# Patient Record
Sex: Female | Born: 1945 | Race: White | Hispanic: No | Marital: Single | Smoking: Never smoker
Health system: Southern US, Community
[De-identification: ages and names within clinical notes are randomized; demographics above are authoritative.]

## PROBLEM LIST (undated history)

## (undated) DIAGNOSIS — N189 Chronic kidney disease, unspecified: Secondary | ICD-10-CM

## (undated) DIAGNOSIS — G709 Myoneural disorder, unspecified: Secondary | ICD-10-CM

## (undated) DIAGNOSIS — I10 Essential (primary) hypertension: Secondary | ICD-10-CM

## (undated) DIAGNOSIS — K219 Gastro-esophageal reflux disease without esophagitis: Secondary | ICD-10-CM

## (undated) DIAGNOSIS — E119 Type 2 diabetes mellitus without complications: Secondary | ICD-10-CM

## (undated) DIAGNOSIS — F32A Depression, unspecified: Secondary | ICD-10-CM

## (undated) HISTORY — PX: CHOLECYSTECTOMY: SHX55

## (undated) HISTORY — PX: TONSILLECTOMY: SUR1361

## (undated) HISTORY — PX: ABDOMINAL HYSTERECTOMY: SHX81

## (undated) HISTORY — PX: DILATION AND CURETTAGE OF UTERUS: SHX78

---

## 1988-10-12 HISTORY — PX: ABDOMINAL HYSTERECTOMY: SHX81

## 2012-02-26 DIAGNOSIS — R072 Precordial pain: Secondary | ICD-10-CM

## 2015-10-22 DIAGNOSIS — K210 Gastro-esophageal reflux disease with esophagitis: Secondary | ICD-10-CM | POA: Diagnosis not present

## 2015-10-22 DIAGNOSIS — N183 Chronic kidney disease, stage 3 (moderate): Secondary | ICD-10-CM | POA: Diagnosis not present

## 2015-10-22 DIAGNOSIS — E1122 Type 2 diabetes mellitus with diabetic chronic kidney disease: Secondary | ICD-10-CM | POA: Diagnosis not present

## 2015-10-22 DIAGNOSIS — E782 Mixed hyperlipidemia: Secondary | ICD-10-CM | POA: Diagnosis not present

## 2015-10-22 DIAGNOSIS — F900 Attention-deficit hyperactivity disorder, predominantly inattentive type: Secondary | ICD-10-CM | POA: Diagnosis not present

## 2015-10-28 DIAGNOSIS — F331 Major depressive disorder, recurrent, moderate: Secondary | ICD-10-CM | POA: Diagnosis not present

## 2015-10-28 DIAGNOSIS — F900 Attention-deficit hyperactivity disorder, predominantly inattentive type: Secondary | ICD-10-CM | POA: Diagnosis not present

## 2015-10-28 DIAGNOSIS — Z9189 Other specified personal risk factors, not elsewhere classified: Secondary | ICD-10-CM | POA: Diagnosis not present

## 2015-10-28 DIAGNOSIS — M797 Fibromyalgia: Secondary | ICD-10-CM | POA: Diagnosis not present

## 2015-10-28 DIAGNOSIS — Z1389 Encounter for screening for other disorder: Secondary | ICD-10-CM | POA: Diagnosis not present

## 2015-10-28 DIAGNOSIS — E6609 Other obesity due to excess calories: Secondary | ICD-10-CM | POA: Diagnosis not present

## 2015-10-28 DIAGNOSIS — N183 Chronic kidney disease, stage 3 (moderate): Secondary | ICD-10-CM | POA: Diagnosis not present

## 2015-10-28 DIAGNOSIS — K219 Gastro-esophageal reflux disease without esophagitis: Secondary | ICD-10-CM | POA: Diagnosis not present

## 2015-10-28 DIAGNOSIS — E1122 Type 2 diabetes mellitus with diabetic chronic kidney disease: Secondary | ICD-10-CM | POA: Diagnosis not present

## 2016-01-22 DIAGNOSIS — K219 Gastro-esophageal reflux disease without esophagitis: Secondary | ICD-10-CM | POA: Diagnosis not present

## 2016-01-22 DIAGNOSIS — F331 Major depressive disorder, recurrent, moderate: Secondary | ICD-10-CM | POA: Diagnosis not present

## 2016-01-22 DIAGNOSIS — R0781 Pleurodynia: Secondary | ICD-10-CM | POA: Diagnosis not present

## 2016-01-22 DIAGNOSIS — M797 Fibromyalgia: Secondary | ICD-10-CM | POA: Diagnosis not present

## 2016-01-29 DIAGNOSIS — L03311 Cellulitis of abdominal wall: Secondary | ICD-10-CM | POA: Diagnosis not present

## 2016-01-29 DIAGNOSIS — F331 Major depressive disorder, recurrent, moderate: Secondary | ICD-10-CM | POA: Diagnosis not present

## 2016-01-29 DIAGNOSIS — S76011A Strain of muscle, fascia and tendon of right hip, initial encounter: Secondary | ICD-10-CM | POA: Diagnosis not present

## 2016-03-02 DIAGNOSIS — E1122 Type 2 diabetes mellitus with diabetic chronic kidney disease: Secondary | ICD-10-CM | POA: Diagnosis not present

## 2016-03-02 DIAGNOSIS — F900 Attention-deficit hyperactivity disorder, predominantly inattentive type: Secondary | ICD-10-CM | POA: Diagnosis not present

## 2016-03-02 DIAGNOSIS — E782 Mixed hyperlipidemia: Secondary | ICD-10-CM | POA: Diagnosis not present

## 2016-03-02 DIAGNOSIS — N183 Chronic kidney disease, stage 3 (moderate): Secondary | ICD-10-CM | POA: Diagnosis not present

## 2016-03-02 DIAGNOSIS — K210 Gastro-esophageal reflux disease with esophagitis: Secondary | ICD-10-CM | POA: Diagnosis not present

## 2016-03-05 DIAGNOSIS — E6609 Other obesity due to excess calories: Secondary | ICD-10-CM | POA: Diagnosis not present

## 2016-03-05 DIAGNOSIS — E1122 Type 2 diabetes mellitus with diabetic chronic kidney disease: Secondary | ICD-10-CM | POA: Diagnosis not present

## 2016-03-05 DIAGNOSIS — Z9189 Other specified personal risk factors, not elsewhere classified: Secondary | ICD-10-CM | POA: Diagnosis not present

## 2016-03-05 DIAGNOSIS — K219 Gastro-esophageal reflux disease without esophagitis: Secondary | ICD-10-CM | POA: Diagnosis not present

## 2016-03-05 DIAGNOSIS — F331 Major depressive disorder, recurrent, moderate: Secondary | ICD-10-CM | POA: Diagnosis not present

## 2016-03-05 DIAGNOSIS — M797 Fibromyalgia: Secondary | ICD-10-CM | POA: Diagnosis not present

## 2016-03-05 DIAGNOSIS — N183 Chronic kidney disease, stage 3 (moderate): Secondary | ICD-10-CM | POA: Diagnosis not present

## 2016-03-05 DIAGNOSIS — F900 Attention-deficit hyperactivity disorder, predominantly inattentive type: Secondary | ICD-10-CM | POA: Diagnosis not present

## 2016-04-23 DIAGNOSIS — M25522 Pain in left elbow: Secondary | ICD-10-CM | POA: Diagnosis not present

## 2016-04-23 DIAGNOSIS — M4692 Unspecified inflammatory spondylopathy, cervical region: Secondary | ICD-10-CM | POA: Diagnosis not present

## 2016-04-23 DIAGNOSIS — M4722 Other spondylosis with radiculopathy, cervical region: Secondary | ICD-10-CM | POA: Diagnosis not present

## 2016-04-23 DIAGNOSIS — M7061 Trochanteric bursitis, right hip: Secondary | ICD-10-CM | POA: Diagnosis not present

## 2016-04-23 DIAGNOSIS — M542 Cervicalgia: Secondary | ICD-10-CM | POA: Diagnosis not present

## 2016-06-11 DIAGNOSIS — Z961 Presence of intraocular lens: Secondary | ICD-10-CM | POA: Diagnosis not present

## 2016-06-11 DIAGNOSIS — E119 Type 2 diabetes mellitus without complications: Secondary | ICD-10-CM | POA: Diagnosis not present

## 2016-06-11 DIAGNOSIS — H538 Other visual disturbances: Secondary | ICD-10-CM | POA: Diagnosis not present

## 2016-07-06 DIAGNOSIS — M797 Fibromyalgia: Secondary | ICD-10-CM | POA: Diagnosis not present

## 2016-07-06 DIAGNOSIS — K210 Gastro-esophageal reflux disease with esophagitis: Secondary | ICD-10-CM | POA: Diagnosis not present

## 2016-07-06 DIAGNOSIS — E782 Mixed hyperlipidemia: Secondary | ICD-10-CM | POA: Diagnosis not present

## 2016-07-06 DIAGNOSIS — E1122 Type 2 diabetes mellitus with diabetic chronic kidney disease: Secondary | ICD-10-CM | POA: Diagnosis not present

## 2016-07-06 DIAGNOSIS — F900 Attention-deficit hyperactivity disorder, predominantly inattentive type: Secondary | ICD-10-CM | POA: Diagnosis not present

## 2016-07-08 DIAGNOSIS — Z6829 Body mass index (BMI) 29.0-29.9, adult: Secondary | ICD-10-CM | POA: Diagnosis not present

## 2016-07-08 DIAGNOSIS — Z1212 Encounter for screening for malignant neoplasm of rectum: Secondary | ICD-10-CM | POA: Diagnosis not present

## 2016-07-08 DIAGNOSIS — E782 Mixed hyperlipidemia: Secondary | ICD-10-CM | POA: Diagnosis not present

## 2016-07-08 DIAGNOSIS — E6609 Other obesity due to excess calories: Secondary | ICD-10-CM | POA: Diagnosis not present

## 2016-07-08 DIAGNOSIS — Z9189 Other specified personal risk factors, not elsewhere classified: Secondary | ICD-10-CM | POA: Diagnosis not present

## 2016-07-08 DIAGNOSIS — F331 Major depressive disorder, recurrent, moderate: Secondary | ICD-10-CM | POA: Diagnosis not present

## 2016-07-08 DIAGNOSIS — I10 Essential (primary) hypertension: Secondary | ICD-10-CM | POA: Diagnosis not present

## 2016-07-08 DIAGNOSIS — Z0001 Encounter for general adult medical examination with abnormal findings: Secondary | ICD-10-CM | POA: Diagnosis not present

## 2016-07-08 DIAGNOSIS — M797 Fibromyalgia: Secondary | ICD-10-CM | POA: Diagnosis not present

## 2016-07-08 DIAGNOSIS — F900 Attention-deficit hyperactivity disorder, predominantly inattentive type: Secondary | ICD-10-CM | POA: Diagnosis not present

## 2016-07-08 DIAGNOSIS — N183 Chronic kidney disease, stage 3 (moderate): Secondary | ICD-10-CM | POA: Diagnosis not present

## 2016-07-08 DIAGNOSIS — Z23 Encounter for immunization: Secondary | ICD-10-CM | POA: Diagnosis not present

## 2016-07-08 DIAGNOSIS — E1122 Type 2 diabetes mellitus with diabetic chronic kidney disease: Secondary | ICD-10-CM | POA: Diagnosis not present

## 2016-07-08 DIAGNOSIS — K219 Gastro-esophageal reflux disease without esophagitis: Secondary | ICD-10-CM | POA: Diagnosis not present

## 2016-07-16 DIAGNOSIS — Z1231 Encounter for screening mammogram for malignant neoplasm of breast: Secondary | ICD-10-CM | POA: Diagnosis not present

## 2016-11-09 DIAGNOSIS — N183 Chronic kidney disease, stage 3 (moderate): Secondary | ICD-10-CM | POA: Diagnosis not present

## 2016-11-09 DIAGNOSIS — E782 Mixed hyperlipidemia: Secondary | ICD-10-CM | POA: Diagnosis not present

## 2016-11-09 DIAGNOSIS — K210 Gastro-esophageal reflux disease with esophagitis: Secondary | ICD-10-CM | POA: Diagnosis not present

## 2016-11-09 DIAGNOSIS — E1165 Type 2 diabetes mellitus with hyperglycemia: Secondary | ICD-10-CM | POA: Diagnosis not present

## 2016-11-09 DIAGNOSIS — I10 Essential (primary) hypertension: Secondary | ICD-10-CM | POA: Diagnosis not present

## 2016-11-11 DIAGNOSIS — E1122 Type 2 diabetes mellitus with diabetic chronic kidney disease: Secondary | ICD-10-CM | POA: Diagnosis not present

## 2016-11-11 DIAGNOSIS — M797 Fibromyalgia: Secondary | ICD-10-CM | POA: Diagnosis not present

## 2016-11-11 DIAGNOSIS — E6609 Other obesity due to excess calories: Secondary | ICD-10-CM | POA: Diagnosis not present

## 2016-11-11 DIAGNOSIS — F331 Major depressive disorder, recurrent, moderate: Secondary | ICD-10-CM | POA: Diagnosis not present

## 2016-11-11 DIAGNOSIS — Z6829 Body mass index (BMI) 29.0-29.9, adult: Secondary | ICD-10-CM | POA: Diagnosis not present

## 2016-11-11 DIAGNOSIS — K219 Gastro-esophageal reflux disease without esophagitis: Secondary | ICD-10-CM | POA: Diagnosis not present

## 2016-11-11 DIAGNOSIS — N183 Chronic kidney disease, stage 3 (moderate): Secondary | ICD-10-CM | POA: Diagnosis not present

## 2016-11-11 DIAGNOSIS — F900 Attention-deficit hyperactivity disorder, predominantly inattentive type: Secondary | ICD-10-CM | POA: Diagnosis not present

## 2017-03-03 DIAGNOSIS — E1165 Type 2 diabetes mellitus with hyperglycemia: Secondary | ICD-10-CM | POA: Diagnosis not present

## 2017-03-03 DIAGNOSIS — F900 Attention-deficit hyperactivity disorder, predominantly inattentive type: Secondary | ICD-10-CM | POA: Diagnosis not present

## 2017-03-03 DIAGNOSIS — E782 Mixed hyperlipidemia: Secondary | ICD-10-CM | POA: Diagnosis not present

## 2017-03-03 DIAGNOSIS — F331 Major depressive disorder, recurrent, moderate: Secondary | ICD-10-CM | POA: Diagnosis not present

## 2017-03-03 DIAGNOSIS — Z9189 Other specified personal risk factors, not elsewhere classified: Secondary | ICD-10-CM | POA: Diagnosis not present

## 2017-03-03 DIAGNOSIS — K210 Gastro-esophageal reflux disease with esophagitis: Secondary | ICD-10-CM | POA: Diagnosis not present

## 2017-03-03 DIAGNOSIS — E1122 Type 2 diabetes mellitus with diabetic chronic kidney disease: Secondary | ICD-10-CM | POA: Diagnosis not present

## 2017-03-03 DIAGNOSIS — I10 Essential (primary) hypertension: Secondary | ICD-10-CM | POA: Diagnosis not present

## 2017-03-09 DIAGNOSIS — N183 Chronic kidney disease, stage 3 (moderate): Secondary | ICD-10-CM | POA: Diagnosis not present

## 2017-03-09 DIAGNOSIS — F331 Major depressive disorder, recurrent, moderate: Secondary | ICD-10-CM | POA: Diagnosis not present

## 2017-03-09 DIAGNOSIS — Z6830 Body mass index (BMI) 30.0-30.9, adult: Secondary | ICD-10-CM | POA: Diagnosis not present

## 2017-03-09 DIAGNOSIS — I10 Essential (primary) hypertension: Secondary | ICD-10-CM | POA: Diagnosis not present

## 2017-03-09 DIAGNOSIS — E782 Mixed hyperlipidemia: Secondary | ICD-10-CM | POA: Diagnosis not present

## 2017-03-09 DIAGNOSIS — M797 Fibromyalgia: Secondary | ICD-10-CM | POA: Diagnosis not present

## 2017-03-09 DIAGNOSIS — E1122 Type 2 diabetes mellitus with diabetic chronic kidney disease: Secondary | ICD-10-CM | POA: Diagnosis not present

## 2017-03-09 DIAGNOSIS — F900 Attention-deficit hyperactivity disorder, predominantly inattentive type: Secondary | ICD-10-CM | POA: Diagnosis not present

## 2017-03-25 DIAGNOSIS — Z6828 Body mass index (BMI) 28.0-28.9, adult: Secondary | ICD-10-CM | POA: Diagnosis not present

## 2017-03-25 DIAGNOSIS — L03311 Cellulitis of abdominal wall: Secondary | ICD-10-CM | POA: Diagnosis not present

## 2017-03-25 DIAGNOSIS — I10 Essential (primary) hypertension: Secondary | ICD-10-CM | POA: Diagnosis not present

## 2017-04-08 DIAGNOSIS — E1122 Type 2 diabetes mellitus with diabetic chronic kidney disease: Secondary | ICD-10-CM | POA: Diagnosis not present

## 2017-04-08 DIAGNOSIS — F331 Major depressive disorder, recurrent, moderate: Secondary | ICD-10-CM | POA: Diagnosis not present

## 2017-04-08 DIAGNOSIS — F900 Attention-deficit hyperactivity disorder, predominantly inattentive type: Secondary | ICD-10-CM | POA: Diagnosis not present

## 2017-04-08 DIAGNOSIS — E782 Mixed hyperlipidemia: Secondary | ICD-10-CM | POA: Diagnosis not present

## 2017-04-08 DIAGNOSIS — N183 Chronic kidney disease, stage 3 (moderate): Secondary | ICD-10-CM | POA: Diagnosis not present

## 2017-04-08 DIAGNOSIS — E1165 Type 2 diabetes mellitus with hyperglycemia: Secondary | ICD-10-CM | POA: Diagnosis not present

## 2017-04-08 DIAGNOSIS — K210 Gastro-esophageal reflux disease with esophagitis: Secondary | ICD-10-CM | POA: Diagnosis not present

## 2017-04-08 DIAGNOSIS — I10 Essential (primary) hypertension: Secondary | ICD-10-CM | POA: Diagnosis not present

## 2017-04-08 DIAGNOSIS — Z9189 Other specified personal risk factors, not elsewhere classified: Secondary | ICD-10-CM | POA: Diagnosis not present

## 2017-04-12 DIAGNOSIS — I10 Essential (primary) hypertension: Secondary | ICD-10-CM | POA: Diagnosis not present

## 2017-04-12 DIAGNOSIS — Z6829 Body mass index (BMI) 29.0-29.9, adult: Secondary | ICD-10-CM | POA: Diagnosis not present

## 2017-04-12 DIAGNOSIS — N183 Chronic kidney disease, stage 3 (moderate): Secondary | ICD-10-CM | POA: Diagnosis not present

## 2017-04-12 DIAGNOSIS — E1122 Type 2 diabetes mellitus with diabetic chronic kidney disease: Secondary | ICD-10-CM | POA: Diagnosis not present

## 2017-04-12 DIAGNOSIS — Z1212 Encounter for screening for malignant neoplasm of rectum: Secondary | ICD-10-CM | POA: Diagnosis not present

## 2017-04-12 DIAGNOSIS — M797 Fibromyalgia: Secondary | ICD-10-CM | POA: Diagnosis not present

## 2017-04-12 DIAGNOSIS — E782 Mixed hyperlipidemia: Secondary | ICD-10-CM | POA: Diagnosis not present

## 2017-04-12 DIAGNOSIS — R41840 Attention and concentration deficit: Secondary | ICD-10-CM | POA: Diagnosis not present

## 2017-07-16 DIAGNOSIS — M25551 Pain in right hip: Secondary | ICD-10-CM | POA: Diagnosis not present

## 2017-07-16 DIAGNOSIS — M545 Low back pain: Secondary | ICD-10-CM | POA: Diagnosis not present

## 2017-08-12 DIAGNOSIS — H40053 Ocular hypertension, bilateral: Secondary | ICD-10-CM | POA: Diagnosis not present

## 2017-08-13 DIAGNOSIS — E782 Mixed hyperlipidemia: Secondary | ICD-10-CM | POA: Diagnosis not present

## 2017-08-13 DIAGNOSIS — K210 Gastro-esophageal reflux disease with esophagitis: Secondary | ICD-10-CM | POA: Diagnosis not present

## 2017-08-13 DIAGNOSIS — E1165 Type 2 diabetes mellitus with hyperglycemia: Secondary | ICD-10-CM | POA: Diagnosis not present

## 2017-08-13 DIAGNOSIS — E1122 Type 2 diabetes mellitus with diabetic chronic kidney disease: Secondary | ICD-10-CM | POA: Diagnosis not present

## 2017-08-13 DIAGNOSIS — Z9189 Other specified personal risk factors, not elsewhere classified: Secondary | ICD-10-CM | POA: Diagnosis not present

## 2017-08-13 DIAGNOSIS — F900 Attention-deficit hyperactivity disorder, predominantly inattentive type: Secondary | ICD-10-CM | POA: Diagnosis not present

## 2017-08-13 DIAGNOSIS — N183 Chronic kidney disease, stage 3 (moderate): Secondary | ICD-10-CM | POA: Diagnosis not present

## 2017-08-13 DIAGNOSIS — I10 Essential (primary) hypertension: Secondary | ICD-10-CM | POA: Diagnosis not present

## 2017-08-18 DIAGNOSIS — Z0001 Encounter for general adult medical examination with abnormal findings: Secondary | ICD-10-CM | POA: Diagnosis not present

## 2017-08-18 DIAGNOSIS — N183 Chronic kidney disease, stage 3 (moderate): Secondary | ICD-10-CM | POA: Diagnosis not present

## 2017-08-18 DIAGNOSIS — E1122 Type 2 diabetes mellitus with diabetic chronic kidney disease: Secondary | ICD-10-CM | POA: Diagnosis not present

## 2017-08-18 DIAGNOSIS — Z1212 Encounter for screening for malignant neoplasm of rectum: Secondary | ICD-10-CM | POA: Diagnosis not present

## 2017-08-18 DIAGNOSIS — Z23 Encounter for immunization: Secondary | ICD-10-CM | POA: Diagnosis not present

## 2017-08-18 DIAGNOSIS — Z6830 Body mass index (BMI) 30.0-30.9, adult: Secondary | ICD-10-CM | POA: Diagnosis not present

## 2017-08-18 DIAGNOSIS — I10 Essential (primary) hypertension: Secondary | ICD-10-CM | POA: Diagnosis not present

## 2017-08-18 DIAGNOSIS — Z1389 Encounter for screening for other disorder: Secondary | ICD-10-CM | POA: Diagnosis not present

## 2017-10-27 ENCOUNTER — Other Ambulatory Visit: Payer: Self-pay

## 2017-10-27 ENCOUNTER — Emergency Department (HOSPITAL_COMMUNITY)
Admission: EM | Admit: 2017-10-27 | Discharge: 2017-10-27 | Disposition: A | Discharge status: Home / Self Care | Payer: PPO | Attending: Emergency Medicine | Admitting: Emergency Medicine

## 2017-10-27 ENCOUNTER — Emergency Department (HOSPITAL_COMMUNITY): Payer: PPO

## 2017-10-27 ENCOUNTER — Encounter (HOSPITAL_COMMUNITY): Payer: Self-pay | Admitting: Emergency Medicine

## 2017-10-27 DIAGNOSIS — E119 Type 2 diabetes mellitus without complications: Secondary | ICD-10-CM | POA: Diagnosis not present

## 2017-10-27 DIAGNOSIS — M545 Low back pain: Secondary | ICD-10-CM | POA: Diagnosis not present

## 2017-10-27 DIAGNOSIS — G8929 Other chronic pain: Secondary | ICD-10-CM | POA: Insufficient documentation

## 2017-10-27 DIAGNOSIS — M549 Dorsalgia, unspecified: Secondary | ICD-10-CM | POA: Diagnosis not present

## 2017-10-27 DIAGNOSIS — Z79899 Other long term (current) drug therapy: Secondary | ICD-10-CM | POA: Diagnosis not present

## 2017-10-27 DIAGNOSIS — Z7984 Long term (current) use of oral hypoglycemic drugs: Secondary | ICD-10-CM | POA: Diagnosis not present

## 2017-10-27 DIAGNOSIS — M5442 Lumbago with sciatica, left side: Secondary | ICD-10-CM | POA: Insufficient documentation

## 2017-10-27 DIAGNOSIS — I10 Essential (primary) hypertension: Secondary | ICD-10-CM | POA: Insufficient documentation

## 2017-10-27 DIAGNOSIS — M25552 Pain in left hip: Secondary | ICD-10-CM | POA: Diagnosis not present

## 2017-10-27 HISTORY — DX: Essential (primary) hypertension: I10

## 2017-10-27 HISTORY — DX: Type 2 diabetes mellitus without complications: E11.9

## 2017-10-27 LAB — CBG MONITORING, ED: Glucose-Capillary: 87 mg/dL (ref 65–99)

## 2017-10-27 MED ORDER — HYDROCODONE-ACETAMINOPHEN 5-325 MG PO TABS
1.0000 | ORAL_TABLET | Freq: Once | ORAL | Status: AC
  Administered 2017-10-27: 1 via ORAL
  Filled 2017-10-27: qty 1

## 2017-10-27 MED ORDER — ONDANSETRON 4 MG PO TBDP
4.0000 mg | ORAL_TABLET | Freq: Once | ORAL | Status: AC
  Administered 2017-10-27: 4 mg via ORAL
  Filled 2017-10-27: qty 1

## 2017-10-27 MED ORDER — CYCLOBENZAPRINE HCL 10 MG PO TABS: 10.0000 mg | ORAL_TABLET | Freq: Two times a day (BID) | ORAL | 0 refills | Status: DC | PRN

## 2017-10-27 MED ORDER — HYDROMORPHONE HCL 1 MG/ML IJ SOLN
0.5000 mg | Freq: Once | INTRAMUSCULAR | Status: AC
  Administered 2017-10-27: 0.5 mg via INTRAMUSCULAR
  Filled 2017-10-27: qty 1

## 2017-10-27 MED ORDER — OXYCODONE-ACETAMINOPHEN 5-325 MG PO TABS: 1.0000 | ORAL_TABLET | Freq: Four times a day (QID) | ORAL | 0 refills | Status: DC | PRN

## 2017-10-27 MED ORDER — HYDROMORPHONE HCL 1 MG/ML IJ SOLN: 1.0000 mg | Freq: Once | INTRAMUSCULAR | Status: DC

## 2017-10-27 NOTE — ED Triage Notes (Signed)
Patient reports L hip, back and leg pain that started 2 days ago. Patient has a history of back and hip pain. No known injury.

## 2017-10-27 NOTE — ED Provider Notes (Signed)
[facility] EMERGENCY DEPARTMENT Provider Note   CSN: [id number] Arrival date & time: 10/27/17  1259     History   Chief Complaint Chief Complaint  Patient presents with  . Back Pain    HPI Erica Cervantes is a 72 y.o. female with DM, htn and occasional low back pain with diagnosis of scoliosis presenting with severe pain in her left lower back with radiation of tingling sensation to her left toes.  She describes simply standing at the kitchen sink washing dishes 2 days ago when she had severe sharp stabbing pain which has not improved. She has been attempting to ambulate around her home bending forward resting on an old walker as she  is unable to stand straight without severe pain and sitting also is very painful, her only comfortable position currently is lying down.  She denies weakness in her legs and has no urinary or fecal incontinence or retention.  She reports seeing Dr. French Ana in [place] several months ago with less intense pain than here and had a steroid injection which relieved her pain until now.   The history is provided by the patient and a relative.    Past Medical History:  Diagnosis Date  . Diabetes mellitus without complication ([place])   . Hypertension     There are no active problems to display for this patient.   Past Surgical History:  Procedure Laterality Date  . ABDOMINAL HYSTERECTOMY    . CHOLECYSTECTOMY    . TONSILLECTOMY      OB History    Gravida Para Term Preterm AB Living   2 2 2          SAB TAB Ectopic Multiple Live Births                   Home Medications    Prior to Admission medications   Medication Sig Start Date End Date Taking? Authorizing Provider  acetaminophen (TYLENOL) 500 MG tablet Take 500 mg by mouth once as needed for mild pain or moderate pain.   Yes [provider]  amphetamine-dextroamphetamine (ADDERALL) 20 MG tablet Take 20 mg by mouth daily. 10/26/17  Yes [provider]  busPIRone (BUSPAR) 30 MG  tablet Take 30 mg by mouth 3 (three) times daily. 08/23/17  Yes [provider]  DULoxetine (CYMBALTA) 60 MG capsule Take 60 mg by mouth daily. 09/25/17  Yes [provider]  folic acid (FOLVITE) 967 MCG tablet Take 400 mcg by mouth daily.   Yes [provider]  glucosamine-chondroitin 500-400 MG tablet Take 1 tablet by mouth 3 (three) times daily.   Yes [provider]  lisinopril-hydrochlorothiazide (PRINZIDE,ZESTORETIC) 20-25 MG tablet Take 0.5 tablets by mouth daily.  09/11/17  Yes [provider]  meloxicam (MOBIC) 15 MG tablet Take 15 mg by mouth daily. 08/10/17  Yes [provider]  metFORMIN (GLUCOPHAGE-XR) 500 MG 24 hr tablet Take 1,000 mg by mouth 2 (two) times daily. 09/11/17  Yes [provider]  Multiple Vitamin (MULTIVITAMIN WITH MINERALS) TABS tablet Take 1 tablet by mouth daily.   Yes [provider]  Multiple Vitamins-Minerals (HAIR/SKIN/NAILS/BIOTIN PO) Take 1 tablet by mouth daily.   Yes [provider]  ranitidine (ZANTAC) 150 MG tablet Take 150 mg by mouth 2 (two) times daily. 09/13/17  Yes [provider]  vitamin C (ASCORBIC ACID) 500 MG tablet Take 500 mg by mouth daily.   Yes [provider]    Family History Family History  Problem  Relation Age of Onset  . Cancer Mother   . Cancer Father   . Heart attack Brother   . Polycystic kidney disease Brother   . Cancer Other     Social History Social History   Tobacco Use  . Smoking status: Never Smoker  . Smokeless tobacco: Never Used  Substance Use Topics  . Alcohol use: No    Frequency: Never  . Drug use: No     Allergies   Sulfa antibiotics; Codeine; Penicillins; Statins; and Neosporin [neomycin-bacitracin zn-polymyx]   Review of Systems Review of Systems  Constitutional: Negative for fever.  Respiratory: Negative for shortness of breath.   Cardiovascular: Negative for chest pain and leg swelling.    Gastrointestinal: Negative for abdominal distention, abdominal pain and constipation.  Genitourinary: Negative for difficulty urinating, dysuria, flank pain, frequency and urgency.  Musculoskeletal: Positive for back pain. Negative for gait problem and joint swelling.  Skin: Negative for rash.  Neurological: Negative for weakness and numbness.       Tingling per Hpi in the left leg.     Physical Exam Updated Vital Signs BP 139/76 (BP Location: Right Arm)   Pulse 73   Temp 97.8 F (36.6 C) (Oral)   Resp 18   Ht 5\' 3"  (1.6 m)   Wt 77.1 kg (170 lb)   SpO2 98%   BMI 30.11 kg/m   Physical Exam  Constitutional: She appears well-developed and well-nourished.  HENT:  Head: Normocephalic.  Eyes: Conjunctivae are normal.  Neck: Normal range of motion. Neck supple.  Cardiovascular: Normal rate and intact distal pulses.  Pedal pulses normal.  Pulmonary/Chest: Effort normal.  Abdominal: Soft. Bowel sounds are normal. She exhibits no distension and no mass.  Musculoskeletal: Normal range of motion. She exhibits no edema.       Lumbar back: She exhibits tenderness. She exhibits no swelling, no edema and no spasm.  Neurological: She is alert. She has normal strength. She displays no atrophy and no tremor. No sensory deficit. Gait normal.  No strength deficit noted in hip and knee flexor and extensor muscle groups.  Ankle flexion and extension intact. Pt able to SLR bilaterally with moderate left posterior back pain.  Sensation to sharp touch in ankles and feet normal, light touch reduced left dorsal foot. Unable to access dtr's , pt unable to sit secondary to pain.  Skin: Skin is warm and dry.  Psychiatric: She has a normal mood and affect.  Nursing note and vitals reviewed.    ED Treatments / Results  Labs (all labs ordered are listed, but only abnormal results are displayed) Labs Reviewed  CBG MONITORING, ED    EKG  EKG Interpretation None       Radiology Dg Lumbar Spine  Complete  Result Date: 10/27/2017 CLINICAL DATA:  Low back pain EXAM: LUMBAR SPINE - COMPLETE 4+ VIEW COMPARISON:  None. FINDINGS: Surgical clips in the right upper quadrant. Five non rib-bearing lumbar type vertebra. Trace anterolisthesis of L5 on S1. Moderate degenerative changes at L3-L4, L4-L5 and L5-S1. Vertebral body heights are within normal limits. IMPRESSION: Moderate degenerative changes.  No acute osseous abnormality. Electronically Signed   By: Donavan Foil M.D.   On: 10/27/2017 17:00   Dg Hip Unilat W Or Wo Pelvis 2-3 Views Left  Result Date: 10/27/2017 CLINICAL DATA:  Left hip pain EXAM: DG HIP (WITH OR WITHOUT PELVIS) 2-3V LEFT COMPARISON:  None. FINDINGS: No fracture or malalignment. Pubic symphysis and rami are intact. Mild degenerative changes. Calcified  pelvic phleboliths IMPRESSION: No acute osseous abnormality.  Mild degenerative changes. Electronically Signed   By: Donavan Foil M.D.   On: 10/27/2017 17:01    Procedures Procedures (including critical care time)  Medications Ordered in ED Medications  HYDROcodone-acetaminophen (NORCO/VICODIN) 5-325 MG per tablet 1 tablet (1 tablet Oral Given 10/27/17 1629)  HYDROmorphone (DILAUDID) injection 0.5 mg (0.5 mg Intramuscular Given 10/27/17 1757)  ondansetron (ZOFRAN-ODT) disintegrating tablet 4 mg (4 mg Oral Given 10/27/17 1756)     Initial Impression / Assessment and Plan / ED Course  I have reviewed the triage vital signs and the nursing notes.  Pertinent labs & imaging results that were available during my care of the patient were reviewed by me and considered in my medical decision making (see chart for details).     Pt was given hydrocodone prior to plain film imaging. No improvement in pain and still unable to sit or stand.  She was given dilaudid 0.5 mg IM.  Will plan MRI to further eval for possible lumbar herniation.    Discussed with PA Janetta Hora who will follow and dispo pt.   Final Clinical Impressions(s) /  ED Diagnoses   Final diagnoses:  Chronic left-sided low back pain with left-sided sciatica    ED Discharge Orders    None       Landis Martins 10/27/17 1818    Milton Ferguson, MD 10/27/17 [phone]

## 2017-10-27 NOTE — Discharge Instructions (Signed)
Take pain medicine as needed.  Take muscle relaxer at bedtime to help you sleep. This medicine makes you drowsy so do not take before driving or work. Do not take with pain medicine Use a heating pad for sore muscles - use for 20 minutes several times a day A referral to neurosurgery has been provided Return for worsening symptoms

## 2017-11-01 DIAGNOSIS — M545 Low back pain: Secondary | ICD-10-CM | POA: Diagnosis not present

## 2017-11-09 DIAGNOSIS — M48062 Spinal stenosis, lumbar region with neurogenic claudication: Secondary | ICD-10-CM | POA: Diagnosis not present

## 2017-11-09 DIAGNOSIS — M4317 Spondylolisthesis, lumbosacral region: Secondary | ICD-10-CM | POA: Diagnosis not present

## 2017-11-09 DIAGNOSIS — Z6829 Body mass index (BMI) 29.0-29.9, adult: Secondary | ICD-10-CM | POA: Diagnosis not present

## 2017-11-09 DIAGNOSIS — M4150 Other secondary scoliosis, site unspecified: Secondary | ICD-10-CM | POA: Diagnosis not present

## 2017-11-23 DIAGNOSIS — M48062 Spinal stenosis, lumbar region with neurogenic claudication: Secondary | ICD-10-CM | POA: Diagnosis not present

## 2017-11-23 DIAGNOSIS — I10 Essential (primary) hypertension: Secondary | ICD-10-CM | POA: Diagnosis not present

## 2017-11-23 DIAGNOSIS — Z6830 Body mass index (BMI) 30.0-30.9, adult: Secondary | ICD-10-CM | POA: Diagnosis not present

## 2017-12-01 DIAGNOSIS — M48062 Spinal stenosis, lumbar region with neurogenic claudication: Secondary | ICD-10-CM | POA: Diagnosis not present

## 2017-12-28 DIAGNOSIS — M48062 Spinal stenosis, lumbar region with neurogenic claudication: Secondary | ICD-10-CM | POA: Diagnosis not present

## 2018-01-06 DIAGNOSIS — M48062 Spinal stenosis, lumbar region with neurogenic claudication: Secondary | ICD-10-CM | POA: Diagnosis not present

## 2018-01-19 DIAGNOSIS — I10 Essential (primary) hypertension: Secondary | ICD-10-CM | POA: Diagnosis not present

## 2018-01-19 DIAGNOSIS — M47812 Spondylosis without myelopathy or radiculopathy, cervical region: Secondary | ICD-10-CM | POA: Diagnosis not present

## 2018-01-19 DIAGNOSIS — Z6829 Body mass index (BMI) 29.0-29.9, adult: Secondary | ICD-10-CM | POA: Diagnosis not present

## 2018-04-11 DIAGNOSIS — Z0001 Encounter for general adult medical examination with abnormal findings: Secondary | ICD-10-CM | POA: Diagnosis not present

## 2018-04-11 DIAGNOSIS — I10 Essential (primary) hypertension: Secondary | ICD-10-CM | POA: Diagnosis not present

## 2018-04-11 DIAGNOSIS — N183 Chronic kidney disease, stage 3 (moderate): Secondary | ICD-10-CM | POA: Diagnosis not present

## 2018-04-11 DIAGNOSIS — E1122 Type 2 diabetes mellitus with diabetic chronic kidney disease: Secondary | ICD-10-CM | POA: Diagnosis not present

## 2018-04-11 DIAGNOSIS — E782 Mixed hyperlipidemia: Secondary | ICD-10-CM | POA: Diagnosis not present

## 2018-04-11 DIAGNOSIS — M797 Fibromyalgia: Secondary | ICD-10-CM | POA: Diagnosis not present

## 2018-04-11 DIAGNOSIS — F900 Attention-deficit hyperactivity disorder, predominantly inattentive type: Secondary | ICD-10-CM | POA: Diagnosis not present

## 2018-04-11 DIAGNOSIS — Z6829 Body mass index (BMI) 29.0-29.9, adult: Secondary | ICD-10-CM | POA: Diagnosis not present

## 2018-04-11 DIAGNOSIS — K219 Gastro-esophageal reflux disease without esophagitis: Secondary | ICD-10-CM | POA: Diagnosis not present

## 2018-05-04 DIAGNOSIS — M48062 Spinal stenosis, lumbar region with neurogenic claudication: Secondary | ICD-10-CM | POA: Diagnosis not present

## 2018-05-04 DIAGNOSIS — Z6829 Body mass index (BMI) 29.0-29.9, adult: Secondary | ICD-10-CM | POA: Diagnosis not present

## 2018-05-04 DIAGNOSIS — I10 Essential (primary) hypertension: Secondary | ICD-10-CM | POA: Diagnosis not present

## 2018-06-16 DIAGNOSIS — Z6828 Body mass index (BMI) 28.0-28.9, adult: Secondary | ICD-10-CM | POA: Diagnosis not present

## 2018-06-16 DIAGNOSIS — M48062 Spinal stenosis, lumbar region with neurogenic claudication: Secondary | ICD-10-CM | POA: Diagnosis not present

## 2018-06-16 DIAGNOSIS — I10 Essential (primary) hypertension: Secondary | ICD-10-CM | POA: Diagnosis not present

## 2018-07-18 DIAGNOSIS — E1122 Type 2 diabetes mellitus with diabetic chronic kidney disease: Secondary | ICD-10-CM | POA: Diagnosis not present

## 2018-07-18 DIAGNOSIS — E782 Mixed hyperlipidemia: Secondary | ICD-10-CM | POA: Diagnosis not present

## 2018-07-18 DIAGNOSIS — I10 Essential (primary) hypertension: Secondary | ICD-10-CM | POA: Diagnosis not present

## 2018-07-18 DIAGNOSIS — K210 Gastro-esophageal reflux disease with esophagitis: Secondary | ICD-10-CM | POA: Diagnosis not present

## 2018-07-18 DIAGNOSIS — N183 Chronic kidney disease, stage 3 (moderate): Secondary | ICD-10-CM | POA: Diagnosis not present

## 2018-07-18 DIAGNOSIS — M797 Fibromyalgia: Secondary | ICD-10-CM | POA: Diagnosis not present

## 2018-07-21 DIAGNOSIS — Z23 Encounter for immunization: Secondary | ICD-10-CM | POA: Diagnosis not present

## 2018-07-21 DIAGNOSIS — N183 Chronic kidney disease, stage 3 (moderate): Secondary | ICD-10-CM | POA: Diagnosis not present

## 2018-07-21 DIAGNOSIS — F900 Attention-deficit hyperactivity disorder, predominantly inattentive type: Secondary | ICD-10-CM | POA: Diagnosis not present

## 2018-07-21 DIAGNOSIS — E782 Mixed hyperlipidemia: Secondary | ICD-10-CM | POA: Diagnosis not present

## 2018-07-21 DIAGNOSIS — Z6829 Body mass index (BMI) 29.0-29.9, adult: Secondary | ICD-10-CM | POA: Diagnosis not present

## 2018-07-21 DIAGNOSIS — E1122 Type 2 diabetes mellitus with diabetic chronic kidney disease: Secondary | ICD-10-CM | POA: Diagnosis not present

## 2018-07-21 DIAGNOSIS — I10 Essential (primary) hypertension: Secondary | ICD-10-CM | POA: Diagnosis not present

## 2018-07-21 DIAGNOSIS — M797 Fibromyalgia: Secondary | ICD-10-CM | POA: Diagnosis not present

## 2018-08-10 DIAGNOSIS — M48062 Spinal stenosis, lumbar region with neurogenic claudication: Secondary | ICD-10-CM | POA: Diagnosis not present

## 2018-08-30 DIAGNOSIS — M25511 Pain in right shoulder: Secondary | ICD-10-CM | POA: Diagnosis not present

## 2018-08-30 DIAGNOSIS — M545 Low back pain: Secondary | ICD-10-CM | POA: Diagnosis not present

## 2018-08-30 DIAGNOSIS — Z6828 Body mass index (BMI) 28.0-28.9, adult: Secondary | ICD-10-CM | POA: Diagnosis not present

## 2018-10-10 DIAGNOSIS — I10 Essential (primary) hypertension: Secondary | ICD-10-CM | POA: Diagnosis not present

## 2018-10-10 DIAGNOSIS — E782 Mixed hyperlipidemia: Secondary | ICD-10-CM | POA: Diagnosis not present

## 2018-10-10 DIAGNOSIS — K219 Gastro-esophageal reflux disease without esophagitis: Secondary | ICD-10-CM | POA: Diagnosis not present

## 2018-10-26 DIAGNOSIS — K210 Gastro-esophageal reflux disease with esophagitis: Secondary | ICD-10-CM | POA: Diagnosis not present

## 2018-10-26 DIAGNOSIS — N183 Chronic kidney disease, stage 3 (moderate): Secondary | ICD-10-CM | POA: Diagnosis not present

## 2018-10-26 DIAGNOSIS — E1165 Type 2 diabetes mellitus with hyperglycemia: Secondary | ICD-10-CM | POA: Diagnosis not present

## 2018-10-26 DIAGNOSIS — E1122 Type 2 diabetes mellitus with diabetic chronic kidney disease: Secondary | ICD-10-CM | POA: Diagnosis not present

## 2018-10-26 DIAGNOSIS — E782 Mixed hyperlipidemia: Secondary | ICD-10-CM | POA: Diagnosis not present

## 2018-10-26 DIAGNOSIS — I10 Essential (primary) hypertension: Secondary | ICD-10-CM | POA: Diagnosis not present

## 2018-11-01 DIAGNOSIS — R413 Other amnesia: Secondary | ICD-10-CM | POA: Diagnosis not present

## 2018-11-01 DIAGNOSIS — Z6829 Body mass index (BMI) 29.0-29.9, adult: Secondary | ICD-10-CM | POA: Diagnosis not present

## 2018-11-01 DIAGNOSIS — E1122 Type 2 diabetes mellitus with diabetic chronic kidney disease: Secondary | ICD-10-CM | POA: Diagnosis not present

## 2018-11-01 DIAGNOSIS — M545 Low back pain: Secondary | ICD-10-CM | POA: Diagnosis not present

## 2018-11-01 DIAGNOSIS — E782 Mixed hyperlipidemia: Secondary | ICD-10-CM | POA: Diagnosis not present

## 2018-11-01 DIAGNOSIS — I10 Essential (primary) hypertension: Secondary | ICD-10-CM | POA: Diagnosis not present

## 2018-11-01 DIAGNOSIS — F900 Attention-deficit hyperactivity disorder, predominantly inattentive type: Secondary | ICD-10-CM | POA: Diagnosis not present

## 2018-11-01 DIAGNOSIS — K219 Gastro-esophageal reflux disease without esophagitis: Secondary | ICD-10-CM | POA: Diagnosis not present

## 2018-11-10 DIAGNOSIS — E1122 Type 2 diabetes mellitus with diabetic chronic kidney disease: Secondary | ICD-10-CM | POA: Diagnosis not present

## 2018-11-10 DIAGNOSIS — I10 Essential (primary) hypertension: Secondary | ICD-10-CM | POA: Diagnosis not present

## 2018-11-10 DIAGNOSIS — F331 Major depressive disorder, recurrent, moderate: Secondary | ICD-10-CM | POA: Diagnosis not present

## 2018-12-09 DIAGNOSIS — I10 Essential (primary) hypertension: Secondary | ICD-10-CM | POA: Diagnosis not present

## 2018-12-09 DIAGNOSIS — E1122 Type 2 diabetes mellitus with diabetic chronic kidney disease: Secondary | ICD-10-CM | POA: Diagnosis not present

## 2019-01-09 DIAGNOSIS — E1122 Type 2 diabetes mellitus with diabetic chronic kidney disease: Secondary | ICD-10-CM | POA: Diagnosis not present

## 2019-01-09 DIAGNOSIS — F331 Major depressive disorder, recurrent, moderate: Secondary | ICD-10-CM | POA: Diagnosis not present

## 2019-01-26 DIAGNOSIS — K210 Gastro-esophageal reflux disease with esophagitis: Secondary | ICD-10-CM | POA: Diagnosis not present

## 2019-01-26 DIAGNOSIS — E782 Mixed hyperlipidemia: Secondary | ICD-10-CM | POA: Diagnosis not present

## 2019-01-26 DIAGNOSIS — M797 Fibromyalgia: Secondary | ICD-10-CM | POA: Diagnosis not present

## 2019-01-26 DIAGNOSIS — I10 Essential (primary) hypertension: Secondary | ICD-10-CM | POA: Diagnosis not present

## 2019-01-26 DIAGNOSIS — N183 Chronic kidney disease, stage 3 (moderate): Secondary | ICD-10-CM | POA: Diagnosis not present

## 2019-01-26 DIAGNOSIS — E1165 Type 2 diabetes mellitus with hyperglycemia: Secondary | ICD-10-CM | POA: Diagnosis not present

## 2019-02-01 DIAGNOSIS — F331 Major depressive disorder, recurrent, moderate: Secondary | ICD-10-CM | POA: Diagnosis not present

## 2019-02-01 DIAGNOSIS — G252 Other specified forms of tremor: Secondary | ICD-10-CM | POA: Diagnosis not present

## 2019-02-01 DIAGNOSIS — I10 Essential (primary) hypertension: Secondary | ICD-10-CM | POA: Diagnosis not present

## 2019-02-01 DIAGNOSIS — E782 Mixed hyperlipidemia: Secondary | ICD-10-CM | POA: Diagnosis not present

## 2019-02-01 DIAGNOSIS — E1122 Type 2 diabetes mellitus with diabetic chronic kidney disease: Secondary | ICD-10-CM | POA: Diagnosis not present

## 2019-02-01 DIAGNOSIS — F900 Attention-deficit hyperactivity disorder, predominantly inattentive type: Secondary | ICD-10-CM | POA: Diagnosis not present

## 2019-02-01 DIAGNOSIS — L280 Lichen simplex chronicus: Secondary | ICD-10-CM | POA: Diagnosis not present

## 2019-02-01 DIAGNOSIS — R413 Other amnesia: Secondary | ICD-10-CM | POA: Diagnosis not present

## 2019-02-09 DIAGNOSIS — E782 Mixed hyperlipidemia: Secondary | ICD-10-CM | POA: Diagnosis not present

## 2019-02-09 DIAGNOSIS — F331 Major depressive disorder, recurrent, moderate: Secondary | ICD-10-CM | POA: Diagnosis not present

## 2019-02-09 DIAGNOSIS — I10 Essential (primary) hypertension: Secondary | ICD-10-CM | POA: Diagnosis not present

## 2019-03-11 DIAGNOSIS — E1122 Type 2 diabetes mellitus with diabetic chronic kidney disease: Secondary | ICD-10-CM | POA: Diagnosis not present

## 2019-03-11 DIAGNOSIS — F331 Major depressive disorder, recurrent, moderate: Secondary | ICD-10-CM | POA: Diagnosis not present

## 2019-04-11 DIAGNOSIS — I10 Essential (primary) hypertension: Secondary | ICD-10-CM | POA: Diagnosis not present

## 2019-04-11 DIAGNOSIS — E785 Hyperlipidemia, unspecified: Secondary | ICD-10-CM | POA: Diagnosis not present

## 2019-04-25 DIAGNOSIS — L280 Lichen simplex chronicus: Secondary | ICD-10-CM | POA: Diagnosis not present

## 2019-04-25 DIAGNOSIS — Z6827 Body mass index (BMI) 27.0-27.9, adult: Secondary | ICD-10-CM | POA: Diagnosis not present

## 2019-04-25 DIAGNOSIS — L03313 Cellulitis of chest wall: Secondary | ICD-10-CM | POA: Diagnosis not present

## 2019-05-12 DIAGNOSIS — I10 Essential (primary) hypertension: Secondary | ICD-10-CM | POA: Diagnosis not present

## 2019-05-12 DIAGNOSIS — E785 Hyperlipidemia, unspecified: Secondary | ICD-10-CM | POA: Diagnosis not present

## 2019-06-12 DIAGNOSIS — E1165 Type 2 diabetes mellitus with hyperglycemia: Secondary | ICD-10-CM | POA: Diagnosis not present

## 2019-06-12 DIAGNOSIS — I10 Essential (primary) hypertension: Secondary | ICD-10-CM | POA: Diagnosis not present

## 2019-06-12 DIAGNOSIS — E782 Mixed hyperlipidemia: Secondary | ICD-10-CM | POA: Diagnosis not present

## 2019-06-30 DIAGNOSIS — M797 Fibromyalgia: Secondary | ICD-10-CM | POA: Diagnosis not present

## 2019-06-30 DIAGNOSIS — K210 Gastro-esophageal reflux disease with esophagitis: Secondary | ICD-10-CM | POA: Diagnosis not present

## 2019-06-30 DIAGNOSIS — E782 Mixed hyperlipidemia: Secondary | ICD-10-CM | POA: Diagnosis not present

## 2019-06-30 DIAGNOSIS — N183 Chronic kidney disease, stage 3 (moderate): Secondary | ICD-10-CM | POA: Diagnosis not present

## 2019-06-30 DIAGNOSIS — I10 Essential (primary) hypertension: Secondary | ICD-10-CM | POA: Diagnosis not present

## 2019-06-30 DIAGNOSIS — E1122 Type 2 diabetes mellitus with diabetic chronic kidney disease: Secondary | ICD-10-CM | POA: Diagnosis not present

## 2019-07-05 DIAGNOSIS — Z1212 Encounter for screening for malignant neoplasm of rectum: Secondary | ICD-10-CM | POA: Diagnosis not present

## 2019-07-05 DIAGNOSIS — L280 Lichen simplex chronicus: Secondary | ICD-10-CM | POA: Diagnosis not present

## 2019-07-05 DIAGNOSIS — F900 Attention-deficit hyperactivity disorder, predominantly inattentive type: Secondary | ICD-10-CM | POA: Diagnosis not present

## 2019-07-05 DIAGNOSIS — E1122 Type 2 diabetes mellitus with diabetic chronic kidney disease: Secondary | ICD-10-CM | POA: Diagnosis not present

## 2019-07-05 DIAGNOSIS — Z6828 Body mass index (BMI) 28.0-28.9, adult: Secondary | ICD-10-CM | POA: Diagnosis not present

## 2019-07-05 DIAGNOSIS — F331 Major depressive disorder, recurrent, moderate: Secondary | ICD-10-CM | POA: Diagnosis not present

## 2019-07-05 DIAGNOSIS — I10 Essential (primary) hypertension: Secondary | ICD-10-CM | POA: Diagnosis not present

## 2019-07-05 DIAGNOSIS — Z0001 Encounter for general adult medical examination with abnormal findings: Secondary | ICD-10-CM | POA: Diagnosis not present

## 2019-07-12 DIAGNOSIS — F331 Major depressive disorder, recurrent, moderate: Secondary | ICD-10-CM | POA: Diagnosis not present

## 2019-07-12 DIAGNOSIS — I10 Essential (primary) hypertension: Secondary | ICD-10-CM | POA: Diagnosis not present

## 2019-07-12 DIAGNOSIS — E1165 Type 2 diabetes mellitus with hyperglycemia: Secondary | ICD-10-CM | POA: Diagnosis not present

## 2019-08-08 DIAGNOSIS — Z7984 Long term (current) use of oral hypoglycemic drugs: Secondary | ICD-10-CM | POA: Diagnosis not present

## 2019-08-08 DIAGNOSIS — H40013 Open angle with borderline findings, low risk, bilateral: Secondary | ICD-10-CM | POA: Diagnosis not present

## 2019-08-08 DIAGNOSIS — H43393 Other vitreous opacities, bilateral: Secondary | ICD-10-CM | POA: Diagnosis not present

## 2019-08-08 DIAGNOSIS — H35373 Puckering of macula, bilateral: Secondary | ICD-10-CM | POA: Diagnosis not present

## 2019-08-08 DIAGNOSIS — H35033 Hypertensive retinopathy, bilateral: Secondary | ICD-10-CM | POA: Diagnosis not present

## 2019-08-08 DIAGNOSIS — H524 Presbyopia: Secondary | ICD-10-CM | POA: Diagnosis not present

## 2019-08-08 DIAGNOSIS — H40053 Ocular hypertension, bilateral: Secondary | ICD-10-CM | POA: Diagnosis not present

## 2019-08-08 DIAGNOSIS — H35371 Puckering of macula, right eye: Secondary | ICD-10-CM | POA: Diagnosis not present

## 2019-08-08 DIAGNOSIS — I10 Essential (primary) hypertension: Secondary | ICD-10-CM | POA: Diagnosis not present

## 2019-08-08 DIAGNOSIS — H40001 Preglaucoma, unspecified, right eye: Secondary | ICD-10-CM | POA: Diagnosis not present

## 2019-08-08 DIAGNOSIS — E119 Type 2 diabetes mellitus without complications: Secondary | ICD-10-CM | POA: Diagnosis not present

## 2019-08-08 DIAGNOSIS — H35031 Hypertensive retinopathy, right eye: Secondary | ICD-10-CM | POA: Diagnosis not present

## 2019-08-11 DIAGNOSIS — E1122 Type 2 diabetes mellitus with diabetic chronic kidney disease: Secondary | ICD-10-CM | POA: Diagnosis not present

## 2019-08-11 DIAGNOSIS — I10 Essential (primary) hypertension: Secondary | ICD-10-CM | POA: Diagnosis not present

## 2019-08-21 DIAGNOSIS — Z1231 Encounter for screening mammogram for malignant neoplasm of breast: Secondary | ICD-10-CM | POA: Diagnosis not present

## 2019-09-01 DIAGNOSIS — Z1159 Encounter for screening for other viral diseases: Secondary | ICD-10-CM | POA: Diagnosis not present

## 2019-09-01 DIAGNOSIS — Z20828 Contact with and (suspected) exposure to other viral communicable diseases: Secondary | ICD-10-CM | POA: Diagnosis not present

## 2019-09-11 DIAGNOSIS — I10 Essential (primary) hypertension: Secondary | ICD-10-CM | POA: Diagnosis not present

## 2019-09-11 DIAGNOSIS — E782 Mixed hyperlipidemia: Secondary | ICD-10-CM | POA: Diagnosis not present

## 2019-10-12 DIAGNOSIS — I10 Essential (primary) hypertension: Secondary | ICD-10-CM | POA: Diagnosis not present

## 2019-10-12 DIAGNOSIS — E782 Mixed hyperlipidemia: Secondary | ICD-10-CM | POA: Diagnosis not present

## 2019-10-12 DIAGNOSIS — F331 Major depressive disorder, recurrent, moderate: Secondary | ICD-10-CM | POA: Diagnosis not present

## 2019-10-26 DIAGNOSIS — I10 Essential (primary) hypertension: Secondary | ICD-10-CM | POA: Diagnosis not present

## 2019-10-26 DIAGNOSIS — N183 Chronic kidney disease, stage 3 unspecified: Secondary | ICD-10-CM | POA: Diagnosis not present

## 2019-10-26 DIAGNOSIS — K2100 Gastro-esophageal reflux disease with esophagitis, without bleeding: Secondary | ICD-10-CM | POA: Diagnosis not present

## 2019-10-26 DIAGNOSIS — E1165 Type 2 diabetes mellitus with hyperglycemia: Secondary | ICD-10-CM | POA: Diagnosis not present

## 2019-10-26 DIAGNOSIS — E782 Mixed hyperlipidemia: Secondary | ICD-10-CM | POA: Diagnosis not present

## 2019-10-30 DIAGNOSIS — Z9189 Other specified personal risk factors, not elsewhere classified: Secondary | ICD-10-CM | POA: Diagnosis not present

## 2019-10-30 DIAGNOSIS — M797 Fibromyalgia: Secondary | ICD-10-CM | POA: Diagnosis not present

## 2019-10-30 DIAGNOSIS — N183 Chronic kidney disease, stage 3 unspecified: Secondary | ICD-10-CM | POA: Diagnosis not present

## 2019-10-30 DIAGNOSIS — Z6828 Body mass index (BMI) 28.0-28.9, adult: Secondary | ICD-10-CM | POA: Diagnosis not present

## 2019-10-30 DIAGNOSIS — Z1212 Encounter for screening for malignant neoplasm of rectum: Secondary | ICD-10-CM | POA: Diagnosis not present

## 2019-10-30 DIAGNOSIS — E6609 Other obesity due to excess calories: Secondary | ICD-10-CM | POA: Diagnosis not present

## 2019-10-30 DIAGNOSIS — F331 Major depressive disorder, recurrent, moderate: Secondary | ICD-10-CM | POA: Diagnosis not present

## 2019-10-30 DIAGNOSIS — R4582 Worries: Secondary | ICD-10-CM | POA: Diagnosis not present

## 2019-10-30 DIAGNOSIS — Z23 Encounter for immunization: Secondary | ICD-10-CM | POA: Diagnosis not present

## 2019-10-30 DIAGNOSIS — K219 Gastro-esophageal reflux disease without esophagitis: Secondary | ICD-10-CM | POA: Diagnosis not present

## 2019-10-30 DIAGNOSIS — E782 Mixed hyperlipidemia: Secondary | ICD-10-CM | POA: Diagnosis not present

## 2019-10-30 DIAGNOSIS — R413 Other amnesia: Secondary | ICD-10-CM | POA: Diagnosis not present

## 2019-10-30 DIAGNOSIS — I10 Essential (primary) hypertension: Secondary | ICD-10-CM | POA: Diagnosis not present

## 2019-10-30 DIAGNOSIS — L280 Lichen simplex chronicus: Secondary | ICD-10-CM | POA: Diagnosis not present

## 2019-10-30 DIAGNOSIS — E1122 Type 2 diabetes mellitus with diabetic chronic kidney disease: Secondary | ICD-10-CM | POA: Diagnosis not present

## 2019-10-30 DIAGNOSIS — F900 Attention-deficit hyperactivity disorder, predominantly inattentive type: Secondary | ICD-10-CM | POA: Diagnosis not present

## 2019-11-10 DIAGNOSIS — I10 Essential (primary) hypertension: Secondary | ICD-10-CM | POA: Diagnosis not present

## 2019-11-10 DIAGNOSIS — N183 Chronic kidney disease, stage 3 unspecified: Secondary | ICD-10-CM | POA: Diagnosis not present

## 2019-12-08 DIAGNOSIS — E7849 Other hyperlipidemia: Secondary | ICD-10-CM | POA: Diagnosis not present

## 2019-12-08 DIAGNOSIS — I10 Essential (primary) hypertension: Secondary | ICD-10-CM | POA: Diagnosis not present

## 2019-12-22 ENCOUNTER — Other Ambulatory Visit: Payer: Self-pay

## 2019-12-22 ENCOUNTER — Emergency Department (HOSPITAL_COMMUNITY)
Admission: EM | Admit: 2019-12-22 | Discharge: 2019-12-22 | Disposition: A | Discharge status: Home / Self Care | Payer: PPO | Attending: Emergency Medicine | Admitting: Emergency Medicine

## 2019-12-22 ENCOUNTER — Encounter (HOSPITAL_COMMUNITY): Payer: Self-pay

## 2019-12-22 DIAGNOSIS — Y93H9 Activity, other involving exterior property and land maintenance, building and construction: Secondary | ICD-10-CM | POA: Insufficient documentation

## 2019-12-22 DIAGNOSIS — Y999 Unspecified external cause status: Secondary | ICD-10-CM | POA: Diagnosis not present

## 2019-12-22 DIAGNOSIS — S46812A Strain of other muscles, fascia and tendons at shoulder and upper arm level, left arm, initial encounter: Secondary | ICD-10-CM | POA: Insufficient documentation

## 2019-12-22 DIAGNOSIS — Z7984 Long term (current) use of oral hypoglycemic drugs: Secondary | ICD-10-CM | POA: Diagnosis not present

## 2019-12-22 DIAGNOSIS — I10 Essential (primary) hypertension: Secondary | ICD-10-CM | POA: Diagnosis not present

## 2019-12-22 DIAGNOSIS — S4990XA Unspecified injury of shoulder and upper arm, unspecified arm, initial encounter: Secondary | ICD-10-CM | POA: Diagnosis present

## 2019-12-22 DIAGNOSIS — S46811A Strain of other muscles, fascia and tendons at shoulder and upper arm level, right arm, initial encounter: Secondary | ICD-10-CM | POA: Insufficient documentation

## 2019-12-22 DIAGNOSIS — T148XXA Other injury of unspecified body region, initial encounter: Secondary | ICD-10-CM

## 2019-12-22 DIAGNOSIS — Z79899 Other long term (current) drug therapy: Secondary | ICD-10-CM | POA: Diagnosis not present

## 2019-12-22 DIAGNOSIS — Y92009 Unspecified place in unspecified non-institutional (private) residence as the place of occurrence of the external cause: Secondary | ICD-10-CM | POA: Insufficient documentation

## 2019-12-22 DIAGNOSIS — E119 Type 2 diabetes mellitus without complications: Secondary | ICD-10-CM | POA: Diagnosis not present

## 2019-12-22 DIAGNOSIS — S46912A Strain of unspecified muscle, fascia and tendon at shoulder and upper arm level, left arm, initial encounter: Secondary | ICD-10-CM | POA: Diagnosis not present

## 2019-12-22 DIAGNOSIS — S46911A Strain of unspecified muscle, fascia and tendon at shoulder and upper arm level, right arm, initial encounter: Secondary | ICD-10-CM | POA: Diagnosis not present

## 2019-12-22 DIAGNOSIS — X58XXXA Exposure to other specified factors, initial encounter: Secondary | ICD-10-CM | POA: Insufficient documentation

## 2019-12-22 NOTE — ED Notes (Signed)
Pt reports she took a meloxicam prior to arrival and her pain has diminished to a 5/10

## 2019-12-22 NOTE — ED Triage Notes (Signed)
Pt has hx of fibromyalgia. Endorses severe joint pain in joints since receiving COVID vaccines. States left shoulder is most painful. Pt states she is not able to tolerate exertion due to pain in joints.

## 2019-12-22 NOTE — Discharge Instructions (Addendum)
Apply ice packs on and off to your shoulders and upper arm.  You may take Tylenol every 4 hours if needed for pain.  Follow-up with your primary doctor for recheck if needed.

## 2019-12-22 NOTE — ED Provider Notes (Signed)
[facility] EMERGENCY DEPARTMENT Provider Note   CSN: [id number] Arrival date & time: 12/22/19  1613     History Chief Complaint  Patient presents with  . Generalized Body Aches    Erica Cervantes is a 74 y.o. female.  HPI      Erica Cervantes is a 74 y.o. female with past medical history of diabetes, fibromyalgia and hypertension, who presents to the Emergency Department complaining of bilateral upper arm pain and bilateral lower extremity weakness.  She states that she received her second Covid vaccine approximately 1 month ago and states she has been experiencing generalized weakness since that time.  Today, she states that she was washing off her patio deck and sweeping it with a broom.  Shortly after that she noticed weakness in her arms and legs.  She states the left arm was worse than right.  She states that she was concerned she may be having a heart attack due to the pain in her left arm.  Left arm pain is reproduced with palpation and with movement.  No pain at rest.  She denies any associated factors, including chest pain, shortness of breath, numbness or weakness of the extremities or edema. No hx of trauma.      Past Medical History:  Diagnosis Date  . Diabetes mellitus without complication ([place])   . Hypertension     There are no problems to display for this patient.   Past Surgical History:  Procedure Laterality Date  . ABDOMINAL HYSTERECTOMY    . CHOLECYSTECTOMY    . TONSILLECTOMY       OB History    Gravida  2   Para  2   Term  2   Preterm      AB      Living        SAB      TAB      Ectopic      Multiple      Live Births              Family History  Problem Relation Age of Onset  . Cancer Mother   . Cancer Father   . Heart attack Brother   . Polycystic kidney disease Brother   . Cancer Other     Social History   Tobacco Use  . Smoking status: Never Smoker  . Smokeless tobacco: Never Used  Substance Use Topics  . Alcohol  use: No  . Drug use: No    Home Medications Prior to Admission medications   Medication Sig Start Date End Date Taking? Authorizing Provider  acetaminophen (TYLENOL) 500 MG tablet Take 500 mg by mouth once as needed for mild pain or moderate pain.    [provider]  amphetamine-dextroamphetamine (ADDERALL) 20 MG tablet Take 20 mg by mouth daily. 10/26/17   [provider]  busPIRone (BUSPAR) 30 MG tablet Take 30 mg by mouth 3 (three) times daily. 08/23/17   [provider]  cyclobenzaprine (FLEXERIL) 10 MG tablet Take 1 tablet (10 mg total) by mouth 2 (two) times daily as needed for muscle spasms. 10/27/17   Recardo Evangelist, PA-C  DULoxetine (CYMBALTA) 60 MG capsule Take 60 mg by mouth daily. 09/25/17   [provider]  folic acid (FOLVITE) 379 MCG tablet Take 400 mcg by mouth daily.    [provider]  glucosamine-chondroitin 500-400 MG tablet Take 1 tablet by mouth 3 (three) times daily.    [provider]  lisinopril-hydrochlorothiazide (PRINZIDE,ZESTORETIC) 20-25 MG tablet Take 0.5 tablets by mouth daily.  09/11/17   [provider]  meloxicam (MOBIC) 15 MG tablet Take 15 mg by mouth daily. 08/10/17   [provider]  metFORMIN (GLUCOPHAGE-XR) 500 MG 24 hr tablet Take 1,000 mg by mouth 2 (two) times daily. 09/11/17   [provider]  Multiple Vitamin (MULTIVITAMIN WITH MINERALS) TABS tablet Take 1 tablet by mouth daily.    [provider]  Multiple Vitamins-Minerals (HAIR/SKIN/NAILS/BIOTIN PO) Take 1 tablet by mouth daily.    [provider]  oxyCODONE-acetaminophen (PERCOCET/ROXICET) 5-325 MG tablet Take 1 tablet by mouth every 6 (six) hours as needed for severe pain. 10/27/17   Recardo Evangelist, PA-C  ranitidine (ZANTAC) 150 MG tablet Take 150 mg by mouth 2 (two) times daily. 09/13/17   [provider]  vitamin C (ASCORBIC ACID) 500 MG tablet Take 500 mg by mouth daily.    [provider]    Allergies    Sulfa antibiotics, Codeine, Penicillins, Statins, and Neosporin [neomycin-bacitracin zn-polymyx]  Review of Systems   Review of Systems  Constitutional: Negative for chills, diaphoresis and fever.  Respiratory: Negative for shortness of breath.   Cardiovascular: Negative for chest pain and leg swelling.  Gastrointestinal: Negative for nausea and vomiting.  Musculoskeletal: Positive for arthralgias and joint swelling. Negative for back pain and neck pain.  Skin: Negative for color change and wound.  Neurological: Positive for weakness. Negative for dizziness, seizures, syncope, numbness and headaches.  Psychiatric/Behavioral: Negative for confusion.    Physical Exam Updated Vital Signs BP 137/81 (BP Location: Right Arm)   Pulse 90   Temp (!) 97.5 F (36.4 C) (Temporal)   Resp 16   Ht 5\' 3"  (1.6 m)   Wt 72.6 kg   SpO2 100%   BMI 28.34 kg/m   Physical Exam Vitals and nursing note reviewed.  Constitutional:      General: She is not in acute distress.    Appearance: Normal appearance. She is not ill-appearing.  HENT:     Head: Atraumatic.  Eyes:     Extraocular Movements: Extraocular movements intact.     Pupils: Pupils are equal, round, and reactive to light.  Cardiovascular:     Rate and Rhythm: Normal rate and regular rhythm.     Pulses: Normal pulses.  Pulmonary:     Effort: Pulmonary effort is normal.     Breath sounds: Normal breath sounds.  Chest:     Chest wall: No tenderness.  Musculoskeletal:        General: Tenderness present. Normal range of motion.     Cervical back: Normal range of motion.     Right lower leg: No edema.     Left lower leg: No edema.     Comments: ttp of the bilateral deltoid and trapezius muscles, left > right.  No rash or edema.    Skin:    General: Skin is warm.     Capillary Refill: Capillary refill takes less than 2 seconds.     Findings: No erythema or rash.  Neurological:     General: No focal  deficit present.     Mental Status: She is alert.     ED Results / Procedures / Treatments   Labs (all labs ordered are listed, but only abnormal results are displayed) Labs Reviewed - No data to display  EKG EKG Interpretation  Date/Time:  Friday December 22 2019 17:37:49 EST Ventricular Rate:  84 PR Interval:  114 QRS Duration: 88 QT Interval:  372 QTC Calculation: 439 R Axis:   56 Text Interpretation: Normal sinus rhythm Normal ECG Confirmed by Madalyn Rob [id number] on 12/22/2019 6:02:06 PM   Radiology No results found.  Procedures Procedures (including critical care time)  Medications Ordered in ED Medications - No data to display  ED Course  I have reviewed the triage vital signs and the nursing notes.  Pertinent labs & imaging results that were available during my care of the patient were reviewed by me and considered in my medical decision making (see chart for details).    MDM Rules/Calculators/A&P                      Pt with upper extremity pain after washing off her patio earlier today.  Sx's are reproducible to movement and palpation and resolve at rest.  Likely musculoskeletal.  NV intact.  No associated sx's to suggest ACS, doubtful that current sx's are related to her recent covid vaccine.   Pt reassured.     Pt also seen by Dr. Roslynn Amble and care plan discussed.     Final Clinical Impression(s) / ED Diagnoses Final diagnoses:  Muscle strain    Rx / DC Orders ED Discharge Orders    None       Kem Parkinson, PA-C 12/22/19 1811    Lucrezia Starch, MD 12/26/19 2357

## 2019-12-30 DIAGNOSIS — I10 Essential (primary) hypertension: Secondary | ICD-10-CM | POA: Diagnosis not present

## 2019-12-30 DIAGNOSIS — M797 Fibromyalgia: Secondary | ICD-10-CM | POA: Diagnosis not present

## 2019-12-30 DIAGNOSIS — Z7984 Long term (current) use of oral hypoglycemic drugs: Secondary | ICD-10-CM | POA: Diagnosis not present

## 2019-12-30 DIAGNOSIS — E782 Mixed hyperlipidemia: Secondary | ICD-10-CM | POA: Diagnosis not present

## 2019-12-30 DIAGNOSIS — K219 Gastro-esophageal reflux disease without esophagitis: Secondary | ICD-10-CM | POA: Diagnosis not present

## 2019-12-30 DIAGNOSIS — M6281 Muscle weakness (generalized): Secondary | ICD-10-CM | POA: Diagnosis not present

## 2019-12-30 DIAGNOSIS — Z9181 History of falling: Secondary | ICD-10-CM | POA: Diagnosis not present

## 2019-12-30 DIAGNOSIS — F329 Major depressive disorder, single episode, unspecified: Secondary | ICD-10-CM | POA: Diagnosis not present

## 2019-12-30 DIAGNOSIS — M1990 Unspecified osteoarthritis, unspecified site: Secondary | ICD-10-CM | POA: Diagnosis not present

## 2019-12-30 DIAGNOSIS — F909 Attention-deficit hyperactivity disorder, unspecified type: Secondary | ICD-10-CM | POA: Diagnosis not present

## 2019-12-30 DIAGNOSIS — E1165 Type 2 diabetes mellitus with hyperglycemia: Secondary | ICD-10-CM | POA: Diagnosis not present

## 2020-01-03 DIAGNOSIS — E1122 Type 2 diabetes mellitus with diabetic chronic kidney disease: Secondary | ICD-10-CM | POA: Diagnosis not present

## 2020-01-03 DIAGNOSIS — F331 Major depressive disorder, recurrent, moderate: Secondary | ICD-10-CM | POA: Diagnosis not present

## 2020-01-03 DIAGNOSIS — M25512 Pain in left shoulder: Secondary | ICD-10-CM | POA: Diagnosis not present

## 2020-01-03 DIAGNOSIS — I10 Essential (primary) hypertension: Secondary | ICD-10-CM | POA: Diagnosis not present

## 2020-01-03 DIAGNOSIS — M25552 Pain in left hip: Secondary | ICD-10-CM | POA: Diagnosis not present

## 2020-01-03 DIAGNOSIS — F900 Attention-deficit hyperactivity disorder, predominantly inattentive type: Secondary | ICD-10-CM | POA: Diagnosis not present

## 2020-01-03 DIAGNOSIS — L280 Lichen simplex chronicus: Secondary | ICD-10-CM | POA: Diagnosis not present

## 2020-01-03 DIAGNOSIS — R413 Other amnesia: Secondary | ICD-10-CM | POA: Diagnosis not present

## 2020-01-10 DIAGNOSIS — E7849 Other hyperlipidemia: Secondary | ICD-10-CM | POA: Diagnosis not present

## 2020-01-10 DIAGNOSIS — I10 Essential (primary) hypertension: Secondary | ICD-10-CM | POA: Diagnosis not present

## 2020-01-10 DIAGNOSIS — E1165 Type 2 diabetes mellitus with hyperglycemia: Secondary | ICD-10-CM | POA: Diagnosis not present

## 2020-01-11 DIAGNOSIS — M1990 Unspecified osteoarthritis, unspecified site: Secondary | ICD-10-CM | POA: Diagnosis not present

## 2020-01-11 DIAGNOSIS — I10 Essential (primary) hypertension: Secondary | ICD-10-CM | POA: Diagnosis not present

## 2020-01-11 DIAGNOSIS — K219 Gastro-esophageal reflux disease without esophagitis: Secondary | ICD-10-CM | POA: Diagnosis not present

## 2020-01-11 DIAGNOSIS — E1165 Type 2 diabetes mellitus with hyperglycemia: Secondary | ICD-10-CM | POA: Diagnosis not present

## 2020-01-11 DIAGNOSIS — M6281 Muscle weakness (generalized): Secondary | ICD-10-CM | POA: Diagnosis not present

## 2020-01-11 DIAGNOSIS — Z9181 History of falling: Secondary | ICD-10-CM | POA: Diagnosis not present

## 2020-01-11 DIAGNOSIS — M797 Fibromyalgia: Secondary | ICD-10-CM | POA: Diagnosis not present

## 2020-01-11 DIAGNOSIS — E782 Mixed hyperlipidemia: Secondary | ICD-10-CM | POA: Diagnosis not present

## 2020-01-11 DIAGNOSIS — F329 Major depressive disorder, single episode, unspecified: Secondary | ICD-10-CM | POA: Diagnosis not present

## 2020-01-11 DIAGNOSIS — Z7984 Long term (current) use of oral hypoglycemic drugs: Secondary | ICD-10-CM | POA: Diagnosis not present

## 2020-01-11 DIAGNOSIS — F909 Attention-deficit hyperactivity disorder, unspecified type: Secondary | ICD-10-CM | POA: Diagnosis not present

## 2020-01-22 DIAGNOSIS — F329 Major depressive disorder, single episode, unspecified: Secondary | ICD-10-CM | POA: Diagnosis not present

## 2020-01-22 DIAGNOSIS — E1165 Type 2 diabetes mellitus with hyperglycemia: Secondary | ICD-10-CM | POA: Diagnosis not present

## 2020-01-22 DIAGNOSIS — M6281 Muscle weakness (generalized): Secondary | ICD-10-CM | POA: Diagnosis not present

## 2020-01-22 DIAGNOSIS — F909 Attention-deficit hyperactivity disorder, unspecified type: Secondary | ICD-10-CM | POA: Diagnosis not present

## 2020-01-22 DIAGNOSIS — M797 Fibromyalgia: Secondary | ICD-10-CM | POA: Diagnosis not present

## 2020-01-22 DIAGNOSIS — K219 Gastro-esophageal reflux disease without esophagitis: Secondary | ICD-10-CM | POA: Diagnosis not present

## 2020-01-22 DIAGNOSIS — I10 Essential (primary) hypertension: Secondary | ICD-10-CM | POA: Diagnosis not present

## 2020-01-22 DIAGNOSIS — E782 Mixed hyperlipidemia: Secondary | ICD-10-CM | POA: Diagnosis not present

## 2020-01-23 DIAGNOSIS — K2100 Gastro-esophageal reflux disease with esophagitis, without bleeding: Secondary | ICD-10-CM | POA: Diagnosis not present

## 2020-01-23 DIAGNOSIS — E782 Mixed hyperlipidemia: Secondary | ICD-10-CM | POA: Diagnosis not present

## 2020-01-23 DIAGNOSIS — E1165 Type 2 diabetes mellitus with hyperglycemia: Secondary | ICD-10-CM | POA: Diagnosis not present

## 2020-01-23 DIAGNOSIS — I10 Essential (primary) hypertension: Secondary | ICD-10-CM | POA: Diagnosis not present

## 2020-01-23 DIAGNOSIS — M797 Fibromyalgia: Secondary | ICD-10-CM | POA: Diagnosis not present

## 2020-01-23 DIAGNOSIS — N183 Chronic kidney disease, stage 3 unspecified: Secondary | ICD-10-CM | POA: Diagnosis not present

## 2020-01-23 DIAGNOSIS — E1122 Type 2 diabetes mellitus with diabetic chronic kidney disease: Secondary | ICD-10-CM | POA: Diagnosis not present

## 2020-01-30 DIAGNOSIS — F900 Attention-deficit hyperactivity disorder, predominantly inattentive type: Secondary | ICD-10-CM | POA: Diagnosis not present

## 2020-01-30 DIAGNOSIS — R4582 Worries: Secondary | ICD-10-CM | POA: Diagnosis not present

## 2020-01-30 DIAGNOSIS — R413 Other amnesia: Secondary | ICD-10-CM | POA: Diagnosis not present

## 2020-01-30 DIAGNOSIS — N183 Chronic kidney disease, stage 3 unspecified: Secondary | ICD-10-CM | POA: Diagnosis not present

## 2020-01-30 DIAGNOSIS — L280 Lichen simplex chronicus: Secondary | ICD-10-CM | POA: Diagnosis not present

## 2020-01-30 DIAGNOSIS — K219 Gastro-esophageal reflux disease without esophagitis: Secondary | ICD-10-CM | POA: Diagnosis not present

## 2020-01-30 DIAGNOSIS — E782 Mixed hyperlipidemia: Secondary | ICD-10-CM | POA: Diagnosis not present

## 2020-01-30 DIAGNOSIS — E6609 Other obesity due to excess calories: Secondary | ICD-10-CM | POA: Diagnosis not present

## 2020-01-30 DIAGNOSIS — M797 Fibromyalgia: Secondary | ICD-10-CM | POA: Diagnosis not present

## 2020-01-30 DIAGNOSIS — M545 Low back pain: Secondary | ICD-10-CM | POA: Diagnosis not present

## 2020-01-30 DIAGNOSIS — F331 Major depressive disorder, recurrent, moderate: Secondary | ICD-10-CM | POA: Diagnosis not present

## 2020-01-30 DIAGNOSIS — Z23 Encounter for immunization: Secondary | ICD-10-CM | POA: Diagnosis not present

## 2020-01-30 DIAGNOSIS — I10 Essential (primary) hypertension: Secondary | ICD-10-CM | POA: Diagnosis not present

## 2020-01-30 DIAGNOSIS — E1122 Type 2 diabetes mellitus with diabetic chronic kidney disease: Secondary | ICD-10-CM | POA: Diagnosis not present

## 2020-01-30 DIAGNOSIS — Z9189 Other specified personal risk factors, not elsewhere classified: Secondary | ICD-10-CM | POA: Diagnosis not present

## 2020-02-02 DIAGNOSIS — L03211 Cellulitis of face: Secondary | ICD-10-CM | POA: Diagnosis not present

## 2020-02-02 DIAGNOSIS — Z6829 Body mass index (BMI) 29.0-29.9, adult: Secondary | ICD-10-CM | POA: Diagnosis not present

## 2020-02-02 DIAGNOSIS — L0201 Cutaneous abscess of face: Secondary | ICD-10-CM | POA: Diagnosis not present

## 2020-02-03 DIAGNOSIS — K219 Gastro-esophageal reflux disease without esophagitis: Secondary | ICD-10-CM | POA: Diagnosis not present

## 2020-02-03 DIAGNOSIS — L0201 Cutaneous abscess of face: Secondary | ICD-10-CM | POA: Diagnosis not present

## 2020-02-03 DIAGNOSIS — Z6828 Body mass index (BMI) 28.0-28.9, adult: Secondary | ICD-10-CM | POA: Diagnosis not present

## 2020-02-03 DIAGNOSIS — L03211 Cellulitis of face: Secondary | ICD-10-CM | POA: Diagnosis not present

## 2020-02-03 DIAGNOSIS — I10 Essential (primary) hypertension: Secondary | ICD-10-CM | POA: Diagnosis not present

## 2020-02-03 DIAGNOSIS — Z79899 Other long term (current) drug therapy: Secondary | ICD-10-CM | POA: Diagnosis not present

## 2020-02-03 DIAGNOSIS — M797 Fibromyalgia: Secondary | ICD-10-CM | POA: Diagnosis not present

## 2020-02-03 DIAGNOSIS — Z7984 Long term (current) use of oral hypoglycemic drugs: Secondary | ICD-10-CM | POA: Diagnosis not present

## 2020-02-03 DIAGNOSIS — R220 Localized swelling, mass and lump, head: Secondary | ICD-10-CM | POA: Diagnosis not present

## 2020-02-03 DIAGNOSIS — E785 Hyperlipidemia, unspecified: Secondary | ICD-10-CM | POA: Diagnosis not present

## 2020-02-03 DIAGNOSIS — R7303 Prediabetes: Secondary | ICD-10-CM | POA: Diagnosis not present

## 2020-02-09 DIAGNOSIS — E1165 Type 2 diabetes mellitus with hyperglycemia: Secondary | ICD-10-CM | POA: Diagnosis not present

## 2020-02-09 DIAGNOSIS — I10 Essential (primary) hypertension: Secondary | ICD-10-CM | POA: Diagnosis not present

## 2020-02-10 DIAGNOSIS — E1165 Type 2 diabetes mellitus with hyperglycemia: Secondary | ICD-10-CM | POA: Diagnosis not present

## 2020-02-10 DIAGNOSIS — Z7984 Long term (current) use of oral hypoglycemic drugs: Secondary | ICD-10-CM | POA: Diagnosis not present

## 2020-02-10 DIAGNOSIS — F909 Attention-deficit hyperactivity disorder, unspecified type: Secondary | ICD-10-CM | POA: Diagnosis not present

## 2020-02-10 DIAGNOSIS — Z9181 History of falling: Secondary | ICD-10-CM | POA: Diagnosis not present

## 2020-02-10 DIAGNOSIS — M6281 Muscle weakness (generalized): Secondary | ICD-10-CM | POA: Diagnosis not present

## 2020-02-10 DIAGNOSIS — I10 Essential (primary) hypertension: Secondary | ICD-10-CM | POA: Diagnosis not present

## 2020-02-10 DIAGNOSIS — M1990 Unspecified osteoarthritis, unspecified site: Secondary | ICD-10-CM | POA: Diagnosis not present

## 2020-02-10 DIAGNOSIS — M797 Fibromyalgia: Secondary | ICD-10-CM | POA: Diagnosis not present

## 2020-02-10 DIAGNOSIS — E782 Mixed hyperlipidemia: Secondary | ICD-10-CM | POA: Diagnosis not present

## 2020-02-10 DIAGNOSIS — F329 Major depressive disorder, single episode, unspecified: Secondary | ICD-10-CM | POA: Diagnosis not present

## 2020-02-10 DIAGNOSIS — K219 Gastro-esophageal reflux disease without esophagitis: Secondary | ICD-10-CM | POA: Diagnosis not present

## 2020-02-28 DIAGNOSIS — M48062 Spinal stenosis, lumbar region with neurogenic claudication: Secondary | ICD-10-CM | POA: Diagnosis not present

## 2020-03-11 DIAGNOSIS — E1122 Type 2 diabetes mellitus with diabetic chronic kidney disease: Secondary | ICD-10-CM | POA: Diagnosis not present

## 2020-03-11 DIAGNOSIS — E7849 Other hyperlipidemia: Secondary | ICD-10-CM | POA: Diagnosis not present

## 2020-03-11 DIAGNOSIS — N183 Chronic kidney disease, stage 3 unspecified: Secondary | ICD-10-CM | POA: Diagnosis not present

## 2020-03-11 DIAGNOSIS — I129 Hypertensive chronic kidney disease with stage 1 through stage 4 chronic kidney disease, or unspecified chronic kidney disease: Secondary | ICD-10-CM | POA: Diagnosis not present

## 2020-04-05 DIAGNOSIS — F900 Attention-deficit hyperactivity disorder, predominantly inattentive type: Secondary | ICD-10-CM | POA: Diagnosis not present

## 2020-04-05 DIAGNOSIS — F331 Major depressive disorder, recurrent, moderate: Secondary | ICD-10-CM | POA: Diagnosis not present

## 2020-04-05 DIAGNOSIS — Z1331 Encounter for screening for depression: Secondary | ICD-10-CM | POA: Diagnosis not present

## 2020-04-05 DIAGNOSIS — Z1389 Encounter for screening for other disorder: Secondary | ICD-10-CM | POA: Diagnosis not present

## 2020-04-05 DIAGNOSIS — R4582 Worries: Secondary | ICD-10-CM | POA: Diagnosis not present

## 2020-04-05 DIAGNOSIS — E782 Mixed hyperlipidemia: Secondary | ICD-10-CM | POA: Diagnosis not present

## 2020-04-05 DIAGNOSIS — E1122 Type 2 diabetes mellitus with diabetic chronic kidney disease: Secondary | ICD-10-CM | POA: Diagnosis not present

## 2020-04-05 DIAGNOSIS — I10 Essential (primary) hypertension: Secondary | ICD-10-CM | POA: Diagnosis not present

## 2020-04-10 DIAGNOSIS — E7849 Other hyperlipidemia: Secondary | ICD-10-CM | POA: Diagnosis not present

## 2020-04-10 DIAGNOSIS — E1122 Type 2 diabetes mellitus with diabetic chronic kidney disease: Secondary | ICD-10-CM | POA: Diagnosis not present

## 2020-04-10 DIAGNOSIS — I129 Hypertensive chronic kidney disease with stage 1 through stage 4 chronic kidney disease, or unspecified chronic kidney disease: Secondary | ICD-10-CM | POA: Diagnosis not present

## 2020-04-10 DIAGNOSIS — N183 Chronic kidney disease, stage 3 unspecified: Secondary | ICD-10-CM | POA: Diagnosis not present

## 2020-04-25 DIAGNOSIS — L03211 Cellulitis of face: Secondary | ICD-10-CM | POA: Diagnosis not present

## 2020-04-25 DIAGNOSIS — F331 Major depressive disorder, recurrent, moderate: Secondary | ICD-10-CM | POA: Diagnosis not present

## 2020-05-08 DIAGNOSIS — E782 Mixed hyperlipidemia: Secondary | ICD-10-CM | POA: Diagnosis not present

## 2020-05-08 DIAGNOSIS — L280 Lichen simplex chronicus: Secondary | ICD-10-CM | POA: Diagnosis not present

## 2020-05-08 DIAGNOSIS — F331 Major depressive disorder, recurrent, moderate: Secondary | ICD-10-CM | POA: Diagnosis not present

## 2020-05-08 DIAGNOSIS — K219 Gastro-esophageal reflux disease without esophagitis: Secondary | ICD-10-CM | POA: Diagnosis not present

## 2020-05-08 DIAGNOSIS — E1122 Type 2 diabetes mellitus with diabetic chronic kidney disease: Secondary | ICD-10-CM | POA: Diagnosis not present

## 2020-05-08 DIAGNOSIS — F900 Attention-deficit hyperactivity disorder, predominantly inattentive type: Secondary | ICD-10-CM | POA: Diagnosis not present

## 2020-05-08 DIAGNOSIS — I10 Essential (primary) hypertension: Secondary | ICD-10-CM | POA: Diagnosis not present

## 2020-05-08 DIAGNOSIS — Z6827 Body mass index (BMI) 27.0-27.9, adult: Secondary | ICD-10-CM | POA: Diagnosis not present

## 2020-05-10 DIAGNOSIS — I129 Hypertensive chronic kidney disease with stage 1 through stage 4 chronic kidney disease, or unspecified chronic kidney disease: Secondary | ICD-10-CM | POA: Diagnosis not present

## 2020-05-10 DIAGNOSIS — N183 Chronic kidney disease, stage 3 unspecified: Secondary | ICD-10-CM | POA: Diagnosis not present

## 2020-05-10 DIAGNOSIS — E7849 Other hyperlipidemia: Secondary | ICD-10-CM | POA: Diagnosis not present

## 2020-05-10 DIAGNOSIS — E1122 Type 2 diabetes mellitus with diabetic chronic kidney disease: Secondary | ICD-10-CM | POA: Diagnosis not present

## 2020-06-11 DIAGNOSIS — E7849 Other hyperlipidemia: Secondary | ICD-10-CM | POA: Diagnosis not present

## 2020-06-11 DIAGNOSIS — I129 Hypertensive chronic kidney disease with stage 1 through stage 4 chronic kidney disease, or unspecified chronic kidney disease: Secondary | ICD-10-CM | POA: Diagnosis not present

## 2020-06-11 DIAGNOSIS — E1122 Type 2 diabetes mellitus with diabetic chronic kidney disease: Secondary | ICD-10-CM | POA: Diagnosis not present

## 2020-06-11 DIAGNOSIS — N183 Chronic kidney disease, stage 3 unspecified: Secondary | ICD-10-CM | POA: Diagnosis not present

## 2020-06-19 DIAGNOSIS — Z0001 Encounter for general adult medical examination with abnormal findings: Secondary | ICD-10-CM | POA: Diagnosis not present

## 2020-06-19 DIAGNOSIS — E559 Vitamin D deficiency, unspecified: Secondary | ICD-10-CM | POA: Diagnosis not present

## 2020-06-19 DIAGNOSIS — F900 Attention-deficit hyperactivity disorder, predominantly inattentive type: Secondary | ICD-10-CM | POA: Diagnosis not present

## 2020-06-19 DIAGNOSIS — K2100 Gastro-esophageal reflux disease with esophagitis, without bleeding: Secondary | ICD-10-CM | POA: Diagnosis not present

## 2020-06-19 DIAGNOSIS — E1122 Type 2 diabetes mellitus with diabetic chronic kidney disease: Secondary | ICD-10-CM | POA: Diagnosis not present

## 2020-06-19 DIAGNOSIS — Z20828 Contact with and (suspected) exposure to other viral communicable diseases: Secondary | ICD-10-CM | POA: Diagnosis not present

## 2020-06-19 DIAGNOSIS — E782 Mixed hyperlipidemia: Secondary | ICD-10-CM | POA: Diagnosis not present

## 2020-06-19 DIAGNOSIS — N183 Chronic kidney disease, stage 3 unspecified: Secondary | ICD-10-CM | POA: Diagnosis not present

## 2020-06-19 DIAGNOSIS — E1165 Type 2 diabetes mellitus with hyperglycemia: Secondary | ICD-10-CM | POA: Diagnosis not present

## 2020-06-19 DIAGNOSIS — R5381 Other malaise: Secondary | ICD-10-CM | POA: Diagnosis not present

## 2020-06-19 DIAGNOSIS — I10 Essential (primary) hypertension: Secondary | ICD-10-CM | POA: Diagnosis not present

## 2020-06-19 DIAGNOSIS — M797 Fibromyalgia: Secondary | ICD-10-CM | POA: Diagnosis not present

## 2020-06-24 DIAGNOSIS — Z0001 Encounter for general adult medical examination with abnormal findings: Secondary | ICD-10-CM | POA: Diagnosis not present

## 2020-06-24 DIAGNOSIS — E782 Mixed hyperlipidemia: Secondary | ICD-10-CM | POA: Diagnosis not present

## 2020-06-24 DIAGNOSIS — N183 Chronic kidney disease, stage 3 unspecified: Secondary | ICD-10-CM | POA: Diagnosis not present

## 2020-06-24 DIAGNOSIS — Z6827 Body mass index (BMI) 27.0-27.9, adult: Secondary | ICD-10-CM | POA: Diagnosis not present

## 2020-06-24 DIAGNOSIS — M797 Fibromyalgia: Secondary | ICD-10-CM | POA: Diagnosis not present

## 2020-06-24 DIAGNOSIS — I10 Essential (primary) hypertension: Secondary | ICD-10-CM | POA: Diagnosis not present

## 2020-06-24 DIAGNOSIS — E1165 Type 2 diabetes mellitus with hyperglycemia: Secondary | ICD-10-CM | POA: Diagnosis not present

## 2020-06-26 DIAGNOSIS — I10 Essential (primary) hypertension: Secondary | ICD-10-CM | POA: Diagnosis not present

## 2020-06-26 DIAGNOSIS — R4582 Worries: Secondary | ICD-10-CM | POA: Diagnosis not present

## 2020-06-26 DIAGNOSIS — F900 Attention-deficit hyperactivity disorder, predominantly inattentive type: Secondary | ICD-10-CM | POA: Diagnosis not present

## 2020-06-26 DIAGNOSIS — Z0001 Encounter for general adult medical examination with abnormal findings: Secondary | ICD-10-CM | POA: Diagnosis not present

## 2020-06-26 DIAGNOSIS — L280 Lichen simplex chronicus: Secondary | ICD-10-CM | POA: Diagnosis not present

## 2020-06-26 DIAGNOSIS — E782 Mixed hyperlipidemia: Secondary | ICD-10-CM | POA: Diagnosis not present

## 2020-06-26 DIAGNOSIS — E1122 Type 2 diabetes mellitus with diabetic chronic kidney disease: Secondary | ICD-10-CM | POA: Diagnosis not present

## 2020-06-26 DIAGNOSIS — Z1212 Encounter for screening for malignant neoplasm of rectum: Secondary | ICD-10-CM | POA: Diagnosis not present

## 2020-07-11 DIAGNOSIS — I129 Hypertensive chronic kidney disease with stage 1 through stage 4 chronic kidney disease, or unspecified chronic kidney disease: Secondary | ICD-10-CM | POA: Diagnosis not present

## 2020-07-11 DIAGNOSIS — E7849 Other hyperlipidemia: Secondary | ICD-10-CM | POA: Diagnosis not present

## 2020-07-11 DIAGNOSIS — N183 Chronic kidney disease, stage 3 unspecified: Secondary | ICD-10-CM | POA: Diagnosis not present

## 2020-07-11 DIAGNOSIS — E1122 Type 2 diabetes mellitus with diabetic chronic kidney disease: Secondary | ICD-10-CM | POA: Diagnosis not present

## 2020-07-30 DIAGNOSIS — I10 Essential (primary) hypertension: Secondary | ICD-10-CM | POA: Diagnosis not present

## 2020-07-30 DIAGNOSIS — M48062 Spinal stenosis, lumbar region with neurogenic claudication: Secondary | ICD-10-CM | POA: Diagnosis not present

## 2020-07-30 DIAGNOSIS — Z6826 Body mass index (BMI) 26.0-26.9, adult: Secondary | ICD-10-CM | POA: Diagnosis not present

## 2020-08-10 DIAGNOSIS — N183 Chronic kidney disease, stage 3 unspecified: Secondary | ICD-10-CM | POA: Diagnosis not present

## 2020-08-10 DIAGNOSIS — E1122 Type 2 diabetes mellitus with diabetic chronic kidney disease: Secondary | ICD-10-CM | POA: Diagnosis not present

## 2020-08-10 DIAGNOSIS — E7849 Other hyperlipidemia: Secondary | ICD-10-CM | POA: Diagnosis not present

## 2020-08-10 DIAGNOSIS — I129 Hypertensive chronic kidney disease with stage 1 through stage 4 chronic kidney disease, or unspecified chronic kidney disease: Secondary | ICD-10-CM | POA: Diagnosis not present

## 2020-08-22 DIAGNOSIS — Z6828 Body mass index (BMI) 28.0-28.9, adult: Secondary | ICD-10-CM | POA: Diagnosis not present

## 2020-08-22 DIAGNOSIS — R109 Unspecified abdominal pain: Secondary | ICD-10-CM | POA: Diagnosis not present

## 2020-08-22 DIAGNOSIS — M1612 Unilateral primary osteoarthritis, left hip: Secondary | ICD-10-CM | POA: Diagnosis not present

## 2020-08-22 DIAGNOSIS — M1611 Unilateral primary osteoarthritis, right hip: Secondary | ICD-10-CM | POA: Diagnosis not present

## 2020-08-22 DIAGNOSIS — M25569 Pain in unspecified knee: Secondary | ICD-10-CM | POA: Diagnosis not present

## 2020-08-22 DIAGNOSIS — R519 Headache, unspecified: Secondary | ICD-10-CM | POA: Diagnosis not present

## 2020-09-10 DIAGNOSIS — E1122 Type 2 diabetes mellitus with diabetic chronic kidney disease: Secondary | ICD-10-CM | POA: Diagnosis not present

## 2020-09-10 DIAGNOSIS — I129 Hypertensive chronic kidney disease with stage 1 through stage 4 chronic kidney disease, or unspecified chronic kidney disease: Secondary | ICD-10-CM | POA: Diagnosis not present

## 2020-09-10 DIAGNOSIS — N183 Chronic kidney disease, stage 3 unspecified: Secondary | ICD-10-CM | POA: Diagnosis not present

## 2020-09-20 DIAGNOSIS — K219 Gastro-esophageal reflux disease without esophagitis: Secondary | ICD-10-CM | POA: Diagnosis not present

## 2020-09-20 DIAGNOSIS — Z1159 Encounter for screening for other viral diseases: Secondary | ICD-10-CM | POA: Diagnosis not present

## 2020-09-20 DIAGNOSIS — E1165 Type 2 diabetes mellitus with hyperglycemia: Secondary | ICD-10-CM | POA: Diagnosis not present

## 2020-09-20 DIAGNOSIS — I10 Essential (primary) hypertension: Secondary | ICD-10-CM | POA: Diagnosis not present

## 2020-09-20 DIAGNOSIS — E1122 Type 2 diabetes mellitus with diabetic chronic kidney disease: Secondary | ICD-10-CM | POA: Diagnosis not present

## 2020-09-20 DIAGNOSIS — N183 Chronic kidney disease, stage 3 unspecified: Secondary | ICD-10-CM | POA: Diagnosis not present

## 2020-09-20 DIAGNOSIS — E782 Mixed hyperlipidemia: Secondary | ICD-10-CM | POA: Diagnosis not present

## 2020-09-20 DIAGNOSIS — F900 Attention-deficit hyperactivity disorder, predominantly inattentive type: Secondary | ICD-10-CM | POA: Diagnosis not present

## 2020-09-24 DIAGNOSIS — F900 Attention-deficit hyperactivity disorder, predominantly inattentive type: Secondary | ICD-10-CM | POA: Diagnosis not present

## 2020-09-24 DIAGNOSIS — Z7189 Other specified counseling: Secondary | ICD-10-CM | POA: Diagnosis not present

## 2020-09-24 DIAGNOSIS — F331 Major depressive disorder, recurrent, moderate: Secondary | ICD-10-CM | POA: Diagnosis not present

## 2020-09-24 DIAGNOSIS — E1122 Type 2 diabetes mellitus with diabetic chronic kidney disease: Secondary | ICD-10-CM | POA: Diagnosis not present

## 2020-09-24 DIAGNOSIS — I10 Essential (primary) hypertension: Secondary | ICD-10-CM | POA: Diagnosis not present

## 2020-09-24 DIAGNOSIS — E782 Mixed hyperlipidemia: Secondary | ICD-10-CM | POA: Diagnosis not present

## 2020-09-24 DIAGNOSIS — N1831 Chronic kidney disease, stage 3a: Secondary | ICD-10-CM | POA: Diagnosis not present

## 2020-09-24 DIAGNOSIS — L280 Lichen simplex chronicus: Secondary | ICD-10-CM | POA: Diagnosis not present

## 2020-09-26 DIAGNOSIS — M25552 Pain in left hip: Secondary | ICD-10-CM | POA: Diagnosis not present

## 2020-09-26 DIAGNOSIS — M542 Cervicalgia: Secondary | ICD-10-CM | POA: Diagnosis not present

## 2020-09-26 DIAGNOSIS — M48062 Spinal stenosis, lumbar region with neurogenic claudication: Secondary | ICD-10-CM | POA: Diagnosis not present

## 2020-09-26 DIAGNOSIS — M47812 Spondylosis without myelopathy or radiculopathy, cervical region: Secondary | ICD-10-CM | POA: Diagnosis not present

## 2020-09-30 DIAGNOSIS — M810 Age-related osteoporosis without current pathological fracture: Secondary | ICD-10-CM | POA: Diagnosis not present

## 2020-09-30 DIAGNOSIS — Z1231 Encounter for screening mammogram for malignant neoplasm of breast: Secondary | ICD-10-CM | POA: Diagnosis not present

## 2020-10-09 DIAGNOSIS — M47816 Spondylosis without myelopathy or radiculopathy, lumbar region: Secondary | ICD-10-CM | POA: Diagnosis not present

## 2020-10-09 DIAGNOSIS — Z6826 Body mass index (BMI) 26.0-26.9, adult: Secondary | ICD-10-CM | POA: Diagnosis not present

## 2020-10-09 DIAGNOSIS — I10 Essential (primary) hypertension: Secondary | ICD-10-CM | POA: Diagnosis not present

## 2020-10-11 DIAGNOSIS — N183 Chronic kidney disease, stage 3 unspecified: Secondary | ICD-10-CM | POA: Diagnosis not present

## 2020-10-11 DIAGNOSIS — E1122 Type 2 diabetes mellitus with diabetic chronic kidney disease: Secondary | ICD-10-CM | POA: Diagnosis not present

## 2020-10-11 DIAGNOSIS — I129 Hypertensive chronic kidney disease with stage 1 through stage 4 chronic kidney disease, or unspecified chronic kidney disease: Secondary | ICD-10-CM | POA: Diagnosis not present

## 2020-11-09 DIAGNOSIS — E1122 Type 2 diabetes mellitus with diabetic chronic kidney disease: Secondary | ICD-10-CM | POA: Diagnosis not present

## 2020-11-09 DIAGNOSIS — E7849 Other hyperlipidemia: Secondary | ICD-10-CM | POA: Diagnosis not present

## 2020-11-09 DIAGNOSIS — N183 Chronic kidney disease, stage 3 unspecified: Secondary | ICD-10-CM | POA: Diagnosis not present

## 2020-11-09 DIAGNOSIS — I129 Hypertensive chronic kidney disease with stage 1 through stage 4 chronic kidney disease, or unspecified chronic kidney disease: Secondary | ICD-10-CM | POA: Diagnosis not present

## 2020-11-26 DIAGNOSIS — L609 Nail disorder, unspecified: Secondary | ICD-10-CM | POA: Diagnosis not present

## 2020-11-26 DIAGNOSIS — Z6826 Body mass index (BMI) 26.0-26.9, adult: Secondary | ICD-10-CM | POA: Diagnosis not present

## 2020-12-09 DIAGNOSIS — E7849 Other hyperlipidemia: Secondary | ICD-10-CM | POA: Diagnosis not present

## 2020-12-09 DIAGNOSIS — I129 Hypertensive chronic kidney disease with stage 1 through stage 4 chronic kidney disease, or unspecified chronic kidney disease: Secondary | ICD-10-CM | POA: Diagnosis not present

## 2020-12-09 DIAGNOSIS — N183 Chronic kidney disease, stage 3 unspecified: Secondary | ICD-10-CM | POA: Diagnosis not present

## 2020-12-09 DIAGNOSIS — E1122 Type 2 diabetes mellitus with diabetic chronic kidney disease: Secondary | ICD-10-CM | POA: Diagnosis not present

## 2020-12-11 DIAGNOSIS — L03211 Cellulitis of face: Secondary | ICD-10-CM | POA: Diagnosis not present

## 2020-12-11 DIAGNOSIS — M25512 Pain in left shoulder: Secondary | ICD-10-CM | POA: Diagnosis not present

## 2020-12-11 DIAGNOSIS — L280 Lichen simplex chronicus: Secondary | ICD-10-CM | POA: Diagnosis not present

## 2020-12-11 DIAGNOSIS — E1122 Type 2 diabetes mellitus with diabetic chronic kidney disease: Secondary | ICD-10-CM | POA: Diagnosis not present

## 2020-12-11 DIAGNOSIS — M545 Low back pain, unspecified: Secondary | ICD-10-CM | POA: Diagnosis not present

## 2020-12-23 DIAGNOSIS — K759 Inflammatory liver disease, unspecified: Secondary | ICD-10-CM | POA: Diagnosis not present

## 2020-12-23 DIAGNOSIS — I10 Essential (primary) hypertension: Secondary | ICD-10-CM | POA: Diagnosis not present

## 2020-12-23 DIAGNOSIS — E1122 Type 2 diabetes mellitus with diabetic chronic kidney disease: Secondary | ICD-10-CM | POA: Diagnosis not present

## 2020-12-23 DIAGNOSIS — K2100 Gastro-esophageal reflux disease with esophagitis, without bleeding: Secondary | ICD-10-CM | POA: Diagnosis not present

## 2020-12-23 DIAGNOSIS — Z1329 Encounter for screening for other suspected endocrine disorder: Secondary | ICD-10-CM | POA: Diagnosis not present

## 2020-12-23 DIAGNOSIS — E782 Mixed hyperlipidemia: Secondary | ICD-10-CM | POA: Diagnosis not present

## 2020-12-23 DIAGNOSIS — N183 Chronic kidney disease, stage 3 unspecified: Secondary | ICD-10-CM | POA: Diagnosis not present

## 2020-12-23 DIAGNOSIS — E7849 Other hyperlipidemia: Secondary | ICD-10-CM | POA: Diagnosis not present

## 2020-12-23 DIAGNOSIS — E1165 Type 2 diabetes mellitus with hyperglycemia: Secondary | ICD-10-CM | POA: Diagnosis not present

## 2020-12-26 DIAGNOSIS — E7849 Other hyperlipidemia: Secondary | ICD-10-CM | POA: Diagnosis not present

## 2020-12-26 DIAGNOSIS — R4582 Worries: Secondary | ICD-10-CM | POA: Diagnosis not present

## 2020-12-26 DIAGNOSIS — L280 Lichen simplex chronicus: Secondary | ICD-10-CM | POA: Diagnosis not present

## 2020-12-26 DIAGNOSIS — R413 Other amnesia: Secondary | ICD-10-CM | POA: Diagnosis not present

## 2020-12-26 DIAGNOSIS — I10 Essential (primary) hypertension: Secondary | ICD-10-CM | POA: Diagnosis not present

## 2020-12-26 DIAGNOSIS — E1122 Type 2 diabetes mellitus with diabetic chronic kidney disease: Secondary | ICD-10-CM | POA: Diagnosis not present

## 2020-12-26 DIAGNOSIS — F900 Attention-deficit hyperactivity disorder, predominantly inattentive type: Secondary | ICD-10-CM | POA: Diagnosis not present

## 2020-12-26 DIAGNOSIS — K2100 Gastro-esophageal reflux disease with esophagitis, without bleeding: Secondary | ICD-10-CM | POA: Diagnosis not present

## 2020-12-30 DIAGNOSIS — Z9181 History of falling: Secondary | ICD-10-CM | POA: Diagnosis not present

## 2020-12-30 DIAGNOSIS — M545 Low back pain, unspecified: Secondary | ICD-10-CM | POA: Diagnosis not present

## 2020-12-30 DIAGNOSIS — M810 Age-related osteoporosis without current pathological fracture: Secondary | ICD-10-CM | POA: Diagnosis not present

## 2020-12-30 DIAGNOSIS — R2681 Unsteadiness on feet: Secondary | ICD-10-CM | POA: Diagnosis not present

## 2020-12-30 DIAGNOSIS — M85851 Other specified disorders of bone density and structure, right thigh: Secondary | ICD-10-CM | POA: Diagnosis not present

## 2020-12-30 DIAGNOSIS — Z780 Asymptomatic menopausal state: Secondary | ICD-10-CM | POA: Diagnosis not present

## 2021-01-01 DIAGNOSIS — M545 Low back pain, unspecified: Secondary | ICD-10-CM | POA: Diagnosis not present

## 2021-01-01 DIAGNOSIS — Z9181 History of falling: Secondary | ICD-10-CM | POA: Diagnosis not present

## 2021-01-01 DIAGNOSIS — R2681 Unsteadiness on feet: Secondary | ICD-10-CM | POA: Diagnosis not present

## 2021-01-06 DIAGNOSIS — M545 Low back pain, unspecified: Secondary | ICD-10-CM | POA: Diagnosis not present

## 2021-01-06 DIAGNOSIS — Z9181 History of falling: Secondary | ICD-10-CM | POA: Diagnosis not present

## 2021-01-06 DIAGNOSIS — R2681 Unsteadiness on feet: Secondary | ICD-10-CM | POA: Diagnosis not present

## 2021-01-08 DIAGNOSIS — Z9181 History of falling: Secondary | ICD-10-CM | POA: Diagnosis not present

## 2021-01-08 DIAGNOSIS — R2681 Unsteadiness on feet: Secondary | ICD-10-CM | POA: Diagnosis not present

## 2021-01-08 DIAGNOSIS — E1122 Type 2 diabetes mellitus with diabetic chronic kidney disease: Secondary | ICD-10-CM | POA: Diagnosis not present

## 2021-01-08 DIAGNOSIS — I129 Hypertensive chronic kidney disease with stage 1 through stage 4 chronic kidney disease, or unspecified chronic kidney disease: Secondary | ICD-10-CM | POA: Diagnosis not present

## 2021-01-08 DIAGNOSIS — M25519 Pain in unspecified shoulder: Secondary | ICD-10-CM | POA: Diagnosis not present

## 2021-01-08 DIAGNOSIS — M545 Low back pain, unspecified: Secondary | ICD-10-CM | POA: Diagnosis not present

## 2021-01-08 DIAGNOSIS — N183 Chronic kidney disease, stage 3 unspecified: Secondary | ICD-10-CM | POA: Diagnosis not present

## 2021-01-08 DIAGNOSIS — E7849 Other hyperlipidemia: Secondary | ICD-10-CM | POA: Diagnosis not present

## 2021-01-13 DIAGNOSIS — R2681 Unsteadiness on feet: Secondary | ICD-10-CM | POA: Diagnosis not present

## 2021-01-13 DIAGNOSIS — M545 Low back pain, unspecified: Secondary | ICD-10-CM | POA: Diagnosis not present

## 2021-01-13 DIAGNOSIS — Z9181 History of falling: Secondary | ICD-10-CM | POA: Diagnosis not present

## 2021-01-15 DIAGNOSIS — R2681 Unsteadiness on feet: Secondary | ICD-10-CM | POA: Diagnosis not present

## 2021-01-15 DIAGNOSIS — Z9181 History of falling: Secondary | ICD-10-CM | POA: Diagnosis not present

## 2021-01-15 DIAGNOSIS — M545 Low back pain, unspecified: Secondary | ICD-10-CM | POA: Diagnosis not present

## 2021-01-20 ENCOUNTER — Other Ambulatory Visit (HOSPITAL_COMMUNITY): Payer: Self-pay | Admitting: Neurosurgery

## 2021-01-20 DIAGNOSIS — M48062 Spinal stenosis, lumbar region with neurogenic claudication: Secondary | ICD-10-CM

## 2021-01-20 DIAGNOSIS — M47812 Spondylosis without myelopathy or radiculopathy, cervical region: Secondary | ICD-10-CM | POA: Diagnosis not present

## 2021-01-29 DIAGNOSIS — R2681 Unsteadiness on feet: Secondary | ICD-10-CM | POA: Diagnosis not present

## 2021-01-29 DIAGNOSIS — Z9181 History of falling: Secondary | ICD-10-CM | POA: Diagnosis not present

## 2021-01-29 DIAGNOSIS — M545 Low back pain, unspecified: Secondary | ICD-10-CM | POA: Diagnosis not present

## 2021-02-03 DIAGNOSIS — M25519 Pain in unspecified shoulder: Secondary | ICD-10-CM | POA: Diagnosis not present

## 2021-02-03 DIAGNOSIS — R2681 Unsteadiness on feet: Secondary | ICD-10-CM | POA: Diagnosis not present

## 2021-02-03 DIAGNOSIS — M545 Low back pain, unspecified: Secondary | ICD-10-CM | POA: Diagnosis not present

## 2021-02-03 DIAGNOSIS — Z9181 History of falling: Secondary | ICD-10-CM | POA: Diagnosis not present

## 2021-02-06 ENCOUNTER — Ambulatory Visit (HOSPITAL_COMMUNITY)
Admission: RE | Admit: 2021-02-06 | Discharge: 2021-02-06 | Disposition: A | Discharge status: Home / Self Care | Payer: HMO | Source: Ambulatory Visit | Attending: Neurosurgery | Admitting: Neurosurgery

## 2021-02-06 DIAGNOSIS — M48062 Spinal stenosis, lumbar region with neurogenic claudication: Secondary | ICD-10-CM | POA: Insufficient documentation

## 2021-02-06 DIAGNOSIS — M5116 Intervertebral disc disorders with radiculopathy, lumbar region: Secondary | ICD-10-CM | POA: Diagnosis not present

## 2021-02-06 DIAGNOSIS — M5117 Intervertebral disc disorders with radiculopathy, lumbosacral region: Secondary | ICD-10-CM | POA: Diagnosis not present

## 2021-02-06 DIAGNOSIS — M48061 Spinal stenosis, lumbar region without neurogenic claudication: Secondary | ICD-10-CM | POA: Diagnosis not present

## 2021-02-06 DIAGNOSIS — M4319 Spondylolisthesis, multiple sites in spine: Secondary | ICD-10-CM | POA: Diagnosis not present

## 2021-02-07 DIAGNOSIS — M545 Low back pain, unspecified: Secondary | ICD-10-CM | POA: Diagnosis not present

## 2021-02-07 DIAGNOSIS — M25519 Pain in unspecified shoulder: Secondary | ICD-10-CM | POA: Diagnosis not present

## 2021-02-07 DIAGNOSIS — R2681 Unsteadiness on feet: Secondary | ICD-10-CM | POA: Diagnosis not present

## 2021-02-07 DIAGNOSIS — Z9181 History of falling: Secondary | ICD-10-CM | POA: Diagnosis not present

## 2021-02-08 DIAGNOSIS — N183 Chronic kidney disease, stage 3 unspecified: Secondary | ICD-10-CM | POA: Diagnosis not present

## 2021-02-08 DIAGNOSIS — E1122 Type 2 diabetes mellitus with diabetic chronic kidney disease: Secondary | ICD-10-CM | POA: Diagnosis not present

## 2021-02-08 DIAGNOSIS — I129 Hypertensive chronic kidney disease with stage 1 through stage 4 chronic kidney disease, or unspecified chronic kidney disease: Secondary | ICD-10-CM | POA: Diagnosis not present

## 2021-02-08 DIAGNOSIS — E7849 Other hyperlipidemia: Secondary | ICD-10-CM | POA: Diagnosis not present

## 2021-02-10 DIAGNOSIS — M48062 Spinal stenosis, lumbar region with neurogenic claudication: Secondary | ICD-10-CM | POA: Diagnosis not present

## 2021-02-10 DIAGNOSIS — M4316 Spondylolisthesis, lumbar region: Secondary | ICD-10-CM | POA: Diagnosis not present

## 2021-02-10 DIAGNOSIS — M47812 Spondylosis without myelopathy or radiculopathy, cervical region: Secondary | ICD-10-CM | POA: Diagnosis not present

## 2021-02-10 DIAGNOSIS — M4312 Spondylolisthesis, cervical region: Secondary | ICD-10-CM | POA: Diagnosis not present

## 2021-02-10 DIAGNOSIS — M25512 Pain in left shoulder: Secondary | ICD-10-CM | POA: Diagnosis not present

## 2021-02-12 DIAGNOSIS — R2681 Unsteadiness on feet: Secondary | ICD-10-CM | POA: Diagnosis not present

## 2021-02-12 DIAGNOSIS — Z9181 History of falling: Secondary | ICD-10-CM | POA: Diagnosis not present

## 2021-02-12 DIAGNOSIS — M545 Low back pain, unspecified: Secondary | ICD-10-CM | POA: Diagnosis not present

## 2021-02-12 DIAGNOSIS — M25519 Pain in unspecified shoulder: Secondary | ICD-10-CM | POA: Diagnosis not present

## 2021-02-17 DIAGNOSIS — L0100 Impetigo, unspecified: Secondary | ICD-10-CM | POA: Diagnosis not present

## 2021-02-17 DIAGNOSIS — L280 Lichen simplex chronicus: Secondary | ICD-10-CM | POA: Diagnosis not present

## 2021-02-17 DIAGNOSIS — D485 Neoplasm of uncertain behavior of skin: Secondary | ICD-10-CM | POA: Diagnosis not present

## 2021-02-17 DIAGNOSIS — L281 Prurigo nodularis: Secondary | ICD-10-CM | POA: Diagnosis not present

## 2021-02-17 DIAGNOSIS — L859 Epidermal thickening, unspecified: Secondary | ICD-10-CM | POA: Diagnosis not present

## 2021-02-18 DIAGNOSIS — M25519 Pain in unspecified shoulder: Secondary | ICD-10-CM | POA: Diagnosis not present

## 2021-02-18 DIAGNOSIS — R2681 Unsteadiness on feet: Secondary | ICD-10-CM | POA: Diagnosis not present

## 2021-02-18 DIAGNOSIS — M545 Low back pain, unspecified: Secondary | ICD-10-CM | POA: Diagnosis not present

## 2021-02-18 DIAGNOSIS — Z9181 History of falling: Secondary | ICD-10-CM | POA: Diagnosis not present

## 2021-02-20 DIAGNOSIS — M50222 Other cervical disc displacement at C5-C6 level: Secondary | ICD-10-CM | POA: Diagnosis not present

## 2021-02-20 DIAGNOSIS — M4802 Spinal stenosis, cervical region: Secondary | ICD-10-CM | POA: Diagnosis not present

## 2021-02-20 DIAGNOSIS — M5021 Other cervical disc displacement,  high cervical region: Secondary | ICD-10-CM | POA: Diagnosis not present

## 2021-02-20 DIAGNOSIS — M9971 Connective tissue and disc stenosis of intervertebral foramina of cervical region: Secondary | ICD-10-CM | POA: Diagnosis not present

## 2021-02-20 DIAGNOSIS — M47812 Spondylosis without myelopathy or radiculopathy, cervical region: Secondary | ICD-10-CM | POA: Diagnosis not present

## 2021-02-21 DIAGNOSIS — Z9181 History of falling: Secondary | ICD-10-CM | POA: Diagnosis not present

## 2021-02-21 DIAGNOSIS — R2681 Unsteadiness on feet: Secondary | ICD-10-CM | POA: Diagnosis not present

## 2021-02-21 DIAGNOSIS — M545 Low back pain, unspecified: Secondary | ICD-10-CM | POA: Diagnosis not present

## 2021-02-21 DIAGNOSIS — M25519 Pain in unspecified shoulder: Secondary | ICD-10-CM | POA: Diagnosis not present

## 2021-02-24 DIAGNOSIS — L280 Lichen simplex chronicus: Secondary | ICD-10-CM | POA: Diagnosis not present

## 2021-02-24 DIAGNOSIS — L0100 Impetigo, unspecified: Secondary | ICD-10-CM | POA: Diagnosis not present

## 2021-03-06 DIAGNOSIS — I10 Essential (primary) hypertension: Secondary | ICD-10-CM | POA: Diagnosis not present

## 2021-03-06 DIAGNOSIS — Z6829 Body mass index (BMI) 29.0-29.9, adult: Secondary | ICD-10-CM | POA: Diagnosis not present

## 2021-03-06 DIAGNOSIS — M25512 Pain in left shoulder: Secondary | ICD-10-CM | POA: Diagnosis not present

## 2021-03-10 DIAGNOSIS — N183 Chronic kidney disease, stage 3 unspecified: Secondary | ICD-10-CM | POA: Diagnosis not present

## 2021-03-10 DIAGNOSIS — E1122 Type 2 diabetes mellitus with diabetic chronic kidney disease: Secondary | ICD-10-CM | POA: Diagnosis not present

## 2021-03-10 DIAGNOSIS — I129 Hypertensive chronic kidney disease with stage 1 through stage 4 chronic kidney disease, or unspecified chronic kidney disease: Secondary | ICD-10-CM | POA: Diagnosis not present

## 2021-03-10 DIAGNOSIS — E7849 Other hyperlipidemia: Secondary | ICD-10-CM | POA: Diagnosis not present

## 2021-03-24 DIAGNOSIS — L280 Lichen simplex chronicus: Secondary | ICD-10-CM | POA: Diagnosis not present

## 2021-03-24 DIAGNOSIS — L0100 Impetigo, unspecified: Secondary | ICD-10-CM | POA: Diagnosis not present

## 2021-03-24 DIAGNOSIS — L570 Actinic keratosis: Secondary | ICD-10-CM | POA: Diagnosis not present

## 2021-03-26 DIAGNOSIS — M75122 Complete rotator cuff tear or rupture of left shoulder, not specified as traumatic: Secondary | ICD-10-CM | POA: Diagnosis not present

## 2021-03-31 DIAGNOSIS — E1122 Type 2 diabetes mellitus with diabetic chronic kidney disease: Secondary | ICD-10-CM | POA: Diagnosis not present

## 2021-03-31 DIAGNOSIS — E7849 Other hyperlipidemia: Secondary | ICD-10-CM | POA: Diagnosis not present

## 2021-03-31 DIAGNOSIS — K2100 Gastro-esophageal reflux disease with esophagitis, without bleeding: Secondary | ICD-10-CM | POA: Diagnosis not present

## 2021-03-31 DIAGNOSIS — Z1329 Encounter for screening for other suspected endocrine disorder: Secondary | ICD-10-CM | POA: Diagnosis not present

## 2021-03-31 DIAGNOSIS — E782 Mixed hyperlipidemia: Secondary | ICD-10-CM | POA: Diagnosis not present

## 2021-03-31 DIAGNOSIS — N1831 Chronic kidney disease, stage 3a: Secondary | ICD-10-CM | POA: Diagnosis not present

## 2021-04-03 DIAGNOSIS — R413 Other amnesia: Secondary | ICD-10-CM | POA: Diagnosis not present

## 2021-04-03 DIAGNOSIS — L280 Lichen simplex chronicus: Secondary | ICD-10-CM | POA: Diagnosis not present

## 2021-04-03 DIAGNOSIS — I10 Essential (primary) hypertension: Secondary | ICD-10-CM | POA: Diagnosis not present

## 2021-04-03 DIAGNOSIS — E1122 Type 2 diabetes mellitus with diabetic chronic kidney disease: Secondary | ICD-10-CM | POA: Diagnosis not present

## 2021-04-03 DIAGNOSIS — E7849 Other hyperlipidemia: Secondary | ICD-10-CM | POA: Diagnosis not present

## 2021-04-03 DIAGNOSIS — R4582 Worries: Secondary | ICD-10-CM | POA: Diagnosis not present

## 2021-04-03 DIAGNOSIS — F331 Major depressive disorder, recurrent, moderate: Secondary | ICD-10-CM | POA: Diagnosis not present

## 2021-04-03 DIAGNOSIS — F900 Attention-deficit hyperactivity disorder, predominantly inattentive type: Secondary | ICD-10-CM | POA: Diagnosis not present

## 2021-04-04 DIAGNOSIS — M25519 Pain in unspecified shoulder: Secondary | ICD-10-CM | POA: Diagnosis not present

## 2021-04-04 DIAGNOSIS — M6281 Muscle weakness (generalized): Secondary | ICD-10-CM | POA: Diagnosis not present

## 2021-04-08 DIAGNOSIS — M25519 Pain in unspecified shoulder: Secondary | ICD-10-CM | POA: Diagnosis not present

## 2021-04-08 DIAGNOSIS — M6281 Muscle weakness (generalized): Secondary | ICD-10-CM | POA: Diagnosis not present

## 2021-04-10 DIAGNOSIS — N183 Chronic kidney disease, stage 3 unspecified: Secondary | ICD-10-CM | POA: Diagnosis not present

## 2021-04-10 DIAGNOSIS — I129 Hypertensive chronic kidney disease with stage 1 through stage 4 chronic kidney disease, or unspecified chronic kidney disease: Secondary | ICD-10-CM | POA: Diagnosis not present

## 2021-04-10 DIAGNOSIS — E1122 Type 2 diabetes mellitus with diabetic chronic kidney disease: Secondary | ICD-10-CM | POA: Diagnosis not present

## 2021-04-10 DIAGNOSIS — E7849 Other hyperlipidemia: Secondary | ICD-10-CM | POA: Diagnosis not present

## 2021-04-11 DIAGNOSIS — M25519 Pain in unspecified shoulder: Secondary | ICD-10-CM | POA: Diagnosis not present

## 2021-04-11 DIAGNOSIS — M6281 Muscle weakness (generalized): Secondary | ICD-10-CM | POA: Diagnosis not present

## 2021-04-15 DIAGNOSIS — M6281 Muscle weakness (generalized): Secondary | ICD-10-CM | POA: Diagnosis not present

## 2021-04-15 DIAGNOSIS — M25519 Pain in unspecified shoulder: Secondary | ICD-10-CM | POA: Diagnosis not present

## 2021-04-17 DIAGNOSIS — L570 Actinic keratosis: Secondary | ICD-10-CM | POA: Diagnosis not present

## 2021-04-17 DIAGNOSIS — M25519 Pain in unspecified shoulder: Secondary | ICD-10-CM | POA: Diagnosis not present

## 2021-04-17 DIAGNOSIS — M6281 Muscle weakness (generalized): Secondary | ICD-10-CM | POA: Diagnosis not present

## 2021-04-21 DIAGNOSIS — M6281 Muscle weakness (generalized): Secondary | ICD-10-CM | POA: Diagnosis not present

## 2021-04-21 DIAGNOSIS — M25519 Pain in unspecified shoulder: Secondary | ICD-10-CM | POA: Diagnosis not present

## 2021-04-22 DIAGNOSIS — Z6827 Body mass index (BMI) 27.0-27.9, adult: Secondary | ICD-10-CM | POA: Diagnosis not present

## 2021-04-22 DIAGNOSIS — L03311 Cellulitis of abdominal wall: Secondary | ICD-10-CM | POA: Diagnosis not present

## 2021-04-23 DIAGNOSIS — M6281 Muscle weakness (generalized): Secondary | ICD-10-CM | POA: Diagnosis not present

## 2021-04-23 DIAGNOSIS — M25519 Pain in unspecified shoulder: Secondary | ICD-10-CM | POA: Diagnosis not present

## 2021-04-28 DIAGNOSIS — Z6827 Body mass index (BMI) 27.0-27.9, adult: Secondary | ICD-10-CM | POA: Diagnosis not present

## 2021-04-28 DIAGNOSIS — L03311 Cellulitis of abdominal wall: Secondary | ICD-10-CM | POA: Diagnosis not present

## 2021-04-28 DIAGNOSIS — M6281 Muscle weakness (generalized): Secondary | ICD-10-CM | POA: Diagnosis not present

## 2021-04-28 DIAGNOSIS — M25519 Pain in unspecified shoulder: Secondary | ICD-10-CM | POA: Diagnosis not present

## 2021-04-30 DIAGNOSIS — M25519 Pain in unspecified shoulder: Secondary | ICD-10-CM | POA: Diagnosis not present

## 2021-04-30 DIAGNOSIS — M6281 Muscle weakness (generalized): Secondary | ICD-10-CM | POA: Diagnosis not present

## 2021-05-11 DIAGNOSIS — E1122 Type 2 diabetes mellitus with diabetic chronic kidney disease: Secondary | ICD-10-CM | POA: Diagnosis not present

## 2021-05-11 DIAGNOSIS — N183 Chronic kidney disease, stage 3 unspecified: Secondary | ICD-10-CM | POA: Diagnosis not present

## 2021-05-11 DIAGNOSIS — I129 Hypertensive chronic kidney disease with stage 1 through stage 4 chronic kidney disease, or unspecified chronic kidney disease: Secondary | ICD-10-CM | POA: Diagnosis not present

## 2021-05-11 DIAGNOSIS — E7849 Other hyperlipidemia: Secondary | ICD-10-CM | POA: Diagnosis not present

## 2021-05-16 DIAGNOSIS — Z6827 Body mass index (BMI) 27.0-27.9, adult: Secondary | ICD-10-CM | POA: Diagnosis not present

## 2021-05-16 DIAGNOSIS — L03311 Cellulitis of abdominal wall: Secondary | ICD-10-CM | POA: Diagnosis not present

## 2021-05-21 DIAGNOSIS — L570 Actinic keratosis: Secondary | ICD-10-CM | POA: Diagnosis not present

## 2021-05-21 DIAGNOSIS — L280 Lichen simplex chronicus: Secondary | ICD-10-CM | POA: Diagnosis not present

## 2021-05-21 DIAGNOSIS — L281 Prurigo nodularis: Secondary | ICD-10-CM | POA: Diagnosis not present

## 2021-06-11 DIAGNOSIS — N183 Chronic kidney disease, stage 3 unspecified: Secondary | ICD-10-CM | POA: Diagnosis not present

## 2021-06-11 DIAGNOSIS — I129 Hypertensive chronic kidney disease with stage 1 through stage 4 chronic kidney disease, or unspecified chronic kidney disease: Secondary | ICD-10-CM | POA: Diagnosis not present

## 2021-06-11 DIAGNOSIS — E7849 Other hyperlipidemia: Secondary | ICD-10-CM | POA: Diagnosis not present

## 2021-06-11 DIAGNOSIS — E1122 Type 2 diabetes mellitus with diabetic chronic kidney disease: Secondary | ICD-10-CM | POA: Diagnosis not present

## 2021-07-07 DIAGNOSIS — Z6825 Body mass index (BMI) 25.0-25.9, adult: Secondary | ICD-10-CM | POA: Diagnosis not present

## 2021-07-07 DIAGNOSIS — E1122 Type 2 diabetes mellitus with diabetic chronic kidney disease: Secondary | ICD-10-CM | POA: Diagnosis not present

## 2021-07-07 DIAGNOSIS — E7849 Other hyperlipidemia: Secondary | ICD-10-CM | POA: Diagnosis not present

## 2021-07-07 DIAGNOSIS — F331 Major depressive disorder, recurrent, moderate: Secondary | ICD-10-CM | POA: Diagnosis not present

## 2021-07-07 DIAGNOSIS — Z0001 Encounter for general adult medical examination with abnormal findings: Secondary | ICD-10-CM | POA: Diagnosis not present

## 2021-07-07 DIAGNOSIS — E782 Mixed hyperlipidemia: Secondary | ICD-10-CM | POA: Diagnosis not present

## 2021-07-07 DIAGNOSIS — N1831 Chronic kidney disease, stage 3a: Secondary | ICD-10-CM | POA: Diagnosis not present

## 2021-07-07 DIAGNOSIS — M545 Low back pain, unspecified: Secondary | ICD-10-CM | POA: Diagnosis not present

## 2021-07-07 DIAGNOSIS — Z1329 Encounter for screening for other suspected endocrine disorder: Secondary | ICD-10-CM | POA: Diagnosis not present

## 2021-07-07 DIAGNOSIS — K2100 Gastro-esophageal reflux disease with esophagitis, without bleeding: Secondary | ICD-10-CM | POA: Diagnosis not present

## 2021-07-07 DIAGNOSIS — I10 Essential (primary) hypertension: Secondary | ICD-10-CM | POA: Diagnosis not present

## 2021-07-07 DIAGNOSIS — M797 Fibromyalgia: Secondary | ICD-10-CM | POA: Diagnosis not present

## 2021-07-11 DIAGNOSIS — L280 Lichen simplex chronicus: Secondary | ICD-10-CM | POA: Diagnosis not present

## 2021-07-11 DIAGNOSIS — Z0001 Encounter for general adult medical examination with abnormal findings: Secondary | ICD-10-CM | POA: Diagnosis not present

## 2021-07-11 DIAGNOSIS — E1122 Type 2 diabetes mellitus with diabetic chronic kidney disease: Secondary | ICD-10-CM | POA: Diagnosis not present

## 2021-07-11 DIAGNOSIS — Z23 Encounter for immunization: Secondary | ICD-10-CM | POA: Diagnosis not present

## 2021-07-11 DIAGNOSIS — F900 Attention-deficit hyperactivity disorder, predominantly inattentive type: Secondary | ICD-10-CM | POA: Diagnosis not present

## 2021-07-11 DIAGNOSIS — R4582 Worries: Secondary | ICD-10-CM | POA: Diagnosis not present

## 2021-07-11 DIAGNOSIS — F331 Major depressive disorder, recurrent, moderate: Secondary | ICD-10-CM | POA: Diagnosis not present

## 2021-07-11 DIAGNOSIS — I10 Essential (primary) hypertension: Secondary | ICD-10-CM | POA: Diagnosis not present

## 2021-07-28 DIAGNOSIS — L570 Actinic keratosis: Secondary | ICD-10-CM | POA: Diagnosis not present

## 2021-08-11 DIAGNOSIS — E1122 Type 2 diabetes mellitus with diabetic chronic kidney disease: Secondary | ICD-10-CM | POA: Diagnosis not present

## 2021-08-11 DIAGNOSIS — N183 Chronic kidney disease, stage 3 unspecified: Secondary | ICD-10-CM | POA: Diagnosis not present

## 2021-08-11 DIAGNOSIS — E7849 Other hyperlipidemia: Secondary | ICD-10-CM | POA: Diagnosis not present

## 2021-08-11 DIAGNOSIS — I129 Hypertensive chronic kidney disease with stage 1 through stage 4 chronic kidney disease, or unspecified chronic kidney disease: Secondary | ICD-10-CM | POA: Diagnosis not present

## 2021-09-10 DIAGNOSIS — N183 Chronic kidney disease, stage 3 unspecified: Secondary | ICD-10-CM | POA: Diagnosis not present

## 2021-09-10 DIAGNOSIS — I129 Hypertensive chronic kidney disease with stage 1 through stage 4 chronic kidney disease, or unspecified chronic kidney disease: Secondary | ICD-10-CM | POA: Diagnosis not present

## 2021-09-10 DIAGNOSIS — E1122 Type 2 diabetes mellitus with diabetic chronic kidney disease: Secondary | ICD-10-CM | POA: Diagnosis not present

## 2021-09-10 DIAGNOSIS — E7849 Other hyperlipidemia: Secondary | ICD-10-CM | POA: Diagnosis not present

## 2021-10-23 DIAGNOSIS — L280 Lichen simplex chronicus: Secondary | ICD-10-CM | POA: Diagnosis not present

## 2021-10-23 DIAGNOSIS — I10 Essential (primary) hypertension: Secondary | ICD-10-CM | POA: Diagnosis not present

## 2021-10-23 DIAGNOSIS — E7849 Other hyperlipidemia: Secondary | ICD-10-CM | POA: Diagnosis not present

## 2021-10-23 DIAGNOSIS — E782 Mixed hyperlipidemia: Secondary | ICD-10-CM | POA: Diagnosis not present

## 2021-10-23 DIAGNOSIS — F900 Attention-deficit hyperactivity disorder, predominantly inattentive type: Secondary | ICD-10-CM | POA: Diagnosis not present

## 2021-10-23 DIAGNOSIS — M545 Low back pain, unspecified: Secondary | ICD-10-CM | POA: Diagnosis not present

## 2021-10-23 DIAGNOSIS — Z9189 Other specified personal risk factors, not elsewhere classified: Secondary | ICD-10-CM | POA: Diagnosis not present

## 2021-10-23 DIAGNOSIS — Z23 Encounter for immunization: Secondary | ICD-10-CM | POA: Diagnosis not present

## 2021-10-23 DIAGNOSIS — R413 Other amnesia: Secondary | ICD-10-CM | POA: Diagnosis not present

## 2021-10-23 DIAGNOSIS — R4582 Worries: Secondary | ICD-10-CM | POA: Diagnosis not present

## 2021-10-23 DIAGNOSIS — E1122 Type 2 diabetes mellitus with diabetic chronic kidney disease: Secondary | ICD-10-CM | POA: Diagnosis not present

## 2021-10-23 DIAGNOSIS — E6609 Other obesity due to excess calories: Secondary | ICD-10-CM | POA: Diagnosis not present

## 2021-10-23 DIAGNOSIS — F331 Major depressive disorder, recurrent, moderate: Secondary | ICD-10-CM | POA: Diagnosis not present

## 2021-10-23 DIAGNOSIS — M797 Fibromyalgia: Secondary | ICD-10-CM | POA: Diagnosis not present

## 2021-11-09 DIAGNOSIS — E1122 Type 2 diabetes mellitus with diabetic chronic kidney disease: Secondary | ICD-10-CM | POA: Diagnosis not present

## 2021-11-09 DIAGNOSIS — I129 Hypertensive chronic kidney disease with stage 1 through stage 4 chronic kidney disease, or unspecified chronic kidney disease: Secondary | ICD-10-CM | POA: Diagnosis not present

## 2021-11-18 DIAGNOSIS — H40012 Open angle with borderline findings, low risk, left eye: Secondary | ICD-10-CM | POA: Diagnosis not present

## 2021-11-18 DIAGNOSIS — H524 Presbyopia: Secondary | ICD-10-CM | POA: Diagnosis not present

## 2021-11-25 DIAGNOSIS — L570 Actinic keratosis: Secondary | ICD-10-CM | POA: Diagnosis not present

## 2021-12-24 DIAGNOSIS — H40013 Open angle with borderline findings, low risk, bilateral: Secondary | ICD-10-CM | POA: Diagnosis not present

## 2022-01-02 DIAGNOSIS — I10 Essential (primary) hypertension: Secondary | ICD-10-CM | POA: Diagnosis not present

## 2022-01-02 DIAGNOSIS — M79673 Pain in unspecified foot: Secondary | ICD-10-CM | POA: Diagnosis not present

## 2022-01-02 DIAGNOSIS — R0989 Other specified symptoms and signs involving the circulatory and respiratory systems: Secondary | ICD-10-CM | POA: Diagnosis not present

## 2022-01-02 DIAGNOSIS — M1611 Unilateral primary osteoarthritis, right hip: Secondary | ICD-10-CM | POA: Diagnosis not present

## 2022-01-06 DIAGNOSIS — M1612 Unilateral primary osteoarthritis, left hip: Secondary | ICD-10-CM | POA: Diagnosis not present

## 2022-01-06 DIAGNOSIS — M25552 Pain in left hip: Secondary | ICD-10-CM | POA: Diagnosis not present

## 2022-01-08 DIAGNOSIS — M79673 Pain in unspecified foot: Secondary | ICD-10-CM | POA: Diagnosis not present

## 2022-01-08 DIAGNOSIS — R0989 Other specified symptoms and signs involving the circulatory and respiratory systems: Secondary | ICD-10-CM | POA: Diagnosis not present

## 2022-01-14 DIAGNOSIS — E1165 Type 2 diabetes mellitus with hyperglycemia: Secondary | ICD-10-CM | POA: Diagnosis not present

## 2022-01-14 DIAGNOSIS — E559 Vitamin D deficiency, unspecified: Secondary | ICD-10-CM | POA: Diagnosis not present

## 2022-01-14 DIAGNOSIS — I10 Essential (primary) hypertension: Secondary | ICD-10-CM | POA: Diagnosis not present

## 2022-01-14 DIAGNOSIS — E7849 Other hyperlipidemia: Secondary | ICD-10-CM | POA: Diagnosis not present

## 2022-01-14 DIAGNOSIS — R5381 Other malaise: Secondary | ICD-10-CM | POA: Diagnosis not present

## 2022-01-14 DIAGNOSIS — N1831 Chronic kidney disease, stage 3a: Secondary | ICD-10-CM | POA: Diagnosis not present

## 2022-01-14 DIAGNOSIS — K219 Gastro-esophageal reflux disease without esophagitis: Secondary | ICD-10-CM | POA: Diagnosis not present

## 2022-01-14 DIAGNOSIS — E782 Mixed hyperlipidemia: Secondary | ICD-10-CM | POA: Diagnosis not present

## 2022-01-14 DIAGNOSIS — N183 Chronic kidney disease, stage 3 unspecified: Secondary | ICD-10-CM | POA: Diagnosis not present

## 2022-01-19 DIAGNOSIS — F900 Attention-deficit hyperactivity disorder, predominantly inattentive type: Secondary | ICD-10-CM | POA: Diagnosis not present

## 2022-01-19 DIAGNOSIS — E1122 Type 2 diabetes mellitus with diabetic chronic kidney disease: Secondary | ICD-10-CM | POA: Diagnosis not present

## 2022-01-19 DIAGNOSIS — I10 Essential (primary) hypertension: Secondary | ICD-10-CM | POA: Diagnosis not present

## 2022-01-19 DIAGNOSIS — R413 Other amnesia: Secondary | ICD-10-CM | POA: Diagnosis not present

## 2022-01-19 DIAGNOSIS — E7849 Other hyperlipidemia: Secondary | ICD-10-CM | POA: Diagnosis not present

## 2022-01-19 DIAGNOSIS — L280 Lichen simplex chronicus: Secondary | ICD-10-CM | POA: Diagnosis not present

## 2022-01-19 DIAGNOSIS — R4582 Worries: Secondary | ICD-10-CM | POA: Diagnosis not present

## 2022-01-19 DIAGNOSIS — N1831 Chronic kidney disease, stage 3a: Secondary | ICD-10-CM | POA: Diagnosis not present

## 2022-01-23 ENCOUNTER — Other Ambulatory Visit: Payer: Self-pay | Admitting: Orthopedic Surgery

## 2022-01-27 NOTE — Care Plan (Signed)
Ortho Bundle Case Management Note ? ?Patient Details  ?Name: Erica Cervantes ?MRN: [id number] ?Date of Birth: 1946-05-03 ? ?Spoke with patient prior to surgery. She will discharge to home with family to assist. Rolling walker ordered. OPPT set up with [facility]. Patient and MD in agreement with plan. Choice offered                 ? ? ? ?DME Arranged:  Walker rolling ?DME Agency:  [employer] ? ?HH Arranged:    ?[place] Agency:    ? ?Additional Comments: ?Please contact me with any questions of if this plan should need to change. ? ?Mardelle Matte  [facility] Orthopaedic Specialist  [phone] ?01/27/2022, 12:15 PM ?  ?

## 2022-01-30 NOTE — Patient Instructions (Signed)
2 VISITORS ARE ALLOWED TO COME WITH YOU AND STAY IN THE WAITING ROOM ONLY DURING PRE OP AND PROCEDURE.   ? ?**NO VISITORS ARE ALLOWED IN THE SHORT STAY AREA OR RECOVERY ROOM!!** ? ?IF YOU WILL BE ADMITTED INTO THE HOSPITAL YOU ARE ALLOWED 4 SUPPORT PEOPLE DURING VISITATION HOURS ONLY (7 AM -8PM)   ?The support person(s) must pass our screening, gel in and out, and wear a mask at all times, including in the patient?s room. ?Patients must also wear a mask when staff or their support person are in the room. ?Visitors GUEST BADGE MUST BE WORN VISIBLY  ?One adult visitor may remain with you overnight and MUST be in the room by 8 P.M. ?  ? ? Your procedure is scheduled on: 02/06/22 ? ? Report to [facility] Main Entrance ? ?  Report to admitting at  7:30 AM ? ? Call this number if you have problems the morning of surgery [phone] ? ? Do not eat food :After Midnight. ? ? After Midnight you may have the following liquids until __7:00____ AM/  DAY OF SURGERY ? ?Water ?Black Coffee (sugar ok, NO MILK/CREAM OR CREAMERS)  ?Tea (sugar ok, NO MILK/CREAM OR CREAMERS) regular and decaf                             ?Plain Jell-O (NO RED)                                           ?Fruit ices (not with fruit pulp, NO RED)                                     ?Popsicles (NO RED)                                                                  ?Juice: apple, WHITE grape, WHITE cranberry ?Sports drinks like Gatorade (NO RED) ?Clear broth(vegetable,chicken,beef) ? ?             ?  ?  ?The day of surgery:  ?Drink ONE (1)  G2 at 6:45 AM the morning of surgery. Drink in one sitting. Do not sip.  ?This drink was given to you during your hospital  ?pre-op appointment visit. ?Nothing else to drink after completing the  ?G2. At 7:00 AM ?  ?       If you have questions, please contact your surgeon?s office. ? ?  ?  ?Oral Hygiene is also important to reduce your risk of infection.                                    ?Remember - BRUSH YOUR  TEETH THE MORNING OF SURGERY WITH YOUR REGULAR TOOTHPASTE ? ? Do NOT smoke after Midnight ? ? Take these medicines the morning of surgery with A SIP OF WATER: Buspirone, Cymbalta ? ?DO NOT TAKE ANY ORAL DIABETIC MEDICATIONS DAY OF YOUR SURGERY(Metformin) ?. ?                  ?  You may not have any metal on your body including hair pins, jewelry, and body piercing ? ?           Do not wear make-up, lotions, powders, perfumes/cologne, or deodorant ? ?Do not wear nail polish including gel and S&S, artificial/acrylic nails, or any other type of covering on natural nails including finger and toenails. If you have artificial nails, gel coating, etc. that needs to be removed by a nail salon please have this removed prior to surgery or surgery may need to be canceled/ delayed if the surgeon/ anesthesia feels like they are unable to be safely monitored.  ? ?Do not shave  48 hours prior to surgery.  ? ?           ? ? Do not bring valuables to the hospital. [facility] NOT ?            RESPONSIBLE   FOR VALUABLES. ? ? Contacts, dentures or bridgework may not be worn into surgery. ? ?  ? Patients discharged on the day of surgery will not be allowed to drive home.  Someone NEEDS to stay with you for the first 24 hours after anesthesia. ? ? Special Instructions: Bring a copy of your healthcare power of attorney and living will documents  the day of surgery if you haven't scanned them before. ? ?            Please read over the following fact sheets you were given: IF [place] [phone] ? ?   [place] - Preparing for Surgery ?Before surgery, you can play an important role.  Because skin is not sterile, your skin needs to be as free of germs as possible.  You can reduce the number of germs on your skin by washing with CHG (chlorahexidine gluconate) soap before surgery.  CHG is an antiseptic cleaner which kills germs and bonds with the skin to continue killing  germs even after washing. ?Please DO NOT use if you have an allergy to CHG or antibacterial soaps.  If your skin becomes reddened/irritated stop using the CHG and inform your nurse when you arrive at Short Stay. ?Do not shave (including legs and underarms) for at least 48 hours prior to the first CHG shower.   ?Please follow these instructions carefully: ? 1.  Shower with CHG Soap the night before surgery and the  morning of Surgery. ? 2.  If you choose to wash your hair, wash your hair first as usual with your  normal  shampoo. ? 3.  After you shampoo, rinse your hair and body thoroughly to remove the  shampoo.                 ?           4.  Use CHG as you would any other liquid soap.  You can apply chg directly  to the skin and wash  ?                     Gently with a scrungie or clean washcloth. ? 5.  Apply the CHG Soap to your body ONLY FROM THE NECK DOWN.   Do not use on face/ open      ?                     Wound or open sores. Avoid contact with eyes, ears mouth and genitals (private  parts).  ?                     Production manager,  Genitals (private parts) with your normal soap. ?            6.  Wash thoroughly, paying special attention to the area where your surgery  will be performed. ? 7.  Thoroughly rinse your body with warm water from the neck down. ? 8.  DO NOT shower/wash with your normal soap after using and rinsing off  the CHG Soap. ?               9.  Pat yourself dry with a clean towel. ?           10.  Wear clean pajamas. ?           11.  Place clean sheets on your bed the night of your first shower and do not  sleep with pets. ?Day of Surgery : ?Do not apply any lotions/deodorants the morning of surgery.  Please wear clean clothes to the hospital/surgery center. ? ?FAILURE TO FOLLOW THESE INSTRUCTIONS MAY RESULT IN THE CANCELLATION OF YOUR SURGERY ? ? ?________________________________________________________________________  ? ?Incentive Spirometer ? ?An incentive spirometer is a tool that can help  keep your lungs clear and active. This tool measures how well you are filling your lungs with each breath. Taking long deep breaths may help reverse or decrease the chance of developing breathing (pulmonary) problems (especially infection) following: ?A long period of time when you are unable to move or be active. ?BEFORE THE PROCEDURE  ?If the spirometer includes an indicator to show your best effort, your nurse or respiratory therapist will set it to a desired goal. ?If possible, sit up straight or lean slightly forward. Try not to slouch. ?Hold the incentive spirometer in an upright position. ?INSTRUCTIONS FOR USE  ?Sit on the edge of your bed if possible, or sit up as far as you can in bed or on a chair. ?Hold the incentive spirometer in an upright position. ?Breathe out normally. ?Place the mouthpiece in your mouth and seal your lips tightly around it. ?Breathe in slowly and as deeply as possible, raising the piston or the ball toward the top of the column. ?Hold your breath for 3-5 seconds or for as long as possible. Allow the piston or ball to fall to the bottom of the column. ?Remove the mouthpiece from your mouth and breathe out normally. ?Rest for a few seconds and repeat Steps 1 through 7 at least 10 times every 1-2 hours when you are awake. Take your time and take a few normal breaths between deep breaths. ?The spirometer may include an indicator to show your best effort. Use the indicator as a goal to work toward during each repetition. ?After each set of 10 deep breaths, practice coughing to be sure your lungs are clear. If you have an incision (the cut made at the time of surgery), support your incision when coughing by placing a pillow or rolled up towels firmly against it. ?Once you are able to get out of bed, walk around indoors and cough well. You may stop using the incentive spirometer when instructed by your caregiver.  ?RISKS AND COMPLICATIONS ?Take your time so you do not get dizzy or  light-headed. ?If you are in pain, you may need to take or ask for pain medication before doing incentive spirometry. It is harder to take a deep breath if  you are having pain. ?AFTER USE ?Rest and breathe s

## 2022-02-03 ENCOUNTER — Other Ambulatory Visit: Payer: Self-pay

## 2022-02-03 ENCOUNTER — Ambulatory Visit (HOSPITAL_COMMUNITY)
Admission: RE | Admit: 2022-02-03 | Discharge: 2022-02-03 | Disposition: A | Discharge status: Home / Self Care | Payer: HMO | Source: Ambulatory Visit | Attending: Orthopedic Surgery | Admitting: Orthopedic Surgery

## 2022-02-03 ENCOUNTER — Encounter (HOSPITAL_COMMUNITY)
Admission: RE | Admit: 2022-02-03 | Discharge: 2022-02-03 | Disposition: A | Discharge status: Home / Self Care | Payer: HMO | Source: Ambulatory Visit | Attending: Orthopedic Surgery | Admitting: Orthopedic Surgery

## 2022-02-03 ENCOUNTER — Encounter (HOSPITAL_COMMUNITY): Payer: Self-pay

## 2022-02-03 VITALS — BP 137/81 | HR 79 | Temp 97.7°F | Resp 18 | Ht 63.0 in | Wt 150.0 lb

## 2022-02-03 DIAGNOSIS — E119 Type 2 diabetes mellitus without complications: Secondary | ICD-10-CM

## 2022-02-03 DIAGNOSIS — Z01818 Encounter for other preprocedural examination: Secondary | ICD-10-CM | POA: Diagnosis not present

## 2022-02-03 HISTORY — DX: Gastro-esophageal reflux disease without esophagitis: K21.9

## 2022-02-03 HISTORY — DX: Myoneural disorder, unspecified: G70.9

## 2022-02-03 HISTORY — DX: Depression, unspecified: F32.A

## 2022-02-03 HISTORY — DX: Chronic kidney disease, unspecified: N18.9

## 2022-02-03 LAB — CBC
HCT: 39.9 % (ref 36.0–46.0)
Hemoglobin: 12.4 g/dL (ref 12.0–15.0)
MCH: 27.4 pg (ref 26.0–34.0)
MCHC: 31.1 g/dL (ref 30.0–36.0)
MCV: 88.1 fL (ref 80.0–100.0)
Platelets: 363 10*3/uL (ref 150–400)
RBC: 4.53 MIL/uL (ref 3.87–5.11)
RDW: 14.4 % (ref 11.5–15.5)
WBC: 8.8 10*3/uL (ref 4.0–10.5)
nRBC: 0 % (ref 0.0–0.2)

## 2022-02-03 LAB — HEMOGLOBIN A1C
Hgb A1c MFr Bld: 8 % — ABNORMAL HIGH (ref 4.8–5.6)
Mean Plasma Glucose: 182.9 mg/dL

## 2022-02-03 LAB — BASIC METABOLIC PANEL
Anion gap: 9 (ref 5–15)
BUN: 33 mg/dL — ABNORMAL HIGH (ref 8–23)
CO2: 28 mmol/L (ref 22–32)
Calcium: 9.9 mg/dL (ref 8.9–10.3)
Chloride: 103 mmol/L (ref 98–111)
Creatinine, Ser: 1.05 mg/dL — ABNORMAL HIGH (ref 0.44–1.00)
GFR, Estimated: 55 mL/min — ABNORMAL LOW (ref >60)
Glucose, Bld: 174 mg/dL — ABNORMAL HIGH (ref 70–99)
Potassium: 4.6 mmol/L (ref 3.5–5.1)
Sodium: 140 mmol/L (ref 135–145)

## 2022-02-03 LAB — GLUCOSE, CAPILLARY: Glucose-Capillary: 153 mg/dL — ABNORMAL HIGH (ref 70–99)

## 2022-02-03 LAB — SURGICAL PCR SCREEN
MRSA, PCR: POSITIVE — AB
Staphylococcus aureus: POSITIVE — AB

## 2022-02-03 NOTE — Progress Notes (Incomplete)
Anesthesia note: ? ?Bowel prep reminder:NA ? ?PCP - Dr. Dub Amis ?Cardiologist -none ?Other-  ? ?Chest x-ray - no ?EKG - 01/25/22-epic ?Stress Test - no ?ECHO - no ?Cardiac Cath - no ? ?Pacemaker/ICD device last checked:NA ? ?Sleep Study - no ?CPAP -  ?Pt has a free style Libre on the Lt arm for DOS ?Fasting Blood Sugar - 125-159 ?Checks Blood Sugar _____ ? ?Blood Thinner:NA ?Blood Thinner Instructions: ?Aspirin Instructions: ?Last Dose: ? ?Anesthesia review: no ? ?Patient denies shortness of breath, fever, cough and chest pain at PAT appointment ?Pt has no SOB just pain climbing stairs, doing housework or with ADLs. She has some memory loss and has someone to help her  at home. ? ?Patient verbalized understanding of instructions that were given to them at the PAT appointment. Patient was also instructed that they will need to review over the PAT instructions again at home before surgery. Yes. Her friend was with her. ?

## 2022-02-05 ENCOUNTER — Encounter (HOSPITAL_COMMUNITY): Payer: Self-pay | Admitting: Orthopedic Surgery

## 2022-02-06 ENCOUNTER — Ambulatory Visit (HOSPITAL_COMMUNITY): Payer: HMO

## 2022-02-06 ENCOUNTER — Ambulatory Visit (HOSPITAL_COMMUNITY)
Admission: RE | Admit: 2022-02-06 | Discharge: 2022-02-06 | Disposition: A | Discharge status: Home / Self Care | Payer: HMO | Attending: Orthopedic Surgery | Admitting: Orthopedic Surgery

## 2022-02-06 ENCOUNTER — Ambulatory Visit (HOSPITAL_COMMUNITY): Payer: HMO | Admitting: Anesthesiology

## 2022-02-06 ENCOUNTER — Encounter (HOSPITAL_COMMUNITY)
Admission: RE | Disposition: A | Discharge status: Home / Self Care | Payer: Self-pay | Source: Home / Self Care | Attending: Orthopedic Surgery

## 2022-02-06 ENCOUNTER — Ambulatory Visit (HOSPITAL_BASED_OUTPATIENT_CLINIC_OR_DEPARTMENT_OTHER): Payer: HMO | Admitting: Anesthesiology

## 2022-02-06 ENCOUNTER — Encounter (HOSPITAL_COMMUNITY): Payer: Self-pay | Admitting: Orthopedic Surgery

## 2022-02-06 ENCOUNTER — Other Ambulatory Visit: Payer: Self-pay

## 2022-02-06 DIAGNOSIS — R2689 Other abnormalities of gait and mobility: Secondary | ICD-10-CM | POA: Diagnosis not present

## 2022-02-06 DIAGNOSIS — M1611 Unilateral primary osteoarthritis, right hip: Secondary | ICD-10-CM

## 2022-02-06 DIAGNOSIS — I129 Hypertensive chronic kidney disease with stage 1 through stage 4 chronic kidney disease, or unspecified chronic kidney disease: Secondary | ICD-10-CM | POA: Diagnosis not present

## 2022-02-06 DIAGNOSIS — N289 Disorder of kidney and ureter, unspecified: Secondary | ICD-10-CM | POA: Diagnosis not present

## 2022-02-06 DIAGNOSIS — Z96641 Presence of right artificial hip joint: Secondary | ICD-10-CM | POA: Diagnosis not present

## 2022-02-06 DIAGNOSIS — Z471 Aftercare following joint replacement surgery: Secondary | ICD-10-CM | POA: Diagnosis not present

## 2022-02-06 DIAGNOSIS — N184 Chronic kidney disease, stage 4 (severe): Secondary | ICD-10-CM | POA: Diagnosis not present

## 2022-02-06 DIAGNOSIS — E119 Type 2 diabetes mellitus without complications: Secondary | ICD-10-CM

## 2022-02-06 DIAGNOSIS — Z7984 Long term (current) use of oral hypoglycemic drugs: Secondary | ICD-10-CM | POA: Diagnosis not present

## 2022-02-06 DIAGNOSIS — E1122 Type 2 diabetes mellitus with diabetic chronic kidney disease: Secondary | ICD-10-CM | POA: Diagnosis not present

## 2022-02-06 DIAGNOSIS — I10 Essential (primary) hypertension: Secondary | ICD-10-CM

## 2022-02-06 HISTORY — PX: TOTAL HIP ARTHROPLASTY: SHX124

## 2022-02-06 LAB — ABO/RH: ABO/RH(D): O POS

## 2022-02-06 LAB — GLUCOSE, CAPILLARY
Glucose-Capillary: 164 mg/dL — ABNORMAL HIGH (ref 70–99)
Glucose-Capillary: 179 mg/dL — ABNORMAL HIGH (ref 70–99)

## 2022-02-06 SURGERY — ARTHROPLASTY, HIP, TOTAL, ANTERIOR APPROACH
Anesthesia: Spinal | Site: Hip | Laterality: Right

## 2022-02-06 MED ORDER — PROPOFOL 1000 MG/100ML IV EMUL
INTRAVENOUS | Status: AC
  Filled 2022-02-06: qty 100

## 2022-02-06 MED ORDER — LIDOCAINE HCL (PF) 2 % IJ SOLN
INTRAMUSCULAR | Status: AC
  Filled 2022-02-06: qty 5

## 2022-02-06 MED ORDER — BUPIVACAINE-EPINEPHRINE 0.25% -1:200000 IJ SOLN
INTRAMUSCULAR | Status: DC | PRN
  Administered 2022-02-06: 30 mL

## 2022-02-06 MED ORDER — PHENYLEPHRINE HCL-NACL 20-0.9 MG/250ML-% IV SOLN
INTRAVENOUS | Status: DC | PRN
Start: 2022-02-06 — End: 2022-02-06
  Administered 2022-02-06: 40 ug/min via INTRAVENOUS

## 2022-02-06 MED ORDER — ONDANSETRON HCL 4 MG/2ML IJ SOLN
INTRAMUSCULAR | Status: AC
  Filled 2022-02-06: qty 2

## 2022-02-06 MED ORDER — TRANEXAMIC ACID-NACL 1000-0.7 MG/100ML-% IV SOLN
1000.0000 mg | INTRAVENOUS | Status: AC
  Administered 2022-02-06: 1000 mg via INTRAVENOUS
  Filled 2022-02-06: qty 100

## 2022-02-06 MED ORDER — DEXMEDETOMIDINE (PRECEDEX) IN NS 20 MCG/5ML (4 MCG/ML) IV SYRINGE
PREFILLED_SYRINGE | INTRAVENOUS | Status: AC
  Filled 2022-02-06: qty 5

## 2022-02-06 MED ORDER — TRANEXAMIC ACID-NACL 1000-0.7 MG/100ML-% IV SOLN
1000.0000 mg | Freq: Once | INTRAVENOUS | Status: AC
  Administered 2022-02-06: 1000 mg via INTRAVENOUS

## 2022-02-06 MED ORDER — BUPIVACAINE LIPOSOME 1.3 % IJ SUSP
INTRAMUSCULAR | Status: AC
  Filled 2022-02-06: qty 20

## 2022-02-06 MED ORDER — BUPIVACAINE LIPOSOME 1.3 % IJ SUSP
10.0000 mL | Freq: Once | INTRAMUSCULAR | Status: DC
Start: 2022-02-06 — End: 2022-02-06

## 2022-02-06 MED ORDER — METHOCARBAMOL 500 MG PO TABS: 500.0000 mg | ORAL_TABLET | Freq: Four times a day (QID) | ORAL | Status: DC | PRN

## 2022-02-06 MED ORDER — ONDANSETRON HCL 4 MG/2ML IJ SOLN
INTRAMUSCULAR | Status: DC | PRN
  Administered 2022-02-06: 4 mg via INTRAVENOUS

## 2022-02-06 MED ORDER — TRANEXAMIC ACID-NACL 1000-0.7 MG/100ML-% IV SOLN
INTRAVENOUS | Status: AC
  Filled 2022-02-06: qty 100

## 2022-02-06 MED ORDER — BUPIVACAINE LIPOSOME 1.3 % IJ SUSP
INTRAMUSCULAR | Status: DC | PRN
  Administered 2022-02-06: 20 mL

## 2022-02-06 MED ORDER — SODIUM CHLORIDE (PF) 0.9 % IJ SOLN
INTRAMUSCULAR | Status: AC
  Filled 2022-02-06: qty 50

## 2022-02-06 MED ORDER — DOCUSATE SODIUM 100 MG PO CAPS: 100.0000 mg | ORAL_CAPSULE | Freq: Two times a day (BID) | ORAL | 0 refills | Status: DC

## 2022-02-06 MED ORDER — PHENYLEPHRINE HCL (PRESSORS) 10 MG/ML IV SOLN
INTRAVENOUS | Status: AC
  Filled 2022-02-06: qty 1

## 2022-02-06 MED ORDER — LACTATED RINGERS IV BOLUS
250.0000 mL | Freq: Once | INTRAVENOUS | Status: AC
  Administered 2022-02-06: 250 mL via INTRAVENOUS

## 2022-02-06 MED ORDER — EPHEDRINE SULFATE-NACL 50-0.9 MG/10ML-% IV SOSY
PREFILLED_SYRINGE | INTRAVENOUS | Status: DC | PRN
  Administered 2022-02-06: 5 mg via INTRAVENOUS
  Administered 2022-02-06: 10 mg via INTRAVENOUS
  Administered 2022-02-06: 5 mg via INTRAVENOUS

## 2022-02-06 MED ORDER — PROPOFOL 500 MG/50ML IV EMUL
INTRAVENOUS | Status: DC | PRN
  Administered 2022-02-06: 75 ug/kg/min via INTRAVENOUS

## 2022-02-06 MED ORDER — PROPOFOL 10 MG/ML IV BOLUS
INTRAVENOUS | Status: AC
Start: 2022-02-06 — End: ?
  Filled 2022-02-06: qty 20

## 2022-02-06 MED ORDER — BUPIVACAINE-EPINEPHRINE (PF) 0.5% -1:200000 IJ SOLN
INTRAMUSCULAR | Status: AC
  Filled 2022-02-06: qty 30

## 2022-02-06 MED ORDER — DEXAMETHASONE SODIUM PHOSPHATE 10 MG/ML IJ SOLN
INTRAMUSCULAR | Status: DC | PRN
  Administered 2022-02-06: 4 mg via INTRAVENOUS

## 2022-02-06 MED ORDER — DEXAMETHASONE SODIUM PHOSPHATE 10 MG/ML IJ SOLN
INTRAMUSCULAR | Status: AC
  Filled 2022-02-06: qty 1

## 2022-02-06 MED ORDER — LIDOCAINE 2% (20 MG/ML) 5 ML SYRINGE
INTRAMUSCULAR | Status: DC | PRN
Start: 2022-02-06 — End: 2022-02-06
  Administered 2022-02-06: 50 mg via INTRAVENOUS

## 2022-02-06 MED ORDER — LACTATED RINGERS IV BOLUS
500.0000 mL | Freq: Once | INTRAVENOUS | Status: AC
  Administered 2022-02-06: 500 mL via INTRAVENOUS

## 2022-02-06 MED ORDER — VANCOMYCIN HCL IN DEXTROSE 1-5 GM/200ML-% IV SOLN
INTRAVENOUS | Status: AC
  Administered 2022-02-06: 1000 mg via INTRAVENOUS
  Filled 2022-02-06: qty 200

## 2022-02-06 MED ORDER — PROPOFOL 10 MG/ML IV BOLUS
INTRAVENOUS | Status: DC | PRN
  Administered 2022-02-06 (×2): 20 mg via INTRAVENOUS

## 2022-02-06 MED ORDER — VANCOMYCIN HCL IN DEXTROSE 1-5 GM/200ML-% IV SOLN: 1000.0000 mg | Freq: Once | INTRAVENOUS | Status: AC

## 2022-02-06 MED ORDER — POVIDONE-IODINE 10 % EX SWAB
2.0000 "application " | Freq: Once | CUTANEOUS | Status: AC
  Administered 2022-02-06: 2 via TOPICAL

## 2022-02-06 MED ORDER — ORAL CARE MOUTH RINSE: 15.0000 mL | Freq: Once | OROMUCOSAL | Status: AC

## 2022-02-06 MED ORDER — DROPERIDOL 2.5 MG/ML IJ SOLN: 0.6250 mg | Freq: Once | INTRAMUSCULAR | Status: DC | PRN

## 2022-02-06 MED ORDER — HYDROMORPHONE HCL 1 MG/ML IJ SOLN: 0.2500 mg | INTRAMUSCULAR | Status: DC | PRN

## 2022-02-06 MED ORDER — TIZANIDINE HCL 2 MG PO TABS: 2.0000 mg | ORAL_TABLET | Freq: Three times a day (TID) | ORAL | 0 refills | Status: DC | PRN

## 2022-02-06 MED ORDER — VANCOMYCIN HCL IN DEXTROSE 1-5 GM/200ML-% IV SOLN
1000.0000 mg | INTRAVENOUS | Status: AC
  Administered 2022-02-06: 1000 mg via INTRAVENOUS
  Filled 2022-02-06: qty 200

## 2022-02-06 MED ORDER — EPHEDRINE 5 MG/ML INJ
INTRAVENOUS | Status: AC
  Filled 2022-02-06: qty 5

## 2022-02-06 MED ORDER — BUPIVACAINE IN DEXTROSE 0.75-8.25 % IT SOLN
INTRATHECAL | Status: DC | PRN
  Administered 2022-02-06: 1.6 mL via INTRATHECAL

## 2022-02-06 MED ORDER — LACTATED RINGERS IV SOLN: INTRAVENOUS | Status: DC

## 2022-02-06 MED ORDER — SODIUM CHLORIDE 0.9 % IR SOLN
Status: DC | PRN
  Administered 2022-02-06: 1000 mL

## 2022-02-06 MED ORDER — WATER FOR IRRIGATION, STERILE IR SOLN
Status: DC | PRN
  Administered 2022-02-06: 1000 mL

## 2022-02-06 MED ORDER — ACETAMINOPHEN 500 MG PO TABS
1000.0000 mg | ORAL_TABLET | Freq: Once | ORAL | Status: AC
  Administered 2022-02-06: 1000 mg via ORAL
  Filled 2022-02-06: qty 2

## 2022-02-06 MED ORDER — METHOCARBAMOL 500 MG IVPB - SIMPLE MED
500.0000 mg | Freq: Four times a day (QID) | INTRAVENOUS | Status: DC | PRN
  Filled 2022-02-06: qty 50

## 2022-02-06 MED ORDER — ASPIRIN EC 325 MG PO TBEC: 325.0000 mg | DELAYED_RELEASE_TABLET | Freq: Two times a day (BID) | ORAL | 0 refills | Status: DC

## 2022-02-06 MED ORDER — CHLORHEXIDINE GLUCONATE 0.12 % MT SOLN
15.0000 mL | Freq: Once | OROMUCOSAL | Status: AC
  Administered 2022-02-06: 15 mL via OROMUCOSAL

## 2022-02-06 MED ORDER — CEFAZOLIN SODIUM-DEXTROSE 2-4 GM/100ML-% IV SOLN: 2.0000 g | Freq: Once | INTRAVENOUS | Status: DC

## 2022-02-06 MED ORDER — OXYCODONE-ACETAMINOPHEN 5-325 MG PO TABS
1.0000 | ORAL_TABLET | Freq: Four times a day (QID) | ORAL | 0 refills | Status: DC | PRN
Start: 2022-02-06 — End: 2022-10-29

## 2022-02-06 SURGICAL SUPPLY — 45 items
BAG COUNTER SPONGE SURGICOUNT (BAG) IMPLANT
BAG ZIPLOCK 12X15 (MISCELLANEOUS) IMPLANT
BENZOIN TINCTURE PRP APPL 2/3 (GAUZE/BANDAGES/DRESSINGS) ×1 IMPLANT
BLADE SAW SGTL 18X1.27X75 (BLADE) ×2 IMPLANT
BLADE SURG SZ10 CARB STEEL (BLADE) ×4 IMPLANT
CLSR STERI-STRIP ANTIMIC 1/2X4 (GAUZE/BANDAGES/DRESSINGS) ×1 IMPLANT
COVER PERINEAL POST (MISCELLANEOUS) ×2 IMPLANT
COVER SURGICAL LIGHT HANDLE (MISCELLANEOUS) ×2 IMPLANT
CUP GRIPTON 48MM 100 HIP (Hips) ×1 IMPLANT
DRAPE FOOT SWITCH (DRAPES) ×2 IMPLANT
DRAPE STERI IOBAN 125X83 (DRAPES) ×2 IMPLANT
DRAPE U-SHAPE 47X51 STRL (DRAPES) ×4 IMPLANT
DRESSING AQUACEL AG SP 3.5X6 (GAUZE/BANDAGES/DRESSINGS) IMPLANT
DRSG AQUACEL AG ADV 3.5X 6 (GAUZE/BANDAGES/DRESSINGS) ×2 IMPLANT
DRSG AQUACEL AG SP 3.5X6 (GAUZE/BANDAGES/DRESSINGS) ×2
DURAPREP 26ML APPLICATOR (WOUND CARE) ×2 IMPLANT
ELECT BLADE TIP CTD 4 INCH (ELECTRODE) ×2 IMPLANT
ELECT REM PT RETURN 15FT ADLT (MISCELLANEOUS) ×2 IMPLANT
ELIMINATOR HOLE APEX DEPUY (Hips) ×1 IMPLANT
GAUZE XEROFORM 1X8 LF (GAUZE/BANDAGES/DRESSINGS) IMPLANT
GLOVE BIOGEL PI IND STRL 8 (GLOVE) ×2 IMPLANT
GLOVE BIOGEL PI INDICATOR 8 (GLOVE) ×2
GLOVE ECLIPSE 7.5 STRL STRAW (GLOVE) ×4 IMPLANT
GOWN STRL REUS W/ TWL XL LVL3 (GOWN DISPOSABLE) ×2 IMPLANT
GOWN STRL REUS W/TWL XL LVL3 (GOWN DISPOSABLE) ×2
HEAD FEM STD 32X+1 STRL (Hips) ×1 IMPLANT
HOLDER FOLEY CATH W/STRAP (MISCELLANEOUS) ×2 IMPLANT
HOOD PEEL AWAY FLYTE STAYCOOL (MISCELLANEOUS) ×4 IMPLANT
KIT TURNOVER KIT A (KITS) IMPLANT
LINER ACET 32X48 (Liner) ×1 IMPLANT
NEEDLE HYPO 22GX1.5 SAFETY (NEEDLE) ×2 IMPLANT
PACK ANTERIOR HIP CUSTOM (KITS) ×2 IMPLANT
PENCIL SMOKE EVACUATOR (MISCELLANEOUS) IMPLANT
SPIKE FLUID TRANSFER (MISCELLANEOUS) ×2 IMPLANT
SPONGE T-LAP 18X18 ~~LOC~~+RFID (SPONGE) ×6 IMPLANT
STAPLER VISISTAT 35W (STAPLE) IMPLANT
STEM FEMORAL SZ 6MM STD ACTIS (Stem) ×1 IMPLANT
STRIP CLOSURE SKIN 1/2X4 (GAUZE/BANDAGES/DRESSINGS) IMPLANT
SUT ETHIBOND NAB CT1 #1 30IN (SUTURE) ×4 IMPLANT
SUT MNCRL AB 3-0 PS2 18 (SUTURE) IMPLANT
SUT VIC AB 0 CT1 36 (SUTURE) ×2 IMPLANT
SUT VIC AB 1 CT1 36 (SUTURE) ×2 IMPLANT
SUT VIC AB 2-0 CT1 27 (SUTURE) ×1
SUT VIC AB 2-0 CT1 TAPERPNT 27 (SUTURE) ×1 IMPLANT
TRAY FOLEY MTR SLVR 16FR STAT (SET/KITS/TRAYS/PACK) ×2 IMPLANT

## 2022-02-06 NOTE — Discharge Instructions (Signed)

## 2022-02-06 NOTE — Evaluation (Signed)
Physical Therapy Evaluation ?Patient Details ?Name: Erica Cervantes ?MRN: [id number] ?DOB: 04/21/1946 ?Today's Date: 02/06/2022 ? ?History of Present Illness ? Pt is 76 yo female s/p R anterior THA on 02/06/22.  Pt with hx of kidney disease, depression, DM, GERD, HTN, peripheral neuropathy.  Pt reports needs L shoulder surgery for rotator cuff injury and back surgery.  ?Clinical Impression ? Pt is s/p THA resulting in the deficits listed below (see PT Problem List). Pt independent at baseline with RW.  She has her sister staying with her for the next few days and has DME.  PT asked to see pt in PACU for possible same day d/c.  Pt had good pain control, ambulated 100' with RW , and performed stairs similar to home set-up.  Pt demonstrates safe gait & transfers in order to return home from PT perspective once discharged by MD.  While in hospital, will continue to benefit from PT for skilled therapy to advance mobility and exercises.   ?  ?   ?   ? ?Recommendations for follow up therapy are one component of a multi-disciplinary discharge planning process, led by the attending physician.  Recommendations may be updated based on patient status, additional functional criteria and insurance authorization. ? ?Follow Up Recommendations Follow physician's recommendations for discharge plan and follow up therapies ? ?  ?Assistance Recommended at Discharge Intermittent Supervision/Assistance  ?Patient can return home with the following ? A little help with walking and/or transfers;A little help with bathing/dressing/bathroom;Assistance with cooking/housework;Help with stairs or ramp for entrance ? ?  ?Equipment Recommendations None recommended by PT  ?Recommendations for Other Services ?    ?  ?Functional Status Assessment Patient has had a recent decline in their functional status and demonstrates the ability to make significant improvements in function in a reasonable and predictable amount of time.  ? ?  ?Precautions / Restrictions  Precautions ?Precautions: Fall ?Restrictions ?Weight Bearing Restrictions: Yes ?RLE Weight Bearing: Weight bearing as tolerated  ? ?  ? ?Mobility ? Bed Mobility ?Overal bed mobility: Needs Assistance ?Bed Mobility: Supine to Sit ?  ?  ?Supine to sit: Supervision ?  ?  ?  ?  ? ?Transfers ?Overall transfer level: Needs assistance ?Equipment used: Rolling walker (2 wheels) ?Transfers: Sit to/from Stand ?Sit to Stand: Supervision ?  ?  ?  ?  ?  ?General transfer comment: performed without difficulty ?  ? ?Ambulation/Gait ?Ambulation/Gait assistance: Min guard, Supervision ?Gait Distance (Feet): 100 Feet ?Assistive device: Rolling walker (2 wheels) ?Gait Pattern/deviations: Step-through pattern, Decreased stride length ?Gait velocity: decreased ?  ?  ?General Gait Details: min cues for RW initially; min guard progressing to supervision ? ?Stairs ?Stairs: Yes ?Stairs assistance: Min guard ?Stair Management: Two rails, Alternating pattern, Forwards ?Number of Stairs: 5 ?General stair comments: educated on step to sequencing but pt able to do alternating ? ?Wheelchair Mobility ?  ? ?Modified Rankin (Stroke Patients Only) ?  ? ?  ? ?Balance Overall balance assessment: Needs assistance ?Sitting-balance support: No upper extremity supported ?Sitting balance-Leahy Scale: Good ?  ?  ?Standing balance support: Bilateral upper extremity supported, No upper extremity supported ?Standing balance-Leahy Scale: Fair ?Standing balance comment: RW to ambulate but could static stand without AD ?  ?  ?  ?  ?  ?  ?  ?  ?  ?  ?  ?   ? ? ? ?Pertinent Vitals/Pain Pain Assessment ?Pain Assessment: 0-10 ?Pain Score: 6  ?Pain Location: R hip ?Pain Descriptors /  Indicators: Discomfort, Sore, Aching ?Pain Intervention(s): Limited activity within patient's tolerance, Monitored during session  ? ? ?Home Living Family/patient expects to be discharged to:: Private residence ?Living Arrangements: Other (Comment) (acquitance) ?Available Help at  Discharge: Family;Available 24 hours/day (sister staying with her till Sunday) ?Type of Home: House ?Home Access: Stairs to enter ?Entrance Stairs-Rails: Right;Left ?Entrance Stairs-Number of Steps: 1 ?  ?Home Layout: One level ?Home Equipment: Advice worker (2 wheels);Rollator (4 wheels);Cane - single point ?   ?  ?Prior Function Prior Level of Function : Independent/Modified Independent;Driving ?  ?  ?  ?  ?  ?  ?Mobility Comments: Using a RW ; ambulated short community distances (limited by hip and back) ?ADLs Comments: independent with ADLs and IADLs ?  ? ? ?Hand Dominance  ?   ? ?  ?Extremity/Trunk Assessment  ? Upper Extremity Assessment ?Upper Extremity Assessment: LUE deficits/detail;RUE deficits/detail ?RUE Deficits / Details: ROM WFL; MMT 5/5 ?LUE Deficits / Details: ROM: shoulder elevation to 90 only otherwise WFL; MMT 4/5 ?  ? ?Lower Extremity Assessment ?Lower Extremity Assessment: LLE deficits/detail;RLE deficits/detail ?RLE Deficits / Details: ROM: WFL; MMT: ankle 5/5, knee 3/5, hip 3/5 ?LLE Deficits / Details: ROM WFL; MMT 5/5 ?  ? ?Cervical / Trunk Assessment ?Cervical / Trunk Assessment: Normal  ?Communication  ? Communication: No difficulties  ?Cognition Arousal/Alertness: Awake/alert ?Behavior During Therapy: [facility] for tasks assessed/performed ?Overall Cognitive Status: Within Functional Limits for tasks assessed ?  ?  ?  ?  ?  ?  ?  ?  ?  ?  ?  ?  ?  ?  ?  ?  ?  ?  ?  ? ?  ?General Comments   ?Educated on safe ice use, no pivots, car transfers, , and TED hose during day. Also, encouraged walking every 1-2 hours during day. Educated on HEP with focus on mobility the first weeks. Discussed doing exercises within pain control and if pain increasing could decreased ROM, reps, and stop exercises as needed. Encouraged to perform ankle pumps frequently for blood flow  ?  ?Exercises Total Joint Exercises ?Ankle Circles/Pumps: AROM, Both, 10 reps, Supine ?Heel Slides: AROM, Right, 5 reps,  Supine ?Hip ABduction/ADduction: AROM, Right, 5 reps, Supine ?Long Arc Quad: AROM, Right, 5 reps, Seated  ? ?Assessment/Plan  ?  ?PT Assessment Patient needs continued PT services  ?PT Problem List Decreased strength;Decreased mobility;Decreased range of motion;Decreased activity tolerance;Decreased balance;Decreased knowledge of use of DME;Pain ? ?   ?  ?PT Treatment Interventions DME instruction;Therapeutic activities;Modalities;Gait training;Patient/family education;Therapeutic exercise;Stair training;Balance training;Functional mobility training   ? ?PT Goals (Current goals can be found in the Care Plan section)  ?Acute Rehab PT Goals ?Patient Stated Goal: return home ?PT Goal Formulation: With patient ?Time For Goal Achievement: 02/20/22 ?Potential to Achieve Goals: Good ? ?  ?Frequency 7X/week ?  ? ? ?Co-evaluation   ?  ?  ?  ?  ? ? ?  ?AM-PAC PT "6 Clicks" Mobility  ?Outcome Measure Help needed turning from your back to your side while in a flat bed without using bedrails?: None ?Help needed moving from lying on your back to sitting on the side of a flat bed without using bedrails?: A Little ?Help needed moving to and from a bed to a chair (including a wheelchair)?: A Little ?Help needed standing up from a chair using your arms (e.g., wheelchair or bedside chair)?: A Little ?Help needed to walk in hospital room?: A Little ?Help needed climbing 3-5  steps with a railing? : A Little ?6 Click Score: 19 ? ?  ?End of Session Equipment Utilized During Treatment: Gait belt ?Activity Tolerance: Patient tolerated treatment well ?Patient left: in chair;with call bell/phone within reach;with nursing/sitter in room (in PACU) ?Nurse Communication: Mobility status ?PT Visit Diagnosis: Other abnormalities of gait and mobility (R26.89) ?  ? ?Time: 1062-6948 ?PT Time Calculation (min) (ACUTE ONLY): 28 min ? ? ?Charges:   PT Evaluation ?$PT Eval Low Complexity: 1 Low ?PT Treatments ?$Gait Training: 8-22 mins ?  ?   ? ? ?Abran Richard,  PT ?Acute Rehab Services ?Pager [phone] ?[facility] [phone] ? ? ?Erica Cervantes ?02/06/2022, 4:42 PM ?

## 2022-02-06 NOTE — H&P (Signed)
TOTAL HIP ADMISSION H&P ? ?Patient is admitted for right total hip arthroplasty. ? ?Subjective: ? ?Chief Complaint: right hip pain ? ?HPI: Erica Cervantes, 76 y.o. female, has a history of pain and functional disability in the right hip(s) due to arthritis and patient has failed non-surgical conservative treatments for greater than 12 weeks to include NSAID's and/or analgesics, flexibility and strengthening excercises, use of assistive devices, and weight reduction as appropriate.  Onset of symptoms was gradual starting 5 years ago with rapidlly worsening course since that time.The patient noted no past surgery on the right hip(s).  Patient currently rates pain in the right hip at 9 out of 10 with activity. Patient has night pain, worsening of pain with activity and weight bearing, trendelenberg gait, pain with passive range of motion, and joint swelling. Patient has evidence of subchondral sclerosis, periarticular osteophytes, and joint space narrowing by imaging studies. This condition presents safety issues increasing the risk of falls. This patient has had  failure of all reasonable conservative care .  There is no current active infection. ? ?There are no problems to display for this patient. ? ?Past Medical History:  ?Diagnosis Date  ? 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 Depression   ? Diabetes mellitus without complication ([place])   ? GERD (gastroesophageal reflux disease)   ? Hypertension   ? Neuromuscular disorder ([place])   ? peripheral neuropathy  ?  ?Past Surgical History:  ?Procedure Laterality Date  ? ABDOMINAL HYSTERECTOMY  1990  ? CHOLECYSTECTOMY    ? age 22  ? DILATION AND CURETTAGE OF UTERUS    ? x 4  1980  ? TONSILLECTOMY    ? age 77  ?  ?No current facility-administered medications for this encounter.  ? ?Current Outpatient Medications  ?Medication Sig Dispense Refill Last Dose  ? acetaminophen (TYLENOL) 500 MG tablet Take 500 mg by mouth every 6 (six) hours as needed for mild pain or moderate pain.      ? amphetamine-dextroamphetamine (ADDERALL) 20 MG tablet Take 20-40 mg by mouth See admin instructions. Take 40 mg in the morning and 20 mg at 2 pm     ? atorvastatin (LIPITOR) 10 MG tablet Take 10 mg by mouth every Wednesday.     ? busPIRone (BUSPAR) 30 MG tablet Take 30 mg by mouth 3 (three) times daily.     ? cyclobenzaprine (FLEXERIL) 10 MG tablet Take 1 tablet (10 mg total) by mouth 2 (two) times daily as needed for muscle spasms. 20 tablet 0   ? cycloSPORINE (RESTASIS) 0.05 % ophthalmic emulsion Place 2 drops into both eyes 2 (two) times daily.     ? donepezil (ARICEPT) 5 MG tablet Take 5 mg by mouth at bedtime.     ? doxycycline (VIBRAMYCIN) 100 MG capsule Take 100 mg by mouth 2 (two) times daily.     ? DULoxetine (CYMBALTA) 60 MG capsule Take 120 mg by mouth daily at 12 noon.     ? folic acid (FOLVITE) 938 MCG tablet Take 800-2,400 mcg by mouth daily.     ? Guaifenesin (MUCINEX MAXIMUM STRENGTH) 1200 MG TB12 Take 1,200 mg by mouth 2 (two) times daily as needed (severe congestion).     ? lisinopril-hydrochlorothiazide (ZESTORETIC) 10-12.5 MG tablet Take 1 tablet by mouth daily.     ? meloxicam (MOBIC) 15 MG tablet Take 15 mg by mouth daily as needed for pain.     ? metFORMIN (GLUCOPHAGE-XR) 500 MG 24 hr tablet Take  1,000 mg by mouth 2 (two) times daily.     ? omeprazole (PRILOSEC) 20 MG capsule Take 20 mg by mouth daily.     ? triamcinolone cream (KENALOG) 0.1 % Apply 1 application. topically 3 (three) times daily as needed for itching or rash.     ? Calcium Carb-Cholecalciferol (CALCIUM 500+D PO) Take 1 tablet by mouth daily.     ? cholecalciferol (VITAMIN D3) 25 MCG (1000 UNIT) tablet Take 2,000 Units by mouth daily.     ? CHROMIUM-CINNAMON PO Take 1 tablet by mouth daily.     ? Cranberry 200 MG CAPS Take 200 mg by mouth daily.     ? GLUCOSAMINE-CHONDROITIN PO Take 1 tablet by mouth daily.     ? HYDROcodone-acetaminophen (NORCO) 10-325 MG tablet Take 1 tablet by mouth every 12 (twelve) hours as needed for  moderate pain.     ? Multiple Minerals-Vitamins (CAL MAG ZINC +D3) TABS Take 1 tablet by mouth daily.     ? Multiple Vitamin (MULTIVITAMIN WITH MINERALS) TABS tablet Take 1 tablet by mouth daily.     ? Multiple Vitamins-Minerals (HAIR/SKIN/NAILS/BIOTIN PO) Take 1 tablet by mouth daily.     ? Multiple Vitamins-Minerals (OCUVITE EYE HEALTH FORMULA) CAPS Take 1 tablet by mouth daily.     ? Probiotic Product (PROBIOTIC PO) Take 1 capsule by mouth daily.     ? ?Allergies  ?Allergen Reactions  ? Sulfa Antibiotics Shortness Of Breath  ? Ace Inhibitors Cough  ?  Pt tolerates lisinopril   ? Codeine Itching and Swelling  ? Penicillins   ?  Unknown reaction  ?  ? Statins   ?  Body aches   ? Tramadol   ?  Numbness   ? Zetia [Ezetimibe]   ?  Myalgia   ? Neosporin [Neomycin-Bacitracin Zn-Polymyx] Rash  ?  ?Social History  ? ?Tobacco Use  ? Smoking status: Never  ? Smokeless tobacco: Never  ?Substance Use Topics  ? Alcohol use: No  ?  ?Family History  ?Problem Relation Age of Onset  ? Cancer Mother   ? Cancer Father   ? Heart attack Brother   ? Polycystic kidney disease Brother   ? Cancer Other   ?  ? ?Review of Systems ?ROS: I have reviewed the patient's review of systems thoroughly and there are no positive responses as relates to the HPI.  ?Objective: ? ?Physical Exam ? ?Vital signs in last 24 hours: ?  ?Well-developed well-nourished patient in no acute distress. ?Alert and oriented x3 ?HEENT:within normal limits ?Cardiac: Regular rate and rhythm ?Pulmonary: Lungs clear to auscultation ?Abdomen: Soft and nontender.  Normal active bowel sounds ? ?Musculoskeletal: (Right hip: Painful range of motion.  Limited range of motion.  Limited internal rotation.  Neurovascular intact distally.  No erythema or warmth on the skin. ?Labs: ?Recent Results (from the past 2160 hour(s))  ?Glucose, capillary     Status: Abnormal  ? Collection Time: 02/03/22 11:23 AM  ?Result Value Ref Range  ? Glucose-Capillary 153 (H) 70 - 99 mg/dL  ?  Comment:  Glucose reference range applies only to samples taken after fasting for at least 8 hours.  ?Basic metabolic panel per protocol     Status: Abnormal  ? Collection Time: 02/03/22 11:47 AM  ?Result Value Ref Range  ? Sodium 140 135 - 145 mmol/L  ? Potassium 4.6 3.5 - 5.1 mmol/L  ? Chloride 103 98 - 111 mmol/L  ? CO2 28 22 - 32 mmol/L  ?  Glucose, Bld 174 (H) 70 - 99 mg/dL  ?  Comment: Glucose reference range applies only to samples taken after fasting for at least 8 hours.  ? BUN 33 (H) 8 - 23 mg/dL  ? Creatinine, Ser 1.05 (H) 0.44 - 1.00 mg/dL  ? Calcium 9.9 8.9 - 10.3 mg/dL  ? GFR, Estimated 55 (L) >60 mL/min  ?  Comment: (NOTE) ?Calculated using the CKD-EPI Creatinine Equation (2021) ?  ? Anion gap 9 5 - 15  ?  Comment: Performed at [facility], [place] [address] [place], [place] [address]  ?Hemoglobin A1c per protocol     Status: Abnormal  ? Collection Time: 02/03/22 11:47 AM  ?Result Value Ref Range  ? Hgb A1c MFr Bld 8.0 (H) 4.8 - 5.6 %  ?  Comment: (NOTE) ?Pre diabetes:          5.7%-6.4% ? ?Diabetes:              >6.4% ? ?Glycemic control for   <7.0% ?adults with diabetes ?  ? Mean Plasma Glucose 182.9 mg/dL  ?  Comment: Performed at [facility], [place] [address], [place], [place] [address]  ?CBC per protocol     Status: None  ? Collection Time: 02/03/22 11:47 AM  ?Result Value Ref Range  ? WBC 8.8 4.0 - 10.5 K/uL  ? RBC 4.53 3.87 - 5.11 MIL/uL  ? Hemoglobin 12.4 12.0 - 15.0 g/dL  ? HCT 39.9 36.0 - 46.0 %  ? MCV 88.1 80.0 - 100.0 fL  ? MCH 27.4 26.0 - 34.0 pg  ? MCHC 31.1 30.0 - 36.0 g/dL  ? RDW 14.4 11.5 - 15.5 %  ? Platelets 363 150 - 400 K/uL  ? nRBC 0.0 0.0 - 0.2 %  ?  Comment: Performed at [facility], [place] [address], [place], [place] [address]  ?Type and screen Order type and screen if day of surgery is less than 15 days from draw of preadmission visit or order morning of surgery if day of surgery is greater than 6 days from preadmission visit.     Status: None  ?  Collection Time: 02/03/22 11:47 AM  ?Result Value Ref Range  ? ABO/RH(D) O POS   ? Antibody Screen NEG   ? Sample Expiration 02/17/2022,2359   ? Extend sample reason    ?  NO TRANSFUSIONS OR PREGNANCY

## 2022-02-06 NOTE — Op Note (Signed)
PATIENT ID:      Erica Cervantes  ?MRN:     [id number] ?DOB/AGE:    [birth year] / 78 y.o. ? ? ?    OPERATIVE REPORT  ? ? DATE OF PROCEDURE:  02/06/2022   ?   ? PREOPERATIVE DIAGNOSIS:  RIGHT HIP DEGENERATIVE JOINT DISEASE  ?                                                     Estimated body mass index is 26.57 kg/m? as calculated from the following: ?  Height as of this encounter: '5\' 3"'$  (1.6 m). ?  Weight as of this encounter: 68 kg. ?   ? POSTOPERATIVE DIAGNOSIS:  RIGHT HIP DEGENERATIVE JOINT DISEASE  ?                                                        ? PROCEDURE:  1. Actis Right  total hip arthroplasty using a 48 mm DePuy Pinnacle gription ?Cup, [employer],  neutral liner, a +1.5 36 mm metal head,  and a #6 Actis stem, ?2.interpretation of multiple intraoperative fluoroscopic images ? ? SURGEON: Alta Corning  ? ? ASSISTANT:   Gaspar Skeeters PA-C  (present throughout entire procedure and necessary for timely completion of the procedure)  ?ANESTHESIA: spinal  ?BLOOD LOSS: 350cc ?Tranexamic Acid: 1 gram IV ?DRAINS: None ?COMPLICATIONS: None  ?  ?[place] with end-stage arthritis of the right hip.  ?X-rays show bone-on-bone arthritic changes. Despite conservative measures with observation, anti-inflammatory medicine, narcotics, use of a cane, has severe unremitting pain and can ambulate only less than 1 block before resting.  ?Patient desires elective right total hip arthroplasty to decrease pain and increase function. The risks, benefits, and alternatives were discussed at length including but not limited to the risks of infection, bleeding, nerve injury, stiffness, blood clots, the need for revision surgery, cardiopulmonary complications, among others, and they were willing to proceed.Benefits have been discussed. Questions answered.  ?   ?PROCEDURE IN DETAIL: The patient was identified by armband,  ?received preoperative IV antibiotics in the holding area at [facility]  [facility],  taken to the operating room , appropriate anesthetic monitors  ?were attached and spinal anesthesia was induced.  The patient was placed onto the hot bed and all bony prominences were well-padded.The right hip was prepped and draped for an anterior approach to the hip.  An incision was made and the subcutaneous dissection was down to the level of the tensor fascia.  The fascia was opened and finger dissected.  The bleeders coming across the anterior portion of the hip were identified and cauterized. Retractors were put in place above and below the femoral neck.  The capsule was opened and tagged and a provisional neck cut was made.  The head was removed and sized on the back table.  The acetabulum was sequentially reamed to a level of 27m and a 48 mm porous-coated pinnacle gription cup was hammered into place with 45? of lateral opening and 30? of anteversion.fluoroscopy was used to ensure this position of the cup. ? ?Attention was turned towards the femur where the leg  was externally rotated, extended, and adducted.  The femur was sequentially broached until a size of 6 Acvtis broach gave a perfect fit and fill.at this point a  1.5 mm metal hip ball was placed and the hip reduced.  Fluoroscopic images were taken to assess the leg length, fit and fill of the stem, and cup position.  We were happy with the construct at this point.  The 6 broach was removed and a final Actis stem with standard offset  and a 1.5 mm metal hip ball was placed and reduced.  Final images were taken to make certain there were happy with the position at this point. ? ? ?The capsule was closed with #1 Vicryl suture.  The tensor fascia was closed with 0 Vicryl suture.  The skin was then closed with combination of 0 and 2-0 Vicryl suture.  The top layer was with 3-0 Monocryl suture.  Benzoin and Steri-Strips were applied  and a sterile compressive dressing was applied and the patient taken to recovery room she noted be in satisfactory  condition.  Past medical Motion for the procedure was approximately 300 cc.  Of note Gaspar Skeeters was with the entire case and assisted by retraction of tissues, manipulation of the leg, and closing the minimize or time. ? ? ? ?Alta Corning ?04/06/2021, 6:15 PM ? ?Alta Corning ?02/06/2022, 11:38 AM ? ? ? ?  ?

## 2022-02-06 NOTE — Anesthesia Preprocedure Evaluation (Addendum)
Anesthesia Evaluation  ?Patient identified by MRN, date of birth, ID band ?Patient awake ? ? ? ?Reviewed: ?Allergy & Precautions, NPO status , Patient's Chart, lab work & pertinent test results ? ?Airway ?Mallampati: II ? ?TM Distance: >3 FB ?Neck ROM: Full ? ? ? Dental ? ?(+) Dental Advisory Given, Caps,  ?  ?Pulmonary ?neg pulmonary ROS,  ?  ?Pulmonary exam normal ?breath sounds clear to auscultation ? ? ? ? ? ? Cardiovascular ?hypertension, Pt. on medications ?Normal cardiovascular exam ?Rhythm:Regular Rate:Normal ? ? ?  ?Neuro/Psych ?PSYCHIATRIC DISORDERS Depression  Neuromuscular disease   ? GI/Hepatic ?Neg liver ROS, GERD  ,  ?Endo/Other  ?diabetes ? Renal/GU ?Renal disease  ? ?  ?Musculoskeletal ?negative musculoskeletal ROS ?(+)  ? Abdominal ?  ?Peds ? Hematology ?negative hematology ROS ?(+)   ?Anesthesia Other Findings ? ? Reproductive/Obstetrics ? ?  ? ? ? ? ? ? ? ? ? ? ? ? ? ?  ?  ? ? ? ? ? ? ? ?Anesthesia Physical ?Anesthesia Plan ? ?ASA: 3 ? ?Anesthesia Plan: Spinal  ? ?Post-op Pain Management: Tylenol PO (pre-op)*  ? ?Induction:  ? ?PONV Risk Score and Plan: 3 and Ondansetron, Dexamethasone, Propofol infusion and Treatment may vary due to age or medical condition ? ?Airway Management Planned: Simple Face Mask ? ?Additional Equipment: None ? ?Intra-op Plan:  ? ?Post-operative Plan:  ? ?Informed Consent: I have reviewed the patients History and Physical, chart, labs and discussed the procedure including the risks, benefits and alternatives for the proposed anesthesia with the patient or authorized representative who has indicated his/her understanding and acceptance.  ? ? ? ?Dental advisory given ? ?Plan Discussed with: CRNA ? ?Anesthesia Plan Comments:   ? ? ? ? ? ?Anesthesia Quick Evaluation ? ?

## 2022-02-06 NOTE — Transfer of Care (Signed)
Immediate Anesthesia Transfer of Care Note ? ?Patient: Erica Cervantes ? ?Procedure(s) Performed: Procedure(s): ?TOTAL HIP ARTHROPLASTY ANTERIOR APPROACH (Right) ? ?Patient Location: PACU ? ?Anesthesia Type:Spinal ? ?Level of Consciousness:  sedated, patient cooperative and responds to stimulation ? ?Airway & Oxygen Therapy:Patient Spontanous Breathing and Patient connected to face mask oxgen ? ?Post-op Assessment:  Report given to PACU RN and Post -op Vital signs reviewed and stable ? ?Post vital signs:  Reviewed and stable ? ?Last Vitals:  ?Vitals:  ? 02/06/22 0830  ?BP: (!) 145/77  ?Pulse: 78  ?Resp: 18  ?Temp: 37.2 ?C  ?SpO2: 100%  ? ? ?Complications: No apparent anesthesia complications ? ?

## 2022-02-06 NOTE — Anesthesia Postprocedure Evaluation (Signed)
Anesthesia Post Note ? ?Patient: Erica Cervantes ? ?Procedure(s) Performed: TOTAL HIP ARTHROPLASTY ANTERIOR APPROACH (Right: Hip) ? ?  ? ?Patient location during evaluation: PACU ?Anesthesia Type: Spinal ?Level of consciousness: awake and alert ?Pain management: pain level controlled ?Vital Signs Assessment: post-procedure vital signs reviewed and stable ?Respiratory status: spontaneous breathing ?Cardiovascular status: stable ?Anesthetic complications: no ? ? ?No notable events documented. ? ?Last Vitals:  ?Vitals:  ? 02/06/22 1245 02/06/22 1300  ?BP: (!) 97/58 110/66  ?Pulse: 75 66  ?Resp: 15 17  ?Temp:    ?SpO2: 98% 97%  ?  ?Last Pain:  ?Vitals:  ? 02/06/22 1300  ?TempSrc:   ?PainSc: 0-No pain  ? ? ?  ?  ?  ?  ?  ?  ? ?Nolon Nations ? ? ? ? ?

## 2022-02-07 LAB — TYPE AND SCREEN
ABO/RH(D): O POS
Antibody Screen: NEGATIVE

## 2022-02-08 DIAGNOSIS — I129 Hypertensive chronic kidney disease with stage 1 through stage 4 chronic kidney disease, or unspecified chronic kidney disease: Secondary | ICD-10-CM | POA: Diagnosis not present

## 2022-02-08 DIAGNOSIS — E7849 Other hyperlipidemia: Secondary | ICD-10-CM | POA: Diagnosis not present

## 2022-02-08 DIAGNOSIS — N183 Chronic kidney disease, stage 3 unspecified: Secondary | ICD-10-CM | POA: Diagnosis not present

## 2022-02-08 DIAGNOSIS — E1122 Type 2 diabetes mellitus with diabetic chronic kidney disease: Secondary | ICD-10-CM | POA: Diagnosis not present

## 2022-02-09 ENCOUNTER — Encounter (HOSPITAL_COMMUNITY): Payer: Self-pay | Admitting: Orthopedic Surgery

## 2022-02-18 DIAGNOSIS — I129 Hypertensive chronic kidney disease with stage 1 through stage 4 chronic kidney disease, or unspecified chronic kidney disease: Secondary | ICD-10-CM | POA: Diagnosis not present

## 2022-02-18 DIAGNOSIS — Z7984 Long term (current) use of oral hypoglycemic drugs: Secondary | ICD-10-CM | POA: Diagnosis not present

## 2022-02-18 DIAGNOSIS — N184 Chronic kidney disease, stage 4 (severe): Secondary | ICD-10-CM | POA: Diagnosis not present

## 2022-02-18 DIAGNOSIS — Z96641 Presence of right artificial hip joint: Secondary | ICD-10-CM | POA: Diagnosis not present

## 2022-02-18 DIAGNOSIS — K219 Gastro-esophageal reflux disease without esophagitis: Secondary | ICD-10-CM | POA: Diagnosis not present

## 2022-02-18 DIAGNOSIS — F32A Depression, unspecified: Secondary | ICD-10-CM | POA: Diagnosis not present

## 2022-02-18 DIAGNOSIS — Z4789 Encounter for other orthopedic aftercare: Secondary | ICD-10-CM | POA: Diagnosis not present

## 2022-02-18 DIAGNOSIS — Z7982 Long term (current) use of aspirin: Secondary | ICD-10-CM | POA: Diagnosis not present

## 2022-02-18 DIAGNOSIS — E1122 Type 2 diabetes mellitus with diabetic chronic kidney disease: Secondary | ICD-10-CM | POA: Diagnosis not present

## 2022-02-18 DIAGNOSIS — E1142 Type 2 diabetes mellitus with diabetic polyneuropathy: Secondary | ICD-10-CM | POA: Diagnosis not present

## 2022-02-19 DIAGNOSIS — Z471 Aftercare following joint replacement surgery: Secondary | ICD-10-CM | POA: Diagnosis not present

## 2022-03-04 ENCOUNTER — Other Ambulatory Visit: Payer: Self-pay

## 2022-03-04 ENCOUNTER — Emergency Department (HOSPITAL_COMMUNITY): Payer: HMO

## 2022-03-04 ENCOUNTER — Emergency Department (HOSPITAL_COMMUNITY)
Admission: EM | Admit: 2022-03-04 | Discharge: 2022-03-04 | Disposition: A | Discharge status: Home / Self Care | Payer: HMO | Attending: Emergency Medicine | Admitting: Emergency Medicine

## 2022-03-04 ENCOUNTER — Encounter (HOSPITAL_COMMUNITY): Payer: Self-pay

## 2022-03-04 DIAGNOSIS — S0003XA Contusion of scalp, initial encounter: Secondary | ICD-10-CM | POA: Diagnosis not present

## 2022-03-04 DIAGNOSIS — Z7982 Long term (current) use of aspirin: Secondary | ICD-10-CM | POA: Insufficient documentation

## 2022-03-04 DIAGNOSIS — Y92000 Kitchen of unspecified non-institutional (private) residence as  the place of occurrence of the external cause: Secondary | ICD-10-CM | POA: Insufficient documentation

## 2022-03-04 DIAGNOSIS — S0990XA Unspecified injury of head, initial encounter: Secondary | ICD-10-CM

## 2022-03-04 DIAGNOSIS — E1122 Type 2 diabetes mellitus with diabetic chronic kidney disease: Secondary | ICD-10-CM | POA: Insufficient documentation

## 2022-03-04 DIAGNOSIS — S79911A Unspecified injury of right hip, initial encounter: Secondary | ICD-10-CM | POA: Diagnosis present

## 2022-03-04 DIAGNOSIS — Y9301 Activity, walking, marching and hiking: Secondary | ICD-10-CM | POA: Insufficient documentation

## 2022-03-04 DIAGNOSIS — Z7984 Long term (current) use of oral hypoglycemic drugs: Secondary | ICD-10-CM | POA: Diagnosis not present

## 2022-03-04 DIAGNOSIS — Z79899 Other long term (current) drug therapy: Secondary | ICD-10-CM | POA: Diagnosis not present

## 2022-03-04 DIAGNOSIS — W19XXXA Unspecified fall, initial encounter: Secondary | ICD-10-CM

## 2022-03-04 DIAGNOSIS — N189 Chronic kidney disease, unspecified: Secondary | ICD-10-CM | POA: Diagnosis not present

## 2022-03-04 DIAGNOSIS — S7001XA Contusion of right hip, initial encounter: Secondary | ICD-10-CM | POA: Insufficient documentation

## 2022-03-04 DIAGNOSIS — W01198A Fall on same level from slipping, tripping and stumbling with subsequent striking against other object, initial encounter: Secondary | ICD-10-CM | POA: Diagnosis not present

## 2022-03-04 DIAGNOSIS — I129 Hypertensive chronic kidney disease with stage 1 through stage 4 chronic kidney disease, or unspecified chronic kidney disease: Secondary | ICD-10-CM | POA: Diagnosis not present

## 2022-03-04 DIAGNOSIS — S0083XA Contusion of other part of head, initial encounter: Secondary | ICD-10-CM | POA: Insufficient documentation

## 2022-03-04 DIAGNOSIS — R55 Syncope and collapse: Secondary | ICD-10-CM | POA: Diagnosis not present

## 2022-03-04 DIAGNOSIS — M25551 Pain in right hip: Secondary | ICD-10-CM | POA: Diagnosis not present

## 2022-03-04 NOTE — Discharge Instructions (Signed)
Your x-ray and CT studies today are negative for hip fracture or intracranial injury from your fall.  I have discussed your hip injury with [facility] and they want you to be seen by Dr. Berenice Primas tomorrow.  Please call his office for an appointment time for this office visit.  In the interim I would continue taking your doxycycline and your prescribed pain medication as needed.

## 2022-03-04 NOTE — ED Notes (Signed)
Pt assisted to bathroom during discharge

## 2022-03-04 NOTE — ED Triage Notes (Signed)
Pt presents to ED following fall Sunday am where she tripped and fell, hit her head, denies LOC, hit incision from hip surgery wants checked. Pt is on ASA

## 2022-03-04 NOTE — ED Provider Notes (Signed)
[facility] EMERGENCY DEPARTMENT Provider Note   CSN: [id number] Arrival date & time: 03/04/22  4010     History  Chief Complaint  Patient presents with   Fall    Erica Cervantes is a 76 y.o. female with a history of type 2 diabetes, GERD, hypertension, chronic kidney disease who is currently 3 weeks out from right total hip arthroplasty under the care of Dr. Berenice Primas presenting for evaluation of a trip and fall which occurred 4 days ago.  She was walking in her kitchen when she tripped over her cane and fell hitting her head and right hip against a cabinet.  She denies actually hitting the floor but reports having significant pain and swelling at the right side of her head where her head struck the cabinet.  She denies LOC at the time of the event.  She had significant swelling and bruising of her scalp but woke today with increased pain and swelling now involving her right temple area.  She also has pain along her ear on the right-hand side.  She reports having very mild erythema along her right hip incision line prior to falling, but since falling the redness has become increasingly worse and she now endorses a small amount of clear fluid from the incision edge.  She states she had external stitches which came out "on their own".  She is scheduled to see Dr. Berenice Primas for an office visit on June 1.  She has a history of MRSA and for this reason she had been placed on doxycycline postsurgery which she continues to take, this medication was prescribed on Feb 19, 2009 day course and she still has approximately 8 capsules in the bottle, but she denies missing any doses.  She has had no nausea or vomiting, denies dizziness or focal weakness since this fall.  She is on a daily adult aspirin, no other anticoagulants.  Denies fevers or chills, has had no Treatment for her injuries prior to arrival.  The history is provided by the patient.      Home Medications Prior to Admission medications   Medication  Sig Start Date End Date Taking? Authorizing Provider  acetaminophen (TYLENOL) 500 MG tablet Take 500 mg by mouth every 6 (six) hours as needed for mild pain or moderate pain.    [provider]  amphetamine-dextroamphetamine (ADDERALL) 20 MG tablet Take 20-40 mg by mouth See admin instructions. Take 40 mg in the morning and 20 mg at 2 pm 10/26/17   [provider]  aspirin EC 325 MG tablet Take 1 tablet (325 mg total) by mouth 2 (two) times daily after a meal. Take x 1 month post op to decrease risk of blood clots. 02/06/22   Gary Fleet, PA-C  atorvastatin (LIPITOR) 10 MG tablet Take 10 mg by mouth every Wednesday.    [provider]  busPIRone (BUSPAR) 30 MG tablet Take 30 mg by mouth 3 (three) times daily. 08/23/17   [provider]  Calcium Carb-Cholecalciferol (CALCIUM 500+D PO) Take 1 tablet by mouth daily.    [provider]  cholecalciferol (VITAMIN D3) 25 MCG (1000 UNIT) tablet Take 2,000 Units by mouth daily.    [provider]  CHROMIUM-CINNAMON PO Take 1 tablet by mouth daily.    [provider]  Cranberry 200 MG CAPS Take 200 mg by mouth daily.    [provider]  cycloSPORINE (RESTASIS) 0.05 % ophthalmic emulsion Place 2 drops into both eyes 2 (two) times daily.  [provider]  docusate sodium (COLACE) 100 MG capsule Take 1 capsule (100 mg total) by mouth 2 (two) times daily. 02/06/22   Gary Fleet, PA-C  donepezil (ARICEPT) 5 MG tablet Take 5 mg by mouth at bedtime.    [provider]  DULoxetine (CYMBALTA) 60 MG capsule Take 120 mg by mouth daily at 12 noon. 09/25/17   [provider]  folic acid (FOLVITE) 086 MCG tablet Take 800-2,400 mcg by mouth daily.    [provider]  GLUCOSAMINE-CHONDROITIN PO Take 1 tablet by mouth daily.    [provider]  Guaifenesin (MUCINEX MAXIMUM STRENGTH) 1200 MG TB12 Take 1,200 mg by mouth 2 (two) times daily as needed (severe  congestion).    [provider]  lisinopril-hydrochlorothiazide (ZESTORETIC) 10-12.5 MG tablet Take 1 tablet by mouth daily.    [provider]  metFORMIN (GLUCOPHAGE-XR) 500 MG 24 hr tablet Take 1,000 mg by mouth 2 (two) times daily. 09/11/17   [provider]  Multiple Minerals-Vitamins (CAL MAG ZINC +D3) TABS Take 1 tablet by mouth daily.    [provider]  Multiple Vitamin (MULTIVITAMIN WITH MINERALS) TABS tablet Take 1 tablet by mouth daily.    [provider]  Multiple Vitamins-Minerals (HAIR/SKIN/NAILS/BIOTIN PO) Take 1 tablet by mouth daily.    [provider]  Multiple Vitamins-Minerals ([place]) CAPS Take 1 tablet by mouth daily.    [provider]  omeprazole (PRILOSEC) 20 MG capsule Take 20 mg by mouth daily.    [provider]  oxyCODONE-acetaminophen (PERCOCET/ROXICET) 5-325 MG tablet Take 1-2 tablets by mouth every 6 (six) hours as needed for severe pain. 02/06/22   Gary Fleet, PA-C  Probiotic Product (PROBIOTIC PO) Take 1 capsule by mouth daily.    [provider]  tiZANidine (ZANAFLEX) 2 MG tablet Take 1 tablet (2 mg total) by mouth every 8 (eight) hours as needed for muscle spasms. 02/06/22   Gary Fleet, PA-C  triamcinolone cream (KENALOG) 0.1 % Apply 1 application. topically 3 (three) times daily as needed for itching or rash. 12/29/21   [provider]      Allergies    Sulfa antibiotics, Ace inhibitors, Codeine, Penicillins, Statins, Tramadol, Zetia [ezetimibe], and Neosporin [neomycin-bacitracin zn-polymyx]    Review of Systems   Review of Systems  Constitutional:  Negative for chills and fever.  HENT: Negative.    Respiratory: Negative.    Cardiovascular: Negative.   Gastrointestinal:  Negative for nausea and vomiting.  Musculoskeletal:  Positive for arthralgias. Negative for joint swelling and myalgias.  Skin:  Positive for color change.  Neurological:   Positive for headaches. Negative for weakness and numbness.   Physical Exam Updated Vital Signs BP (!) 150/85 (BP Location: Left Arm)   Pulse 83   Temp 98.3 F (36.8 C) (Oral)   Resp 16   Ht 5' 3.5" (1.613 m)   Wt 65.8 kg   SpO2 100%   BMI 25.28 kg/m  Physical Exam Vitals and nursing note reviewed.  Constitutional:      Appearance: She is well-developed.  HENT:     Head: Normocephalic. No Battle's sign.     Comments: Tender hematoma noted right frontal/temporal scalp.  No laceration is present.  She also has some faint periorbital ecchymosis, no edema, no facial bone tenderness. Eyes:     Conjunctiva/sclera: Conjunctivae normal.  Cardiovascular:     Rate and Rhythm: Normal rate and regular rhythm.     Heart sounds: Normal heart  sounds.  Pulmonary:     Effort: Pulmonary effort is normal.     Breath sounds: Normal breath sounds. No wheezing.  Abdominal:     General: Bowel sounds are normal.     Palpations: Abdomen is soft.     Tenderness: There is no abdominal tenderness.  Musculoskeletal:        General: Normal range of motion.     Cervical back: Normal range of motion.  Skin:    General: Skin is warm and dry.     Findings: Erythema present.     Comments: Erythema noted at the right hip surgical incision line.  Refer to the attached photo.  Neurological:     Mental Status: She is alert.     ED Results / Procedures / Treatments   Labs (all labs ordered are listed, but only abnormal results are displayed) Labs Reviewed - No data to display  EKG None  Radiology CT Head Wo Contrast  Result Date: 03/04/2022 CLINICAL DATA:  Head trauma, fell on Sunday, tripped and fell striking her head, denies loss of consciousness EXAM: CT HEAD WITHOUT CONTRAST TECHNIQUE: Contiguous axial images were obtained from the base of the skull through the vertex without intravenous contrast. RADIATION DOSE REDUCTION: This exam was performed according to the departmental dose-optimization  program which includes automated exposure control, adjustment of the mA and/or kV according to patient size and/or use of iterative reconstruction technique. COMPARISON:  None available FINDINGS: Brain: Generalized atrophy. Normal ventricular morphology. No midline shift or mass effect. Small vessel chronic ischemic changes of deep cerebral white matter. No intracranial hemorrhage, mass lesion, evidence of acute infarction, or extra-axial fluid collection. Vascular: No hyperdense vessels. Mild atherosclerotic calcification of internal carotid arteries at skull base Skull: Demineralized but intact. Minimal hyperostosis frontalis interna. Small RIGHT frontal scalp hematoma. Sinuses/Orbits: Clear Other: N/A IMPRESSION: Atrophy with small vessel chronic ischemic changes of deep cerebral white matter. No acute intracranial abnormalities. Small RIGHT frontal scalp hematoma. Electronically Signed   By: Lavonia Dana M.D.   On: 03/04/2022 11:08   DG Hip Unilat W or Wo Pelvis 2-3 Views Right  Result Date: 03/04/2022 CLINICAL DATA:  Fall with right hip tenderness. EXAM: DG HIP (WITH OR WITHOUT PELVIS) 2-3V RIGHT COMPARISON:  None Available. FINDINGS: Bones are diffusely demineralized. SI joints and symphysis pubis unremarkable. Status post right total hip replacement. No evidence for an acute fracture in the bony anatomy of the pelvis. No evidence for periprosthetic right femur fracture. IMPRESSION: No acute bony abnormality. Electronically Signed   By: Misty Stanley M.D.   On: 03/04/2022 10:41    Procedures Procedures    Medications Ordered in ED Medications - No data to display  ED Course/ Medical Decision Making/ A&P                           Medical Decision Making Patient who tripped and fell 4 days ago injuring the right side of her scalp and her incision line at her right hip from her recent right hip arthroplasty.  I suspect her new right facial/perioral orbital bruising is secondary to the healing and  spreading right scalp hematoma.  She has no history of facial trauma and her exam is reassuring, she has no tenderness of her facial or periorbital bones.  She has no focal neurodeficits on exam.  She does have some erythema at her right incision line although the incision does appear fairly intact.  There is  a tiny bead of clear fluid, no purulence is noted.  Patient is currently on doxycycline.  She has no constitutional symptoms, fevers or chills.  Patient was encouraged to continue the doxycycline that she currently has pending her reevaluation by Dr. Berenice Primas tomorrow.  Amount and/or Complexity of Data Reviewed Radiology: ordered and independent interpretation performed.    Details: No intracranial CT injury, hip prosthesis is intact. Discussion of management or test interpretation with external provider(s): Discussed patient's new hip injury with Dr. Mable Fill of [facility] orthopedics.  He encouraged patient to get seen by Dr. Berenice Primas who will be in his office tomorrow.  This information was relayed to the patient, she knows to call for an appointment time with him tomorrow which she agrees to do.  Risk Prescription drug management.           Final Clinical Impression(s) / ED Diagnoses Final diagnoses:  Fall, initial encounter  Minor head injury, initial encounter  Contusion of right hip, initial encounter    Rx / DC Orders ED Discharge Orders     None         Landis Martins 03/04/22 1146    Carmin Muskrat, MD 03/04/22 1552

## 2022-03-12 DIAGNOSIS — Z9889 Other specified postprocedural states: Secondary | ICD-10-CM | POA: Diagnosis not present

## 2022-03-17 DIAGNOSIS — L570 Actinic keratosis: Secondary | ICD-10-CM | POA: Diagnosis not present

## 2022-03-18 DIAGNOSIS — H40053 Ocular hypertension, bilateral: Secondary | ICD-10-CM | POA: Diagnosis not present

## 2022-04-16 DIAGNOSIS — Z96641 Presence of right artificial hip joint: Secondary | ICD-10-CM | POA: Diagnosis not present

## 2022-04-16 DIAGNOSIS — Z471 Aftercare following joint replacement surgery: Secondary | ICD-10-CM | POA: Diagnosis not present

## 2022-04-17 DIAGNOSIS — Z1329 Encounter for screening for other suspected endocrine disorder: Secondary | ICD-10-CM | POA: Diagnosis not present

## 2022-04-17 DIAGNOSIS — E1165 Type 2 diabetes mellitus with hyperglycemia: Secondary | ICD-10-CM | POA: Diagnosis not present

## 2022-04-17 DIAGNOSIS — E7849 Other hyperlipidemia: Secondary | ICD-10-CM | POA: Diagnosis not present

## 2022-04-17 DIAGNOSIS — I10 Essential (primary) hypertension: Secondary | ICD-10-CM | POA: Diagnosis not present

## 2022-04-17 DIAGNOSIS — M797 Fibromyalgia: Secondary | ICD-10-CM | POA: Diagnosis not present

## 2022-04-17 DIAGNOSIS — K2100 Gastro-esophageal reflux disease with esophagitis, without bleeding: Secondary | ICD-10-CM | POA: Diagnosis not present

## 2022-04-17 DIAGNOSIS — F900 Attention-deficit hyperactivity disorder, predominantly inattentive type: Secondary | ICD-10-CM | POA: Diagnosis not present

## 2022-04-17 DIAGNOSIS — N1831 Chronic kidney disease, stage 3a: Secondary | ICD-10-CM | POA: Diagnosis not present

## 2022-04-21 DIAGNOSIS — L280 Lichen simplex chronicus: Secondary | ICD-10-CM | POA: Diagnosis not present

## 2022-04-21 DIAGNOSIS — Z6826 Body mass index (BMI) 26.0-26.9, adult: Secondary | ICD-10-CM | POA: Diagnosis not present

## 2022-04-21 DIAGNOSIS — F900 Attention-deficit hyperactivity disorder, predominantly inattentive type: Secondary | ICD-10-CM | POA: Diagnosis not present

## 2022-04-21 DIAGNOSIS — I10 Essential (primary) hypertension: Secondary | ICD-10-CM | POA: Diagnosis not present

## 2022-04-21 DIAGNOSIS — L03811 Cellulitis of head [any part, except face]: Secondary | ICD-10-CM | POA: Diagnosis not present

## 2022-04-21 DIAGNOSIS — M797 Fibromyalgia: Secondary | ICD-10-CM | POA: Diagnosis not present

## 2022-04-21 DIAGNOSIS — E1122 Type 2 diabetes mellitus with diabetic chronic kidney disease: Secondary | ICD-10-CM | POA: Diagnosis not present

## 2022-04-21 DIAGNOSIS — R4582 Worries: Secondary | ICD-10-CM | POA: Diagnosis not present

## 2022-04-21 DIAGNOSIS — R413 Other amnesia: Secondary | ICD-10-CM | POA: Diagnosis not present

## 2022-04-21 DIAGNOSIS — M1611 Unilateral primary osteoarthritis, right hip: Secondary | ICD-10-CM | POA: Diagnosis not present

## 2022-04-21 DIAGNOSIS — E7849 Other hyperlipidemia: Secondary | ICD-10-CM | POA: Diagnosis not present

## 2022-04-21 DIAGNOSIS — K2100 Gastro-esophageal reflux disease with esophagitis, without bleeding: Secondary | ICD-10-CM | POA: Diagnosis not present

## 2022-05-11 DIAGNOSIS — N183 Chronic kidney disease, stage 3 unspecified: Secondary | ICD-10-CM | POA: Diagnosis not present

## 2022-05-11 DIAGNOSIS — E7849 Other hyperlipidemia: Secondary | ICD-10-CM | POA: Diagnosis not present

## 2022-05-11 DIAGNOSIS — I129 Hypertensive chronic kidney disease with stage 1 through stage 4 chronic kidney disease, or unspecified chronic kidney disease: Secondary | ICD-10-CM | POA: Diagnosis not present

## 2022-05-11 DIAGNOSIS — E1122 Type 2 diabetes mellitus with diabetic chronic kidney disease: Secondary | ICD-10-CM | POA: Diagnosis not present

## 2022-05-21 DIAGNOSIS — H02052 Trichiasis without entropian right lower eyelid: Secondary | ICD-10-CM | POA: Diagnosis not present

## 2022-06-18 DIAGNOSIS — M5451 Vertebrogenic low back pain: Secondary | ICD-10-CM | POA: Diagnosis not present

## 2022-06-18 DIAGNOSIS — M7918 Myalgia, other site: Secondary | ICD-10-CM | POA: Diagnosis not present

## 2022-06-18 DIAGNOSIS — M4316 Spondylolisthesis, lumbar region: Secondary | ICD-10-CM | POA: Diagnosis not present

## 2022-07-15 DIAGNOSIS — L281 Prurigo nodularis: Secondary | ICD-10-CM | POA: Diagnosis not present

## 2022-07-15 DIAGNOSIS — L0100 Impetigo, unspecified: Secondary | ICD-10-CM | POA: Diagnosis not present

## 2022-08-15 IMAGING — MR MR LUMBAR SPINE W/O CM
5 series · 30 of 48 positions shown · non-contrast
Comparison: MRI of the lumbar spine October 27, 2017.

CLINICAL DATA: Lumbar stenosis with neurogenic claudication.

EXAM:
MRI LUMBAR SPINE WITHOUT CONTRAST
TECHNIQUE: Multiplanar, multisequence MR imaging of the lumbar spine was
performed. No intravenous contrast was administered.

[Series 5: T2 · sagittal · 4.0mm · 0.68mm/px · 6 of 17 slices shown (1 of 2)]
[im 1/17]
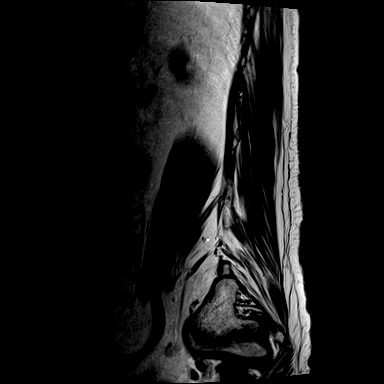
[im 4/17]
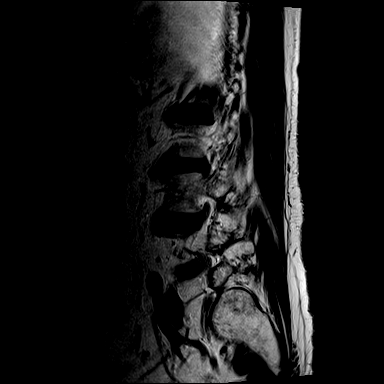
[im 7/17]
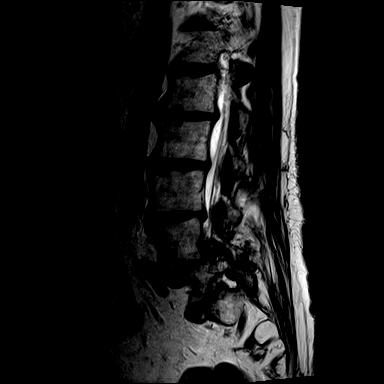
[im 10/17]
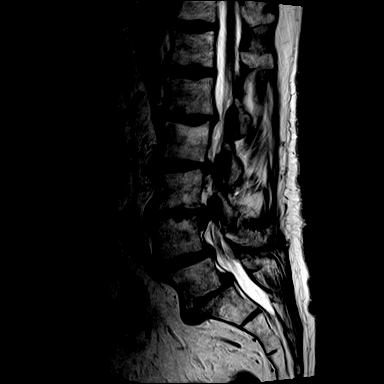
[im 13/17]
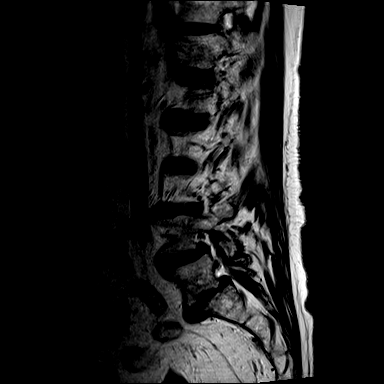
[im 17/17]
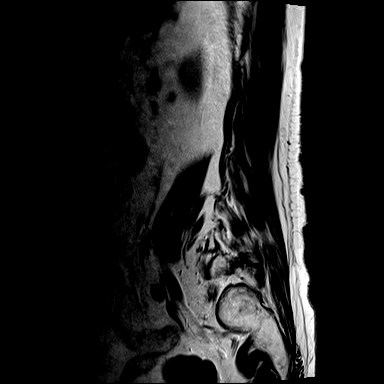

[Series 6: T1 · sagittal · 4.0mm · 0.81mm/px · 7 of 15 slices shown (1 of 2)]
[im 1/15]
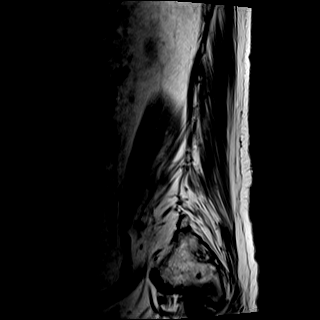
[im 3/15]
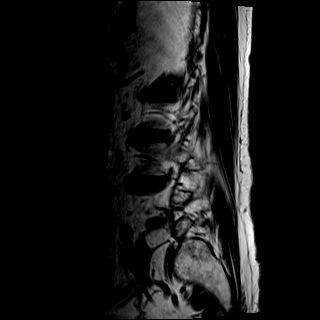
[im 5/15]
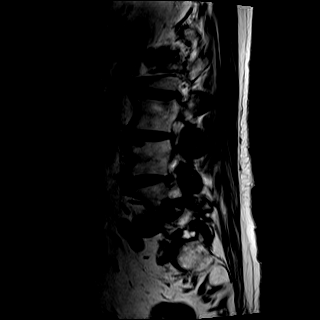
[im 8/15]
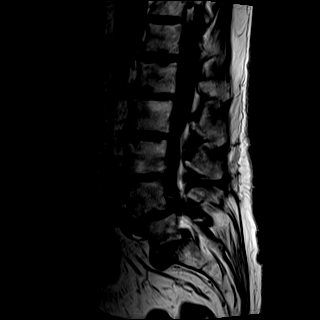
[im 10/15]
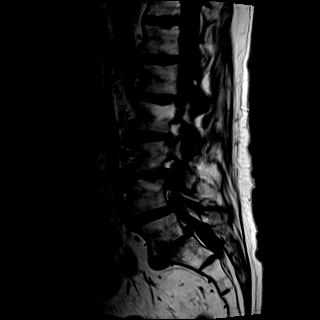
[im 12/15]
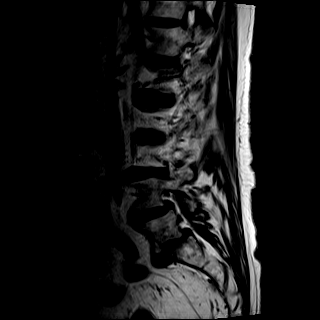
[im 15/15]
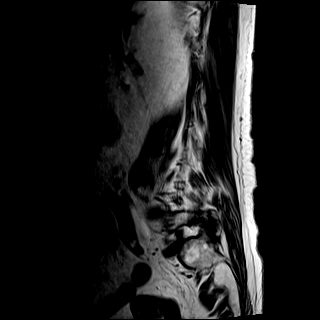

[Series 7: STIR · sagittal · 4.0mm · 0.51mm/px · 1 of 15 slices shown]
[im 1/15]
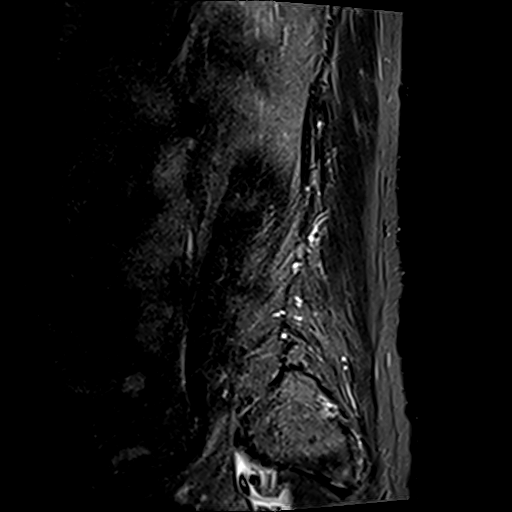

[Series 8: T2 · axial · 4.0mm · 0.70mm/px · z∈[-118,+76]mm · 8 of 31 slices shown (2 of 2)]
[im 1/31]
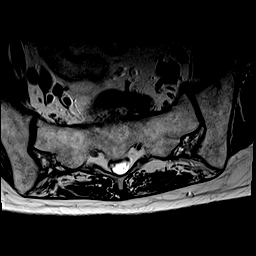
[im 5/31]
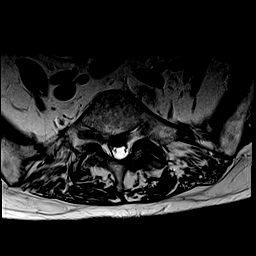
[im 10/31]
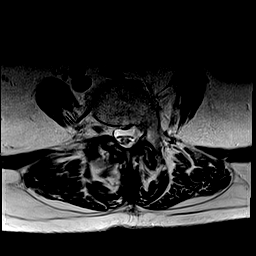
[im 14/31]
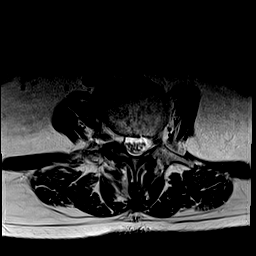
[im 17/31]
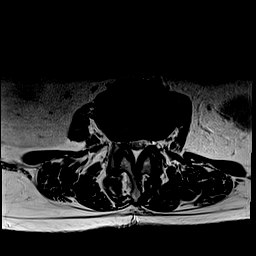
[im 21/31]
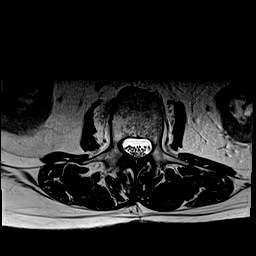
[im 26/31]
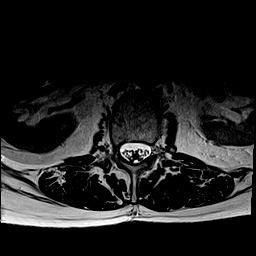
[im 31/31]
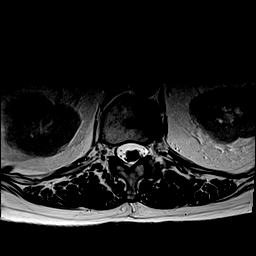

[Series 9: T1 · axial · 4.0mm · 0.35mm/px · z∈[-118,+76]mm · 8 of 31 slices shown (2 of 2)]
[im 1/31]
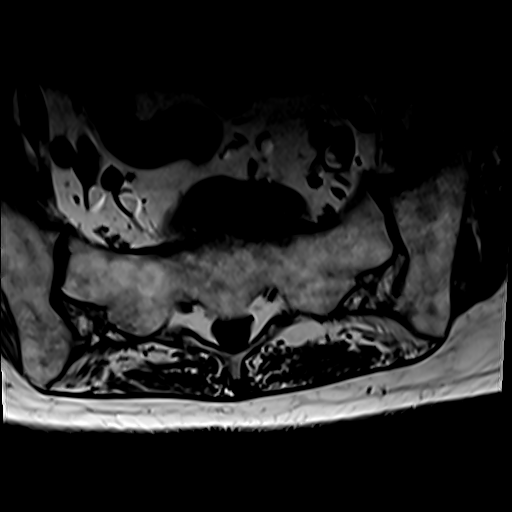
[im 5/31]
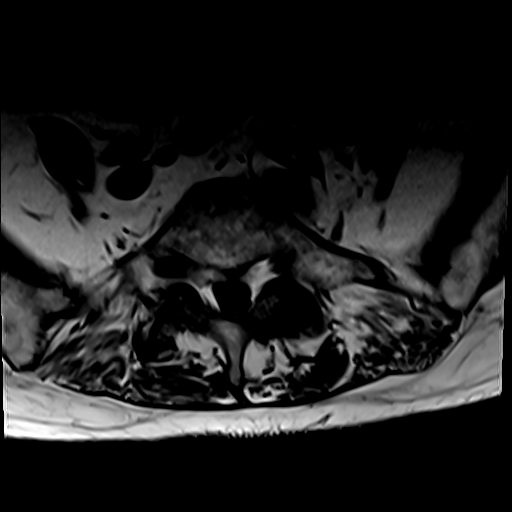
[im 10/31]
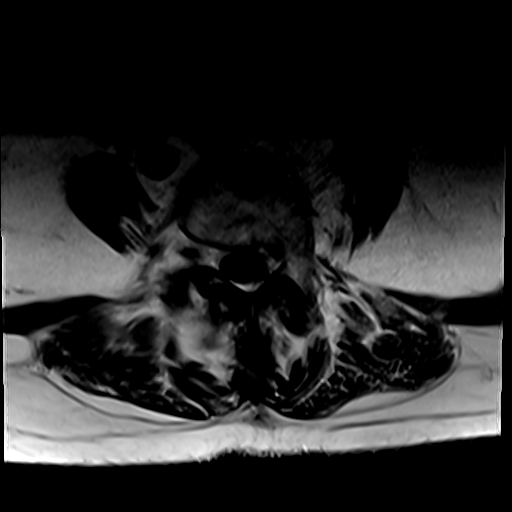
[im 14/31]
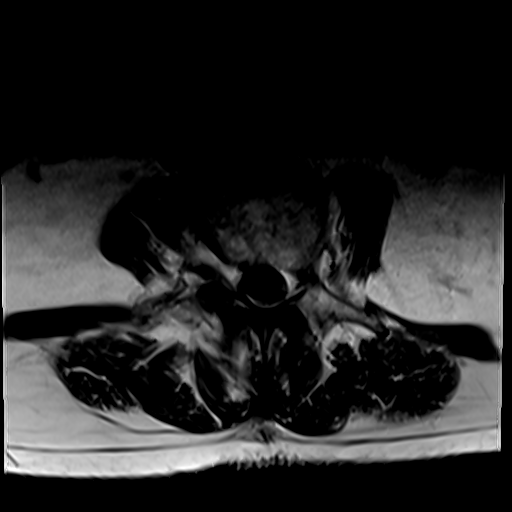
[im 17/31]
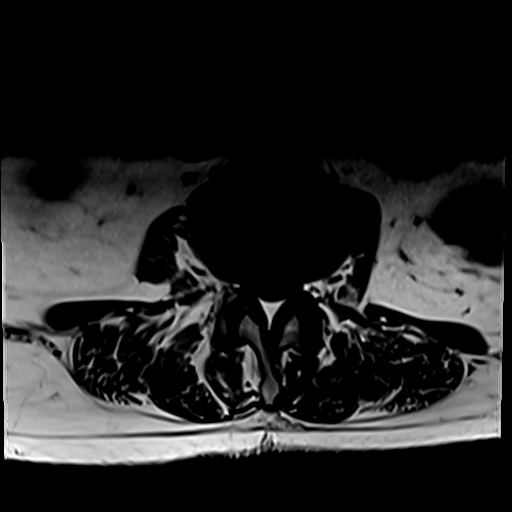
[im 21/31]
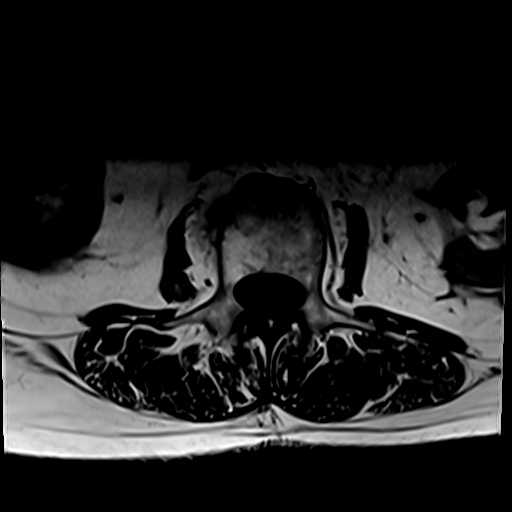
[im 26/31]
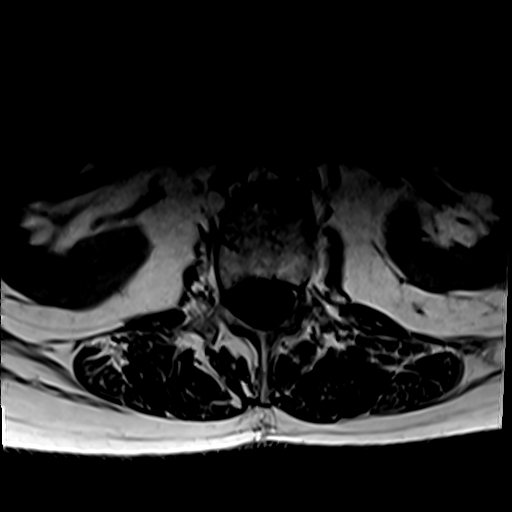
[im 31/31]
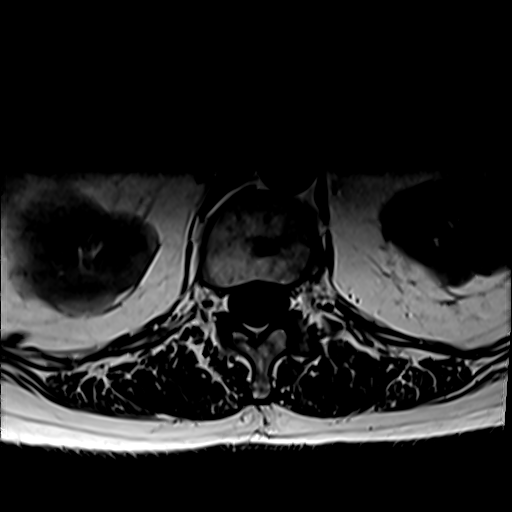

[30 of 48 positions shown; findings below may reference images not displayed]

FINDINGS: Segmentation:  Standard.

Alignment: New small anterolisthesis of L4 over L5 and mildly
increased anterolisthesis of L5 over S1. Small retrolisthesis of L3
over L4. Levoconvex scoliosis of the lumbar spine.

Vertebrae: No fracture, evidence of discitis, or bone lesion.
Endplate degenerative changes, more pronounced at L3-4 and L5-S1.

Conus medullaris and cauda equina: Conus extends to the L1 level.
Conus and cauda equina appear normal.

Paraspinal and other soft tissues: Negative.

Disc levels:

T12-L1: Mild facet degenerative changes. No spinal canal or neural
foraminal stenosis.

L1-2: Loss of disc height, shallow disc bulge and mild facet
degenerative changes. Mild right neural foraminal narrowing.
Significant spinal canal stenosis. Findings have progressed from
prior MRI.

L2-3: Loss of disc height, right asymmetric disc bulge and moderate
facet degenerative changes with ligamentum flavum redundancy
resulting mild spinal canal stenosis with narrowing of the right
subarticular zone and mild right neural foraminal narrowing.
Findings have progressed from prior MRI.

L3-4: Right asymmetric disc bulge with superimposed inferiorly
migrating right central disc extrusion, hypertrophic facet
degenerative changes and ligamentum flavum redundancy resulting
moderate to severe spinal canal stenosis and mild right neural
foraminal narrowing. Findings have significantly progressed from
prior MRI.

L4-5: Loss of disc height, disc bulge with superimposed right
foraminal/far lateral disc protrusion, prominent hypertrophic facet
degenerative changes and ligamentum flavum redundancy resulting in
moderate to severe spinal canal stenosis, mild right and severe left
neural foraminal narrowing impinging on the exiting left L4 nerve
root. Findings have progressed since prior MRI.

L5-S1: Loss of disc height, disc bulge/disc uncovering with
superimposed central disc protrusion contacting the bilateral
traversing S1 nerve roots. Prominent hypertrophic facet degenerative
changes, right greater than left. Mild bilateral neural foraminal
narrowing. No significant spinal canal stenosis. Progressed endplate
changes and disc height loss from prior MRI.
IMPRESSION: 1. Multilevel degenerative changes of the lumbar spine as described,
progressed from prior MRI.
2. Moderate to severe spinal canal stenosis at L3-4 and L4-5.
3. Severe left neural foraminal narrowing at L4-5 impinging on the
exiting left L4 nerve root.
4. Central disc protrusion at L5-S1 contacting the bilateral
traversing S1 nerve roots.

## 2022-08-21 ENCOUNTER — Encounter (INDEPENDENT_AMBULATORY_CARE_PROVIDER_SITE_OTHER): Payer: Self-pay | Admitting: *Deleted

## 2022-10-20 DIAGNOSIS — E119 Type 2 diabetes mellitus without complications: Secondary | ICD-10-CM | POA: Diagnosis not present

## 2022-10-20 DIAGNOSIS — I517 Cardiomegaly: Secondary | ICD-10-CM | POA: Diagnosis not present

## 2022-10-20 DIAGNOSIS — J811 Chronic pulmonary edema: Secondary | ICD-10-CM | POA: Diagnosis not present

## 2022-10-20 DIAGNOSIS — Z885 Allergy status to narcotic agent status: Secondary | ICD-10-CM | POA: Diagnosis not present

## 2022-10-20 DIAGNOSIS — Z882 Allergy status to sulfonamides status: Secondary | ICD-10-CM | POA: Diagnosis not present

## 2022-10-20 DIAGNOSIS — R197 Diarrhea, unspecified: Secondary | ICD-10-CM | POA: Diagnosis not present

## 2022-10-20 DIAGNOSIS — Z1152 Encounter for screening for COVID-19: Secondary | ICD-10-CM | POA: Diagnosis not present

## 2022-10-20 DIAGNOSIS — D649 Anemia, unspecified: Secondary | ICD-10-CM | POA: Diagnosis not present

## 2022-10-20 DIAGNOSIS — Z7984 Long term (current) use of oral hypoglycemic drugs: Secondary | ICD-10-CM | POA: Diagnosis not present

## 2022-10-20 DIAGNOSIS — Z880 Allergy status to penicillin: Secondary | ICD-10-CM | POA: Diagnosis not present

## 2022-10-20 DIAGNOSIS — R059 Cough, unspecified: Secondary | ICD-10-CM | POA: Diagnosis not present

## 2022-10-20 DIAGNOSIS — J90 Pleural effusion, not elsewhere classified: Secondary | ICD-10-CM | POA: Diagnosis not present

## 2022-10-20 DIAGNOSIS — M797 Fibromyalgia: Secondary | ICD-10-CM | POA: Diagnosis not present

## 2022-10-20 DIAGNOSIS — Z20822 Contact with and (suspected) exposure to covid-19: Secondary | ICD-10-CM | POA: Diagnosis not present

## 2022-10-21 DIAGNOSIS — R030 Elevated blood-pressure reading, without diagnosis of hypertension: Secondary | ICD-10-CM | POA: Diagnosis not present

## 2022-10-21 DIAGNOSIS — Z6826 Body mass index (BMI) 26.0-26.9, adult: Secondary | ICD-10-CM | POA: Diagnosis not present

## 2022-10-21 DIAGNOSIS — R0602 Shortness of breath: Secondary | ICD-10-CM | POA: Diagnosis not present

## 2022-10-21 DIAGNOSIS — R5383 Other fatigue: Secondary | ICD-10-CM | POA: Diagnosis not present

## 2022-10-21 DIAGNOSIS — D649 Anemia, unspecified: Secondary | ICD-10-CM | POA: Diagnosis not present

## 2022-10-26 DIAGNOSIS — D649 Anemia, unspecified: Secondary | ICD-10-CM | POA: Diagnosis not present

## 2022-10-26 DIAGNOSIS — D519 Vitamin B12 deficiency anemia, unspecified: Secondary | ICD-10-CM | POA: Diagnosis not present

## 2022-10-26 DIAGNOSIS — D529 Folate deficiency anemia, unspecified: Secondary | ICD-10-CM | POA: Diagnosis not present

## 2022-10-27 ENCOUNTER — Encounter (INDEPENDENT_AMBULATORY_CARE_PROVIDER_SITE_OTHER): Payer: Self-pay | Admitting: *Deleted

## 2022-10-28 DIAGNOSIS — I340 Nonrheumatic mitral (valve) insufficiency: Secondary | ICD-10-CM | POA: Diagnosis not present

## 2022-10-28 DIAGNOSIS — I272 Pulmonary hypertension, unspecified: Secondary | ICD-10-CM | POA: Diagnosis not present

## 2022-10-28 DIAGNOSIS — R0602 Shortness of breath: Secondary | ICD-10-CM | POA: Diagnosis not present

## 2022-10-29 ENCOUNTER — Inpatient Hospital Stay (HOSPITAL_COMMUNITY): Payer: PPO

## 2022-10-29 ENCOUNTER — Inpatient Hospital Stay (HOSPITAL_COMMUNITY)
Admission: EM | Admit: 2022-10-29 | Discharge: 2022-11-06 | DRG: 286 | Disposition: A | Discharge status: Home Health | Payer: PPO | Attending: Internal Medicine | Admitting: Internal Medicine

## 2022-10-29 ENCOUNTER — Emergency Department (HOSPITAL_COMMUNITY): Payer: PPO

## 2022-10-29 ENCOUNTER — Other Ambulatory Visit: Payer: Self-pay

## 2022-10-29 DIAGNOSIS — E8770 Fluid overload, unspecified: Secondary | ICD-10-CM

## 2022-10-29 DIAGNOSIS — I5021 Acute systolic (congestive) heart failure: Secondary | ICD-10-CM | POA: Diagnosis present

## 2022-10-29 DIAGNOSIS — R531 Weakness: Secondary | ICD-10-CM | POA: Diagnosis not present

## 2022-10-29 DIAGNOSIS — I5023 Acute on chronic systolic (congestive) heart failure: Secondary | ICD-10-CM | POA: Diagnosis present

## 2022-10-29 DIAGNOSIS — R102 Pelvic and perineal pain: Secondary | ICD-10-CM | POA: Insufficient documentation

## 2022-10-29 DIAGNOSIS — E876 Hypokalemia: Secondary | ICD-10-CM | POA: Diagnosis not present

## 2022-10-29 DIAGNOSIS — I509 Heart failure, unspecified: Secondary | ICD-10-CM | POA: Insufficient documentation

## 2022-10-29 DIAGNOSIS — G8929 Other chronic pain: Secondary | ICD-10-CM | POA: Diagnosis present

## 2022-10-29 DIAGNOSIS — E1165 Type 2 diabetes mellitus with hyperglycemia: Secondary | ICD-10-CM | POA: Diagnosis present

## 2022-10-29 DIAGNOSIS — L03211 Cellulitis of face: Secondary | ICD-10-CM | POA: Diagnosis not present

## 2022-10-29 DIAGNOSIS — Z66 Do not resuscitate: Secondary | ICD-10-CM | POA: Diagnosis not present

## 2022-10-29 DIAGNOSIS — I2510 Atherosclerotic heart disease of native coronary artery without angina pectoris: Secondary | ICD-10-CM | POA: Diagnosis not present

## 2022-10-29 DIAGNOSIS — E1122 Type 2 diabetes mellitus with diabetic chronic kidney disease: Secondary | ICD-10-CM | POA: Diagnosis not present

## 2022-10-29 DIAGNOSIS — J90 Pleural effusion, not elsewhere classified: Secondary | ICD-10-CM | POA: Diagnosis not present

## 2022-10-29 DIAGNOSIS — R7989 Other specified abnormal findings of blood chemistry: Secondary | ICD-10-CM | POA: Insufficient documentation

## 2022-10-29 DIAGNOSIS — I2729 Other secondary pulmonary hypertension: Secondary | ICD-10-CM | POA: Diagnosis present

## 2022-10-29 DIAGNOSIS — F0390 Unspecified dementia without behavioral disturbance: Secondary | ICD-10-CM | POA: Diagnosis present

## 2022-10-29 DIAGNOSIS — E785 Hyperlipidemia, unspecified: Secondary | ICD-10-CM | POA: Diagnosis not present

## 2022-10-29 DIAGNOSIS — I5082 Biventricular heart failure: Secondary | ICD-10-CM | POA: Diagnosis not present

## 2022-10-29 DIAGNOSIS — I5022 Chronic systolic (congestive) heart failure: Secondary | ICD-10-CM | POA: Diagnosis present

## 2022-10-29 DIAGNOSIS — I2489 Other forms of acute ischemic heart disease: Secondary | ICD-10-CM | POA: Diagnosis not present

## 2022-10-29 DIAGNOSIS — D509 Iron deficiency anemia, unspecified: Secondary | ICD-10-CM | POA: Diagnosis not present

## 2022-10-29 DIAGNOSIS — Z1152 Encounter for screening for COVID-19: Secondary | ICD-10-CM | POA: Diagnosis not present

## 2022-10-29 DIAGNOSIS — R570 Cardiogenic shock: Secondary | ICD-10-CM | POA: Diagnosis not present

## 2022-10-29 DIAGNOSIS — N1831 Chronic kidney disease, stage 3a: Secondary | ICD-10-CM | POA: Diagnosis present

## 2022-10-29 DIAGNOSIS — R000 Tachycardia, unspecified: Secondary | ICD-10-CM | POA: Diagnosis not present

## 2022-10-29 DIAGNOSIS — M797 Fibromyalgia: Secondary | ICD-10-CM | POA: Diagnosis not present

## 2022-10-29 DIAGNOSIS — J9811 Atelectasis: Secondary | ICD-10-CM | POA: Diagnosis not present

## 2022-10-29 DIAGNOSIS — Z79899 Other long term (current) drug therapy: Secondary | ICD-10-CM | POA: Diagnosis not present

## 2022-10-29 DIAGNOSIS — R0602 Shortness of breath: Secondary | ICD-10-CM | POA: Diagnosis not present

## 2022-10-29 DIAGNOSIS — Z7982 Long term (current) use of aspirin: Secondary | ICD-10-CM

## 2022-10-29 DIAGNOSIS — I130 Hypertensive heart and chronic kidney disease with heart failure and stage 1 through stage 4 chronic kidney disease, or unspecified chronic kidney disease: Principal | ICD-10-CM | POA: Diagnosis present

## 2022-10-29 DIAGNOSIS — N179 Acute kidney failure, unspecified: Secondary | ICD-10-CM | POA: Diagnosis present

## 2022-10-29 DIAGNOSIS — Z8249 Family history of ischemic heart disease and other diseases of the circulatory system: Secondary | ICD-10-CM

## 2022-10-29 DIAGNOSIS — R6510 Systemic inflammatory response syndrome (SIRS) of non-infectious origin without acute organ dysfunction: Secondary | ICD-10-CM | POA: Insufficient documentation

## 2022-10-29 DIAGNOSIS — E119 Type 2 diabetes mellitus without complications: Secondary | ICD-10-CM

## 2022-10-29 DIAGNOSIS — E1142 Type 2 diabetes mellitus with diabetic polyneuropathy: Secondary | ICD-10-CM | POA: Diagnosis not present

## 2022-10-29 DIAGNOSIS — F32A Depression, unspecified: Secondary | ICD-10-CM | POA: Diagnosis not present

## 2022-10-29 DIAGNOSIS — I5020 Unspecified systolic (congestive) heart failure: Secondary | ICD-10-CM | POA: Diagnosis not present

## 2022-10-29 DIAGNOSIS — Z882 Allergy status to sulfonamides status: Secondary | ICD-10-CM

## 2022-10-29 DIAGNOSIS — Z888 Allergy status to other drugs, medicaments and biological substances status: Secondary | ICD-10-CM

## 2022-10-29 DIAGNOSIS — I5A Non-ischemic myocardial injury (non-traumatic): Secondary | ICD-10-CM | POA: Diagnosis not present

## 2022-10-29 DIAGNOSIS — I3139 Other pericardial effusion (noninflammatory): Secondary | ICD-10-CM | POA: Diagnosis not present

## 2022-10-29 DIAGNOSIS — N183 Chronic kidney disease, stage 3 unspecified: Secondary | ICD-10-CM | POA: Insufficient documentation

## 2022-10-29 DIAGNOSIS — J189 Pneumonia, unspecified organism: Secondary | ICD-10-CM | POA: Diagnosis not present

## 2022-10-29 DIAGNOSIS — I700 Atherosclerosis of aorta: Secondary | ICD-10-CM | POA: Diagnosis not present

## 2022-10-29 DIAGNOSIS — Z880 Allergy status to penicillin: Secondary | ICD-10-CM

## 2022-10-29 DIAGNOSIS — Z885 Allergy status to narcotic agent status: Secondary | ICD-10-CM

## 2022-10-29 DIAGNOSIS — Z96641 Presence of right artificial hip joint: Secondary | ICD-10-CM | POA: Diagnosis present

## 2022-10-29 DIAGNOSIS — K219 Gastro-esophageal reflux disease without esophagitis: Secondary | ICD-10-CM | POA: Diagnosis present

## 2022-10-29 DIAGNOSIS — Z90710 Acquired absence of both cervix and uterus: Secondary | ICD-10-CM

## 2022-10-29 DIAGNOSIS — Z7984 Long term (current) use of oral hypoglycemic drugs: Secondary | ICD-10-CM

## 2022-10-29 DIAGNOSIS — Z809 Family history of malignant neoplasm, unspecified: Secondary | ICD-10-CM

## 2022-10-29 DIAGNOSIS — R059 Cough, unspecified: Secondary | ICD-10-CM | POA: Diagnosis not present

## 2022-10-29 DIAGNOSIS — I110 Hypertensive heart disease with heart failure: Secondary | ICD-10-CM | POA: Diagnosis not present

## 2022-10-29 LAB — BASIC METABOLIC PANEL
Anion gap: 12 (ref 5–15)
BUN: 29 mg/dL — ABNORMAL HIGH (ref 8–23)
CO2: 23 mmol/L (ref 22–32)
Calcium: 9.3 mg/dL (ref 8.9–10.3)
Chloride: 102 mmol/L (ref 98–111)
Creatinine, Ser: 1.03 mg/dL — ABNORMAL HIGH (ref 0.44–1.00)
GFR, Estimated: 56 mL/min — ABNORMAL LOW (ref >60)
Glucose, Bld: 197 mg/dL — ABNORMAL HIGH (ref 70–99)
Potassium: 4.2 mmol/L (ref 3.5–5.1)
Sodium: 137 mmol/L (ref 135–145)

## 2022-10-29 LAB — URINALYSIS, ROUTINE W REFLEX MICROSCOPIC
Bilirubin Urine: NEGATIVE
Glucose, UA: NEGATIVE mg/dL
Hgb urine dipstick: NEGATIVE
Ketones, ur: NEGATIVE mg/dL
Nitrite: NEGATIVE
Protein, ur: 30 mg/dL — AB
Specific Gravity, Urine: 1.012 (ref 1.005–1.030)
pH: 5 (ref 5.0–8.0)

## 2022-10-29 LAB — TROPONIN I (HIGH SENSITIVITY)
Troponin I (High Sensitivity): 23 ng/L — ABNORMAL HIGH (ref <18)
Troponin I (High Sensitivity): 28 ng/L — ABNORMAL HIGH (ref <18)

## 2022-10-29 LAB — CBC
HCT: 31.8 % — ABNORMAL LOW (ref 36.0–46.0)
Hemoglobin: 9.4 g/dL — ABNORMAL LOW (ref 12.0–15.0)
MCH: 22.2 pg — ABNORMAL LOW (ref 26.0–34.0)
MCHC: 29.6 g/dL — ABNORMAL LOW (ref 30.0–36.0)
MCV: 75 fL — ABNORMAL LOW (ref 80.0–100.0)
Platelets: 453 10*3/uL — ABNORMAL HIGH (ref 150–400)
RBC: 4.24 MIL/uL (ref 3.87–5.11)
RDW: 17.7 % — ABNORMAL HIGH (ref 11.5–15.5)
WBC: 11.1 10*3/uL — ABNORMAL HIGH (ref 4.0–10.5)
nRBC: 0 % (ref 0.0–0.2)

## 2022-10-29 LAB — BRAIN NATRIURETIC PEPTIDE: B Natriuretic Peptide: 1377.3 pg/mL — ABNORMAL HIGH (ref 0.0–100.0)

## 2022-10-29 MED ORDER — INSULIN ASPART 100 UNIT/ML IJ SOLN: 0.0000 [IU] | Freq: Every day | INTRAMUSCULAR | Status: DC

## 2022-10-29 MED ORDER — SODIUM CHLORIDE 0.9 % IV SOLN
1.0000 g | Freq: Once | INTRAVENOUS | Status: AC
  Administered 2022-10-29: 1 g via INTRAVENOUS
  Filled 2022-10-29: qty 10

## 2022-10-29 MED ORDER — SODIUM CHLORIDE 0.9 % IV SOLN: 1.0000 g | INTRAVENOUS | Status: DC

## 2022-10-29 MED ORDER — ACETAMINOPHEN 325 MG PO TABS
650.0000 mg | ORAL_TABLET | Freq: Four times a day (QID) | ORAL | Status: DC | PRN
  Administered 2022-10-30: 650 mg via ORAL
  Filled 2022-10-29: qty 2

## 2022-10-29 MED ORDER — FUROSEMIDE 10 MG/ML IJ SOLN
40.0000 mg | Freq: Once | INTRAMUSCULAR | Status: AC
  Administered 2022-10-29: 40 mg via INTRAVENOUS
  Filled 2022-10-29: qty 4

## 2022-10-29 MED ORDER — ACETAMINOPHEN 650 MG RE SUPP: 650.0000 mg | Freq: Four times a day (QID) | RECTAL | Status: DC | PRN

## 2022-10-29 MED ORDER — SODIUM CHLORIDE 0.9 % IV SOLN: 500.0000 mg | INTRAVENOUS | Status: DC

## 2022-10-29 MED ORDER — SODIUM CHLORIDE 0.9 % IV SOLN
500.0000 mg | Freq: Once | INTRAVENOUS | Status: AC
  Administered 2022-10-29: 500 mg via INTRAVENOUS
  Filled 2022-10-29: qty 5

## 2022-10-29 MED ORDER — INSULIN ASPART 100 UNIT/ML IJ SOLN
0.0000 [IU] | Freq: Three times a day (TID) | INTRAMUSCULAR | Status: DC
  Administered 2022-10-30 – 2022-10-31 (×3): 3 [IU] via SUBCUTANEOUS
  Administered 2022-10-31: 2 [IU] via SUBCUTANEOUS
  Administered 2022-11-01: 1 [IU] via SUBCUTANEOUS
  Administered 2022-11-01: 5 [IU] via SUBCUTANEOUS
  Administered 2022-11-01 – 2022-11-02 (×2): 2 [IU] via SUBCUTANEOUS
  Administered 2022-11-02: 3 [IU] via SUBCUTANEOUS
  Administered 2022-11-02 – 2022-11-03 (×2): 1 [IU] via SUBCUTANEOUS
  Administered 2022-11-03: 3 [IU] via SUBCUTANEOUS
  Administered 2022-11-03 – 2022-11-04 (×2): 2 [IU] via SUBCUTANEOUS
  Administered 2022-11-04: 1 [IU] via SUBCUTANEOUS
  Administered 2022-11-04: 3 [IU] via SUBCUTANEOUS
  Administered 2022-11-05: 1 [IU] via SUBCUTANEOUS
  Administered 2022-11-05: 7 [IU] via SUBCUTANEOUS
  Administered 2022-11-05: 2 [IU] via SUBCUTANEOUS
  Administered 2022-11-06: 3 [IU] via SUBCUTANEOUS
  Administered 2022-11-06: 2 [IU] via SUBCUTANEOUS

## 2022-10-29 NOTE — ED Provider Notes (Signed)
[facility] DEPART[facility] Provider Note   CSN: [id number] Arrival date & time: 10/29/22  1408     History  Chief Complaint  Patient presents with   Shortness of Breath   Weakness    Erica Cervantes is a 77 y.o. female.  The history is provided by the patient and medical records. No language interpreter was used.  Shortness of Breath Severity:  Moderate Onset quality:  Gradual Timing:  Constant Progression:  Unchanged Chronicity:  New Relieved by:  Nothing Worsened by:  Activity, coughing and exertion Ineffective treatments:  None tried Associated symptoms: chest pain and cough   Associated symptoms: no abdominal pain, no fever, no headaches, no neck pain, no rash, no sputum production, no vomiting and no wheezing   Weakness Associated symptoms: chest pain, cough and shortness of breath   Associated symptoms: no abdominal pain, no diarrhea, no dysuria, no fever, no headaches, no nausea, no seizures and no vomiting        Home Medications Prior to Admission medications   Medication Sig Start Date End Date Taking? Authorizing Provider  acetaminophen (TYLENOL) 500 MG tablet Take 500 mg by mouth every 6 (six) hours as needed for mild pain or moderate pain.    [provider]  amphetamine-dextroamphetamine (ADDERALL) 20 MG tablet Take 20-40 mg by mouth See admin instructions. Take 40 mg in the morning and 20 mg at 2 pm 10/26/17   [provider]  aspirin EC 325 MG tablet Take 1 tablet (325 mg total) by mouth 2 (two) times daily after a meal. Take x 1 month post op to decrease risk of blood clots. 02/06/22   Gary Fleet, PA-C  atorvastatin (LIPITOR) 10 MG tablet Take 10 mg by mouth every Wednesday.    [provider]  busPIRone (BUSPAR) 30 MG tablet Take 30 mg by mouth 3 (three) times daily. 08/23/17   [provider]  Calcium Carb-Cholecalciferol (CALCIUM 500+D PO) Take 1 tablet by mouth daily.    [provider]  cholecalciferol (VITAMIN D3) 25 MCG (1000 UNIT) tablet Take 2,000 Units by mouth daily.    [provider]  CHROMIUM-CINNAMON PO Take 1 tablet by mouth daily.    [provider]  Cranberry 200 MG CAPS Take 200 mg by mouth daily.    [provider]  cycloSPORINE (RESTASIS) 0.05 % ophthalmic emulsion Place 2 drops into both eyes 2 (two) times daily.    [provider]  docusate sodium (COLACE) 100 MG capsule Take 1 capsule (100 mg total) by mouth 2 (two) times daily. 02/06/22   Gary Fleet, PA-C  donepezil (ARICEPT) 5 MG tablet Take 5 mg by mouth at bedtime.    [provider]  DULoxetine (CYMBALTA) 60 MG capsule Take 120 mg by mouth daily at 12 noon. 09/25/17   [provider]  folic acid (FOLVITE) 956 MCG tablet Take 800-2,400 mcg by mouth daily.    [provider]  GLUCOSAMINE-CHONDROITIN PO Take 1 tablet by mouth daily.    [provider]  Guaifenesin (MUCINEX MAXIMUM STRENGTH) 1200 MG TB12 Take 1,200 mg by mouth 2 (two) times daily as needed (severe congestion).    [provider]  lisinopril-hydrochlorothiazide (ZESTORETIC) 10-12.5 MG tablet Take 1 tablet by mouth daily.    [provider]  metFORMIN (GLUCOPHAGE-XR) 500 MG 24 hr tablet Take 1,000 mg by mouth 2 (two) times daily. 09/11/17   [provider]  Multiple Minerals-Vitamins (CAL MAG ZINC +D3) TABS  Take 1 tablet by mouth daily.    [provider]  Multiple Vitamin (MULTIVITAMIN WITH MINERALS) TABS tablet Take 1 tablet by mouth daily.    [provider]  Multiple Vitamins-Minerals (HAIR/SKIN/NAILS/BIOTIN PO) Take 1 tablet by mouth daily.    [provider]  Multiple Vitamins-Minerals ([place]) CAPS Take 1 tablet by mouth daily.    [provider]  omeprazole (PRILOSEC) 20 MG capsule Take 20 mg by mouth daily.    [provider]  oxyCODONE-acetaminophen (PERCOCET/ROXICET)  5-325 MG tablet Take 1-2 tablets by mouth every 6 (six) hours as needed for severe pain. 02/06/22   Gary Fleet, PA-C  Probiotic Product (PROBIOTIC PO) Take 1 capsule by mouth daily.    [provider]  tiZANidine (ZANAFLEX) 2 MG tablet Take 1 tablet (2 mg total) by mouth every 8 (eight) hours as needed for muscle spasms. 02/06/22   Gary Fleet, PA-C  triamcinolone cream (KENALOG) 0.1 % Apply 1 application. topically 3 (three) times daily as needed for itching or rash. 12/29/21   [provider]      Allergies    Sulfa antibiotics, Ace inhibitors, Codeine, Penicillins, Statins, Tramadol, Zetia [ezetimibe], and Neosporin [neomycin-bacitracin zn-polymyx]    Review of Systems   Review of Systems  Constitutional:  Positive for chills and fatigue. Negative for fever.  HENT:  Negative for congestion.   Respiratory:  Positive for cough, chest tightness and shortness of breath. Negative for sputum production, wheezing and stridor.   Cardiovascular:  Positive for chest pain. Negative for palpitations and leg swelling.  Gastrointestinal:  Negative for abdominal pain, constipation, diarrhea, nausea and vomiting.  Genitourinary:  Negative for dysuria and flank pain.  Musculoskeletal:  Negative for back pain and neck pain.  Skin:  Negative for rash and wound.  Neurological:  Positive for weakness. Negative for seizures, light-headedness and headaches.  Psychiatric/Behavioral:  Negative for agitation.   All other systems reviewed and are negative.   Physical Exam Updated Vital Signs BP (!) 139/97   Pulse (!) 102   Temp 98 F (36.7 C) (Oral)   Resp 18   Wt 65.8 kg   SpO2 99%   BMI 25.29 kg/m  Physical Exam Vitals and nursing note reviewed.  Constitutional:      General: She is not in acute distress.    Appearance: She is well-developed. She is not ill-appearing, toxic-appearing or diaphoretic.  HENT:     Head: Normocephalic and atraumatic.     Mouth/Throat:     Mouth:  Mucous membranes are moist.  Eyes:     Conjunctiva/sclera: Conjunctivae normal.     Pupils: Pupils are equal, round, and reactive to light.  Cardiovascular:     Rate and Rhythm: Normal rate and regular rhythm.     Heart sounds: No murmur heard. Pulmonary:     Effort: Pulmonary effort is normal. No respiratory distress.     Breath sounds: Rhonchi and rales present.  Chest:     Chest wall: No tenderness.  Abdominal:     Palpations: Abdomen is soft.     Tenderness: There is no abdominal tenderness.  Musculoskeletal:        General: No swelling.     Cervical back: Neck supple.     Right lower leg: No tenderness. No edema.     Left lower leg: No tenderness. No edema.  Skin:    General: Skin is warm and dry.     Capillary Refill: Capillary refill takes less  than 2 seconds.     Findings: No erythema.  Neurological:     General: No focal deficit present.     Mental Status: She is alert.  Psychiatric:        Mood and Affect: Mood normal.     ED Results / Procedures / Treatments   Labs (all labs ordered are listed, but only abnormal results are displayed) Labs Reviewed  BASIC METABOLIC PANEL - Abnormal; Notable for the following components:      Result Value   Glucose, Bld 197 (*)    BUN 29 (*)    Creatinine, Ser 1.03 (*)    GFR, Estimated 56 (*)    All other components within normal limits  CBC - Abnormal; Notable for the following components:   WBC 11.1 (*)    Hemoglobin 9.4 (*)    HCT 31.8 (*)    MCV 75.0 (*)    MCH 22.2 (*)    MCHC 29.6 (*)    RDW 17.7 (*)    Platelets 453 (*)    All other components within normal limits  URINALYSIS, ROUTINE W REFLEX MICROSCOPIC - Abnormal; Notable for the following components:   Protein, ur 30 (*)    Leukocytes,Ua SMALL (*)    Bacteria, UA RARE (*)    All other components within normal limits  BRAIN NATRIURETIC PEPTIDE - Abnormal; Notable for the following components:   B Natriuretic Peptide 1,377.3 (*)    All other components  within normal limits  TROPONIN I (HIGH SENSITIVITY) - Abnormal; Notable for the following components:   Troponin I (High Sensitivity) 23 (*)    All other components within normal limits  TROPONIN I (HIGH SENSITIVITY) - Abnormal; Notable for the following components:   Troponin I (High Sensitivity) 28 (*)    All other components within normal limits  CULTURE, BLOOD (ROUTINE X 2)  CULTURE, BLOOD (ROUTINE X 2)  RESP PANEL BY RT-PCR (RSV, FLU A&B, COVID)  [id number]  OCCULT BLOOD X 1 CARD TO LAB, STOOL  BASIC METABOLIC PANEL  CBC  PROCALCITONIN  HEMOGLOBIN A1C  VITAMIN B12  FOLATE  IRON AND TIBC  FERRITIN  RETICULOCYTES  HEPATIC FUNCTION PANEL  CBG MONITORING, ED    EKG EKG Interpretation  Date/Time:  Thursday October 29 2022 14:17:51 EST Ventricular Rate:  109 PR Interval:  126 QRS Duration: 92 QT Interval:  332 QTC Calculation: 447 R Axis:   107 Text Interpretation: Sinus tachycardia Rightward axis Possible Anterior infarct , age undetermined Abnormal ECG When compared with ECG of 22-Dec-2019 17:37, PREVIOUS ECG IS PRESENT when comapred to prior, more t wave inversions. No STEMI Confirmed by Antony Blackbird [phone]) on 10/29/2022 6:55:04 PM  Radiology CT CHEST WO CONTRAST  Result Date: 10/29/2022 CLINICAL DATA:  Shortness of breath and weakness, increasing left basilar infiltrate on recent chest x-ray EXAM: CT CHEST WITHOUT CONTRAST TECHNIQUE: Multidetector CT imaging of the chest was performed following the standard protocol without IV contrast. RADIATION DOSE REDUCTION: This exam was performed according to the departmental dose-optimization program which includes automated exposure control, adjustment of the mA and/or kV according to patient size and/or use of iterative reconstruction technique. COMPARISON:  Chest x-ray from earlier the same day. FINDINGS: Cardiovascular: Somewhat limited due to lack of IV contrast. Atherosclerotic calcifications of the aorta are noted. No cardiac  enlargement is seen. Pulmonary artery is not significantly enlarged. Mediastinum/Nodes: Thoracic inlet is within normal limits. No hilar or mediastinal adenopathy is noted. The esophagus as visualized is within  normal limits. Lungs/Pleura: Large bilateral pleural effusions are noted with underlying lower lobe atelectatic changes. These have worsened in the interval from the prior exam. The remainder of the aerated lung appears within normal limits. Upper Abdomen: Visualized upper abdomen is within normal limits. Gallbladder has been surgically removed. Musculoskeletal: Degenerative changes of the thoracic spine are noted. No acute rib abnormality is noted. IMPRESSION: Large bilateral pleural effusions with underlying lower lobe atelectatic changes. This has worsened in the interval from the prior exam particularly on the right. Aortic Atherosclerosis (ICD10-I70.0). Electronically Signed   By: Inez Catalina M.D.   On: 10/29/2022 23:21   DG Chest 2 View  Result Date: 10/29/2022 CLINICAL DATA:  Shortness of breath with weakness and nonproductive cough. EXAM: CHEST - 2 VIEW COMPARISON:  10/20/2022 FINDINGS: Low volume film. The cardio pericardial silhouette is enlarged. Left base collapse/consolidation is mildly progressive in the interval and there is a small left pleural effusion. Likely tiny right pleural effusion. No findings to suggest pulmonary edema. The visualized bony structures of the thorax are unremarkable. IMPRESSION: Interval progression of left base collapse/consolidation with small bilateral pleural effusions. Electronically Signed   By: Misty Stanley M.D.   On: 10/29/2022 15:29    Procedures Procedures    CRITICAL CARE Performed by: Gwenyth Allegra Kiwan [place] Total critical care time: 35 minutes Critical care time was exclusive of separately billable procedures and treating other patients. Critical care was necessary to treat or prevent imminent or life-threatening deterioration. Critical care  was time spent personally by me on the following activities: development of treatment plan with patient and/or surrogate as well as nursing, discussions with consultants, evaluation of patient's response to treatment, examination of patient, obtaining history from patient or surrogate, ordering and performing treatments and interventions, ordering and review of laboratory studies, ordering and review of radiographic studies, pulse oximetry and re-evaluation of patient's condition.   Medications Ordered in ED Medications  azithromycin (ZITHROMAX) 500 mg in sodium chloride 0.9 % 250 mL IVPB (has no administration in time range)  cefTRIAXone (ROCEPHIN) 1 g in sodium chloride 0.9 % 100 mL IVPB (has no administration in time range)  acetaminophen (TYLENOL) tablet 650 mg (has no administration in time range)    Or  acetaminophen (TYLENOL) suppository 650 mg (has no administration in time range)  insulin aspart (novoLOG) injection 0-9 Units (has no administration in time range)  insulin aspart (novoLOG) injection 0-5 Units (has no administration in time range)  cefTRIAXone (ROCEPHIN) 1 g in sodium chloride 0.9 % 100 mL IVPB (0 g Intravenous Stopped 10/29/22 2336)  azithromycin (ZITHROMAX) 500 mg in sodium chloride 0.9 % 250 mL IVPB (0 mg Intravenous Stopped 10/29/22 2339)  furosemide (LASIX) injection 40 mg (40 mg Intravenous Given 10/29/22 2137)    ED Course/ Medical Decision Making/ A&P                             Medical Decision Making Amount and/or Complexity of Data Reviewed Labs: ordered.  Risk Decision regarding hospitalization.    Erica Cervantes is a 77 y.o. female with a past medical history significant for CKD, diabetes, and concern for recent diagnosis of heart failure yesterday presents with worsening shortness of breath fatigue and exertional chest pain.  Patient reports that she has had no cardiac symptoms until around Christmas when he started having some shortness of breath and  fatigue.  She reports that now she cannot lay flat at all.  She had a negative workup last week including viral testing but followed up and got an ultrasound yesterday that showed EF of 15%.  She reports she was given fluids the other week.  She says now she cannot get around, is having the exertional chest pressure, and fatigue.  She reports no recent deaths in the family and denies any significant alcohol use over the holidays.  No other viral syndromes around when her symptoms began to indicate a myocarditis.  On my exam, patient has rales.  Chest was nontender and abdomen was nontender.  She did not have significant edema in the legs however she reports she does not get fluid in the leg normally.  She was speaking clearly without any focal neurologic deficits and otherwise was well-appearing.  She was slightly tachycardic and tachypneic but she was afebrile and did not have hypoxia.  Patient did report some cough and some chills.  Patient's x-ray shows some concern for not only fluid but also possible pneumonia.  Given her leukocytosis, fatigue, chills, and imaging findings, will treat for possible [facility] pneumonia.  BNP elevated and clinically I am concerned about her going home with this worsening fluid overload and heart failure.  Will call cardiology and anticipate admission.  9:04 PM Spoke to cardiology who agrees with medicine admission for new heart failure with EF of 15% yesterday with worsening shortness of breath and agrees with treatment with antibiotics for possible pneumonia making symptoms worse.  They will see in consultation shortly.        Final Clinical Impression(s) / ED Diagnoses Final diagnoses:  Shortness of breath  Heart failure, unspecified HF chronicity, unspecified heart failure type (HCC)  Hypervolemia, unspecified hypervolemia type  Community acquired pneumonia, unspecified laterality     Clinical Impression: 1. Shortness of breath   2. Heart  failure, unspecified HF chronicity, unspecified heart failure type ([place])   3. Hypervolemia, unspecified hypervolemia type   4. Community acquired pneumonia, unspecified laterality     Disposition: Admit  This note was prepared with assistance of Systems analyst. Occasional wrong-word or sound-a-like substitutions may have occurred due to the inherent limitations of voice recognition software.      Zaylie Gisler, Gwenyth Allegra, MD 10/30/22 0002

## 2022-10-29 NOTE — Consult Note (Signed)
Cardiology Consult   Patient ID: GALEN MALKOWSKI MRN: [id number]; DOB: [birth year]   Admission date: 10/29/2022  PCP:  Caryl Bis, MD   [place] Providers Cardiologist:  None        Chief Complaint:  Shortness of Breath   History of Present Illness:   Ms. Koppel is a 77 y.o. female with DMII, hypertension, a history of CKD, newly diagnosed heart failure with reduced ejection fraction (15% on outside echo on 10/28/22) who presents to the ED for evaluation of several weeks of progressive shortness of breath with associated cough and weakness.   Patient recounts feeling poorly since 12/25 with symptoms of extreme fatigue, weakness, and shortness of breath with even simple movements like turning in bed. She also endorses several days of diarrhea around that time which has fortunately resolved and a nagging cough that has remained.  She does not believe she has gained any weight, but has noticed some mild swelling in her lower extremities as well as in her abdomen.  She also describes symptoms akin to orthopnea. She denies any  lightheadedness/dizziness, exertional chest pressure/discomfort irregular heart beat/palpitations, or presyncope/syncope. Aside from her cough and some subjective chills, there are no clear localizing signs or symptoms of acute infection.  She reports that her shortness of breath is only gotten worse since then prompting her to come to the emergency room for evaluation. She was seen at [facility] one day prior to her presentation where a TTE was performed and showed a depressed EF of 15%. I am not clear what was done after this finding.   The patient now presents to [facility] ED. EKG shows sinus tachycardia with LPFB and late precordial transition, but otherwise no dynamic ST changes. CXR shows interval progression of left base collapse/consolidation with small bilateral pleural effusions.  Labs showing WBC 11.1, hemoglobin 9.4, MCV 75.0, platelet count 453k,  glucose 197, BUN 29, creatinine 1.0, BNP 1377, and troponin trend of 23 -> 28.    Past Medical History:  Diagnosis Date   Chronic kidney disease    stage 4   Depression    Diabetes mellitus without complication (HCC)    GERD (gastroesophageal reflux disease)    Hypertension    Neuromuscular disorder ([place])    peripheral neuropathy    Past Surgical History:  Procedure Laterality Date   ABDOMINAL HYSTERECTOMY  49   CHOLECYSTECTOMY     age 74   DILATION AND CURETTAGE OF UTERUS     x 4  1980   TONSILLECTOMY     age 23   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   Procedure: TOTAL HIP ARTHROPLASTY ANTERIOR APPROACH;  Surgeon: Dorna Leitz, MD;  Location: [facility];  Service: Orthopedics;  Laterality: Right;     Medications Prior to Admission: Prior to Admission medications   Medication Sig Start Date End Date Taking? Authorizing Provider  acetaminophen (TYLENOL) 500 MG tablet Take 500 mg by mouth every 6 (six) hours as needed for mild pain or moderate pain.    [provider]  amphetamine-dextroamphetamine (ADDERALL) 20 MG tablet Take 20-40 mg by mouth See admin instructions. Take 40 mg in the morning and 20 mg at 2 pm 10/26/17   [provider]  aspirin EC 325 MG tablet Take 1 tablet (325 mg total) by mouth 2 (two) times daily after a meal. Take x 1 month post op to decrease risk of blood clots. 02/06/22   Gary Fleet, PA-C  atorvastatin (LIPITOR) 10 MG  tablet Take 10 mg by mouth every Wednesday.    [provider]  busPIRone (BUSPAR) 30 MG tablet Take 30 mg by mouth 3 (three) times daily. 08/23/17   [provider]  Calcium Carb-Cholecalciferol (CALCIUM 500+D PO) Take 1 tablet by mouth daily.    [provider]  cholecalciferol (VITAMIN D3) 25 MCG (1000 UNIT) tablet Take 2,000 Units by mouth daily.    [provider]  CHROMIUM-CINNAMON PO Take 1 tablet by mouth daily.    [provider]  Cranberry 200 MG CAPS Take 200 mg by  mouth daily.    [provider]  cycloSPORINE (RESTASIS) 0.05 % ophthalmic emulsion Place 2 drops into both eyes 2 (two) times daily.    [provider]  docusate sodium (COLACE) 100 MG capsule Take 1 capsule (100 mg total) by mouth 2 (two) times daily. 02/06/22   Gary Fleet, PA-C  donepezil (ARICEPT) 5 MG tablet Take 5 mg by mouth at bedtime.    [provider]  DULoxetine (CYMBALTA) 60 MG capsule Take 120 mg by mouth daily at 12 noon. 09/25/17   [provider]  folic acid (FOLVITE) 093 MCG tablet Take 800-2,400 mcg by mouth daily.    [provider]  GLUCOSAMINE-CHONDROITIN PO Take 1 tablet by mouth daily.    [provider]  Guaifenesin (MUCINEX MAXIMUM STRENGTH) 1200 MG TB12 Take 1,200 mg by mouth 2 (two) times daily as needed (severe congestion).    [provider]  lisinopril-hydrochlorothiazide (ZESTORETIC) 10-12.5 MG tablet Take 1 tablet by mouth daily.    [provider]  metFORMIN (GLUCOPHAGE-XR) 500 MG 24 hr tablet Take 1,000 mg by mouth 2 (two) times daily. 09/11/17   [provider]  Multiple Minerals-Vitamins (CAL MAG ZINC +D3) TABS Take 1 tablet by mouth daily.    [provider]  Multiple Vitamin (MULTIVITAMIN WITH MINERALS) TABS tablet Take 1 tablet by mouth daily.    [provider]  Multiple Vitamins-Minerals (HAIR/SKIN/NAILS/BIOTIN PO) Take 1 tablet by mouth daily.    [provider]  Multiple Vitamins-Minerals (Burns) CAPS Take 1 tablet by mouth daily.    [provider]  omeprazole (PRILOSEC) 20 MG capsule Take 20 mg by mouth daily.    [provider]  oxyCODONE-acetaminophen (PERCOCET/ROXICET) 5-325 MG tablet Take 1-2 tablets by mouth every 6 (six) hours as needed for severe pain. 02/06/22   Gary Fleet, PA-C  Probiotic Product (PROBIOTIC PO) Take 1 capsule by mouth daily.    [provider]  tiZANidine (ZANAFLEX) 2 MG  tablet Take 1 tablet (2 mg total) by mouth every 8 (eight) hours as needed for muscle spasms. 02/06/22   Gary Fleet, PA-C  triamcinolone cream (KENALOG) 0.1 % Apply 1 application. topically 3 (three) times daily as needed for itching or rash. 12/29/21   [provider]     Allergies:    Allergies  Allergen Reactions   Sulfa Antibiotics Shortness Of Breath   Ace Inhibitors Cough    Pt tolerates lisinopril    Codeine Itching and Swelling   Penicillins     Unknown reaction     Statins     Body aches    Tramadol     Numbness    Zetia [Ezetimibe]     Myalgia    Neosporin [Neomycin-Bacitracin Zn-Polymyx] Rash    Social History:   Social History   Socioeconomic History   Marital status: Single    Spouse name: Not on file  Number of children: Not on file   Years of education: Not on file   Highest education level: Not on file  Occupational History   Not on file  Tobacco Use   Smoking status: Never   Smokeless tobacco: Never  Vaping Use   Vaping Use: Never used  Substance and Sexual Activity   Alcohol use: No   Drug use: No   Sexual activity: Not on file  Other Topics Concern   Not on file  Social History Narrative   Not on file   Social Determinants of Health   Financial Resource Strain: Not on file  Food Insecurity: Not on file  Transportation Needs: Not on file  Physical Activity: Not on file  Stress: Not on file  Social Connections: Not on file  Intimate Partner Violence: Not on file    Family History:   The patient's family history includes Cancer in her father, mother, and another family member; Heart attack in her brother; Polycystic kidney disease in her brother.    ROS:  Please see the history of present illness.  All other ROS reviewed and negative.     Physical Exam/Data:   Vitals:   10/29/22 2000 10/29/22 2015 10/29/22 2030 10/29/22 2125  BP: (!) 122/91 137/85 128/81 (!) 131/92  Pulse: (!) 105 (!) 101 (!) 108 (!) 110  Resp: 20 (!)  32 (!) 21 18  Temp:      TempSrc:      SpO2: 96% 95% 98% 98%  Weight:       No intake or output data in the 24 hours ending 10/29/22 2145    10/29/2022    2:17 PM 03/04/2022    9:14 AM 02/06/2022    8:30 AM  Last 3 Weights  Weight (lbs) 145 lb 1 oz 145 lb 150 lb  Weight (kg) 65.8 kg 65.772 kg 68.04 kg     Body mass index is 25.29 kg/m.  General:  Well nourished, well developed, in no acute distress HEENT: normal Neck: elevated JVD to at least the angle of the mandible Cardiac:  normal S1, S2; tachy regular rhythm; no murmur, rubs, or gallops appreciated Lungs:  Bilateral basal rales with the diminished breath sounds (L > R) Abd: soft, nontender, mil distension Ext: trace edema Musculoskeletal:  No deformities, BUE and BLE strength normal and equal Skin: warm and dry, erythema overlying faces with numerous picking wounds in various stages of healing Neuro: Grossly intact Psych:  Normal mood and affect    EKG:  The ECG that was done 10/29/22 was personally reviewed and demonstrates sinus tach with LPFB and no dynamic ST changes.  Relevant CV Studies: TTE (10/28/22; performed at [facility])  Summary  1. The left ventricle is moderately to severely dilated in size with normal wall thickness.  2. The left ventricular systolic function is severely decreased, LVEF is visually estimated at 15%.  3. There is mild to moderate centrally directed mitral regurgitation.  4. The left atrium is mildly dilated in size.  5. The right ventricle is normal in size, with normal systolic function.  6. There is moderate pulmonary hypertension.  7. IVC size and inspiratory change suggest mildly elevated right atrial pressure. (5-10 mmHg).    Laboratory Data:  High Sensitivity Troponin:   Recent Labs  Lab 10/29/22 1419 10/29/22 1651  TROPONINIHS 23* 28*      Chemistry Recent Labs  Lab 10/29/22 1419  NA 137  K 4.2  CL 102  CO2 23  GLUCOSE 197*  BUN 29*  CREATININE 1.03*   CALCIUM 9.3  GFRNONAA 56*  ANIONGAP 12    No results for input(s): "PROT", "ALBUMIN", "AST", "ALT", "ALKPHOS", "BILITOT" in the last 168 hours. Lipids No results for input(s): "CHOL", "TRIG", "HDL", "LABVLDL", "LDLCALC", "CHOLHDL" in the last 168 hours. Hematology Recent Labs  Lab 10/29/22 1419  WBC 11.1*  RBC 4.24  HGB 9.4*  HCT 31.8*  MCV 75.0*  MCH 22.2*  MCHC 29.6*  RDW 17.7*  PLT 453*   Thyroid No results for input(s): "TSH", "FREET4" in the last 168 hours. BNP Recent Labs  Lab 10/29/22 1419  BNP 1,377.3*    DDimer No results for input(s): "DDIMER" in the last 168 hours.   Radiology/Studies:  DG Chest 2 View  Result Date: 10/29/2022 CLINICAL DATA:  Shortness of breath with weakness and nonproductive cough. EXAM: CHEST - 2 VIEW COMPARISON:  10/20/2022 FINDINGS: Low volume film. The cardio pericardial silhouette is enlarged. Left base collapse/consolidation is mildly progressive in the interval and there is a small left pleural effusion. Likely tiny right pleural effusion. No findings to suggest pulmonary edema. The visualized bony structures of the thorax are unremarkable. IMPRESSION: Interval progression of left base collapse/consolidation with small bilateral pleural effusions. Electronically Signed   By: Misty Stanley M.D.   On: 10/29/2022 15:29     Assessment and Plan:   Ms. Brandhorst is a 77 y.o. female with DMII, hypertension, a history of CKD, newly diagnosed heart failure with reduced ejection fraction (15% on outside echo on 10/28/21) who presents to the ED for evaluation of several weeks of progressive shortness of breath with associated cough and weakness.    Overall presentation is suspicious for an acute CHF exacerbation. Pertinent findings include an elevated BNP, x-ray showing bilateral pleural effusions, elevated JVD, and sinus tachycardia. She does endorse a nagging cough that in the setting of her CXR findings could represent possible pneumonia, but I think  this is less likely, however I see no fault with empiric coverage for now. Her sinus tachycardia could represent compensatory changes in response to her heart failure, but I think evaluating for systemic infection with blood cultures given her diarrhea and her documented cellulitic changes of her face and scalp is not a terrible idea. Could also consider obtained CT PE study given her rather sedentary activity level over the past several weeks. For now she is hemodynamically stable, but will continue to monitor closely in this early period.  # Shorntess of breath # Weakness # Newly diagnosed HRrEF (EF 15%) # Sinus Tachycardia - Diurese with Lasix '40mg'$  IV once - Monitor strict intake and output  - Daily weights  - Keep NPO at midnight for consideration of ischemic evaluation in the setting of new HF diagnosis - Hold off on initiating GDMT in the acute setting   For questions or updates, please contact [place] Please consult [email or web address] for contact info under     Signed, Clois Dupes, MD  10/29/2022 9:45 PM

## 2022-10-29 NOTE — ED Provider Triage Note (Signed)
Emergency Medicine Provider Triage Evaluation Note  Erica Cervantes , a 77 y.o. female  was evaluated in triage.  Pt complains of generalized weakness, fatigue, shortness of breath since Christmas.  Patient ports that she has been seen by other hospital services.  Reports that she still not feeling any better.  No real chest pain but mentions that she is having trouble sleeping because she cannot breathe and never she lays down.  No fevers.  Review of Systems  Positive:  Negative:   Physical Exam  BP 137/74 (BP Location: Right Arm)   Pulse (!) 107   Temp 98.2 F (36.8 C) (Oral)   Resp 18   Wt 65.8 kg   SpO2 99%   BMI 25.29 kg/m  Gen:   Awake, no distress   Resp:  Normal effort  MSK:   Moves extremities without difficulty  Other:  Diminished breath sounds.  Patient still speaking in full sentences.  Medical Decision Making  Medically screening exam initiated at 2:43 PM.  Appropriate orders placed.  Erica Cervantes was informed that the remainder of the evaluation will be completed by another provider, this initial triage assessment does not replace that evaluation, and the importance of remaining in the ED until their evaluation is complete.  An echocardiogram yesterday that showed an EF of 15%.  She does not appear to be fluid overloaded.  Labs ordered.  Vital signs show slight tachycardia otherwise no respiratory distress and satting well on room air.   Sherrell Puller, [place] 10/29/22 1448

## 2022-10-29 NOTE — ED Notes (Signed)
ED TO INPATIENT HANDOFF REPORT  ED Nurse Name and Phone #: Colletta [place] [phone]  S Name/Age/Gender Erica Cervantes 77 y.o. female Room/Bed: 024C/024C  Code Status   Code Status: DNR  Home/SNF/Other Home Patient oriented to: self, place, time Is this baseline? Yes   Triage Complete: Triage complete  Chief Complaint Acute on chronic HFrEF (heart failure with reduced ejection fraction) ([place]) [I50.23]  Triage Note SOB, weakness and non productive cough since 10/05/22. Denies n/v/d. Pt was seen at [facility] earlier this week.    Allergies Allergies  Allergen Reactions   Sulfa Antibiotics Shortness Of Breath   Ace Inhibitors Cough    Pt tolerates lisinopril    Codeine Itching and Swelling   Penicillins     Unknown reaction     Statins     Body aches    Tramadol     Numbness    Zetia [Ezetimibe]     Myalgia    Neosporin [Neomycin-Bacitracin Zn-Polymyx] Rash    Level of Care/Admitting Diagnosis ED Disposition     ED Disposition  Admit   Condition  --   Comment  Hospital Area: [facility] [[id number]]  Level of Care: Telemetry Cardiac [[id number]]  May admit patient to [facility] or [facility] if equivalent level of care is available:: Yes  Covid Evaluation: Asymptomatic - no recent exposure (last 10 days) testing not required  Diagnosis: Acute on chronic HFrEF (heart failure with reduced ejection fraction) [facility]) [[id number]]  Admitting Physician: Shela Leff [[id number]]  Attending Physician: Shela Leff [[id number]]  Certification:: I certify this patient will need inpatient services for at least 2 midnights  Estimated Length of Stay: 2          B Medical/Surgery History Past Medical History:  Diagnosis Date   Chronic kidney disease    stage 4   Depression    Diabetes mellitus without complication ([place])    GERD (gastroesophageal reflux disease)    Hypertension    Neuromuscular disorder ([place])    peripheral neuropathy   Past Surgical  History:  Procedure Laterality Date   ABDOMINAL HYSTERECTOMY  1990   CHOLECYSTECTOMY     age 52   DILATION AND CURETTAGE OF UTERUS     x 4  1980   TONSILLECTOMY     age 73   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   Procedure: TOTAL HIP ARTHROPLASTY ANTERIOR APPROACH;  Surgeon: Dorna Leitz, MD;  Location: [facility];  Service: Orthopedics;  Laterality: Right;     A IV Location/Drains/Wounds Patient Lines/Drains/Airways Status     Active Line/Drains/Airways     Name Placement date Placement time Site Days   Peripheral IV 10/29/22 18 G Right Antecubital 10/29/22  2136  Antecubital  less than 1   Peripheral IV 10/29/22 20 G Left Antecubital 10/29/22  2145  Antecubital  less than 1   Incision (Closed) 02/06/22 Hip Right 02/06/22  1055  -- 265            Intake/Output Last 24 hours No intake or output data in the 24 hours ending 10/29/22 2313  Labs/Imaging Results for orders placed or performed during the hospital encounter of 10/29/22 (from the past 48 hour(s))  Basic metabolic panel     Status: Abnormal   Collection Time: 10/29/22  2:19 PM  Result Value Ref Range   Sodium 137 135 - 145 mmol/L   Potassium 4.2 3.5 - 5.1 mmol/L   Chloride 102 98 - 111 mmol/L   CO2 23  22 - 32 mmol/L   Glucose, Bld 197 (H) 70 - 99 mg/dL    Comment: Glucose reference range applies only to samples taken after fasting for at least 8 hours.   BUN 29 (H) 8 - 23 mg/dL   Creatinine, Ser 1.03 (H) 0.44 - 1.00 mg/dL   Calcium 9.3 8.9 - 10.3 mg/dL   GFR, Estimated 56 (L) >60 mL/min    Comment: (NOTE) Calculated using the CKD-EPI Creatinine Equation (2021)    Anion gap 12 5 - 15    Comment: Performed at [facility] [address], [place], [place] [address]  CBC     Status: Abnormal   Collection Time: 10/29/22  2:19 PM  Result Value Ref Range   WBC 11.1 (H) 4.0 - 10.5 K/uL   RBC 4.24 3.87 - 5.11 MIL/uL   Hemoglobin 9.4 (L) 12.0 - 15.0 g/dL   HCT 31.8 (L) 36.0 - 46.0 %   MCV 75.0 (L) 80.0 -  100.0 fL   MCH 22.2 (L) 26.0 - 34.0 pg   MCHC 29.6 (L) 30.0 - 36.0 g/dL   RDW 17.7 (H) 11.5 - 15.5 %   Platelets 453 (H) 150 - 400 K/uL   nRBC 0.0 0.0 - 0.2 %    Comment: Performed at [facility], [place] [address] [place], [place] [address]  Brain natriuretic peptide     Status: Abnormal   Collection Time: 10/29/22  2:19 PM  Result Value Ref Range   B Natriuretic Peptide 1,377.3 (H) 0.0 - 100.0 pg/mL    Comment: Performed at [facility] [address], [place], [place] [address]  Troponin I (High Sensitivity)     Status: Abnormal   Collection Time: 10/29/22  2:19 PM  Result Value Ref Range   Troponin I (High Sensitivity) 23 (H) <18 ng/L    Comment: (NOTE) Elevated high sensitivity troponin I (hsTnI) values and significant  changes across serial measurements may suggest ACS but many other  chronic and acute conditions are known to elevate hsTnI results.  Refer to the "Links" section for chest pain algorithms and additional  guidance. Performed at [facility], [place] [address], [place], [place] [address]   Troponin I (High Sensitivity)     Status: Abnormal   Collection Time: 10/29/22  4:51 PM  Result Value Ref Range   Troponin I (High Sensitivity) 28 (H) <18 ng/L    Comment: (NOTE) Elevated high sensitivity troponin I (hsTnI) values and significant  changes across serial measurements may suggest ACS but many other  chronic and acute conditions are known to elevate hsTnI results.  Refer to the "Links" section for chest pain algorithms and additional  guidance. Performed at [facility], [place] [address], [place], [place] [address]   Urinalysis, Routine w reflex microscopic Urine, Clean Catch     Status: Abnormal   Collection Time: 10/29/22 10:30 PM  Result Value Ref Range   Color, Urine YELLOW YELLOW   APPearance CLEAR CLEAR   Specific Gravity, Urine 1.012 1.005 - 1.030   pH 5.0 5.0 - 8.0   Glucose, UA NEGATIVE NEGATIVE mg/dL   Hgb urine dipstick  NEGATIVE NEGATIVE   Bilirubin Urine NEGATIVE NEGATIVE   Ketones, ur NEGATIVE NEGATIVE mg/dL   Protein, ur 30 (A) NEGATIVE mg/dL   Nitrite NEGATIVE NEGATIVE   Leukocytes,Ua SMALL (A) NEGATIVE   RBC / HPF 0-5 0 - 5 RBC/hpf   WBC, UA 11-20 0 - 5 WBC/hpf  Bacteria, UA RARE (A) NONE SEEN   Squamous Epithelial / HPF 0-5 0 - 5 /HPF   Budding Yeast PRESENT     Comment: Performed at [facility], [place] [address], [place], [place] [address]   DG Chest 2 View  Result Date: 10/29/2022 CLINICAL DATA:  Shortness of breath with weakness and nonproductive cough. EXAM: CHEST - 2 VIEW COMPARISON:  10/20/2022 FINDINGS: Low volume film. The cardio pericardial silhouette is enlarged. Left base collapse/consolidation is mildly progressive in the interval and there is a small left pleural effusion. Likely tiny right pleural effusion. No findings to suggest pulmonary edema. The visualized bony structures of the thorax are unremarkable. IMPRESSION: Interval progression of left base collapse/consolidation with small bilateral pleural effusions. Electronically Signed   By: Misty Stanley M.D.   On: 10/29/2022 15:29    Pending Labs Unresulted Labs (From admission, onward)     Start     Ordered   10/30/22 3790  Basic metabolic panel  Tomorrow morning,   R        10/29/22 2232   10/30/22 0500  CBC  Tomorrow morning,   R        10/29/22 2232   10/30/22 0500  Procalcitonin - Baseline  Tomorrow morning,   R        10/29/22 2232   10/30/22 0500  Vitamin B12  (Anemia Panel ([facility]))  Tomorrow morning,   R        10/29/22 2243   10/30/22 0500  Folate  (Anemia Panel ([facility]))  Tomorrow morning,   R        10/29/22 2243   10/30/22 0500  Iron and TIBC  (Anemia Panel ([facility]))  Tomorrow morning,   R        10/29/22 2243   10/30/22 0500  Ferritin  (Anemia Panel ([facility]))  Tomorrow morning,   R        10/29/22 2243   10/30/22 0500  Reticulocytes  (Anemia Panel ([facility]))  Tomorrow morning,   R        10/29/22 2243   10/29/22 2300   Hepatic function panel  Once,   R        10/29/22 2300   10/29/22 2231  Hemoglobin A1c  Once,   R       Comments: To assess prior glycemic control    10/29/22 2232   10/29/22 2230  Occult blood card to lab, stool  Once,   R        10/29/22 2232   10/29/22 2230  Resp panel by RT-PCR (RSV, Flu A&B, Covid) Anterior Nasal Swab  (Tier 2 - SymptomaticResp panel by RT-PCR (RSV, Flu A&B, Covid))  Once,   R        10/29/22 2232   10/29/22 2130  Culture, blood (Routine X 2) w Reflex to ID Panel  BLOOD CULTURE X 2,   R      10/29/22 2129            Vitals/Pain Today's Vitals   10/29/22 2000 10/29/22 2015 10/29/22 2030 10/29/22 2125  BP: (!) 122/91 137/85 128/81 (!) 131/92  Pulse: (!) 105 (!) 101 (!) 108 (!) 110  Resp: 20 (!) 32 (!) 21 18  Temp:      TempSrc:      SpO2: 96% 95% 98% 98%  Weight:      PainSc:        Isolation Precautions Airborne and Contact precautions  Medications Medications  azithromycin (  ZITHROMAX) 500 mg in sodium chloride 0.9 % 250 mL IVPB (500 mg Intravenous New Bag/Given 10/29/22 2229)  azithromycin (ZITHROMAX) 500 mg in sodium chloride 0.9 % 250 mL IVPB (has no administration in time range)  cefTRIAXone (ROCEPHIN) 1 g in sodium chloride 0.9 % 100 mL IVPB (has no administration in time range)  acetaminophen (TYLENOL) tablet 650 mg (has no administration in time range)    Or  acetaminophen (TYLENOL) suppository 650 mg (has no administration in time range)  insulin aspart (novoLOG) injection 0-9 Units (has no administration in time range)  insulin aspart (novoLOG) injection 0-5 Units (has no administration in time range)  cefTRIAXone (ROCEPHIN) 1 g in sodium chloride 0.9 % 100 mL IVPB (1 g Intravenous New Bag/Given 10/29/22 2146)  furosemide (LASIX) injection 40 mg (40 mg Intravenous Given 10/29/22 2137)    Mobility walks with person assist     Focused Assessments Pulmonary Assessment Handoff:  Lung sounds:   O2 Device: Room Air       R Recommendations: See Admitting Provider Note  Report given to:   Additional Notes: Pt on [place]

## 2022-10-29 NOTE — H&P (Signed)
History and Physical    Erica Cervantes CWC:[id number] DOB: 06/29/1946 DOA: 10/29/2022  PCP: Caryl Bis, MD  Patient coming from: Home  Chief Complaint: Shortness of breath  HPI: Erica Cervantes is a 77 y.o. female with medical history significant of dementia, CKD stage IIIa, type 2 diabetes, hypertension, hyperlipidemia, depression, GERD.  Seen by cardiology yesterday at [facility] health care for shortness of breath and had an echocardiogram done which revealed severely decreased EF of 15%, mild to moderate MR.  Patient presents to the ED complaining of shortness of breath, cough, and generalized weakness.  EKG showing sinus tachycardia and mild T wave inversions inferolaterally.  Chest x-ray showing interval progression of left base collapse/consolidation with small bilateral pleural effusions; no pulmonary edema.  Patient was not hypoxic in the ED.  Labs showing WBC 11.1, hemoglobin 9.4, MCV 75.0, platelet count 453k, glucose 197, BUN 29, creatinine 1.0 (at baseline), BNP 1377, troponin 23> 28, UA pending.  Patient received IV Lasix 40 mg, ceftriaxone, and azithromycin.  Cardiology consulted and [facility] called to admit.  Patient reports progressively worsening dyspnea since Christmas and also having dry cough.  Endorsing orthopnea and bilateral lower extremity edema.  She is endorsing intermittent substernal chest tightness which has been ongoing since November.  Denies any chest pain or tightness at present.  Denies fevers.  Denies history of CHF and has never had an echocardiogram done until yesterday.  Patient received Lasix in the ED and has not urinated since then.  She is endorsing suprapubic pain/pressure.  States she stays in bed most of the time and keeps picking at the skin of her face and scalp.  Reports being diagnosed with cellulitis and has been chronically on doxycycline.  Review of Systems:  Review of Systems  All other systems reviewed and are negative.   Past Medical History:  Diagnosis  Date   Chronic kidney disease    stage 4   Depression    Diabetes mellitus without complication (HCC)    GERD (gastroesophageal reflux disease)    Hypertension    Neuromuscular disorder ([place])    peripheral neuropathy    Past Surgical History:  Procedure Laterality Date   ABDOMINAL HYSTERECTOMY  40   CHOLECYSTECTOMY     age 57   DILATION AND CURETTAGE OF UTERUS     x 4  1980   TONSILLECTOMY     age 6   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   Procedure: TOTAL HIP ARTHROPLASTY ANTERIOR APPROACH;  Surgeon: Dorna Leitz, MD;  Location: [facility];  Service: Orthopedics;  Laterality: Right;     reports that she has never smoked. She has never used smokeless tobacco. She reports that she does not drink alcohol and does not use drugs.  Allergies  Allergen Reactions   Sulfa Antibiotics Shortness Of Breath   Ace Inhibitors Cough    Pt tolerates lisinopril    Codeine Itching and Swelling   Penicillins     Unknown reaction     Statins     Body aches    Tramadol     Numbness    Zetia [Ezetimibe]     Myalgia    Neosporin [Neomycin-Bacitracin Zn-Polymyx] Rash    Family History  Problem Relation Age of Onset   Cancer Mother    Cancer Father    Heart attack Brother    Polycystic kidney disease Brother    Cancer Other     Prior to Admission medications   Medication Sig Start Date  End Date Taking? Authorizing Provider  acetaminophen (TYLENOL) 500 MG tablet Take 500 mg by mouth every 6 (six) hours as needed for mild pain or moderate pain.    [provider]  amphetamine-dextroamphetamine (ADDERALL) 20 MG tablet Take 20-40 mg by mouth See admin instructions. Take 40 mg in the morning and 20 mg at 2 pm 10/26/17   [provider]  aspirin EC 325 MG tablet Take 1 tablet (325 mg total) by mouth 2 (two) times daily after a meal. Take x 1 month post op to decrease risk of blood clots. 02/06/22   Gary Fleet, PA-C  atorvastatin (LIPITOR) 10 MG tablet Take 10 mg by  mouth every Wednesday.    [provider]  busPIRone (BUSPAR) 30 MG tablet Take 30 mg by mouth 3 (three) times daily. 08/23/17   [provider]  Calcium Carb-Cholecalciferol (CALCIUM 500+D PO) Take 1 tablet by mouth daily.    [provider]  cholecalciferol (VITAMIN D3) 25 MCG (1000 UNIT) tablet Take 2,000 Units by mouth daily.    [provider]  CHROMIUM-CINNAMON PO Take 1 tablet by mouth daily.    [provider]  Cranberry 200 MG CAPS Take 200 mg by mouth daily.    [provider]  cycloSPORINE (RESTASIS) 0.05 % ophthalmic emulsion Place 2 drops into both eyes 2 (two) times daily.    [provider]  docusate sodium (COLACE) 100 MG capsule Take 1 capsule (100 mg total) by mouth 2 (two) times daily. 02/06/22   Gary Fleet, PA-C  donepezil (ARICEPT) 5 MG tablet Take 5 mg by mouth at bedtime.    [provider]  DULoxetine (CYMBALTA) 60 MG capsule Take 120 mg by mouth daily at 12 noon. 09/25/17   [provider]  folic acid (FOLVITE) 644 MCG tablet Take 800-2,400 mcg by mouth daily.    [provider]  GLUCOSAMINE-CHONDROITIN PO Take 1 tablet by mouth daily.    [provider]  Guaifenesin (MUCINEX MAXIMUM STRENGTH) 1200 MG TB12 Take 1,200 mg by mouth 2 (two) times daily as needed (severe congestion).    [provider]  lisinopril-hydrochlorothiazide (ZESTORETIC) 10-12.5 MG tablet Take 1 tablet by mouth daily.    [provider]  metFORMIN (GLUCOPHAGE-XR) 500 MG 24 hr tablet Take 1,000 mg by mouth 2 (two) times daily. 09/11/17   [provider]  Multiple Minerals-Vitamins (CAL MAG ZINC +D3) TABS Take 1 tablet by mouth daily.    [provider]  Multiple Vitamin (MULTIVITAMIN WITH MINERALS) TABS tablet Take 1 tablet by mouth daily.    [provider]  Multiple Vitamins-Minerals (HAIR/SKIN/NAILS/BIOTIN PO) Take 1 tablet by mouth daily.    [provider]  Multiple Vitamins-Minerals (Walker) CAPS Take 1 tablet by mouth daily.    [provider]  omeprazole (PRILOSEC) 20 MG capsule Take 20 mg by mouth daily.    [provider]  oxyCODONE-acetaminophen (PERCOCET/ROXICET) 5-325 MG tablet Take 1-2 tablets by mouth every 6 (six) hours as needed for severe pain. 02/06/22   Gary Fleet, PA-C  Probiotic Product (PROBIOTIC PO) Take 1 capsule by mouth daily.    [provider]  tiZANidine (ZANAFLEX) 2 MG tablet Take 1 tablet (2 mg total) by mouth every 8 (eight) hours as needed for muscle spasms. 02/06/22   Gary Fleet, PA-C  triamcinolone cream (KENALOG) 0.1 % Apply 1 application. topically 3 (three) times daily as needed for itching or rash. 12/29/21   [provider]    Physical Exam: Vitals:   10/29/22 2000 10/29/22 2015 10/29/22 2030 10/29/22 2125  BP: (!) 122/91 137/85 128/81 (!) 131/92  Pulse: (!) 105 (!) 101 (!) 108 (!) 110  Resp: 20 (!) 32 (!) 21 18  Temp:      TempSrc:      SpO2: 96% 95% 98% 98%  Weight:        Physical Exam Vitals reviewed.  Constitutional:      General: She is not in acute distress. HENT:     Head: Normocephalic and atraumatic.     Comments: Several excoriated skin lesions on the face and a few lesions on the scalp with surrounding erythema but no purulent drainage Eyes:     Extraocular Movements: Extraocular movements intact.  Cardiovascular:     Rate and Rhythm: Normal rate and regular rhythm.     Pulses: Normal pulses.  Pulmonary:     Effort: Pulmonary effort is normal. No respiratory distress.     Breath sounds: No wheezing or rales.  Abdominal:     General: Bowel sounds are normal. There is no distension.     Palpations: Abdomen is soft.     Tenderness: There is no abdominal tenderness.  Musculoskeletal:     Cervical back: Normal range of motion.     Right lower leg: No edema.     Left lower leg: No edema.  Skin:    General:  Skin is warm and dry.  Neurological:     General: No focal deficit present.     Mental Status: She is alert and oriented to person, place, and time.     Labs on Admission: I have personally reviewed following labs and imaging studies  CBC: Recent Labs  Lab 10/29/22 1419  WBC 11.1*  HGB 9.4*  HCT 31.8*  MCV 75.0*  PLT 671*   Basic Metabolic Panel: Recent Labs  Lab 10/29/22 1419  NA 137  K 4.2  CL 102  CO2 23  GLUCOSE 197*  BUN 29*  CREATININE 1.03*  CALCIUM 9.3   GFR: CrCl cannot be calculated (Unknown ideal weight.). Liver Function Tests: No results for input(s): "AST", "ALT", "ALKPHOS", "BILITOT", "PROT", "ALBUMIN" in the last 168 hours. No results for input(s): "LIPASE", "AMYLASE" in the last 168 hours. No results for input(s): "AMMONIA" in the last 168 hours. Coagulation Profile: No results for input(s): "INR", "PROTIME" in the last 168 hours. Cardiac Enzymes: No results for input(s): "CKTOTAL", "CKMB", "CKMBINDEX", "TROPONINI" in the last 168 hours. BNP (last 3 results) No results for input(s): "PROBNP" in the last 8760 hours. HbA1C: No results for input(s): "HGBA1C" in the last 72 hours. CBG: No results for input(s): "GLUCAP" in the last 168 hours. Lipid Profile: No results for input(s): "CHOL", "HDL", "LDLCALC", "TRIG", "CHOLHDL", "LDLDIRECT" in the last 72 hours. Thyroid Function Tests: No results for input(s): "TSH", "T4TOTAL", "FREET4", "T3FREE", "THYROIDAB" in the last 72 hours. Anemia Panel: No results for input(s): "VITAMINB12", "FOLATE", "FERRITIN", "TIBC", "IRON", "RETICCTPCT" in the last 72 hours. Urine analysis: No results found for: "COLORURINE", "APPEARANCEUR", "LABSPEC", "PHURINE", "GLUCOSEU", "HGBUR", "BILIRUBINUR", "KETONESUR", "PROTEINUR", "UROBILINOGEN", "NITRITE", "LEUKOCYTESUR"  Radiological Exams on Admission: DG Chest 2 View  Result Date: 10/29/2022 CLINICAL DATA:  Shortness of breath with weakness and nonproductive cough. EXAM:  CHEST - 2 VIEW COMPARISON:  10/20/2022 FINDINGS: Low volume film. The cardio pericardial silhouette is enlarged. Left base collapse/consolidation is mildly progressive in the interval and there is a small left pleural effusion. Likely tiny right pleural  effusion. No findings to suggest pulmonary edema. The visualized bony structures of the thorax are unremarkable. IMPRESSION: Interval progression of left base collapse/consolidation with small bilateral pleural effusions. Electronically Signed   By: Misty Stanley M.D.   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  She had an echo done at [facility] yesterday which revealed severely decreased EF of 15%, mild to moderate MR.  BNP 1377.  Chest x-ray showing small bilateral pleural effusions but no pulmonary edema.  She is not hypoxic. -Cardiac monitoring -Patient received IV Lasix 40 mg x 1 in the ED.  Cardiology has been consulted. -Monitor intake and output, daily weights -Low-sodium diet with fluid restriction -Continuous pulse ox, supplemental oxygen as needed to keep oxygen saturation above 94%  Possible pneumonia SIRS Chest x-ray showing interval progression of left base collapse/consolidation.  Patient is not hypoxic.  WBC count borderline elevated at 11.1, slightly tachycardic but afebrile.  Not hypotensive. -CT chest ordered for further evaluation -Continue ceftriaxone and azithromycin -Test for COVID/influenza/RSV -Trend WBC count -Check procalcitonin level -Supplemental oxygen as needed  Mild troponin elevation Likely due to demand ischemia in the setting of acute HFrEF and possible pneumonia.  EKG showing mild T wave inversions inferolaterally. Troponin mildly elevated but flat (23> 28) and not consistent with ACS.  Chest tightness reported by patient appears atypical as it has been ongoing since  November.  She denies any chest pain or tightness at this time. -Cardiac monitoring -Cardiology consulted  Cellulitis of face Patient reports constantly picking at the skin of her face and scalp and history of chronic cellulitis treated with doxycycline.  She has multiple excoriated lesions on her face and a few lesions on her scalp with surrounding erythema but no obvious purulent drainage. -On ceftriaxone as above  Suprapubic pain/pressure Patient received Lasix in the ED and has not urinated since then. -Bladder scans every 4 hours, in and out cath as needed -UA pending to rule out UTI  Microcytic anemia Hemoglobin 9.4, MCV 75.0.  Hemoglobin was previously 12.4 in April 2023.  Patient is not endorsing any symptoms of GI bleed or any other bleeding. -Anemia panel -FOBT  CKD stage IIIa Creatinine 1.0, at baseline. -Continue to monitor renal function  Type 2 diabetes A1c 8.0 in April 2023. -Repeat A1c -Sensitive sliding scale insulin ACHS  Dementia: Delirium precautions. Hypertension: Stable. Hyperlipidemia Depression/mood disorder GERD -Pharmacy med rec pending.  DVT prophylaxis: SCDs until FOBT done. Code Status: DNR/DNI (discussed with the patient) Family Communication: No family available at this time. Consults called: Cardiology Level of care: Telemetry bed Admission status: It is my clinical opinion that admission to INPATIENT is reasonable and necessary because of the expectation that this patient will require hospital care that crosses at least 2 midnights to treat this condition based on the medical complexity of the problems presented.  Given the aforementioned information, the predictability of an adverse outcome is felt to be significant.   Shela Leff MD [facility]  If 7PM-7AM, please contact night-coverage [email or web address]  10/29/2022, 9:29 PM

## 2022-10-29 NOTE — ED Triage Notes (Signed)
SOB, weakness and non productive cough since 10/05/22. Denies n/v/d. Pt was seen at [facility] earlier this week.

## 2022-10-30 ENCOUNTER — Inpatient Hospital Stay (HOSPITAL_COMMUNITY): Payer: PPO

## 2022-10-30 ENCOUNTER — Inpatient Hospital Stay (HOSPITAL_COMMUNITY)
Admission: EM | Disposition: A | Discharge status: Home Health | Payer: Self-pay | Source: Home / Self Care | Attending: Internal Medicine

## 2022-10-30 ENCOUNTER — Other Ambulatory Visit (HOSPITAL_COMMUNITY): Payer: Self-pay

## 2022-10-30 ENCOUNTER — Encounter (HOSPITAL_COMMUNITY): Payer: Self-pay | Admitting: Internal Medicine

## 2022-10-30 DIAGNOSIS — R570 Cardiogenic shock: Secondary | ICD-10-CM

## 2022-10-30 DIAGNOSIS — I2510 Atherosclerotic heart disease of native coronary artery without angina pectoris: Secondary | ICD-10-CM

## 2022-10-30 DIAGNOSIS — I5021 Acute systolic (congestive) heart failure: Secondary | ICD-10-CM | POA: Diagnosis not present

## 2022-10-30 HISTORY — PX: RIGHT/LEFT HEART CATH AND CORONARY ANGIOGRAPHY: CATH118266

## 2022-10-30 LAB — POCT I-STAT EG7
Acid-Base Excess: 0 mmol/L (ref 0.0–2.0)
Acid-base deficit: 1 mmol/L (ref 0.0–2.0)
Acid-base deficit: 4 mmol/L — ABNORMAL HIGH (ref 0.0–2.0)
Bicarbonate: 20.3 mmol/L (ref 20.0–28.0)
Bicarbonate: 24.1 mmol/L (ref 20.0–28.0)
Bicarbonate: 24.7 mmol/L (ref 20.0–28.0)
Calcium, Ion: 0.9 mmol/L — ABNORMAL LOW (ref 1.15–1.40)
Calcium, Ion: 1.2 mmol/L (ref 1.15–1.40)
Calcium, Ion: 1.25 mmol/L (ref 1.15–1.40)
HCT: 26 % — ABNORMAL LOW (ref 36.0–46.0)
HCT: 30 % — ABNORMAL LOW (ref 36.0–46.0)
HCT: 31 % — ABNORMAL LOW (ref 36.0–46.0)
Hemoglobin: 10.2 g/dL — ABNORMAL LOW (ref 12.0–15.0)
Hemoglobin: 10.5 g/dL — ABNORMAL LOW (ref 12.0–15.0)
Hemoglobin: 8.8 g/dL — ABNORMAL LOW (ref 12.0–15.0)
O2 Saturation: 37 %
O2 Saturation: 37 %
O2 Saturation: 45 %
Potassium: 4.1 mmol/L (ref 3.5–5.1)
Potassium: 5 mmol/L (ref 3.5–5.1)
Potassium: 5 mmol/L (ref 3.5–5.1)
Sodium: 137 mmol/L (ref 135–145)
Sodium: 137 mmol/L (ref 135–145)
Sodium: 145 mmol/L (ref 135–145)
TCO2: 21 mmol/L — ABNORMAL LOW (ref 22–32)
TCO2: 25 mmol/L (ref 22–32)
TCO2: 26 mmol/L (ref 22–32)
pCO2, Ven: 34.2 mmHg — ABNORMAL LOW (ref 44–60)
pCO2, Ven: 40.3 mmHg — ABNORMAL LOW (ref 44–60)
pCO2, Ven: 41.3 mmHg — ABNORMAL LOW (ref 44–60)
pH, Ven: 7.374 (ref 7.25–7.43)
pH, Ven: 7.381 (ref 7.25–7.43)
pH, Ven: 7.395 (ref 7.25–7.43)
pO2, Ven: 22 mmHg — CL (ref 32–45)
pO2, Ven: 22 mmHg — CL (ref 32–45)
pO2, Ven: 25 mmHg — CL (ref 32–45)

## 2022-10-30 LAB — GLUCOSE, CAPILLARY
Glucose-Capillary: 166 mg/dL — ABNORMAL HIGH (ref 70–99)
Glucose-Capillary: 187 mg/dL — ABNORMAL HIGH (ref 70–99)
Glucose-Capillary: 227 mg/dL — ABNORMAL HIGH (ref 70–99)
Glucose-Capillary: 211 mg/dL — ABNORMAL HIGH (ref 70–99)
Glucose-Capillary: 142 mg/dL — ABNORMAL HIGH (ref 70–99)

## 2022-10-30 LAB — RETICULOCYTES
Immature Retic Fract: 12 % (ref 2.3–15.9)
RBC.: 4.35 MIL/uL (ref 3.87–5.11)
Retic Count, Absolute: 72.2 10*3/uL (ref 19.0–186.0)
Retic Ct Pct: 1.7 % (ref 0.4–3.1)

## 2022-10-30 LAB — BASIC METABOLIC PANEL
Anion gap: 14 (ref 5–15)
BUN: 28 mg/dL — ABNORMAL HIGH (ref 8–23)
CO2: 20 mmol/L — ABNORMAL LOW (ref 22–32)
Calcium: 9 mg/dL (ref 8.9–10.3)
Chloride: 102 mmol/L (ref 98–111)
Creatinine, Ser: 1.1 mg/dL — ABNORMAL HIGH (ref 0.44–1.00)
GFR, Estimated: 51 mL/min — ABNORMAL LOW (ref >60)
Glucose, Bld: 203 mg/dL — ABNORMAL HIGH (ref 70–99)
Potassium: 3.9 mmol/L (ref 3.5–5.1)
Sodium: 136 mmol/L (ref 135–145)

## 2022-10-30 LAB — CBC
HCT: 32.7 % — ABNORMAL LOW (ref 36.0–46.0)
HCT: 27.8 % — ABNORMAL LOW (ref 36.0–46.0)
Hemoglobin: 9.5 g/dL — ABNORMAL LOW (ref 12.0–15.0)
Hemoglobin: 8.7 g/dL — ABNORMAL LOW (ref 12.0–15.0)
MCH: 21.8 pg — ABNORMAL LOW (ref 26.0–34.0)
MCH: 22.8 pg — ABNORMAL LOW (ref 26.0–34.0)
MCHC: 29.1 g/dL — ABNORMAL LOW (ref 30.0–36.0)
MCHC: 31.3 g/dL (ref 30.0–36.0)
MCV: 75 fL — ABNORMAL LOW (ref 80.0–100.0)
MCV: 73 fL — ABNORMAL LOW (ref 80.0–100.0)
Platelets: 387 10*3/uL (ref 150–400)
Platelets: 399 10*3/uL (ref 150–400)
RBC: 4.36 MIL/uL (ref 3.87–5.11)
RBC: 3.81 MIL/uL — ABNORMAL LOW (ref 3.87–5.11)
RDW: 17.6 % — ABNORMAL HIGH (ref 11.5–15.5)
RDW: 17.3 % — ABNORMAL HIGH (ref 11.5–15.5)
WBC: 11.3 10*3/uL — ABNORMAL HIGH (ref 4.0–10.5)
WBC: 10.1 10*3/uL (ref 4.0–10.5)
nRBC: 0.2 % (ref 0.0–0.2)
nRBC: 0.2 % (ref 0.0–0.2)

## 2022-10-30 LAB — HEPATIC FUNCTION PANEL
ALT: 48 U/L — ABNORMAL HIGH (ref 0–44)
AST: 36 U/L (ref 15–41)
Albumin: 3.2 g/dL — ABNORMAL LOW (ref 3.5–5.0)
Alkaline Phosphatase: 96 U/L (ref 38–126)
Bilirubin, Direct: 0.2 mg/dL (ref 0.0–0.2)
Indirect Bilirubin: 0.4 mg/dL (ref 0.3–0.9)
Total Bilirubin: 0.6 mg/dL (ref 0.3–1.2)
Total Protein: 6 g/dL — ABNORMAL LOW (ref 6.5–8.1)

## 2022-10-30 LAB — POCT I-STAT 7, (LYTES, BLD GAS, ICA,H+H)
Acid-base deficit: 3 mmol/L — ABNORMAL HIGH (ref 0.0–2.0)
Bicarbonate: 19.7 mmol/L — ABNORMAL LOW (ref 20.0–28.0)
Calcium, Ion: 1.03 mmol/L — ABNORMAL LOW (ref 1.15–1.40)
HCT: 28 % — ABNORMAL LOW (ref 36.0–46.0)
Hemoglobin: 9.5 g/dL — ABNORMAL LOW (ref 12.0–15.0)
O2 Saturation: 100 %
Potassium: 4.6 mmol/L (ref 3.5–5.1)
Sodium: 140 mmol/L (ref 135–145)
TCO2: 20 mmol/L — ABNORMAL LOW (ref 22–32)
pCO2 arterial: 27.1 mmHg — ABNORMAL LOW (ref 32–48)
pH, Arterial: 7.469 — ABNORMAL HIGH (ref 7.35–7.45)
pO2, Arterial: 162 mmHg — ABNORMAL HIGH (ref 83–108)

## 2022-10-30 LAB — RESP PANEL BY RT-PCR (RSV, FLU A&B, COVID)  RVPGX2
Influenza A by PCR: NEGATIVE
Influenza B by PCR: NEGATIVE
Resp Syncytial Virus by PCR: NEGATIVE
SARS Coronavirus 2 by RT PCR: NEGATIVE

## 2022-10-30 LAB — CREATININE, SERUM
Creatinine, Ser: 1.37 mg/dL — ABNORMAL HIGH (ref 0.44–1.00)
GFR, Estimated: 40 mL/min — ABNORMAL LOW (ref >60)

## 2022-10-30 LAB — MRSA NEXT GEN BY PCR, NASAL: MRSA by PCR Next Gen: DETECTED — AB

## 2022-10-30 LAB — IRON AND TIBC
Iron: 44 ug/dL (ref 28–170)
Saturation Ratios: 12 % (ref 10.4–31.8)
TIBC: 378 ug/dL (ref 250–450)
UIBC: 334 ug/dL

## 2022-10-30 LAB — COOXEMETRY PANEL
Carboxyhemoglobin: 1.7 % — ABNORMAL HIGH (ref 0.5–1.5)
Methemoglobin: 0.7 % (ref 0.0–1.5)
O2 Saturation: 63.3 %
Total hemoglobin: 9.2 g/dL — ABNORMAL LOW (ref 12.0–16.0)

## 2022-10-30 LAB — FOLATE: Folate: >40 ng/mL (ref >5.9)

## 2022-10-30 LAB — HEMOGLOBIN A1C
Hgb A1c MFr Bld: 7.7 % — ABNORMAL HIGH (ref 4.8–5.6)
Mean Plasma Glucose: 174.29 mg/dL

## 2022-10-30 LAB — VITAMIN B12: Vitamin B-12: 297 pg/mL (ref 180–914)

## 2022-10-30 LAB — PROCALCITONIN: Procalcitonin: <0.1 ng/mL

## 2022-10-30 LAB — FERRITIN: Ferritin: 12 ng/mL (ref 11–307)

## 2022-10-30 SURGERY — RIGHT/LEFT HEART CATH AND CORONARY ANGIOGRAPHY
Anesthesia: LOCAL

## 2022-10-30 MED ORDER — POTASSIUM CHLORIDE CRYS ER 20 MEQ PO TBCR
40.0000 meq | EXTENDED_RELEASE_TABLET | Freq: Once | ORAL | Status: AC
  Administered 2022-10-30: 40 meq via ORAL
  Filled 2022-10-30: qty 2

## 2022-10-30 MED ORDER — SODIUM CHLORIDE 0.9 % IV SOLN: 250.0000 mL | INTRAVENOUS | Status: DC | PRN

## 2022-10-30 MED ORDER — HEPARIN (PORCINE) IN NACL 1000-0.9 UT/500ML-% IV SOLN
INTRAVENOUS | Status: AC
  Filled 2022-10-30: qty 500

## 2022-10-30 MED ORDER — SODIUM CHLORIDE 0.9 % IV SOLN: INTRAVENOUS | Status: DC

## 2022-10-30 MED ORDER — CHLORHEXIDINE GLUCONATE CLOTH 2 % EX PADS
6.0000 | MEDICATED_PAD | Freq: Every day | CUTANEOUS | Status: DC
  Administered 2022-10-30 – 2022-11-06 (×9): 6 via TOPICAL

## 2022-10-30 MED ORDER — LOSARTAN POTASSIUM 25 MG PO TABS
12.5000 mg | ORAL_TABLET | Freq: Every day | ORAL | Status: DC
  Administered 2022-10-30: 12.5 mg via ORAL
  Filled 2022-10-30: qty 1

## 2022-10-30 MED ORDER — ONDANSETRON HCL 4 MG/2ML IJ SOLN
4.0000 mg | Freq: Four times a day (QID) | INTRAMUSCULAR | Status: DC | PRN
  Administered 2022-10-30: 4 mg via INTRAVENOUS
  Filled 2022-10-30: qty 2

## 2022-10-30 MED ORDER — FUROSEMIDE 10 MG/ML IJ SOLN: 80.0000 mg | Freq: Two times a day (BID) | INTRAMUSCULAR | Status: DC

## 2022-10-30 MED ORDER — MILRINONE LACTATE IN DEXTROSE 20-5 MG/100ML-% IV SOLN
INTRAVENOUS | Status: AC | PRN
  Administered 2022-10-30: .25 ug/kg/min via INTRAVENOUS

## 2022-10-30 MED ORDER — LABETALOL HCL 5 MG/ML IV SOLN: 10.0000 mg | INTRAVENOUS | Status: AC | PRN

## 2022-10-30 MED ORDER — ASPIRIN 81 MG PO CHEW: 81.0000 mg | CHEWABLE_TABLET | ORAL | Status: DC

## 2022-10-30 MED ORDER — SODIUM CHLORIDE 0.9% FLUSH
3.0000 mL | Freq: Two times a day (BID) | INTRAVENOUS | Status: DC
  Administered 2022-10-31 – 2022-11-06 (×10): 3 mL via INTRAVENOUS

## 2022-10-30 MED ORDER — SPIRONOLACTONE 25 MG PO TABS
25.0000 mg | ORAL_TABLET | Freq: Every day | ORAL | Status: DC
  Administered 2022-10-30 – 2022-11-01 (×3): 25 mg via ORAL
  Filled 2022-10-30 (×3): qty 1

## 2022-10-30 MED ORDER — LIDOCAINE HCL (PF) 1 % IJ SOLN
INTRAMUSCULAR | Status: DC | PRN
  Administered 2022-10-30 (×2): 2 mL

## 2022-10-30 MED ORDER — BUSPIRONE HCL 10 MG PO TABS
10.0000 mg | ORAL_TABLET | Freq: Three times a day (TID) | ORAL | Status: DC
  Administered 2022-10-30 – 2022-11-06 (×22): 10 mg via ORAL
  Filled 2022-10-30 (×22): qty 1

## 2022-10-30 MED ORDER — SODIUM CHLORIDE 0.9% FLUSH: 3.0000 mL | INTRAVENOUS | Status: DC | PRN

## 2022-10-30 MED ORDER — DULOXETINE HCL 60 MG PO CPEP
60.0000 mg | ORAL_CAPSULE | Freq: Every day | ORAL | Status: DC
  Administered 2022-10-30 – 2022-11-06 (×8): 60 mg via ORAL
  Filled 2022-10-30 (×8): qty 1

## 2022-10-30 MED ORDER — TRIAMCINOLONE ACETONIDE 0.1 % EX CREA
TOPICAL_CREAM | Freq: Two times a day (BID) | CUTANEOUS | Status: DC
  Administered 2022-10-30 – 2022-11-05 (×2): 1 via TOPICAL
  Filled 2022-10-30 (×5): qty 15

## 2022-10-30 MED ORDER — VERAPAMIL HCL 2.5 MG/ML IV SOLN
INTRAVENOUS | Status: AC
  Filled 2022-10-30: qty 2

## 2022-10-30 MED ORDER — NOREPINEPHRINE 4 MG/250ML-% IV SOLN
INTRAVENOUS | Status: AC
  Filled 2022-10-30: qty 250

## 2022-10-30 MED ORDER — FUROSEMIDE 10 MG/ML IJ SOLN: 40.0000 mg | Freq: Two times a day (BID) | INTRAMUSCULAR | Status: DC

## 2022-10-30 MED ORDER — IOHEXOL 350 MG/ML SOLN
INTRAVENOUS | Status: DC | PRN
  Administered 2022-10-30: 30 mL

## 2022-10-30 MED ORDER — MILRINONE LACTATE IN DEXTROSE 20-5 MG/100ML-% IV SOLN
0.1250 ug/kg/min | INTRAVENOUS | Status: DC
  Administered 2022-10-31 – 2022-11-01 (×3): 0.375 ug/kg/min via INTRAVENOUS
  Administered 2022-11-02 – 2022-11-03 (×3): 0.25 ug/kg/min via INTRAVENOUS
  Administered 2022-11-04: 0.125 ug/kg/min via INTRAVENOUS
  Filled 2022-10-30 (×9): qty 100

## 2022-10-30 MED ORDER — ATORVASTATIN CALCIUM 10 MG PO TABS: 10.0000 mg | ORAL_TABLET | Freq: Every day | ORAL | Status: DC

## 2022-10-30 MED ORDER — ACETAMINOPHEN 325 MG PO TABS
650.0000 mg | ORAL_TABLET | ORAL | Status: DC | PRN
  Administered 2022-11-03 (×2): 650 mg via ORAL
  Filled 2022-10-30 (×2): qty 2

## 2022-10-30 MED ORDER — ENOXAPARIN SODIUM 40 MG/0.4ML IJ SOSY
40.0000 mg | PREFILLED_SYRINGE | INTRAMUSCULAR | Status: DC
  Administered 2022-10-31 – 2022-11-06 (×7): 40 mg via SUBCUTANEOUS
  Filled 2022-10-30 (×7): qty 0.4

## 2022-10-30 MED ORDER — HYDRALAZINE HCL 20 MG/ML IJ SOLN: 10.0000 mg | INTRAMUSCULAR | Status: AC | PRN

## 2022-10-30 MED ORDER — ATORVASTATIN CALCIUM 10 MG PO TABS
10.0000 mg | ORAL_TABLET | ORAL | Status: DC
  Administered 2022-11-04: 10 mg via ORAL
  Filled 2022-10-30 (×2): qty 1

## 2022-10-30 MED ORDER — SODIUM CHLORIDE 0.9% IV SOLUTION: INTRAVENOUS | Status: DC

## 2022-10-30 MED ORDER — FOLIC ACID 1 MG PO TABS
1.0000 mg | ORAL_TABLET | Freq: Every day | ORAL | Status: DC
  Administered 2022-10-30 – 2022-11-06 (×8): 1 mg via ORAL
  Filled 2022-10-30 (×8): qty 1

## 2022-10-30 MED ORDER — HEPARIN SODIUM (PORCINE) 1000 UNIT/ML IJ SOLN
INTRAMUSCULAR | Status: AC
  Filled 2022-10-30: qty 10

## 2022-10-30 MED ORDER — ONDANSETRON HCL 4 MG/2ML IJ SOLN: 4.0000 mg | Freq: Four times a day (QID) | INTRAMUSCULAR | Status: DC | PRN

## 2022-10-30 MED ORDER — FUROSEMIDE 10 MG/ML IJ SOLN
80.0000 mg | Freq: Two times a day (BID) | INTRAMUSCULAR | Status: DC
  Administered 2022-10-30 – 2022-10-31 (×2): 80 mg via INTRAVENOUS
  Filled 2022-10-30 (×2): qty 8

## 2022-10-30 MED ORDER — MUPIROCIN 2 % EX OINT
TOPICAL_OINTMENT | Freq: Two times a day (BID) | CUTANEOUS | Status: DC
  Administered 2022-10-30 – 2022-11-05 (×2): 1 via NASAL
  Filled 2022-10-30 (×4): qty 22

## 2022-10-30 MED ORDER — SODIUM CHLORIDE 0.9% FLUSH
3.0000 mL | Freq: Two times a day (BID) | INTRAVENOUS | Status: DC
  Administered 2022-10-30 – 2022-11-06 (×12): 3 mL via INTRAVENOUS

## 2022-10-30 MED ORDER — DONEPEZIL HCL 5 MG PO TABS
5.0000 mg | ORAL_TABLET | Freq: Every day | ORAL | Status: DC
  Administered 2022-10-30 – 2022-11-05 (×7): 5 mg via ORAL
  Filled 2022-10-30 (×9): qty 1

## 2022-10-30 MED ORDER — VERAPAMIL HCL 2.5 MG/ML IV SOLN
INTRAVENOUS | Status: DC | PRN
  Administered 2022-10-30: 10 mL via INTRA_ARTERIAL

## 2022-10-30 MED ORDER — SODIUM CHLORIDE 0.9% IV SOLUTION: INTRAVENOUS | Status: DC | PRN

## 2022-10-30 MED ORDER — LIDOCAINE HCL (PF) 1 % IJ SOLN
INTRAMUSCULAR | Status: AC
  Filled 2022-10-30: qty 30

## 2022-10-30 SURGICAL SUPPLY — 15 items
CATH 5FR JL3.5 JR4 ANG PIG MP (CATHETERS) IMPLANT
CATH SWAN GANZ 7F STRAIGHT (CATHETERS) IMPLANT
DEVICE RAD COMP TR BAND LRG (VASCULAR PRODUCTS) IMPLANT
GLIDESHEATH SLEND SS 6F .021 (SHEATH) IMPLANT
GUIDEWIRE INQWIRE 1.5J.035X260 (WIRE) IMPLANT
INQWIRE 1.5J .035X260CM (WIRE) ×1
KIT CV 3L 7FR 20CM SULFAFREE (SET/KITS/TRAYS/PACK) IMPLANT
KIT MICROPUNCTURE NIT STIFF (SHEATH) IMPLANT
PACK CARDIAC CATHETERIZATION (CUSTOM PROCEDURE TRAY) ×1 IMPLANT
PROTECTION STATION PRESSURIZED (MISCELLANEOUS) ×1
SHEATH PINNACLE 7F 10CM (SHEATH) IMPLANT
SHEATH PROBE COVER 6X72 (BAG) IMPLANT
SLEEVE REPOSITIONING LENGTH 30 (MISCELLANEOUS) IMPLANT
STATION PROTECTION PRESSURIZED (MISCELLANEOUS) IMPLANT
TRANSDUCER W/STOPCOCK (MISCELLANEOUS) ×1 IMPLANT

## 2022-10-30 NOTE — Consult Note (Addendum)
Advanced Heart Failure Team Consult Note   Primary Physician: Caryl Bis, MD PCP-Cardiologist:  None  Reason for Consultation: Acute systolic heart failure  HPI:    Erica Cervantes is seen today for evaluation of acute systolic heart failure at the request of Dr. Broadus John, hospital medicine.   Erica Cervantes is a 77 y.o. female with DMII, CKD, HTN, cognitive deficits, GERD, chronic pain, and fibromyalgia.  Erica Cervantes presented to [facility] 1/9 and again 1/17 with SOB, fatigue, weakness, and decreased appetite. Chest tightness for 3 weeks with SOB. Scheduled her for an echo. Had been going on for about 3 weeks per patient report. She was trying to wait it out as she thought she had a cold. She lives at home with a roommate. Reports compliance with all medications. Usually independent and able to do all ADLs on he own. During the last 3 weeks she has been SOB while walking from her bedroom to her bathroom. Has been having to use her walker recently. Her appetite has been minimal as well d/t abdominal bloating, she only eats the food she makes because she has to cook for her dogs as well. N/V and bad diarrhea early January. Doesn't eat out, denies smoking. Occasionally will have a mixed drink or wine on the weekend.  Some family cardiac history but she doesn't recall exactly what. Sister has a fib and her son has something cardiac related for which he's on meds.   At [facility] echo significant for EF 15%. Sent to [facility] for further workup. Since being here, BNP 1377, HsTrop 23>28, CT chest and CXR with bilateral pleural effusions, Hgb A1c 7.7, Cr 1.03, resp panel (-), UA (-), and BC NGTD. Has been diuresing with IV lasix.  In bed, feels ok just slightly SOB. Denies CP.   Echo 1/17: EF 15%, LV mod-sev dilated, LA mildly dilated, RV function normal, mod pulmonary hypertension, mod MR  Home Medications Prior to Admission medications   Medication Sig Start Date End Date Taking? Authorizing Provider   amphetamine-dextroamphetamine (ADDERALL) 20 MG tablet Take 20-40 mg by mouth See admin instructions. Take 40 mg (2 tablets) by mouth in the morning and 20 mg (1 tablet) at 2 pm 10/26/17  Yes [provider]  atorvastatin (LIPITOR) 10 MG tablet Take 10 mg by mouth every Wednesday.   Yes [provider]  busPIRone (BUSPAR) 30 MG tablet Take 30 mg by mouth 3 (three) times daily. 08/23/17  Yes [provider]  Calcium Carb-Cholecalciferol (CALCIUM 500+D PO) Take 1 tablet by mouth daily.   Yes [provider]  cholecalciferol (VITAMIN D3) 25 MCG (1000 UNIT) tablet Take 2,000 Units by mouth daily.   Yes [provider]  cycloSPORINE (RESTASIS) 0.05 % ophthalmic emulsion Place 2 drops into both eyes 2 (two) times daily.   Yes [provider]  donepezil (ARICEPT) 5 MG tablet Take 5 mg by mouth at bedtime.   Yes [provider]  DULoxetine (CYMBALTA) 60 MG capsule Take 120 mg by mouth daily at 12 noon. 09/25/17  Yes [provider]  folic acid (FOLVITE) 947 MCG tablet Take 800-2,400 mcg by mouth daily.   Yes [provider]  Guaifenesin (MUCINEX MAXIMUM STRENGTH) 1200 MG TB12 Take 1,200 mg by mouth daily as needed (severe congestion).   Yes [provider]  lisinopril-hydrochlorothiazide (ZESTORETIC) 10-12.5 MG tablet Take 1 tablet by mouth daily.   Yes [provider]  meloxicam (MOBIC) 15 MG tablet Take 15 mg  by mouth daily. 07/31/22  Yes [provider]  metFORMIN (GLUCOPHAGE-XR) 500 MG 24 hr tablet Take 1,000 mg by mouth 2 (two) times daily. 09/11/17  Yes [provider]  Multiple Minerals-Vitamins (CAL MAG ZINC +D3) TABS Take 1 tablet by mouth daily.   Yes [provider]  Multiple Vitamin (MULTIVITAMIN WITH MINERALS) TABS tablet Take 1 tablet by mouth daily.   Yes [provider]  omeprazole (PRILOSEC) 20 MG capsule Take 20 mg by mouth daily.   Yes [provider]  Probiotic Product (PROBIOTIC PO) Take 1 capsule by mouth daily.   Yes [provider]  triamcinolone cream (KENALOG) 0.1 % Apply 1 application. topically 3 (three) times daily as needed for itching or rash. 12/29/21  Yes [provider]    Past Medical History: Past Medical History:  Diagnosis Date   Chronic kidney disease    stage 4   Depression    Diabetes mellitus without complication (HCC)    GERD (gastroesophageal reflux disease)    Hypertension    Neuromuscular disorder ([place])    peripheral neuropathy    Past Surgical History: Past Surgical History:  Procedure Laterality Date   ABDOMINAL HYSTERECTOMY  51   CHOLECYSTECTOMY     age 15   DILATION AND CURETTAGE OF UTERUS     x 4  1980   TONSILLECTOMY     age 34   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   Procedure: TOTAL HIP ARTHROPLASTY ANTERIOR APPROACH;  Surgeon: Dorna Leitz, MD;  Location: [facility];  Service: Orthopedics;  Laterality: Right;    Family History: Family History  Problem Relation Age of Onset   Cancer Mother    Cancer Father    Heart attack Brother    Polycystic kidney disease Brother    Cancer Other     Social History: Social History   Socioeconomic History   Marital status: Single    Spouse name: Not on file   Number of children: Not on file   Years of education: Not on file   Highest education level: Not on file  Occupational History   Not on file  Tobacco Use   Smoking status: Never   Smokeless tobacco: Never  Vaping Use   Vaping Use: Never used  Substance and Sexual Activity   Alcohol use: No   Drug use: No   Sexual activity: Not on file  Other Topics Concern   Not on file  Social History Narrative   Not on file   Social Determinants of Health   Financial Resource Strain: Not on file  Food Insecurity: Not on file  Transportation Needs: Not on file  Physical Activity: Not on file  Stress: Not on file  Social Connections: Not on file    Allergies:   Allergies  Allergen Reactions   Sulfa Antibiotics Shortness Of Breath   Ace Inhibitors Cough    Pt tolerates lisinopril    Codeine Itching and Swelling   Penicillins     Unknown reaction     Statins     Body aches    Tramadol     Numbness    Zetia [Ezetimibe]     Myalgia    Neosporin [Neomycin-Bacitracin Zn-Polymyx] Rash    Objective:    Vital Signs:   Temp:  [97.4 F (36.3 C)-98.2 F (36.8 C)] 97.7 F (36.5 C) (01/19 0724) Pulse Rate:  [97-110] 97 (01/19 0724) Resp:  [18-32] 20 (01/19 0724) BP: (122-139)/(74-99) 122/95 (01/19 0724) SpO2:  [  95 %-99 %] 97 % (01/19 0724) Weight:  [65.8 kg-67.1 kg] 67.1 kg (01/19 0039) Last BM Date : 10/29/22  Weight change: Filed Weights   10/29/22 1417 10/30/22 0039  Weight: 65.8 kg 67.1 kg    Intake/Output:   Intake/Output Summary (Last 24 hours) at 10/30/2022 1054 Last data filed at 10/30/2022 0824 Gross per 24 hour  Intake 60 ml  Output 1000 ml  Net -940 ml     Physical Exam    General:  chronically ill appearing.  Generalized scabbing and bruising all over body.  HEENT: normal Neck: supple. JVD to ear cm. Carotids 2+ bilat; no bruits. No lymphadenopathy or thyromegaly appreciated. Cor: PMI nondisplaced. Regular rate & rhythm. No rubs, gallops or murmurs. Lungs: course bases Abdomen: soft, nontender, slightly distended. No hepatosplenomegaly. No bruits or masses. Good bowel sounds. Extremities: no cyanosis, clubbing, edema. Cool extremities. Neuro: alert & oriented x 3, cranial nerves grossly intact. moves all 4 extremities w/o difficulty. Affect pleasant.   Telemetry   ST low 100s, 0-6 PVCs/min (Personally reviewed)    EKG    EKG ST 109 bpm  Labs   Basic Metabolic Panel: Recent Labs  Lab 10/29/22 1419 10/30/22 0115  NA 137 136  K 4.2 3.9  CL 102 102  CO2 23 20*  GLUCOSE 197* 203*  BUN 29* 28*  CREATININE 1.03* 1.10*  CALCIUM 9.3 9.0    Liver Function Tests: Recent Labs  Lab 10/30/22 0115  AST  36  ALT 48*  ALKPHOS 96  BILITOT 0.6  PROT 6.0*  ALBUMIN 3.2*   No results for input(s): "LIPASE", "AMYLASE" in the last 168 hours. No results for input(s): "AMMONIA" in the last 168 hours.  CBC: Recent Labs  Lab 10/29/22 1419 10/30/22 0115  WBC 11.1* 11.3*  HGB 9.4* 9.5*  HCT 31.8* 32.7*  MCV 75.0* 75.0*  PLT 453* 387    Cardiac Enzymes: No results for input(s): "CKTOTAL", "CKMB", "CKMBINDEX", "TROPONINI" in the last 168 hours.  BNP: BNP (last 3 results) Recent Labs    10/29/22 1419  BNP 1,377.3*    ProBNP (last 3 results) No results for input(s): "PROBNP" in the last 8760 hours.   CBG: Recent Labs  Lab 10/30/22 0023 10/30/22 0621 10/30/22 1052  GLUCAP 187* 166* 227*    Coagulation Studies: No results for input(s): "LABPROT", "INR" in the last 72 hours.   Imaging   CT CHEST WO CONTRAST  Result Date: 10/29/2022 CLINICAL DATA:  Shortness of breath and weakness, increasing left basilar infiltrate on recent chest x-ray EXAM: CT CHEST WITHOUT CONTRAST TECHNIQUE: Multidetector CT imaging of the chest was performed following the standard protocol without IV contrast. RADIATION DOSE REDUCTION: This exam was performed according to the departmental dose-optimization program which includes automated exposure control, adjustment of the mA and/or kV according to patient size and/or use of iterative reconstruction technique. COMPARISON:  Chest x-ray from earlier the same day. FINDINGS: Cardiovascular: Somewhat limited due to lack of IV contrast. Atherosclerotic calcifications of the aorta are noted. No cardiac enlargement is seen. Pulmonary artery is not significantly enlarged. Mediastinum/Nodes: Thoracic inlet is within normal limits. No hilar or mediastinal adenopathy is noted. The esophagus as visualized is within normal limits. Lungs/Pleura: Large bilateral pleural effusions are noted with underlying lower lobe atelectatic changes. These have worsened in the interval from  the prior exam. The remainder of the aerated lung appears within normal limits. Upper Abdomen: Visualized upper abdomen is within normal limits. Gallbladder has been surgically removed. Musculoskeletal:  Degenerative changes of the thoracic spine are noted. No acute rib abnormality is noted. IMPRESSION: Large bilateral pleural effusions with underlying lower lobe atelectatic changes. This has worsened in the interval from the prior exam particularly on the right. Aortic Atherosclerosis (ICD10-I70.0). Electronically Signed   By: Inez Catalina M.D.   On: 10/29/2022 23:21   DG Chest 2 View  Result Date: 10/29/2022 CLINICAL DATA:  Shortness of breath with weakness and nonproductive cough. EXAM: CHEST - 2 VIEW COMPARISON:  10/20/2022 FINDINGS: Low volume film. The cardio pericardial silhouette is enlarged. Left base collapse/consolidation is mildly progressive in the interval and there is a small left pleural effusion. Likely tiny right pleural effusion. No findings to suggest pulmonary edema. The visualized bony structures of the thorax are unremarkable. IMPRESSION: Interval progression of left base collapse/consolidation with small bilateral pleural effusions. Electronically Signed   By: Misty Stanley M.D.   On: 10/29/2022 15:29    ECHO [facility] 1/16:   1. The left ventricle is moderately to severely dilated in size with normal  wall thickness.    2. The left ventricular systolic function is severely decreased, LVEF is  visually estimated at 15%.    3. There is mild to moderate centrally directed mitral regurgitation.    4. The left atrium is mildly dilated in size.    5. The right ventricle is normal in size, with normal systolic function.    6. There is moderate pulmonary hypertension.    7. IVC size and inspiratory change suggest mildly elevated right atrial  pressure. (5-10 mmHg).   Medications:     Current Medications:  [START ON 11/04/2022] atorvastatin  10 mg Oral Weekly   busPIRone  10  mg Oral TID   donepezil  5 mg Oral QHS   DULoxetine  60 mg Oral Daily   folic acid  1 mg Oral Daily   furosemide  40 mg Intravenous Q12H   insulin aspart  0-5 Units Subcutaneous QHS   insulin aspart  0-9 Units Subcutaneous TID WC   mupirocin ointment   Nasal BID   potassium chloride  40 mEq Oral Once   spironolactone  25 mg Oral Daily    Infusions:   Patient Profile   Erica Cervantes is a 77 y.o. female with DMII, CKD, HTN, cognitive deficits, GERD, chronic pain, and fibromyalgia. Presented to [facility] with SOB, cough and generalized weakness. Echo showed EF 15%. Transferred to [facility] for further evaluation. AHF team asked to see for acute systolic heart failure.   Assessment/Plan  Acute systolic heart failure - At [facility] echo showed EF 15%, LV mod-sev dilated, LA mildly dilated, RV function normal, mod pulmonary hypertension, mod MR - Etiology unknown. Possible CAD (risk factors: DM, HTN, obesity). HsTrop mildly elevated on admission 23>>28. Lipid panel, TSH, HIV ordered. NYHA IV on admission.  - Remains volume overloaded. Currently on 40 IV lasix BID, will increase to 80 IV lasix BID - Plan for [facility] once better diuresed. Suspect she may be low-output - Place PICC. May need inotropic support - Trend co-ox and CVP - Can consider Digoxin.  - Continue Spironolactone '25mg'$  daily - add SGLT2i next - avoid BB with acute exacerbation - start losartan 12.5 daily - may need cMRI - Strict I&O, daily weights  Elevated troponin - 23>>28, EKG ST 109 bpm - Denies CP, suspect demand ischemia  - LHC to r/o CAD  Bilateral pleural effusions - Seen on CT and CXR 1/18, suspect 2/2 volume overload -  Plan to repeat CXR 2-3 days, should improve with diuresis  DMII - Hgb A1c 7.7 - Continue SSI per primary - plan for eventual SGLT2i  Microcytic anemia - anemia panel tSat 12 and ferritin 12. Will do feraheme AFTER cardiac workup complete.  6. Hypertension - mildly elevated. Adding  GDMT as above   Length of Stay: [name], NP  10/30/2022, 10:54 AM  Advanced Heart Failure Team Pager [phone] (M-F; 7a - 5p)  Please contact [facility] for night-coverage after hours (4p -7a ) and weekends on [email or web address]   Patient seen and examined with the above-signed Advanced Practice Provider and/or Housestaff. I personally reviewed laboratory data, imaging studies and relevant notes. I independently examined the patient and formulated the important aspects of the plan. I have edited the note to reflect any of my changes or salient points. I have personally discussed the plan with the patient and/or family.  76y/o woman as above with DM2, HTN and cognitive delay. Admitted for several month h/o progressive HF symptoms and chest tightness  EF 15% by echo at [facility].  Trop negative   Symptoms improved with diuresis. ECG with non-specific ST abnormalities  General:  Ill appearing. No resp difficulty HEENT: normal Neck: supple. JVP to ear Carotids 2+ bilat; no bruits. No lymphadenopathy or thryomegaly appreciated. Cor: PMI nondisplaced. Regular rate & rhythm. No rubs, gallops or murmurs. Lungs: dull at bases.  Abdomen: soft, nontender, nondistended. No hepatosplenomegaly. No bruits or masses. Good bowel sounds. Extremities: no cyanosis, clubbing, rash, 1+ edema Neuro: alert & orientedx3, cranial nerves grossly intact. moves all 4 extremities w/o difficulty. Affect pleasant  Patient with acute HF with severely reduced EF. Etiology unclear. Multiple CRFs. Will plan R/L cath today. Continue diuresis. Titrate GDMT as tolerated. If no significant CAD -> consider cMRI  Glori Bickers, MD  5:43 PM
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
## 2022-10-30 NOTE — TOC Initial Note (Signed)
Transition of Care [facility]) - Initial/Assessment Note    Patient Details  Name: Erica Cervantes MRN: [id number] Date of Birth: [birth year]  [facility] CM/SW Contact:    Erenest Rasher, RN Phone Number: [phone] 10/30/2022, 3:48 PM  Clinical Narrative:                  HF TOC spoke to pt and dtr, Judeen Hammans at bedside. Pt lives alone but has a caregiver, Arbie Cookey that assist in the home. Her sister, Georgie Chard is her POA. Pt reports she had HH in the past but was not satisfied with agency, [phone]. If [place] needed she is requiring different agency. Contacted [facility] and states [place] or [facility] will work for [employer]. Waiting PT/OT evaluation, pt may need IP rehab vs SNF. Sister verbalized understanding.   Has needed DME at home.      Expected Discharge Plan: [place] Barriers to Discharge: Continued Medical Work up   Patient Goals and CMS Choice Patient states their goals for this hospitalization and ongoing recovery are:: wants to recover CMS Medicare.gov Compare Post Acute Care list provided to:: Patient Choice offered to / list presented to : Patient      Expected Discharge Plan and Services   Discharge Planning Services: CM Consult Post Acute Care Choice: [place] arrangements for the past 2 months: Single Family Home                                      Prior Living Arrangements/Services Living arrangements for the past 2 months: Single Family Home Lives with:: Self   Do you feel safe going back to the place where you live?: Yes      Need for Family Participation in Patient Care: Yes (Comment) Care giver support system in place?: Yes (comment) Current home services: DME (rolling walker, rolling walker with seat, lift chair, transport chair)    Activities of Daily Living Home Assistive Devices/Equipment: None ADL Screening (condition at time of admission) Patient's cognitive ability adequate to safely complete daily  activities?: Yes Is the patient deaf or have difficulty hearing?: No Does the patient have difficulty seeing, even when wearing glasses/contacts?: No Does the patient have difficulty concentrating, remembering, or making decisions?: No Patient able to express need for assistance with ADLs?: Yes Does the patient have difficulty dressing or bathing?: No Independently performs ADLs?: Yes (appropriate for developmental age) Does the patient have difficulty walking or climbing stairs?: No Weakness of Legs: None Weakness of Arms/Hands: None  Permission Sought/Granted Permission sought to share information with : Case Manager, Family Supports, PCP Permission granted to share information with : Yes, Verbal Permission Granted  Share Information with NAME: [place] granted to share info w Relationship: sister  Permission granted to share info w Contact Information: [phone]  Emotional Assessment Appearance:: Appears stated age Attitude/Demeanor/Rapport: Engaged Affect (typically observed): Accepting Orientation: : Oriented to Self, Oriented to Place, Oriented to  Time, Oriented to Situation   Psych Involvement: No (comment)  Admission diagnosis:  Shortness of breath [R06.02] Hypervolemia, unspecified hypervolemia type [E87.70] Community acquired pneumonia, unspecified laterality [J18.9] Acute on chronic HFrEF (heart failure with reduced ejection fraction) (HCC) [I50.23] Heart failure, unspecified HF chronicity, unspecified heart failure type ([place]) [I50.9] Patient Active Problem List   Diagnosis Date Noted   Acute HFrEF (heart failure with  reduced ejection fraction) ([place]) 10/29/2022   Pneumonia 10/29/2022   SIRS (systemic inflammatory response syndrome) ([place]) 10/29/2022   Elevated troponin 10/29/2022   Facial cellulitis 10/29/2022   Suprapubic pain 10/29/2022   Microcytic anemia 10/29/2022   CKD (chronic kidney disease), stage III ([place]) 10/29/2022   Type 2  diabetes mellitus ([place]) 10/29/2022   Heart failure ([place]) 10/29/2022   Primary osteoarthritis of right hip 02/06/2022   PCP:  Caryl Bis, MD Pharmacy:   [place], [place] [address] [place] [address] Phone: [phone] Fax: [phone]  [facility] - [place], [place] [address] [place] [address] Phone: [phone] Fax: [phone]     Social Determinants of Health (SDOH) Social History: SDOH Screenings   Tobacco Use: Low Risk  (10/30/2022)   SDOH Interventions:     Readmission Risk Interventions     No data to display

## 2022-10-30 NOTE — TOC Benefit Eligibility Note (Signed)
Patient Teacher, English as a foreign language completed.    The patient is currently admitted and upon discharge could be taking Entresto 24-26 mg.  The current 30 day co-pay is $0.00.   The patient is currently admitted and upon discharge could be taking Farxiga 10 mg.  The current 30 day co-pay is $0.00.   The patient is currently admitted and upon discharge could be taking Jardiance 10 mg.  The current 30 day co-pay is $0.00.   The patient is insured through [place] Patient Advocate Specialist [place] Patient Advocate Team Direct Number: [phone]  Fax: [phone]

## 2022-10-30 NOTE — Progress Notes (Signed)
Along with the "picking" type wounds on her bilateral arms, chest, face, and legs, RN has also discovered the same type of wounds in her scalp in multiple places under her hair at the top and sides of her head.  Pt states that she "just digs at it when it feels itchy or weird".  Pt requested tylenol for back pain.  PRN tylenol given.

## 2022-10-30 NOTE — Interval H&P Note (Signed)
History and Physical Interval Note:  10/30/2022 5:44 PM  Erica Cervantes  has presented today for surgery, with the diagnosis of acute systolic heart failure.  The various methods of treatment have been discussed with the patient and family. After consideration of risks, benefits and other options for treatment, the patient has consented to  Procedure(s): RIGHT/LEFT HEART CATH AND CORONARY ANGIOGRAPHY (N/A) and possible coronary angioplasty as a surgical intervention.  The patient's history has been reviewed, patient examined, no change in status, stable for surgery.  I have reviewed the patient's chart and labs.  Questions were answered to the patient's satisfaction.     Hamzeh Tall

## 2022-10-30 NOTE — Progress Notes (Signed)
Heart Failure Navigator Progress Note  Assessed for Heart & Vascular TOC clinic readiness.  Patient does not meet criteria due to Advanced Heart Failure Team consult. .   Navigator will sign off at this time.    Zarayah Lanting, BSN, RN Heart Failure Nurse Navigator Secure Chat Only   

## 2022-10-30 NOTE — H&P (View-Only) (Signed)
Advanced Heart Failure Team Consult Note   Primary Physician: Caryl Bis, MD PCP-Cardiologist:  None  Reason for Consultation: Acute systolic heart failure  HPI:    LATRECE NITTA is seen today for evaluation of acute systolic heart failure at the request of Dr. Broadus John, hospital medicine.   Ms. Dueitt is a 77 y.o. female with DMII, CKD, HTN, cognitive deficits, GERD, chronic pain, and fibromyalgia.  Ms. Nicholl presented to [facility] 1/9 and again 1/17 with SOB, fatigue, weakness, and decreased appetite. Chest tightness for 3 weeks with SOB. Scheduled her for an echo. Had been going on for about 3 weeks per patient report. She was trying to wait it out as she thought she had a cold. She lives at home with a roommate. Reports compliance with all medications. Usually independent and able to do all ADLs on he own. During the last 3 weeks she has been SOB while walking from her bedroom to her bathroom. Has been having to use her walker recently. Her appetite has been minimal as well d/t abdominal bloating, she only eats the food she makes because she has to cook for her dogs as well. N/V and bad diarrhea early January. Doesn't eat out, denies smoking. Occasionally will have a mixed drink or wine on the weekend.  Some family cardiac history but she doesn't recall exactly what. Sister has a fib and her son has something cardiac related for which he's on meds.   At [facility] echo significant for EF 15%. Sent to [facility] for further workup. Since being here, BNP 1377, HsTrop 23>28, CT chest and CXR with bilateral pleural effusions, Hgb A1c 7.7, Cr 1.03, resp panel (-), UA (-), and BC NGTD. Has been diuresing with IV lasix.  In bed, feels ok just slightly SOB. Denies CP.   Echo 1/17: EF 15%, LV mod-sev dilated, LA mildly dilated, RV function normal, mod pulmonary hypertension, mod MR  Home Medications Prior to Admission medications   Medication Sig Start Date End Date Taking? Authorizing Provider   amphetamine-dextroamphetamine (ADDERALL) 20 MG tablet Take 20-40 mg by mouth See admin instructions. Take 40 mg (2 tablets) by mouth in the morning and 20 mg (1 tablet) at 2 pm 10/26/17  Yes [provider]  atorvastatin (LIPITOR) 10 MG tablet Take 10 mg by mouth every Wednesday.   Yes [provider]  busPIRone (BUSPAR) 30 MG tablet Take 30 mg by mouth 3 (three) times daily. 08/23/17  Yes [provider]  Calcium Carb-Cholecalciferol (CALCIUM 500+D PO) Take 1 tablet by mouth daily.   Yes [provider]  cholecalciferol (VITAMIN D3) 25 MCG (1000 UNIT) tablet Take 2,000 Units by mouth daily.   Yes [provider]  cycloSPORINE (RESTASIS) 0.05 % ophthalmic emulsion Place 2 drops into both eyes 2 (two) times daily.   Yes [provider]  donepezil (ARICEPT) 5 MG tablet Take 5 mg by mouth at bedtime.   Yes [provider]  DULoxetine (CYMBALTA) 60 MG capsule Take 120 mg by mouth daily at 12 noon. 09/25/17  Yes [provider]  folic acid (FOLVITE) 852 MCG tablet Take 800-2,400 mcg by mouth daily.   Yes [provider]  Guaifenesin (MUCINEX MAXIMUM STRENGTH) 1200 MG TB12 Take 1,200 mg by mouth daily as needed (severe congestion).   Yes [provider]  lisinopril-hydrochlorothiazide (ZESTORETIC) 10-12.5 MG tablet Take 1 tablet by mouth daily.   Yes [provider]  meloxicam (MOBIC) 15 MG tablet Take 15 mg  by mouth daily. 07/31/22  Yes [provider]  metFORMIN (GLUCOPHAGE-XR) 500 MG 24 hr tablet Take 1,000 mg by mouth 2 (two) times daily. 09/11/17  Yes [provider]  Multiple Minerals-Vitamins (CAL MAG ZINC +D3) TABS Take 1 tablet by mouth daily.   Yes [provider]  Multiple Vitamin (MULTIVITAMIN WITH MINERALS) TABS tablet Take 1 tablet by mouth daily.   Yes [provider]  omeprazole (PRILOSEC) 20 MG capsule Take 20 mg by mouth daily.   Yes [provider]  Probiotic Product (PROBIOTIC PO) Take 1 capsule by mouth daily.   Yes [provider]  triamcinolone cream (KENALOG) 0.1 % Apply 1 application. topically 3 (three) times daily as needed for itching or rash. 12/29/21  Yes [provider]    Past Medical History: Past Medical History:  Diagnosis Date   Chronic kidney disease    stage 4   Depression    Diabetes mellitus without complication (HCC)    GERD (gastroesophageal reflux disease)    Hypertension    Neuromuscular disorder ([place])    peripheral neuropathy    Past Surgical History: Past Surgical History:  Procedure Laterality Date   ABDOMINAL HYSTERECTOMY  59   CHOLECYSTECTOMY     age 60   DILATION AND CURETTAGE OF UTERUS     x 4  1980   TONSILLECTOMY     age 61   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   Procedure: TOTAL HIP ARTHROPLASTY ANTERIOR APPROACH;  Surgeon: Dorna Leitz, MD;  Location: [facility];  Service: Orthopedics;  Laterality: Right;    Family History: Family History  Problem Relation Age of Onset   Cancer Mother    Cancer Father    Heart attack Brother    Polycystic kidney disease Brother    Cancer Other     Social History: Social History   Socioeconomic History   Marital status: Single    Spouse name: Not on file   Number of children: Not on file   Years of education: Not on file   Highest education level: Not on file  Occupational History   Not on file  Tobacco Use   Smoking status: Never   Smokeless tobacco: Never  Vaping Use   Vaping Use: Never used  Substance and Sexual Activity   Alcohol use: No   Drug use: No   Sexual activity: Not on file  Other Topics Concern   Not on file  Social History Narrative   Not on file   Social Determinants of Health   Financial Resource Strain: Not on file  Food Insecurity: Not on file  Transportation Needs: Not on file  Physical Activity: Not on file  Stress: Not on file  Social Connections: Not on file    Allergies:   Allergies  Allergen Reactions   Sulfa Antibiotics Shortness Of Breath   Ace Inhibitors Cough    Pt tolerates lisinopril    Codeine Itching and Swelling   Penicillins     Unknown reaction     Statins     Body aches    Tramadol     Numbness    Zetia [Ezetimibe]     Myalgia    Neosporin [Neomycin-Bacitracin Zn-Polymyx] Rash    Objective:    Vital Signs:   Temp:  [97.4 F (36.3 C)-98.2 F (36.8 C)] 97.7 F (36.5 C) (01/19 0724) Pulse Rate:  [97-110] 97 (01/19 0724) Resp:  [18-32] 20 (01/19 0724) BP: (122-139)/(74-99) 122/95 (01/19 0724) SpO2:  [  95 %-99 %] 97 % (01/19 0724) Weight:  [65.8 kg-67.1 kg] 67.1 kg (01/19 0039) Last BM Date : 10/29/22  Weight change: Filed Weights   10/29/22 1417 10/30/22 0039  Weight: 65.8 kg 67.1 kg    Intake/Output:   Intake/Output Summary (Last 24 hours) at 10/30/2022 1054 Last data filed at 10/30/2022 0824 Gross per 24 hour  Intake 60 ml  Output 1000 ml  Net -940 ml     Physical Exam    General:  chronically ill appearing.  Generalized scabbing and bruising all over body.  HEENT: normal Neck: supple. JVD to ear cm. Carotids 2+ bilat; no bruits. No lymphadenopathy or thyromegaly appreciated. Cor: PMI nondisplaced. Regular rate & rhythm. No rubs, gallops or murmurs. Lungs: course bases Abdomen: soft, nontender, slightly distended. No hepatosplenomegaly. No bruits or masses. Good bowel sounds. Extremities: no cyanosis, clubbing, edema. Cool extremities. Neuro: alert & oriented x 3, cranial nerves grossly intact. moves all 4 extremities w/o difficulty. Affect pleasant.   Telemetry   ST low 100s, 0-6 PVCs/min (Personally reviewed)    EKG    EKG ST 109 bpm  Labs   Basic Metabolic Panel: Recent Labs  Lab 10/29/22 1419 10/30/22 0115  NA 137 136  K 4.2 3.9  CL 102 102  CO2 23 20*  GLUCOSE 197* 203*  BUN 29* 28*  CREATININE 1.03* 1.10*  CALCIUM 9.3 9.0    Liver Function Tests: Recent Labs  Lab 10/30/22 0115  AST  36  ALT 48*  ALKPHOS 96  BILITOT 0.6  PROT 6.0*  ALBUMIN 3.2*   No results for input(s): "LIPASE", "AMYLASE" in the last 168 hours. No results for input(s): "AMMONIA" in the last 168 hours.  CBC: Recent Labs  Lab 10/29/22 1419 10/30/22 0115  WBC 11.1* 11.3*  HGB 9.4* 9.5*  HCT 31.8* 32.7*  MCV 75.0* 75.0*  PLT 453* 387    Cardiac Enzymes: No results for input(s): "CKTOTAL", "CKMB", "CKMBINDEX", "TROPONINI" in the last 168 hours.  BNP: BNP (last 3 results) Recent Labs    10/29/22 1419  BNP 1,377.3*    ProBNP (last 3 results) No results for input(s): "PROBNP" in the last 8760 hours.   CBG: Recent Labs  Lab 10/30/22 0023 10/30/22 0621 10/30/22 1052  GLUCAP 187* 166* 227*    Coagulation Studies: No results for input(s): "LABPROT", "INR" in the last 72 hours.   Imaging   CT CHEST WO CONTRAST  Result Date: 10/29/2022 CLINICAL DATA:  Shortness of breath and weakness, increasing left basilar infiltrate on recent chest x-ray EXAM: CT CHEST WITHOUT CONTRAST TECHNIQUE: Multidetector CT imaging of the chest was performed following the standard protocol without IV contrast. RADIATION DOSE REDUCTION: This exam was performed according to the departmental dose-optimization program which includes automated exposure control, adjustment of the mA and/or kV according to patient size and/or use of iterative reconstruction technique. COMPARISON:  Chest x-ray from earlier the same day. FINDINGS: Cardiovascular: Somewhat limited due to lack of IV contrast. Atherosclerotic calcifications of the aorta are noted. No cardiac enlargement is seen. Pulmonary artery is not significantly enlarged. Mediastinum/Nodes: Thoracic inlet is within normal limits. No hilar or mediastinal adenopathy is noted. The esophagus as visualized is within normal limits. Lungs/Pleura: Large bilateral pleural effusions are noted with underlying lower lobe atelectatic changes. These have worsened in the interval from  the prior exam. The remainder of the aerated lung appears within normal limits. Upper Abdomen: Visualized upper abdomen is within normal limits. Gallbladder has been surgically removed. Musculoskeletal:  Degenerative changes of the thoracic spine are noted. No acute rib abnormality is noted. IMPRESSION: Large bilateral pleural effusions with underlying lower lobe atelectatic changes. This has worsened in the interval from the prior exam particularly on the right. Aortic Atherosclerosis (ICD10-I70.0). Electronically Signed   By: Inez Catalina M.D.   On: 10/29/2022 23:21   DG Chest 2 View  Result Date: 10/29/2022 CLINICAL DATA:  Shortness of breath with weakness and nonproductive cough. EXAM: CHEST - 2 VIEW COMPARISON:  10/20/2022 FINDINGS: Low volume film. The cardio pericardial silhouette is enlarged. Left base collapse/consolidation is mildly progressive in the interval and there is a small left pleural effusion. Likely tiny right pleural effusion. No findings to suggest pulmonary edema. The visualized bony structures of the thorax are unremarkable. IMPRESSION: Interval progression of left base collapse/consolidation with small bilateral pleural effusions. Electronically Signed   By: Misty Stanley M.D.   On: 10/29/2022 15:29    ECHO [facility] 1/16:   1. The left ventricle is moderately to severely dilated in size with normal  wall thickness.    2. The left ventricular systolic function is severely decreased, LVEF is  visually estimated at 15%.    3. There is mild to moderate centrally directed mitral regurgitation.    4. The left atrium is mildly dilated in size.    5. The right ventricle is normal in size, with normal systolic function.    6. There is moderate pulmonary hypertension.    7. IVC size and inspiratory change suggest mildly elevated right atrial  pressure. (5-10 mmHg).   Medications:     Current Medications:  [START ON 11/04/2022] atorvastatin  10 mg Oral Weekly   busPIRone  10  mg Oral TID   donepezil  5 mg Oral QHS   DULoxetine  60 mg Oral Daily   folic acid  1 mg Oral Daily   furosemide  40 mg Intravenous Q12H   insulin aspart  0-5 Units Subcutaneous QHS   insulin aspart  0-9 Units Subcutaneous TID WC   mupirocin ointment   Nasal BID   potassium chloride  40 mEq Oral Once   spironolactone  25 mg Oral Daily    Infusions:   Patient Profile   Ms. Sherlin is a 77 y.o. female with DMII, CKD, HTN, cognitive deficits, GERD, chronic pain, and fibromyalgia. Presented to [facility] with SOB, cough and generalized weakness. Echo showed EF 15%. Transferred to [facility] for further evaluation. AHF team asked to see for acute systolic heart failure.   Assessment/Plan  Acute systolic heart failure - At [facility] echo showed EF 15%, LV mod-sev dilated, LA mildly dilated, RV function normal, mod pulmonary hypertension, mod MR - Etiology unknown. Possible CAD (risk factors: DM, HTN, obesity). HsTrop mildly elevated on admission 23>>28. Lipid panel, TSH, HIV ordered. NYHA IV on admission.  - Remains volume overloaded. Currently on 40 IV lasix BID, will increase to 80 IV lasix BID - Plan for [facility] once better diuresed. Suspect she may be low-output - Place PICC. May need inotropic support - Trend co-ox and CVP - Can consider Digoxin.  - Continue Spironolactone '25mg'$  daily - add SGLT2i next - avoid BB with acute exacerbation - start losartan 12.5 daily - may need cMRI - Strict I&O, daily weights  Elevated troponin - 23>>28, EKG ST 109 bpm - Denies CP, suspect demand ischemia  - LHC to r/o CAD  Bilateral pleural effusions - Seen on CT and CXR 1/18, suspect 2/2 volume overload -  Plan to repeat CXR 2-3 days, should improve with diuresis  DMII - Hgb A1c 7.7 - Continue SSI per primary - plan for eventual SGLT2i  Microcytic anemia - anemia panel tSat 12 and ferritin 12. Will do feraheme AFTER cardiac workup complete.  6. Hypertension - mildly elevated. Adding  GDMT as above   Length of Stay: [place], NP  10/30/2022, 10:54 AM  Advanced Heart Failure Team Pager [phone] (M-F; 7a - 5p)  Please contact [facility] for night-coverage after hours (4p -7a ) and weekends on [email or web address]   Patient seen and examined with the above-signed Advanced Practice Provider and/or Housestaff. I personally reviewed laboratory data, imaging studies and relevant notes. I independently examined the patient and formulated the important aspects of the plan. I have edited the note to reflect any of my changes or salient points. I have personally discussed the plan with the patient and/or family.  76y/o woman as above with DM2, HTN and cognitive delay. Admitted for several month h/o progressive HF symptoms and chest tightness  EF 15% by echo at [facility].  Trop negative   Symptoms improved with diuresis. ECG with non-specific ST abnormalities  General:  Ill appearing. No resp difficulty HEENT: normal Neck: supple. JVP to ear Carotids 2+ bilat; no bruits. No lymphadenopathy or thryomegaly appreciated. Cor: PMI nondisplaced. Regular rate & rhythm. No rubs, gallops or murmurs. Lungs: dull at bases.  Abdomen: soft, nontender, nondistended. No hepatosplenomegaly. No bruits or masses. Good bowel sounds. Extremities: no cyanosis, clubbing, rash, 1+ edema Neuro: alert & orientedx3, cranial nerves grossly intact. moves all 4 extremities w/o difficulty. Affect pleasant  Patient with acute HF with severely reduced EF. Etiology unclear. Multiple CRFs. Will plan R/L cath today. Continue diuresis. Titrate GDMT as tolerated. If no significant CAD -> consider cMRI  Glori Bickers, MD  5:43 PM

## 2022-10-30 NOTE — Consult Note (Signed)
   [facility] Inpatient Consult   10/30/2022  MORRISA ALDABA 09-Aug-1946 [id number]  [place] [facility] [ACO] Patient: [employer]  Primary Care Provider:  Caryl Bis, MD with [facility]  Patient screened for hospitalization with  Review of patient's [place]/PING reveals patient has care coordination in the HTA CSNP plan.   Plan: No [facility] Care Coordination assess as patient is showing as managed by the HTA CSNP team. Will sign off   For questions contact:   Natividad Brood, RN BSN [place]  [phone] business mobile phone Toll free office [phone]  *[place]  [phone] Fax number: [phone] [email or web address]

## 2022-10-30 NOTE — Progress Notes (Signed)
Rounding Note    Patient Name: Erica Cervantes Date of Encounter: 10/30/2022  [place] Cardiologist: None   Subjective   Came in with shortness of breath, EF 15% on recent echo.  [facility].  Easily arousable but fell asleep at times during discussion.  Inpatient Medications    Scheduled Meds:  insulin aspart  0-5 Units Subcutaneous QHS   insulin aspart  0-9 Units Subcutaneous TID WC   Continuous Infusions:  azithromycin     cefTRIAXone (ROCEPHIN)  IV     PRN Meds: acetaminophen **OR** acetaminophen   Vital Signs    Vitals:   10/30/22 0039 10/30/22 0422 10/30/22 0425 10/30/22 0724  BP: 123/82 (!) 135/99 (!) 130/90 (!) 122/95  Pulse: (!) 104  100 97  Resp: 19 (!) '22 20 20  '$ Temp: 97.8 F (36.6 C) (!) 97.4 F (36.3 C) 97.9 F (36.6 C) 97.7 F (36.5 C)  TempSrc: Oral Oral  Oral  SpO2: 98% 97%  97%  Weight: 67.1 kg     Height: 5' 3.5" (1.613 m)       Intake/Output Summary (Last 24 hours) at 10/30/2022 0844 Last data filed at 10/30/2022 0824 Gross per 24 hour  Intake 60 ml  Output 1000 ml  Net -940 ml      10/30/2022   12:39 AM 10/29/2022    2:17 PM 03/04/2022    9:14 AM  Last 3 Weights  Weight (lbs) 147 lb 14.9 oz 145 lb 1 oz 145 lb  Weight (kg) 67.1 kg 65.8 kg 65.772 kg      Telemetry    PVC rare sinus tach - Personally Reviewed  ECG    Sinus tach, poor R wave progression, nonspecific ST-T wave changes- Personally Reviewed  Physical Exam   GEN: No acute distress.  Moderately ill appearing Neck: Positive mid neck JVD Cardiac: RRR, no murmurs, rubs, or gallops.  Respiratory: Clear to auscultation bilaterally. GI: Soft, nontender, non-distended  MS: No edema; No deformity. Erythema inside of left antecubital fossa Neuro:  Nonfocal  Psych: Normal affect   Labs    High Sensitivity Troponin:   Recent Labs  Lab 10/29/22 1419 10/29/22 1651  TROPONINIHS 23* 28*     Chemistry Recent Labs  Lab 10/29/22 1419 10/30/22 0115  NA  137 136  K 4.2 3.9  CL 102 102  CO2 23 20*  GLUCOSE 197* 203*  BUN 29* 28*  CREATININE 1.03* 1.10*  CALCIUM 9.3 9.0  PROT  --  6.0*  ALBUMIN  --  3.2*  AST  --  36  ALT  --  48*  ALKPHOS  --  96  BILITOT  --  0.6  GFRNONAA 56* 51*  ANIONGAP 12 14    Lipids No results for input(s): "CHOL", "TRIG", "HDL", "LABVLDL", "LDLCALC", "CHOLHDL" in the last 168 hours.  Hematology Recent Labs  Lab 10/29/22 1419 10/30/22 0115  WBC 11.1* 11.3*  RBC 4.24 4.36  4.35  HGB 9.4* 9.5*  HCT 31.8* 32.7*  MCV 75.0* 75.0*  MCH 22.2* 21.8*  MCHC 29.6* 29.1*  RDW 17.7* 17.6*  PLT 453* 387   Thyroid No results for input(s): "TSH", "FREET4" in the last 168 hours.  BNP Recent Labs  Lab 10/29/22 1419  BNP 1,377.3*    DDimer No results for input(s): "DDIMER" in the last 168 hours.   Radiology    CT CHEST WO CONTRAST  Result Date: 10/29/2022 CLINICAL DATA:  Shortness of breath and weakness, increasing left basilar infiltrate  on recent chest x-ray EXAM: CT CHEST WITHOUT CONTRAST TECHNIQUE: Multidetector CT imaging of the chest was performed following the standard protocol without IV contrast. RADIATION DOSE REDUCTION: This exam was performed according to the departmental dose-optimization program which includes automated exposure control, adjustment of the mA and/or kV according to patient size and/or use of iterative reconstruction technique. COMPARISON:  Chest x-ray from earlier the same day. FINDINGS: Cardiovascular: Somewhat limited due to lack of IV contrast. Atherosclerotic calcifications of the aorta are noted. No cardiac enlargement is seen. Pulmonary artery is not significantly enlarged. Mediastinum/Nodes: Thoracic inlet is within normal limits. No hilar or mediastinal adenopathy is noted. The esophagus as visualized is within normal limits. Lungs/Pleura: Large bilateral pleural effusions are noted with underlying lower lobe atelectatic changes. These have worsened in the interval from the  prior exam. The remainder of the aerated lung appears within normal limits. Upper Abdomen: Visualized upper abdomen is within normal limits. Gallbladder has been surgically removed. Musculoskeletal: Degenerative changes of the thoracic spine are noted. No acute rib abnormality is noted. IMPRESSION: Large bilateral pleural effusions with underlying lower lobe atelectatic changes. This has worsened in the interval from the prior exam particularly on the right. Aortic Atherosclerosis (ICD10-I70.0). Electronically Signed   By: Inez Catalina M.D.   On: 10/29/2022 23:21   DG Chest 2 View  Result Date: 10/29/2022 CLINICAL DATA:  Shortness of breath with weakness and nonproductive cough. EXAM: CHEST - 2 VIEW COMPARISON:  10/20/2022 FINDINGS: Low volume film. The cardio pericardial silhouette is enlarged. Left base collapse/consolidation is mildly progressive in the interval and there is a small left pleural effusion. Likely tiny right pleural effusion. No findings to suggest pulmonary edema. The visualized bony structures of the thorax are unremarkable. IMPRESSION: Interval progression of left base collapse/consolidation with small bilateral pleural effusions. Electronically Signed   By: Misty Stanley M.D.   On: 10/29/2022 15:29    Cardiac Studies   TTE (10/28/22; performed at [facility])   Summary  1. The left ventricle is moderately to severely dilated in size with normal wall thickness.  2. The left ventricular systolic function is severely decreased, LVEF is visually estimated at 15%.  3. There is mild to moderate centrally directed mitral regurgitation.  4. The left atrium is mildly dilated in size.  5. The right ventricle is normal in size, with normal systolic function.  6. There is moderate pulmonary hypertension.  7. IVC size and inspiratory change suggest mildly elevated right atrial pressure. (5-10 mmHg).     Patient Profile     77 y.o. female with acute on chronic systolic heart  failure EF 15%  Assessment & Plan    Acute on chronic systolic heart failure - Elevated BNP 1377, chest x-ray bilateral pleural effusions, elevated JVD sinus tachycardia. -Received yesterday IV Lasix 40 mg, diuresed 1 L yesterday.  Blood pressure 122/95. -Currently n.p.o. for further evaluation, i.e. heart catheterization -We have reached out to advanced heart failure team.  They will be seeing her and performing diagnostic studies and initiation of goal-directed medical therapy.  Elevated troponin - Mildly elevated in the 20s, myocardial injury in the setting of acute heart failure.   Discussed with primary team.  For questions or updates, please contact [place] Please consult [email or web address] for contact info under        Signed, Candee Furbish, MD  10/30/2022, 8:44 AM

## 2022-10-30 NOTE — Progress Notes (Addendum)
PROGRESS NOTE    Erica Cervantes  MHD:[id number] DOB: 28-Jun-1946 DOA: 10/29/2022 PCP: Caryl Bis, MD  77/F with history of type 2 diabetes mellitus, hypertension, CKD3a, cognitive deficits, depression, GERD, chronic pain, fibromyalgia presented to [facility] [place] [facility] with progressive shortness of breath, cough and generalized weakness with significant decline in functional status over the last few months.  Has been laying in bed for the last few months on account of her breathing and over this time developed itching, skin rash, keeps picking at her arms, face etc.  -Chest x-ray with left base collapse,/consolidation with small bilateral pleural effusions, labs with WBC of 11.1, creatinine 1.0, troponin 23, BNP 1377 -Echo at [facility] with EF 15%, moderate MR, preserved RV, moderate pulmonary hypertension, CT chest with large bilateral effusions -Transferred to [facility]   Subjective: -Tired  Assessment and Plan:  Acute systolic CHF Mild to moderate MR -With large bilateral effusions -New diagnosis -Will request CHF team input, continue IV Lasix today, will need additional workup including left/right heart cath -Continue IV Lasix today -Will add Aldactone, may not be a great candidate for SGLT2i  -Monitor I's/O, daily weights, BMP in a.m.  Bilateral pleural effusions -With compressive atelectasis, CT chest without clear evidence of consolidation, discontinue antibiotics, repeat CXR in 2-3days  Multiple skin lesions, picking, history of abscesses -? MRSA, poor personal hygiene -Has been on doxycycline for few months, will discuss with infection prevention -Nasal MRSA PCR positive, add bactroban  Microcytic anemia -Anemia panel suggestive of iron deficiency, hold off on IV iron at this time in case additional cardiac workup warranted  Cognitive deficits -awake alert oriented x 3 and appropriate -She is on Aricept at baseline, this is continued  Type 2 diabetes mellitus -Continue  sliding scale insulin for now, A1c 7.7  DVT prophylaxis: add lovenox Code Status: Full Code Family Communication: none present Disposition Plan: may need placement  Consultants: CHF team   Procedures:   Antimicrobials:    Objective: Vitals:   10/30/22 0039 10/30/22 0422 10/30/22 0425 10/30/22 0724  BP: 123/82 (!) 135/99 (!) 130/90 (!) 122/95  Pulse: (!) 104  100 97  Resp: 19 (!) '22 20 20  '$ Temp: 97.8 F (36.6 C) (!) 97.4 F (36.3 C) 97.9 F (36.6 C) 97.7 F (36.5 C)  TempSrc: Oral Oral  Oral  SpO2: 98% 97%  97%  Weight: 67.1 kg     Height: 5' 3.5" (1.613 m)       Intake/Output Summary (Last 24 hours) at 10/30/2022 1002 Last data filed at 10/30/2022 2119 Gross per 24 hour  Intake 60 ml  Output 1000 ml  Net -940 ml   Filed Weights   10/29/22 1417 10/30/22 0039  Weight: 65.8 kg 67.1 kg    Examination:  General exam: Pleasant chronically ill female laying in bed, awake alert oriented to self place and partly to time HEENT: Positive JVD CVS: S1-S2, regular rhythm Lungs: Decreased breath sounds to bases Abdomen: Soft, nontender, bowel sounds present Extremities: No edema  Skin: No rashes Psychiatry:  Mood & affect appropriate.     Data Reviewed:   CBC: Recent Labs  Lab 10/29/22 1419 10/30/22 0115  WBC 11.1* 11.3*  HGB 9.4* 9.5*  HCT 31.8* 32.7*  MCV 75.0* 75.0*  PLT 453* 417   Basic Metabolic Panel: Recent Labs  Lab 10/29/22 1419 10/30/22 0115  NA 137 136  K 4.2 3.9  CL 102 102  CO2 23 20*  GLUCOSE 197* 203*  BUN 29* 28*  CREATININE 1.03* 1.10*  CALCIUM 9.3 9.0   GFR: Estimated Creatinine Clearance: 39.3 mL/min (A) (by C-G formula based on SCr of 1.1 mg/dL (H)). Liver Function Tests: Recent Labs  Lab 10/30/22 0115  AST 36  ALT 48*  ALKPHOS 96  BILITOT 0.6  PROT 6.0*  ALBUMIN 3.2*   No results for input(s): "LIPASE", "AMYLASE" in the last 168 hours. No results for input(s): "AMMONIA" in the last 168 hours. Coagulation  Profile: No results for input(s): "INR", "PROTIME" in the last 168 hours. Cardiac Enzymes: No results for input(s): "CKTOTAL", "CKMB", "CKMBINDEX", "TROPONINI" in the last 168 hours. BNP (last 3 results) No results for input(s): "PROBNP" in the last 8760 hours. HbA1C: Recent Labs    10/30/22 0115  HGBA1C 7.7*   CBG: Recent Labs  Lab 10/30/22 0023 10/30/22 0621  GLUCAP 187* 166*   Lipid Profile: No results for input(s): "CHOL", "HDL", "LDLCALC", "TRIG", "CHOLHDL", "LDLDIRECT" in the last 72 hours. Thyroid Function Tests: No results for input(s): "TSH", "T4TOTAL", "FREET4", "T3FREE", "THYROIDAB" in the last 72 hours. Anemia Panel: Recent Labs    10/30/22 0115 10/30/22 0417  VITAMINB12 297  --   FOLATE  --  >40.0  FERRITIN 12  --   TIBC 378  --   IRON 44  --   RETICCTPCT 1.7  --    Urine analysis:    Component Value Date/Time   COLORURINE YELLOW 10/29/2022 2230   APPEARANCEUR CLEAR 10/29/2022 2230   LABSPEC 1.012 10/29/2022 2230   PHURINE 5.0 10/29/2022 2230   GLUCOSEU NEGATIVE 10/29/2022 2230   HGBUR NEGATIVE 10/29/2022 2230   BILIRUBINUR NEGATIVE 10/29/2022 2230   KETONESUR NEGATIVE 10/29/2022 2230   PROTEINUR 30 (A) 10/29/2022 2230   NITRITE NEGATIVE 10/29/2022 2230   LEUKOCYTESUR SMALL (A) 10/29/2022 2230   Sepsis Labs: '@LABRCNTIP'$ (procalcitonin:4,lacticidven:4)  ) Recent Results (from the past 240 hour(s))  Culture, blood (Routine X 2) w Reflex to ID Panel     Status: None (Preliminary result)   Collection Time: 10/29/22  9:45 PM   Specimen: BLOOD  Result Value Ref Range Status   Specimen Description BLOOD LEFT ANTECUBITAL  Final   Special Requests   Final    BOTTLES DRAWN AEROBIC AND ANAEROBIC Blood Culture results may not be optimal due to an excessive volume of blood received in culture bottles   Culture   Final    NO GROWTH < 12 HOURS Performed at [facility] [address] [place], [place] [address]    Report Status PENDING  Incomplete   Culture, blood (Routine X 2) w Reflex to ID Panel     Status: None (Preliminary result)   Collection Time: 10/29/22  9:45 PM   Specimen: BLOOD  Result Value Ref Range Status   Specimen Description BLOOD LEFT ANTECUBITAL  Final   Special Requests   Final    BOTTLES DRAWN AEROBIC AND ANAEROBIC Blood Culture adequate volume   Culture   Final    NO GROWTH < 12 HOURS Performed at [facility], [place] [address] [place], [place] [address]    Report Status PENDING  Incomplete  Resp panel by RT-PCR (RSV, Flu A&B, Covid) Anterior Nasal Swab     Status: None   Collection Time: 10/30/22  1:40 AM   Specimen: Anterior Nasal Swab  Result Value Ref Range Status   SARS Coronavirus 2 by RT PCR NEGATIVE NEGATIVE Final    Comment: (NOTE) SARS-CoV-2 target nucleic acids are NOT DETECTED.  The SARS-CoV-2 RNA  is generally detectable in upper respiratory specimens during the acute phase of infection. The lowest concentration of SARS-CoV-2 viral copies this assay can detect is 138 copies/mL. A negative result does not preclude SARS-Cov-2 infection and should not be used as the sole basis for treatment or other patient management decisions. A negative result may occur with  improper specimen collection/handling, submission of specimen other than nasopharyngeal swab, presence of viral mutation(s) within the areas targeted by this assay, and inadequate number of viral copies(<138 copies/mL). A negative result must be combined with clinical observations, patient history, and epidemiological information. The expected result is Negative.  Fact Sheet for Patients:  EntrepreneurPulse.com.au  Fact Sheet for Healthcare Providers:  IncredibleEmployment.be  This test is no t yet approved or cleared by the [place] FDA and  has been authorized for detection and/or diagnosis of SARS-CoV-2 by FDA under an Emergency Use Authorization (EUA). This EUA will remain  in  effect (meaning this test can be used) for the duration of the COVID-19 declaration under Section 564(b)(1) of the Act, 21 U.S.C.section 360bbb-3(b)(1), unless the authorization is terminated  or revoked sooner.       Influenza A by PCR NEGATIVE NEGATIVE Final   Influenza B by PCR NEGATIVE NEGATIVE Final    Comment: (NOTE) The Xpert Xpress SARS-CoV-2/FLU/RSV plus assay is intended as an aid in the diagnosis of influenza from Nasopharyngeal swab specimens and should not be used as a sole basis for treatment. Nasal washings and aspirates are unacceptable for Xpert Xpress SARS-CoV-2/FLU/RSV testing.  Fact Sheet for Patients: EntrepreneurPulse.com.au  Fact Sheet for Healthcare Providers: IncredibleEmployment.be  This test is not yet approved or cleared by the [place] FDA and has been authorized for detection and/or diagnosis of SARS-CoV-2 by FDA under an Emergency Use Authorization (EUA). This EUA will remain in effect (meaning this test can be used) for the duration of the COVID-19 declaration under Section 564(b)(1) of the Act, 21 U.S.C. section 360bbb-3(b)(1), unless the authorization is terminated or revoked.     Resp Syncytial Virus by PCR NEGATIVE NEGATIVE Final    Comment: (NOTE) Fact Sheet for Patients: EntrepreneurPulse.com.au  Fact Sheet for Healthcare Providers: IncredibleEmployment.be  This test is not yet approved or cleared by the [place] FDA and has been authorized for detection and/or diagnosis of SARS-CoV-2 by FDA under an Emergency Use Authorization (EUA). This EUA will remain in effect (meaning this test can be used) for the duration of the COVID-19 declaration under Section 564(b)(1) of the Act, 21 U.S.C. section 360bbb-3(b)(1), unless the authorization is terminated or revoked.  Performed at [facility], [place] [address], [place], [place] [address]   MRSA Next Gen  by PCR, Nasal     Status: Abnormal   Collection Time: 10/30/22  2:37 AM   Specimen: Nasal Mucosa; Nasal Swab  Result Value Ref Range Status   MRSA by PCR Next Gen DETECTED (A) NOT DETECTED Final    Comment: RESULT CALLED TO, READ BACK BY AND VERIFIED WITH: C KETROM,RN'@[id number]'$  10/30/22 [place] (NOTE) The GeneXpert MRSA Assay (FDA approved for NASAL specimens only), is one component of a comprehensive MRSA colonization surveillance program. It is not intended to diagnose MRSA infection nor to guide or monitor treatment for MRSA infections. Test performance is not FDA approved in patients less than 54 years old. Performed at [facility], [place] [address], [place], [place] [address]      Radiology Studies: CT CHEST WO CONTRAST  Result Date: 10/29/2022 CLINICAL DATA:  Shortness of breath and weakness, increasing left basilar infiltrate on recent chest x-ray EXAM: CT CHEST WITHOUT CONTRAST TECHNIQUE: Multidetector CT imaging of the chest was performed following the standard protocol without IV contrast. RADIATION DOSE REDUCTION: This exam was performed according to the departmental dose-optimization program which includes automated exposure control, adjustment of the mA and/or kV according to patient size and/or use of iterative reconstruction technique. COMPARISON:  Chest x-ray from earlier the same day. FINDINGS: Cardiovascular: Somewhat limited due to lack of IV contrast. Atherosclerotic calcifications of the aorta are noted. No cardiac enlargement is seen. Pulmonary artery is not significantly enlarged. Mediastinum/Nodes: Thoracic inlet is within normal limits. No hilar or mediastinal adenopathy is noted. The esophagus as visualized is within normal limits. Lungs/Pleura: Large bilateral pleural effusions are noted with underlying lower lobe atelectatic changes. These have worsened in the interval from the prior exam. The remainder of the aerated lung appears within normal limits. Upper Abdomen:  Visualized upper abdomen is within normal limits. Gallbladder has been surgically removed. Musculoskeletal: Degenerative changes of the thoracic spine are noted. No acute rib abnormality is noted. IMPRESSION: Large bilateral pleural effusions with underlying lower lobe atelectatic changes. This has worsened in the interval from the prior exam particularly on the right. Aortic Atherosclerosis (ICD10-I70.0). Electronically Signed   By: Inez Catalina M.D.   On: 10/29/2022 23:21   DG Chest 2 View  Result Date: 10/29/2022 CLINICAL DATA:  Shortness of breath with weakness and nonproductive cough. EXAM: CHEST - 2 VIEW COMPARISON:  10/20/2022 FINDINGS: Low volume film. The cardio pericardial silhouette is enlarged. Left base collapse/consolidation is mildly progressive in the interval and there is a small left pleural effusion. Likely tiny right pleural effusion. No findings to suggest pulmonary edema. The visualized bony structures of the thorax are unremarkable. IMPRESSION: Interval progression of left base collapse/consolidation with small bilateral pleural effusions. Electronically Signed   By: Misty Stanley M.D.   On: 10/29/2022 15:29     Scheduled Meds:  insulin aspart  0-5 Units Subcutaneous QHS   insulin aspart  0-9 Units Subcutaneous TID WC   Continuous Infusions:  azithromycin     cefTRIAXone (ROCEPHIN)  IV       LOS: 1 day    Time spent: 15mn    PDomenic Polite MD [facility]   10/30/2022, 10:02 AM

## 2022-10-31 DIAGNOSIS — I5021 Acute systolic (congestive) heart failure: Secondary | ICD-10-CM | POA: Diagnosis not present

## 2022-10-31 LAB — LIPID PANEL
Cholesterol: 98 mg/dL (ref 0–200)
HDL: 42 mg/dL (ref >40)
LDL Cholesterol: 39 mg/dL (ref 0–99)
Total CHOL/HDL Ratio: 2.3 RATIO
Triglycerides: 87 mg/dL (ref <150)
VLDL: 17 mg/dL (ref 0–40)

## 2022-10-31 LAB — BASIC METABOLIC PANEL
Anion gap: 12 (ref 5–15)
BUN: 28 mg/dL — ABNORMAL HIGH (ref 8–23)
CO2: 27 mmol/L (ref 22–32)
Calcium: 9.3 mg/dL (ref 8.9–10.3)
Chloride: 100 mmol/L (ref 98–111)
Creatinine, Ser: 1.3 mg/dL — ABNORMAL HIGH (ref 0.44–1.00)
GFR, Estimated: 42 mL/min — ABNORMAL LOW (ref >60)
Glucose, Bld: 113 mg/dL — ABNORMAL HIGH (ref 70–99)
Potassium: 3.5 mmol/L (ref 3.5–5.1)
Sodium: 139 mmol/L (ref 135–145)

## 2022-10-31 LAB — CBC
HCT: 29.9 % — ABNORMAL LOW (ref 36.0–46.0)
Hemoglobin: 8.7 g/dL — ABNORMAL LOW (ref 12.0–15.0)
MCH: 22 pg — ABNORMAL LOW (ref 26.0–34.0)
MCHC: 29.1 g/dL — ABNORMAL LOW (ref 30.0–36.0)
MCV: 75.5 fL — ABNORMAL LOW (ref 80.0–100.0)
Platelets: 383 10*3/uL (ref 150–400)
RBC: 3.96 MIL/uL (ref 3.87–5.11)
RDW: 17.4 % — ABNORMAL HIGH (ref 11.5–15.5)
WBC: 12 10*3/uL — ABNORMAL HIGH (ref 4.0–10.5)
nRBC: 0 % (ref 0.0–0.2)

## 2022-10-31 LAB — MAGNESIUM: Magnesium: 1.8 mg/dL (ref 1.7–2.4)

## 2022-10-31 LAB — COOXEMETRY PANEL
Carboxyhemoglobin: 1.7 % — ABNORMAL HIGH (ref 0.5–1.5)
Methemoglobin: <0.7 % (ref 0.0–1.5)
O2 Saturation: 57 %
Total hemoglobin: 8.8 g/dL — ABNORMAL LOW (ref 12.0–16.0)

## 2022-10-31 LAB — GLUCOSE, CAPILLARY
Glucose-Capillary: 106 mg/dL — ABNORMAL HIGH (ref 70–99)
Glucose-Capillary: 176 mg/dL — ABNORMAL HIGH (ref 70–99)
Glucose-Capillary: 207 mg/dL — ABNORMAL HIGH (ref 70–99)
Glucose-Capillary: 165 mg/dL — ABNORMAL HIGH (ref 70–99)

## 2022-10-31 LAB — TSH: TSH: 2.794 u[IU]/mL (ref 0.350–4.500)

## 2022-10-31 LAB — HIV ANTIBODY (ROUTINE TESTING W REFLEX): HIV Screen 4th Generation wRfx: NONREACTIVE

## 2022-10-31 MED ORDER — POTASSIUM CHLORIDE CRYS ER 20 MEQ PO TBCR
40.0000 meq | EXTENDED_RELEASE_TABLET | Freq: Two times a day (BID) | ORAL | Status: AC
  Administered 2022-10-31 (×2): 40 meq via ORAL
  Filled 2022-10-31 (×2): qty 2

## 2022-10-31 MED ORDER — LOSARTAN POTASSIUM 25 MG PO TABS
12.5000 mg | ORAL_TABLET | Freq: Every day | ORAL | Status: DC
  Administered 2022-10-31 – 2022-11-01 (×2): 12.5 mg via ORAL
  Filled 2022-10-31 (×2): qty 1

## 2022-10-31 MED ORDER — MAGNESIUM SULFATE 2 GM/50ML IV SOLN
2.0000 g | Freq: Once | INTRAVENOUS | Status: AC
  Administered 2022-10-31: 2 g via INTRAVENOUS
  Filled 2022-10-31: qty 50

## 2022-10-31 MED ORDER — DIGOXIN 125 MCG PO TABS
0.0625 mg | ORAL_TABLET | Freq: Every day | ORAL | Status: DC
  Administered 2022-10-31 – 2022-11-06 (×7): 0.0625 mg via ORAL
  Filled 2022-10-31 (×7): qty 1

## 2022-10-31 NOTE — Progress Notes (Signed)
PROGRESS NOTE    Erica Cervantes  YQI:[id number] DOB: 1945/11/14 DOA: 10/29/2022 PCP: Caryl Bis, MD  77/F with history of type 2 diabetes mellitus, hypertension, CKD3a, cognitive deficits, depression, GERD, chronic pain, fibromyalgia presented to [facility] ER with progressive shortness of breath, cough and generalized weakness with significant decline in functional status over the last few months.  Has been laying in bed for the last few months on account of her breathing and over this time developed itching, skin rash, keeps picking at her arms, face etc.  -Chest x-ray with left base collapse,/consolidation with small bilateral pleural effusions, labs with WBC of 11.1, creatinine 1.0, troponin 23, BNP 1377 -Echo at [facility] with EF 15%, moderate MR, preserved RV, moderate pulmonary hypertension, CT chest with large bilateral effusions -Transferred to [facility] -Cardiology consulting, right and left heart cath with severe BiV failure, low output, wedge of 23, minimal nonobstructive CAD -1/19: Started on milrinone and transferred to 2 heart  Subjective: -Feels much better overall today, breathing is improving  Assessment and Plan:  Acute systolic CHF Mild to moderate MR Large bilateral effusions -New diagnosis, cath with minimal nonobstructive CAD, etiology of cardiomyopathy is unknown -RHC with low output, severe BiV failure, started on milrinone 1/19 -Continue IV Lasix, Aldactone, she is 2.5 L negative, creatinine stable at 1.3 -Repeat chest x-ray in 48 hours -Not a great candidate for SGLT2i  Multiple skin lesions, picking, history of abscesses -? MRSA folliculitis, poor personal hygiene -Has been on doxycycline for few months, monitor skin lesions -Nasal MRSA PCR positive, add bactroban  Microcytic anemia -Anemia panel suggestive of iron deficiency, hold off on IV iron at this time in case additional cardiac workup warranted  Cognitive deficits -awake alert oriented x 3  and appropriate -She is on Aricept at baseline, this is continued  Type 2 diabetes mellitus -Continue sliding scale insulin for now, A1c 7.7  DVT prophylaxis: lovenox Code Status: Full Code Family Communication: none present, will update daughter Disposition Plan: may need placement  Consultants: CHF team   Procedures:   Antimicrobials:    Objective: Vitals:   10/31/22 0700 10/31/22 0800 10/31/22 0900 10/31/22 1000  BP: 124/84 125/77 107/73 118/65  Pulse: 98 (!) 107 (!) 102 (!) 106  Resp: 17 (!) 22 (!) 26 (!) 22  Temp: (!) 97.2 F (36.2 C) (!) 97.3 F (36.3 C) (!) 101.3 F (38.5 C) 97.7 F (36.5 C)  TempSrc:  Core    SpO2: 98% 93% 94% 97%  Weight:      Height:        Intake/Output Summary (Last 24 hours) at 10/31/2022 1020 Last data filed at 10/31/2022 1000 Gross per 24 hour  Intake 387.94 ml  Output 2650 ml  Net -2262.06 ml   Filed Weights   10/29/22 1417 10/30/22 0039 10/31/22 0500  Weight: 65.8 kg 67.1 kg 66.4 kg    Examination:  General exam: Pleasant elderly chronically ill female laying in bed, AAOx3, no distress, multiple areas of erythema and excoriations on her face HEENT: Positive JVD CVS: S1-S2, regular rhythm Lungs: Decreased breath sounds at both bases Abdomen: Soft, nontender, bowel sounds present Extremities: No edema  Skin: No rashes Psychiatry:  Mood & affect appropriate.     Data Reviewed:   CBC: Recent Labs  Lab 10/29/22 1419 10/30/22 0115 10/30/22 1755 10/30/22 1759 10/30/22 1809 10/30/22 2006 10/31/22 0423  WBC 11.1* 11.3*  --   --   --  10.1 12.0*  HGB 9.4* 9.5* 9.5*  10.2*  8.8* 10.5* 8.7* 8.7*  HCT 31.8* 32.7* 28.0* 30.0*  26.0* 31.0* 27.8* 29.9*  MCV 75.0* 75.0*  --   --   --  73.0* 75.5*  PLT 453* 387  --   --   --  399 903   Basic Metabolic Panel: Recent Labs  Lab 10/29/22 1419 10/30/22 0115 10/30/22 1755 10/30/22 1759 10/30/22 1809 10/30/22 2006 10/31/22 0423  NA 137 136 140 137  145 137  --  139  K  4.2 3.9 4.6 5.0  4.1 5.0  --  3.5  CL 102 102  --   --   --   --  100  CO2 23 20*  --   --   --   --  27  GLUCOSE 197* 203*  --   --   --   --  113*  BUN 29* 28*  --   --   --   --  28*  CREATININE 1.03* 1.10*  --   --   --  1.37* 1.30*  CALCIUM 9.3 9.0  --   --   --   --  9.3  MG  --   --   --   --   --   --  1.8   GFR: Estimated Creatinine Clearance: 33.1 mL/min (A) (by C-G formula based on SCr of 1.3 mg/dL (H)). Liver Function Tests: Recent Labs  Lab 10/30/22 0115  AST 36  ALT 48*  ALKPHOS 96  BILITOT 0.6  PROT 6.0*  ALBUMIN 3.2*   No results for input(s): "LIPASE", "AMYLASE" in the last 168 hours. No results for input(s): "AMMONIA" in the last 168 hours. Coagulation Profile: No results for input(s): "INR", "PROTIME" in the last 168 hours. Cardiac Enzymes: No results for input(s): "CKTOTAL", "CKMB", "CKMBINDEX", "TROPONINI" in the last 168 hours. BNP (last 3 results) No results for input(s): "PROBNP" in the last 8760 hours. HbA1C: Recent Labs    10/30/22 0115  HGBA1C 7.7*   CBG: Recent Labs  Lab 10/30/22 0621 10/30/22 1052 10/30/22 1621 10/30/22 2125 10/31/22 0609  GLUCAP 166* 227* 211* 142* 106*   Lipid Profile: Recent Labs    10/31/22 0423  CHOL 98  HDL 42  LDLCALC 39  TRIG 87  CHOLHDL 2.3   Thyroid Function Tests: Recent Labs    10/31/22 0423  TSH 2.794   Anemia Panel: Recent Labs    10/30/22 0115 10/30/22 0417  VITAMINB12 297  --   FOLATE  --  >40.0  FERRITIN 12  --   TIBC 378  --   IRON 44  --   RETICCTPCT 1.7  --    Urine analysis:    Component Value Date/Time   COLORURINE YELLOW 10/29/2022 2230   APPEARANCEUR CLEAR 10/29/2022 2230   LABSPEC 1.012 10/29/2022 2230   PHURINE 5.0 10/29/2022 2230   GLUCOSEU NEGATIVE 10/29/2022 2230   HGBUR NEGATIVE 10/29/2022 2230   BILIRUBINUR NEGATIVE 10/29/2022 2230   KETONESUR NEGATIVE 10/29/2022 2230   PROTEINUR 30 (A) 10/29/2022 2230   NITRITE NEGATIVE 10/29/2022 2230   LEUKOCYTESUR  SMALL (A) 10/29/2022 2230   Sepsis Labs: '@LABRCNTIP'$ (procalcitonin:4,lacticidven:4)  ) Recent Results (from the past 240 hour(s))  Culture, blood (Routine X 2) w Reflex to ID Panel     Status: None (Preliminary result)   Collection Time: 10/29/22  9:45 PM   Specimen: BLOOD  Result Value Ref Range Status   Specimen Description BLOOD LEFT ANTECUBITAL  Final   Special Requests   Final  BOTTLES DRAWN AEROBIC AND ANAEROBIC Blood Culture results may not be optimal due to an excessive volume of blood received in culture bottles   Culture   Final    NO GROWTH 2 DAYS Performed at [facility] [address], [place], [place] [address]    Report Status PENDING  Incomplete  Culture, blood (Routine X 2) w Reflex to ID Panel     Status: None (Preliminary result)   Collection Time: 10/29/22  9:45 PM   Specimen: BLOOD  Result Value Ref Range Status   Specimen Description BLOOD LEFT ANTECUBITAL  Final   Special Requests   Final    BOTTLES DRAWN AEROBIC AND ANAEROBIC Blood Culture adequate volume   Culture   Final    NO GROWTH 2 DAYS Performed at [facility], [place] [address], [place], [place] [address]    Report Status PENDING  Incomplete  Resp panel by RT-PCR (RSV, Flu A&B, Covid) Anterior Nasal Swab     Status: None   Collection Time: 10/30/22  1:40 AM   Specimen: Anterior Nasal Swab  Result Value Ref Range Status   SARS Coronavirus 2 by RT PCR NEGATIVE NEGATIVE Final    Comment: (NOTE) SARS-CoV-2 target nucleic acids are NOT DETECTED.  The SARS-CoV-2 RNA is generally detectable in upper respiratory specimens during the acute phase of infection. The lowest concentration of SARS-CoV-2 viral copies this assay can detect is 138 copies/mL. A negative result does not preclude SARS-Cov-2 infection and should not be used as the sole basis for treatment or other patient management decisions. A negative result may occur with  improper specimen collection/handling, submission of  specimen other than nasopharyngeal swab, presence of viral mutation(s) within the areas targeted by this assay, and inadequate number of viral copies(<138 copies/mL). A negative result must be combined with clinical observations, patient history, and epidemiological information. The expected result is Negative.  Fact Sheet for Patients:  EntrepreneurPulse.com.au  Fact Sheet for Healthcare Providers:  IncredibleEmployment.be  This test is no t yet approved or cleared by the [place] FDA and  has been authorized for detection and/or diagnosis of SARS-CoV-2 by FDA under an Emergency Use Authorization (EUA). This EUA will remain  in effect (meaning this test can be used) for the duration of the COVID-19 declaration under Section 564(b)(1) of the Act, 21 U.S.C.section 360bbb-3(b)(1), unless the authorization is terminated  or revoked sooner.       Influenza A by PCR NEGATIVE NEGATIVE Final   Influenza B by PCR NEGATIVE NEGATIVE Final    Comment: (NOTE) The Xpert Xpress SARS-CoV-2/FLU/RSV plus assay is intended as an aid in the diagnosis of influenza from Nasopharyngeal swab specimens and should not be used as a sole basis for treatment. Nasal washings and aspirates are unacceptable for Xpert Xpress SARS-CoV-2/FLU/RSV testing.  Fact Sheet for Patients: EntrepreneurPulse.com.au  Fact Sheet for Healthcare Providers: IncredibleEmployment.be  This test is not yet approved or cleared by the [place] FDA and has been authorized for detection and/or diagnosis of SARS-CoV-2 by FDA under an Emergency Use Authorization (EUA). This EUA will remain in effect (meaning this test can be used) for the duration of the COVID-19 declaration under Section 564(b)(1) of the Act, 21 U.S.C. section 360bbb-3(b)(1), unless the authorization is terminated or revoked.     Resp Syncytial Virus by PCR NEGATIVE NEGATIVE Final     Comment: (NOTE) Fact Sheet for Patients: EntrepreneurPulse.com.au  Fact Sheet for Healthcare Providers: IncredibleEmployment.be  This test is not yet  approved or cleared by the [place] and has been authorized for detection and/or diagnosis of SARS-CoV-2 by FDA under an Emergency Use Authorization (EUA). This EUA will remain in effect (meaning this test can be used) for the duration of the COVID-19 declaration under Section 564(b)(1) of the Act, 21 U.S.C. section 360bbb-3(b)(1), unless the authorization is terminated or revoked.  Performed at [facility], [place] [address], [place], [place] [address]   MRSA Next Gen by PCR, Nasal     Status: Abnormal   Collection Time: 10/30/22  2:37 AM   Specimen: Nasal Mucosa; Nasal Swab  Result Value Ref Range Status   MRSA by PCR Next Gen DETECTED (A) NOT DETECTED Final    Comment: RESULT CALLED TO, READ BACK BY AND VERIFIED WITH: C KETROM,RN'@[id number]'$  10/30/22 [place] (NOTE) The GeneXpert MRSA Assay (FDA approved for NASAL specimens only), is one component of a comprehensive MRSA colonization surveillance program. It is not intended to diagnose MRSA infection nor to guide or monitor treatment for MRSA infections. Test performance is not FDA approved in patients less than 6 years old. Performed at [facility], [place] [address] [place], [place] [address]      Radiology Studies: Port CXR  Result Date: 10/30/2022 CLINICAL DATA:  Central line placement EXAM: PORTABLE CHEST 1 VIEW COMPARISON:  Portable exam 1908 hours compared to 10/29/2022 FINDINGS: RIGHT jugular Swan-Ganz catheter with tip projecting over RIGHT pulmonary artery at hilum. Borderline enlargement of cardiac silhouette. Mediastinal contours and pulmonary vascularity normal. Bibasilar effusions and atelectasis. Minimal perihilar edema. No pneumothorax. IMPRESSION: RIGHT jugular Swan-Ganz catheter without pneumothorax. Probable mild  pulmonary edema with bibasilar effusions and atelectasis. Electronically Signed   By: Lavonia Dana M.D.   On: 10/30/2022 19:21   CARDIAC CATHETERIZATION  Result Date: 10/30/2022   Prox Cx to Mid Cx lesion is 30% stenosed.   Ramus lesion is 50% stenosed. Findings: Ao = 87/60 (72) LV = 93/24 RA =  15 RV = 46/18 PA = 46/28 (35) PCW = 23 Fick cardiac output/index = 2.9/1.7 Thermo CO/CI = 2.0/1.1 SVR = 1572 (Fick)  2280 (Thermo) PVR = 4.2 (Fick) 6.1 (TD) FA sat = 99% PA sat = 37%, 39% PAPi = 1.2 Assessment: 1. 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 Mild non-obstructive CAD Plan/Discussion: Leave swan in. Transfer to CCU. Start milrinone +/- NE for support. Will need cMRI. Not candidate for advance therapies with age and comorbidities. Glori Bickers, MD 6:48 PM  CT CHEST WO CONTRAST  Result Date: 10/29/2022 CLINICAL DATA:  Shortness of breath and weakness, increasing left basilar infiltrate on recent chest x-ray EXAM: CT CHEST WITHOUT CONTRAST TECHNIQUE: Multidetector CT imaging of the chest was performed following the standard protocol without IV contrast. RADIATION DOSE REDUCTION: This exam was performed according to the departmental dose-optimization program which includes automated exposure control, adjustment of the mA and/or kV according to patient size and/or use of iterative reconstruction technique. COMPARISON:  Chest x-ray from earlier the same day. FINDINGS: Cardiovascular: Somewhat limited due to lack of IV contrast. Atherosclerotic calcifications of the aorta are noted. No cardiac enlargement is seen. Pulmonary artery is not significantly enlarged. Mediastinum/Nodes: Thoracic inlet is within normal limits. No hilar or mediastinal adenopathy is noted. The esophagus as visualized is within normal limits. Lungs/Pleura: Large bilateral pleural effusions are noted with underlying lower lobe atelectatic changes. These have worsened in the interval from the prior exam. The remainder of  the aerated lung appears within  normal limits. Upper Abdomen: Visualized upper abdomen is within normal limits. Gallbladder has been surgically removed. Musculoskeletal: Degenerative changes of the thoracic spine are noted. No acute rib abnormality is noted. IMPRESSION: Large bilateral pleural effusions with underlying lower lobe atelectatic changes. This has worsened in the interval from the prior exam particularly on the right. Aortic Atherosclerosis (ICD10-I70.0). Electronically Signed   By: Inez Catalina M.D.   On: 10/29/2022 23:21   DG Chest 2 View  Result Date: 10/29/2022 CLINICAL DATA:  Shortness of breath with weakness and nonproductive cough. EXAM: CHEST - 2 VIEW COMPARISON:  10/20/2022 FINDINGS: Low volume film. The cardio pericardial silhouette is enlarged. Left base collapse/consolidation is mildly progressive in the interval and there is a small left pleural effusion. Likely tiny right pleural effusion. No findings to suggest pulmonary edema. The visualized bony structures of the thorax are unremarkable. IMPRESSION: Interval progression of left base collapse/consolidation with small bilateral pleural effusions. Electronically Signed   By: Misty Stanley M.D.   On: 10/29/2022 15:29     Scheduled Meds:  [START ON 11/04/2022] atorvastatin  10 mg Oral Weekly   busPIRone  10 mg Oral TID   Chlorhexidine Gluconate Cloth  6 each Topical Daily   donepezil  5 mg Oral QHS   DULoxetine  60 mg Oral Daily   enoxaparin (LOVENOX) injection  40 mg Subcutaneous X58I   folic acid  1 mg Oral Daily   furosemide  80 mg Intravenous BID   insulin aspart  0-5 Units Subcutaneous QHS   insulin aspart  0-9 Units Subcutaneous TID WC   mupirocin ointment   Nasal BID   potassium chloride  40 mEq Oral BID   sodium chloride flush  3 mL Intravenous Q12H   sodium chloride flush  3 mL Intravenous Q12H   spironolactone  25 mg Oral Daily   triamcinolone cream   Topical BID   Continuous Infusions:  sodium chloride      sodium chloride     sodium chloride     sodium chloride Stopped (10/30/22 1948)   milrinone 0.375 mcg/kg/min (10/31/22 1000)     LOS: 2 days    Time spent: 25mn    PDomenic Polite MD [facility]   10/31/2022, 10:20 AM

## 2022-10-31 NOTE — Progress Notes (Signed)
Advanced Heart Failure Rounding Note   Subjective:    Cath yesterday with mild CAD. RHC c/w cardiogenic shock and severe volume overload  Milrinone started. Remains on IV lasix. Diuresed well.   Feels much better. Denies CP or SOB.   PAP: (30-49)/(20-39) 35/21 CVP:  [6 mmHg-23 mmHg] 7 mmHg PCWP:  [10 mmHg] 10 mmHg CO:  [2 L/min-4.4 L/min] 4.1 L/min CI:  [1.2 L/min/m2-2.6 L/min/m2] 2.4 L/min/m2     Objective:   Weight Range:  Vital Signs:   Temp:  [97.2 F (36.2 C)-101.3 F (38.5 C)] 98.1 F (36.7 C) (01/20 1400) Pulse Rate:  [87-118] 99 (01/20 1400) Resp:  [13-32] 15 (01/20 1400) BP: (87-131)/(51-90) 87/57 (01/20 1400) SpO2:  [87 %-100 %] 96 % (01/20 1400) Weight:  [66.4 kg] 66.4 kg (01/20 0500) Last BM Date : 10/29/22  Weight change: Filed Weights   10/29/22 1417 10/30/22 0039 10/31/22 0500  Weight: 65.8 kg 67.1 kg 66.4 kg    Intake/Output:   Intake/Output Summary (Last 24 hours) at 10/31/2022 1539 Last data filed at 10/31/2022 1400 Gross per 24 hour  Intake 545.84 ml  Output 3250 ml  Net -2704.16 ml     Physical Exam: General:  Elderly weak appearing. No resp difficulty HEENT: normal Neck: supple. RIJ swan Carotids 2+ bilat; no bruits. No lymphadenopathy or thryomegaly appreciated. Cor: PMI nondisplaced. Regular rate & rhythm. No rubs, gallops or murmurs. Lungs: clear Abdomen: obese soft, nontender, nondistended. No hepatosplenomegaly. No bruits or masses. Good bowel sounds. Extremities: no cyanosis, clubbing, rash, tr edema Neuro: alert & orientedx3, cranial nerves grossly intact. moves all 4 extremities w/o difficulty. Affect pleasant  Telemetry: Sinus 90-100 Personally reviewed  Labs: Basic Metabolic Panel: Recent Labs  Lab 10/29/22 1419 10/30/22 0115 10/30/22 1755 10/30/22 1759 10/30/22 1809 10/30/22 2006 10/31/22 0423  NA 137 136 140 137  145 137  --  139  K 4.2 3.9 4.6 5.0  4.1 5.0  --  3.5  CL 102 102  --   --   --   --  100   CO2 23 20*  --   --   --   --  27  GLUCOSE 197* 203*  --   --   --   --  113*  BUN 29* 28*  --   --   --   --  28*  CREATININE 1.03* 1.10*  --   --   --  1.37* 1.30*  CALCIUM 9.3 9.0  --   --   --   --  9.3  MG  --   --   --   --   --   --  1.8    Liver Function Tests: Recent Labs  Lab 10/30/22 0115  AST 36  ALT 48*  ALKPHOS 96  BILITOT 0.6  PROT 6.0*  ALBUMIN 3.2*   No results for input(s): "LIPASE", "AMYLASE" in the last 168 hours. No results for input(s): "AMMONIA" in the last 168 hours.  CBC: Recent Labs  Lab 10/29/22 1419 10/30/22 0115 10/30/22 1755 10/30/22 1759 10/30/22 1809 10/30/22 2006 10/31/22 0423  WBC 11.1* 11.3*  --   --   --  10.1 12.0*  HGB 9.4* 9.5* 9.5* 10.2*  8.8* 10.5* 8.7* 8.7*  HCT 31.8* 32.7* 28.0* 30.0*  26.0* 31.0* 27.8* 29.9*  MCV 75.0* 75.0*  --   --   --  73.0* 75.5*  PLT 453* 387  --   --   --  399 383  Cardiac Enzymes: No results for input(s): "CKTOTAL", "CKMB", "CKMBINDEX", "TROPONINI" in the last 168 hours.  BNP: BNP (last 3 results) Recent Labs    10/29/22 1419  BNP 1,377.3*    ProBNP (last 3 results) No results for input(s): "PROBNP" in the last 8760 hours.    Other results:  Imaging: Port CXR  Result Date: 10/30/2022 CLINICAL DATA:  Central line placement EXAM: PORTABLE CHEST 1 VIEW COMPARISON:  Portable exam 1908 hours compared to 10/29/2022 FINDINGS: RIGHT jugular Swan-Ganz catheter with tip projecting over RIGHT pulmonary artery at hilum. Borderline enlargement of cardiac silhouette. Mediastinal contours and pulmonary vascularity normal. Bibasilar effusions and atelectasis. Minimal perihilar edema. No pneumothorax. IMPRESSION: RIGHT jugular Swan-Ganz catheter without pneumothorax. Probable mild pulmonary edema with bibasilar effusions and atelectasis. Electronically Signed   By: Lavonia Dana M.D.   On: 10/30/2022 19:21   CARDIAC CATHETERIZATION  Result Date: 10/30/2022   Prox Cx to Mid Cx lesion is 30%  stenosed.   Ramus lesion is 50% stenosed. Findings: Ao = 87/60 (72) LV = 93/24 RA =  15 RV = 46/18 PA = 46/28 (35) PCW = 23 Fick cardiac output/index = 2.9/1.7 Thermo CO/CI = 2.0/1.1 SVR = 1572 (Fick)  2280 (Thermo) PVR = 4.2 (Fick) 6.1 (TD) FA sat = 99% PA sat = 37%, 39% PAPi = 1.2 Assessment: 1. 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 Mild non-obstructive CAD Plan/Discussion: Leave swan in. Transfer to CCU. Start milrinone +/- NE for support. Will need cMRI. Not candidate for advance therapies with age and comorbidities. Glori Bickers, MD 6:48 PM  CT CHEST WO CONTRAST  Result Date: 10/29/2022 CLINICAL DATA:  Shortness of breath and weakness, increasing left basilar infiltrate on recent chest x-ray EXAM: CT CHEST WITHOUT CONTRAST TECHNIQUE: Multidetector CT imaging of the chest was performed following the standard protocol without IV contrast. RADIATION DOSE REDUCTION: This exam was performed according to the departmental dose-optimization program which includes automated exposure control, adjustment of the mA and/or kV according to patient size and/or use of iterative reconstruction technique. COMPARISON:  Chest x-ray from earlier the same day. FINDINGS: Cardiovascular: Somewhat limited due to lack of IV contrast. Atherosclerotic calcifications of the aorta are noted. No cardiac enlargement is seen. Pulmonary artery is not significantly enlarged. Mediastinum/Nodes: Thoracic inlet is within normal limits. No hilar or mediastinal adenopathy is noted. The esophagus as visualized is within normal limits. Lungs/Pleura: Large bilateral pleural effusions are noted with underlying lower lobe atelectatic changes. These have worsened in the interval from the prior exam. The remainder of the aerated lung appears within normal limits. Upper Abdomen: Visualized upper abdomen is within normal limits. Gallbladder has been surgically removed. Musculoskeletal: Degenerative changes of the thoracic spine  are noted. No acute rib abnormality is noted. IMPRESSION: Large bilateral pleural effusions with underlying lower lobe atelectatic changes. This has worsened in the interval from the prior exam particularly on the right. Aortic Atherosclerosis (ICD10-I70.0). Electronically Signed   By: Inez Catalina M.D.   On: 10/29/2022 23:21     Medications:     Scheduled Medications:  [START ON 11/04/2022] atorvastatin  10 mg Oral Weekly   busPIRone  10 mg Oral TID   Chlorhexidine Gluconate Cloth  6 each Topical Daily   digoxin  0.0625 mg Oral Daily   donepezil  5 mg Oral QHS   DULoxetine  60 mg Oral Daily   enoxaparin (LOVENOX) injection  40 mg Subcutaneous Q46N   folic acid  1 mg Oral Daily  insulin aspart  0-5 Units Subcutaneous QHS   insulin aspart  0-9 Units Subcutaneous TID WC   losartan  12.5 mg Oral Daily   mupirocin ointment   Nasal BID   potassium chloride  40 mEq Oral BID   sodium chloride flush  3 mL Intravenous Q12H   sodium chloride flush  3 mL Intravenous Q12H   spironolactone  25 mg Oral Daily   triamcinolone cream   Topical BID    Infusions:  sodium chloride     sodium chloride     sodium chloride     sodium chloride Stopped (10/30/22 1948)   milrinone 0.375 mcg/kg/min (10/31/22 1400)    PRN Medications: sodium chloride, sodium chloride, acetaminophen, ondansetron (ZOFRAN) IV, sodium chloride flush   Assessment/Plan   1. Acute systolic HF -> cardiogenic shock - At [facility] echo showed EF 15%, LV mod-sev dilated, LA mildly dilated, RV function normal, mod pulmonary hypertension, mod MR - Cath 10/30/22. Mild CAD. RHC with biventricular failure and cardiogenic shock - Volume status much improved  - On milrinone 0.375. Co-ox 57% Continue milrinone - Volume status improved. Hold IV lasix - Start digoxin and low-dose losartan - Continue spiro.  - Etiology of CM remains unclear. I worry may be end-stage. - Consider cMRI - Not candidate for advanced therapies with age  and comorbidities   CAD - mild by cath 10/30/22 - no s/s angina - ASA/statin  Bilateral pleural effusions - Seen on CT and CXR 1/18, suspect 2/2 volume overload - Should improve with diuresis   DMII - Hgb A1c 7.7 - Continue SSI per primary - plan for eventual SGLT2i   Microcytic anemia - anemia panel tSat 12 and ferritin 12. Will do feraheme AFTER cardiac workup complete.   6. Hypokalemia/hypomag - supp  CRITICAL CARE Performed by: Glori Bickers  Total critical care time: 35 minutes  Critical care time was exclusive of separately billable procedures and treating other patients.  Critical care was necessary to treat or prevent imminent or life-threatening deterioration.  Critical care was time spent personally by me (independent of midlevel providers or residents) on the following activities: development of treatment plan with patient and/or surrogate as well as nursing, discussions with consultants, evaluation of patient's response to treatment, examination of patient, obtaining history from patient or surrogate, ordering and performing treatments and interventions, ordering and review of laboratory studies, ordering and review of radiographic studies, pulse oximetry and re-evaluation of patient's condition.    Length of Stay: 2   Glori Bickers MD 10/31/2022, 3:39 PM  Advanced Heart Failure Team Pager [phone] (M-F; 7a - 4p)  Please contact [facility] for night-coverage after hours (4p -7a ) and weekends on [email or web address]

## 2022-11-01 DIAGNOSIS — I5021 Acute systolic (congestive) heart failure: Secondary | ICD-10-CM | POA: Diagnosis not present

## 2022-11-01 LAB — BASIC METABOLIC PANEL
Anion gap: 11 (ref 5–15)
BUN: 29 mg/dL — ABNORMAL HIGH (ref 8–23)
CO2: 27 mmol/L (ref 22–32)
Calcium: 8.9 mg/dL (ref 8.9–10.3)
Chloride: 97 mmol/L — ABNORMAL LOW (ref 98–111)
Creatinine, Ser: 1.36 mg/dL — ABNORMAL HIGH (ref 0.44–1.00)
GFR, Estimated: 40 mL/min — ABNORMAL LOW (ref >60)
Glucose, Bld: 225 mg/dL — ABNORMAL HIGH (ref 70–99)
Potassium: 4.5 mmol/L (ref 3.5–5.1)
Sodium: 135 mmol/L (ref 135–145)

## 2022-11-01 LAB — MAGNESIUM: Magnesium: 2.2 mg/dL (ref 1.7–2.4)

## 2022-11-01 LAB — CBC
HCT: 27.9 % — ABNORMAL LOW (ref 36.0–46.0)
Hemoglobin: 8.4 g/dL — ABNORMAL LOW (ref 12.0–15.0)
MCH: 22.3 pg — ABNORMAL LOW (ref 26.0–34.0)
MCHC: 30.1 g/dL (ref 30.0–36.0)
MCV: 74 fL — ABNORMAL LOW (ref 80.0–100.0)
Platelets: 327 10*3/uL (ref 150–400)
RBC: 3.77 MIL/uL — ABNORMAL LOW (ref 3.87–5.11)
RDW: 17.3 % — ABNORMAL HIGH (ref 11.5–15.5)
WBC: 9.8 10*3/uL (ref 4.0–10.5)
nRBC: 0 % (ref 0.0–0.2)

## 2022-11-01 LAB — COOXEMETRY PANEL
Carboxyhemoglobin: 1.2 % (ref 0.5–1.5)
Methemoglobin: 0.7 % (ref 0.0–1.5)
O2 Saturation: 57.8 %
Total hemoglobin: 8.5 g/dL — ABNORMAL LOW (ref 12.0–16.0)

## 2022-11-01 LAB — GLUCOSE, CAPILLARY
Glucose-Capillary: 159 mg/dL — ABNORMAL HIGH (ref 70–99)
Glucose-Capillary: 278 mg/dL — ABNORMAL HIGH (ref 70–99)
Glucose-Capillary: 135 mg/dL — ABNORMAL HIGH (ref 70–99)
Glucose-Capillary: 154 mg/dL — ABNORMAL HIGH (ref 70–99)

## 2022-11-01 MED ORDER — MELATONIN 5 MG PO TABS
5.0000 mg | ORAL_TABLET | Freq: Every evening | ORAL | Status: DC | PRN
  Administered 2022-11-03 – 2022-11-05 (×4): 5 mg via ORAL
  Filled 2022-11-01 (×4): qty 1

## 2022-11-01 MED ORDER — NOREPINEPHRINE 4 MG/250ML-% IV SOLN: 0.0000 ug/min | INTRAVENOUS | Status: DC

## 2022-11-01 MED ORDER — NOREPINEPHRINE 4 MG/250ML-% IV SOLN
0.0000 ug/min | INTRAVENOUS | Status: DC
  Filled 2022-11-01: qty 250

## 2022-11-01 MED ORDER — SODIUM CHLORIDE 0.9 % IV SOLN: 250.0000 mL | INTRAVENOUS | Status: DC

## 2022-11-01 MED ORDER — SPIRONOLACTONE 12.5 MG HALF TABLET
12.5000 mg | ORAL_TABLET | Freq: Every day | ORAL | Status: DC
  Administered 2022-11-02: 12.5 mg via ORAL
  Filled 2022-11-01: qty 1

## 2022-11-01 MED ORDER — POLYETHYLENE GLYCOL 3350 17 G PO PACK
17.0000 g | PACK | Freq: Every day | ORAL | Status: DC
  Administered 2022-11-02 – 2022-11-06 (×5): 17 g via ORAL
  Filled 2022-11-01 (×5): qty 1

## 2022-11-01 MED ORDER — GABAPENTIN 100 MG PO CAPS
100.0000 mg | ORAL_CAPSULE | Freq: Two times a day (BID) | ORAL | Status: DC
  Administered 2022-11-01 – 2022-11-06 (×11): 100 mg via ORAL
  Filled 2022-11-01 (×11): qty 1

## 2022-11-01 MED ORDER — SENNA 8.6 MG PO TABS
1.0000 | ORAL_TABLET | Freq: Every day | ORAL | Status: DC
  Administered 2022-11-01 – 2022-11-06 (×6): 8.6 mg via ORAL
  Filled 2022-11-01 (×6): qty 1

## 2022-11-01 MED ORDER — SODIUM CHLORIDE 0.9 % IV SOLN
100.0000 mg | Freq: Two times a day (BID) | INTRAVENOUS | Status: AC
  Administered 2022-11-01 – 2022-11-02 (×4): 100 mg via INTRAVENOUS
  Filled 2022-11-01 (×4): qty 100

## 2022-11-01 NOTE — Progress Notes (Addendum)
PROGRESS NOTE    Erica Cervantes  FAO:[id number] DOB: 24-Apr-1946 DOA: 10/29/2022 PCP: Caryl Bis, MD  76/F with history of type 2 diabetes mellitus, hypertension, CKD3a, cognitive deficits, depression, GERD, chronic pain, fibromyalgia presented to [facility] ER with progressive shortness of breath, cough and generalized weakness with significant decline in functional status over the last few months.  Has been laying in bed for the last few months on account of her breathing and over this time developed itching, skin rash, keeps picking at her arms, face etc.  -Chest x-ray with left base collapse,/consolidation with small bilateral pleural effusions, labs with WBC of 11.1, creatinine 1.0, troponin 23, BNP 1377 -Echo at [facility] with EF 15%, moderate MR, preserved RV, moderate pulmonary hypertension, CT chest with large bilateral effusions -Transferred to [facility] -Cardiology consulting, right and left heart cath with severe BiV failure, low output, wedge of 23, minimal nonobstructive CAD -1/19: Started on milrinone and transferred to [facility] -1/20: CVP down, Lasix discontinued, remains on milrinone  Subjective: -Feels fair overall, breathing improving, some pain involving her right facial lesions  Assessment and Plan:  Acute systolic CHF Mild to moderate MR Large bilateral effusions -New diagnosis, cath with minimal nonobstructive CAD, etiology of cardiomyopathy is unknown, echo EF 15%, moderate MR, preserved RV -RHC with low output, severe BiV failure, started on milrinone 1/19 -Diuresed with IV Lasix, she is 4.2 L negative, -CVP down, Lasix discontinued yesterday -Continue Aldactone, digoxin, losartan -Not a great candidate for SGLT2i -CHF team following,?  Start milrinone wean, consider Cardiac MRI -Not a candidate for advanced therapies with age, poor functional status -PT eval tomorrow, may need placement  Multiple skin lesions, picking, history of abscesses Mild facial  cellulitis -? MRSA folliculitis, poor personal hygiene -IV doxycycline X 2 days -Nasal MRSA PCR positive, add bactroban  Iron deficiency anemia -Anemia panel suggestive of iron deficiency, she did not notice overt bleeding, holding off on IV iron pending cardiac MRI  Cognitive deficits -awake alert oriented x 3 and appropriate -She is on Aricept at baseline, this is continued  Type 2 diabetes mellitus -Continue sliding scale insulin for now, A1c 7.7  DVT prophylaxis: lovenox Code Status: Full Code Family Communication: Niece updated at bedside Disposition Plan: may need placement  Consultants: CHF team   Procedures:   Antimicrobials:    Objective: Vitals:   11/01/22 0410 11/01/22 0500 11/01/22 0600 11/01/22 0700  BP:  104/74 111/70 125/78  Pulse: (!) 106 (!) 106 (!) 108 (!) 109  Resp: (!) 22 (!) 22 (!) 22 (!) 35  Temp: 98.4 F (36.9 C) 98.4 F (36.9 C) 98.2 F (36.8 C) 98.2 F (36.8 C)  TempSrc:      SpO2: 97% 97% 94% 96%  Weight: 66 kg     Height:        Intake/Output Summary (Last 24 hours) at 11/01/2022 1006 Last data filed at 11/01/2022 0900 Gross per 24 hour  Intake 308.7 ml  Output 1500 ml  Net -1191.3 ml   Filed Weights   10/30/22 0039 10/31/22 0500 11/01/22 0410  Weight: 67.1 kg 66.4 kg 66 kg    Examination:  General exam: Pleasant elderly chronically ill female laying in bed, AAOx3, no distress HEENT: Multiple areas of erythema excoriations on her face with scant purulence HEENT: No JVD CVS: S1-S2, regular rhythm Lungs: Decreased breath sounds to bases Abdomen: Soft, nontender, bowel sounds present Extremities: No edema  Skin: No rashes Psychiatry:  Mood & affect appropriate.  Data Reviewed:   CBC: Recent Labs  Lab 10/29/22 1419 10/30/22 0115 10/30/22 1755 10/30/22 1759 10/30/22 1809 10/30/22 2006 10/31/22 0423 11/01/22 0350  WBC 11.1* 11.3*  --   --   --  10.1 12.0* 9.8  HGB 9.4* 9.5*   < > 10.2*  8.8* 10.5* 8.7* 8.7*  8.4*  HCT 31.8* 32.7*   < > 30.0*  26.0* 31.0* 27.8* 29.9* 27.9*  MCV 75.0* 75.0*  --   --   --  73.0* 75.5* 74.0*  PLT 453* 387  --   --   --  399 383 327   < > = values in this interval not displayed.   Basic Metabolic Panel: Recent Labs  Lab 10/29/22 1419 10/30/22 0115 10/30/22 1755 10/30/22 1759 10/30/22 1809 10/30/22 2006 10/31/22 0423 11/01/22 0350  NA 137 136 140 137  145 137  --  139 135  K 4.2 3.9 4.6 5.0  4.1 5.0  --  3.5 4.5  CL 102 102  --   --   --   --  100 97*  CO2 23 20*  --   --   --   --  27 27  GLUCOSE 197* 203*  --   --   --   --  113* 225*  BUN 29* 28*  --   --   --   --  28* 29*  CREATININE 1.03* 1.10*  --   --   --  1.37* 1.30* 1.36*  CALCIUM 9.3 9.0  --   --   --   --  9.3 8.9  MG  --   --   --   --   --   --  1.8 2.2   GFR: Estimated Creatinine Clearance: 31.5 mL/min (A) (by C-G formula based on SCr of 1.36 mg/dL (H)). Liver Function Tests: Recent Labs  Lab 10/30/22 0115  AST 36  ALT 48*  ALKPHOS 96  BILITOT 0.6  PROT 6.0*  ALBUMIN 3.2*   No results for input(s): "LIPASE", "AMYLASE" in the last 168 hours. No results for input(s): "AMMONIA" in the last 168 hours. Coagulation Profile: No results for input(s): "INR", "PROTIME" in the last 168 hours. Cardiac Enzymes: No results for input(s): "CKTOTAL", "CKMB", "CKMBINDEX", "TROPONINI" in the last 168 hours. BNP (last 3 results) No results for input(s): "PROBNP" in the last 8760 hours. HbA1C: Recent Labs    10/30/22 0115  HGBA1C 7.7*   CBG: Recent Labs  Lab 10/31/22 0609 10/31/22 1125 10/31/22 1602 10/31/22 2110 11/01/22 0640  GLUCAP 106* 176* 207* 165* 159*   Lipid Profile: Recent Labs    10/31/22 0423  CHOL 98  HDL 42  LDLCALC 39  TRIG 87  CHOLHDL 2.3   Thyroid Function Tests: Recent Labs    10/31/22 0423  TSH 2.794   Anemia Panel: Recent Labs    10/30/22 0115 10/30/22 0417  VITAMINB12 297  --   FOLATE  --  >40.0  FERRITIN 12  --   TIBC 378  --   IRON 44   --   RETICCTPCT 1.7  --    Urine analysis:    Component Value Date/Time   COLORURINE YELLOW 10/29/2022 2230   APPEARANCEUR CLEAR 10/29/2022 2230   LABSPEC 1.012 10/29/2022 2230   PHURINE 5.0 10/29/2022 2230   GLUCOSEU NEGATIVE 10/29/2022 2230   HGBUR NEGATIVE 10/29/2022 2230   BILIRUBINUR NEGATIVE 10/29/2022 2230   KETONESUR NEGATIVE 10/29/2022 2230   PROTEINUR 30 (A) 10/29/2022 2230  NITRITE NEGATIVE 10/29/2022 2230   LEUKOCYTESUR SMALL (A) 10/29/2022 2230   Sepsis Labs: '@LABRCNTIP'$ (procalcitonin:4,lacticidven:4)  ) Recent Results (from the past 240 hour(s))  Culture, blood (Routine X 2) w Reflex to ID Panel     Status: None (Preliminary result)   Collection Time: 10/29/22  9:45 PM   Specimen: BLOOD  Result Value Ref Range Status   Specimen Description BLOOD LEFT ANTECUBITAL  Final   Special Requests   Final    BOTTLES DRAWN AEROBIC AND ANAEROBIC Blood Culture results may not be optimal due to an excessive volume of blood received in culture bottles   Culture   Final    NO GROWTH 3 DAYS Performed at [facility], [place] [address], [place], [place] [address]    Report Status PENDING  Incomplete  Culture, blood (Routine X 2) w Reflex to ID Panel     Status: None (Preliminary result)   Collection Time: 10/29/22  9:45 PM   Specimen: BLOOD  Result Value Ref Range Status   Specimen Description BLOOD LEFT ANTECUBITAL  Final   Special Requests   Final    BOTTLES DRAWN AEROBIC AND ANAEROBIC Blood Culture adequate volume   Culture   Final    NO GROWTH 3 DAYS Performed at [facility], [place] [address], [place], [place] [address]    Report Status PENDING  Incomplete  Resp panel by RT-PCR (RSV, Flu A&B, Covid) Anterior Nasal Swab     Status: None   Collection Time: 10/30/22  1:40 AM   Specimen: Anterior Nasal Swab  Result Value Ref Range Status   SARS Coronavirus 2 by RT PCR NEGATIVE NEGATIVE Final    Comment: (NOTE) SARS-CoV-2 target nucleic acids are NOT  DETECTED.  The SARS-CoV-2 RNA is generally detectable in upper respiratory specimens during the acute phase of infection. The lowest concentration of SARS-CoV-2 viral copies this assay can detect is 138 copies/mL. A negative result does not preclude SARS-Cov-2 infection and should not be used as the sole basis for treatment or other patient management decisions. A negative result may occur with  improper specimen collection/handling, submission of specimen other than nasopharyngeal swab, presence of viral mutation(s) within the areas targeted by this assay, and inadequate number of viral copies(<138 copies/mL). A negative result must be combined with clinical observations, patient history, and epidemiological information. The expected result is Negative.  Fact Sheet for Patients:  EntrepreneurPulse.com.au  Fact Sheet for Healthcare Providers:  IncredibleEmployment.be  This test is no t yet approved or cleared by the [place] FDA and  has been authorized for detection and/or diagnosis of SARS-CoV-2 by FDA under an Emergency Use Authorization (EUA). This EUA will remain  in effect (meaning this test can be used) for the duration of the COVID-19 declaration under Section 564(b)(1) of the Act, 21 U.S.C.section 360bbb-3(b)(1), unless the authorization is terminated  or revoked sooner.       Influenza A by PCR NEGATIVE NEGATIVE Final   Influenza B by PCR NEGATIVE NEGATIVE Final    Comment: (NOTE) The Xpert Xpress SARS-CoV-2/FLU/RSV plus assay is intended as an aid in the diagnosis of influenza from Nasopharyngeal swab specimens and should not be used as a sole basis for treatment. Nasal washings and aspirates are unacceptable for Xpert Xpress SARS-CoV-2/FLU/RSV testing.  Fact Sheet for Patients: EntrepreneurPulse.com.au  Fact Sheet for Healthcare Providers: IncredibleEmployment.be  This test is not yet  approved or cleared by the [place] FDA and has been authorized for detection and/or diagnosis  of SARS-CoV-2 by FDA under an Emergency Use Authorization (EUA). This EUA will remain in effect (meaning this test can be used) for the duration of the COVID-19 declaration under Section 564(b)(1) of the Act, 21 U.S.C. section 360bbb-3(b)(1), unless the authorization is terminated or revoked.     Resp Syncytial Virus by PCR NEGATIVE NEGATIVE Final    Comment: (NOTE) Fact Sheet for Patients: EntrepreneurPulse.com.au  Fact Sheet for Healthcare Providers: IncredibleEmployment.be  This test is not yet approved or cleared by the [place] FDA and has been authorized for detection and/or diagnosis of SARS-CoV-2 by FDA under an Emergency Use Authorization (EUA). This EUA will remain in effect (meaning this test can be used) for the duration of the COVID-19 declaration under Section 564(b)(1) of the Act, 21 U.S.C. section 360bbb-3(b)(1), unless the authorization is terminated or revoked.  Performed at [facility], [place] [address] [place], [place] [address]   MRSA Next Gen by PCR, Nasal     Status: Abnormal   Collection Time: 10/30/22  2:37 AM   Specimen: Nasal Mucosa; Nasal Swab  Result Value Ref Range Status   MRSA by PCR Next Gen DETECTED (A) NOT DETECTED Final    Comment: RESULT CALLED TO, READ BACK BY AND VERIFIED WITH: C KETROM,RN'@0438'$  10/30/22 [place] (NOTE) The GeneXpert MRSA Assay (FDA approved for NASAL specimens only), is one component of a comprehensive MRSA colonization surveillance program. It is not intended to diagnose MRSA infection nor to guide or monitor treatment for MRSA infections. Test performance is not FDA approved in patients less than 77 years old. Performed at [facility], [place] [address] [place], [place] [address]      Radiology Studies: Port CXR  Result Date: 10/30/2022 CLINICAL DATA:  Central line  placement EXAM: PORTABLE CHEST 1 VIEW COMPARISON:  Portable exam 1908 hours compared to 10/29/2022 FINDINGS: RIGHT jugular Swan-Ganz catheter with tip projecting over RIGHT pulmonary artery at hilum. Borderline enlargement of cardiac silhouette. Mediastinal contours and pulmonary vascularity normal. Bibasilar effusions and atelectasis. Minimal perihilar edema. No pneumothorax. IMPRESSION: RIGHT jugular Swan-Ganz catheter without pneumothorax. Probable mild pulmonary edema with bibasilar effusions and atelectasis. Electronically Signed   By: Lavonia Dana M.D.   On: 10/30/2022 19:21   CARDIAC CATHETERIZATION  Result Date: 10/30/2022   Prox Cx to Mid Cx lesion is 30% stenosed.   Ramus lesion is 50% stenosed. Findings: Ao = 87/60 (72) LV = 93/24 RA =  15 RV = 46/18 PA = 46/28 (35) PCW = 23 Fick cardiac output/index = 2.9/1.7 Thermo CO/CI = 2.0/1.1 SVR = 1572 (Fick)  2280 (Thermo) PVR = 4.2 (Fick) 6.1 (TD) FA sat = 99% PA sat = 37%, 39% PAPi = 1.2 Assessment: 1. 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 Mild non-obstructive CAD Plan/Discussion: Leave swan in. Transfer to CCU. Start milrinone +/- NE for support. Will need cMRI. Not candidate for advance therapies with age and comorbidities. Glori Bickers, MD 6:48 PM    Scheduled Meds:  Derrill Memo ON 11/04/2022] atorvastatin  10 mg Oral Weekly   busPIRone  10 mg Oral TID   Chlorhexidine Gluconate Cloth  6 each Topical Daily   digoxin  0.0625 mg Oral Daily   donepezil  5 mg Oral QHS   DULoxetine  60 mg Oral Daily   enoxaparin (LOVENOX) injection  40 mg Subcutaneous V42V   folic acid  1 mg Oral Daily   insulin aspart  0-5 Units Subcutaneous QHS   insulin aspart  0-9 Units Subcutaneous TID WC   losartan  12.5 mg Oral Daily   mupirocin ointment   Nasal BID   sodium chloride flush  3 mL Intravenous Q12H   sodium chloride flush  3 mL Intravenous Q12H   spironolactone  25 mg Oral Daily   triamcinolone cream   Topical BID   Continuous  Infusions:  sodium chloride     sodium chloride     sodium chloride     sodium chloride Stopped (10/30/22 1948)   milrinone 0.375 mcg/kg/min (11/01/22 0721)     LOS: 3 days    Time spent: 10mn    PDomenic Polite MD [facility]   11/01/2022, 10:06 AM

## 2022-11-01 NOTE — Progress Notes (Signed)
Advanced Heart Failure Rounding Note   Subjective:    Cath 1/19 with mild CAD. RHC c/w cardiogenic shock and severe volume overload  Milrinone started. Remains on milrinone 0.375 Lasix gtt stopped yesterday  Feels good. No CP, SOB, orthopnea or PND  SBP 80-90s   Swan #s done personally CVP 10 PA 38/17 (25) PCWP 12 Therm 3.9/2.3 SVR 1299 PVR 3.9 PAPi 2.1  Objective:   Weight Range:  Vital Signs:   Temp:  [98.1 F (36.7 C)-99 F (37.2 C)] 98.2 F (36.8 C) (01/21 0700) Pulse Rate:  [99-121] 109 (01/21 0700) Resp:  [13-35] 35 (01/21 0700) BP: (78-125)/(51-78) 125/78 (01/21 0700) SpO2:  [94 %-100 %] 96 % (01/21 0700) Weight:  [66 kg] 66 kg (01/21 0410) Last BM Date : 10/29/22  Weight change: Filed Weights   10/30/22 0039 10/31/22 0500 11/01/22 0410  Weight: 67.1 kg 66.4 kg 66 kg    Intake/Output:   Intake/Output Summary (Last 24 hours) at 11/01/2022 1123 Last data filed at 11/01/2022 0900 Gross per 24 hour  Intake 308.7 ml  Output 900 ml  Net -591.3 ml     Physical Exam: General:  Elderly weak appearing. No resp difficulty HEENT: normal Neck: supple. RIJ swan. Carotids 2+ bilat; no bruits. No lymphadenopathy or thryomegaly appreciated. Cor: PMI nondisplaced. Regular rate & rhythm. No rubs, gallops or murmurs. Lungs: clear Abdomen: obese soft, nontender, nondistended. No hepatosplenomegaly. No bruits or masses. Good bowel sounds. Extremities: no cyanosis, clubbing, rash, edema Neuro: alert & orientedx3, cranial nerves grossly intact. moves all 4 extremities w/o difficulty. Affect pleasant   Telemetry: Sinus 100-110 Personally reviewed  Labs: Basic Metabolic Panel: Recent Labs  Lab 10/29/22 1419 10/30/22 0115 10/30/22 1755 10/30/22 1759 10/30/22 1809 10/30/22 2006 10/31/22 0423 11/01/22 0350  NA 137 136 140 137  145 137  --  139 135  K 4.2 3.9 4.6 5.0  4.1 5.0  --  3.5 4.5  CL 102 102  --   --   --   --  100 97*  CO2 23 20*  --   --   --    --  27 27  GLUCOSE 197* 203*  --   --   --   --  113* 225*  BUN 29* 28*  --   --   --   --  28* 29*  CREATININE 1.03* 1.10*  --   --   --  1.37* 1.30* 1.36*  CALCIUM 9.3 9.0  --   --   --   --  9.3 8.9  MG  --   --   --   --   --   --  1.8 2.2    Liver Function Tests: Recent Labs  Lab 10/30/22 0115  AST 36  ALT 48*  ALKPHOS 96  BILITOT 0.6  PROT 6.0*  ALBUMIN 3.2*   No results for input(s): "LIPASE", "AMYLASE" in the last 168 hours. No results for input(s): "AMMONIA" in the last 168 hours.  CBC: Recent Labs  Lab 10/29/22 1419 10/30/22 0115 10/30/22 1755 10/30/22 1759 10/30/22 1809 10/30/22 2006 10/31/22 0423 11/01/22 0350  WBC 11.1* 11.3*  --   --   --  10.1 12.0* 9.8  HGB 9.4* 9.5*   < > 10.2*  8.8* 10.5* 8.7* 8.7* 8.4*  HCT 31.8* 32.7*   < > 30.0*  26.0* 31.0* 27.8* 29.9* 27.9*  MCV 75.0* 75.0*  --   --   --  73.0* 75.5* 74.0*  PLT 453* 387  --   --   --  399 383 327   < > = values in this interval not displayed.    Cardiac Enzymes: No results for input(s): "CKTOTAL", "CKMB", "CKMBINDEX", "TROPONINI" in the last 168 hours.  BNP: BNP (last 3 results) Recent Labs    10/29/22 1419  BNP 1,377.3*    ProBNP (last 3 results) No results for input(s): "PROBNP" in the last 8760 hours.    Other results:  Imaging: Port CXR  Result Date: 10/30/2022 CLINICAL DATA:  Central line placement EXAM: PORTABLE CHEST 1 VIEW COMPARISON:  Portable exam 1908 hours compared to 10/29/2022 FINDINGS: RIGHT jugular Swan-Ganz catheter with tip projecting over RIGHT pulmonary artery at hilum. Borderline enlargement of cardiac silhouette. Mediastinal contours and pulmonary vascularity normal. Bibasilar effusions and atelectasis. Minimal perihilar edema. No pneumothorax. IMPRESSION: RIGHT jugular Swan-Ganz catheter without pneumothorax. Probable mild pulmonary edema with bibasilar effusions and atelectasis. Electronically Signed   By: Lavonia Dana M.D.   On: 10/30/2022 19:21    CARDIAC CATHETERIZATION  Result Date: 10/30/2022   Prox Cx to Mid Cx lesion is 30% stenosed.   Ramus lesion is 50% stenosed. Findings: Ao = 87/60 (72) LV = 93/24 RA =  15 RV = 46/18 PA = 46/28 (35) PCW = 23 Fick cardiac output/index = 2.9/1.7 Thermo CO/CI = 2.0/1.1 SVR = 1572 (Fick)  2280 (Thermo) PVR = 4.2 (Fick) 6.1 (TD) FA sat = 99% PA sat = 37%, 39% PAPi = 1.2 Assessment: 1. 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 Mild non-obstructive CAD Plan/Discussion: Leave swan in. Transfer to CCU. Start milrinone +/- NE for support. Will need cMRI. Not candidate for advance therapies with age and comorbidities. Glori Bickers, MD 6:48 PM    Medications:     Scheduled Medications:  [START ON 11/04/2022] atorvastatin  10 mg Oral Weekly   busPIRone  10 mg Oral TID   Chlorhexidine Gluconate Cloth  6 each Topical Daily   digoxin  0.0625 mg Oral Daily   donepezil  5 mg Oral QHS   DULoxetine  60 mg Oral Daily   enoxaparin (LOVENOX) injection  40 mg Subcutaneous N86V   folic acid  1 mg Oral Daily   gabapentin  100 mg Oral BID   insulin aspart  0-5 Units Subcutaneous QHS   insulin aspart  0-9 Units Subcutaneous TID WC   mupirocin ointment   Nasal BID   polyethylene glycol  17 g Oral Daily   senna  1 tablet Oral Daily   sodium chloride flush  3 mL Intravenous Q12H   sodium chloride flush  3 mL Intravenous Q12H   [START ON 11/02/2022] spironolactone  12.5 mg Oral Daily   triamcinolone cream   Topical BID    Infusions:  sodium chloride     sodium chloride     sodium chloride     sodium chloride Stopped (10/30/22 1948)   sodium chloride     doxycycline (VIBRAMYCIN) IV 100 mg (11/01/22 1122)   milrinone 0.375 mcg/kg/min (11/01/22 0721)   norepinephrine (LEVOPHED) Adult infusion      PRN Medications: sodium chloride, sodium chloride, acetaminophen, melatonin, ondansetron (ZOFRAN) IV, sodium chloride flush   Assessment/Plan   1. Acute systolic HF -> cardiogenic shock -  At [facility] echo showed EF 15%, LV mod-sev dilated, LA mildly dilated, RV function normal, mod pulmonary hypertension, mod MR - Cath 10/30/22. Mild CAD. RHC with biventricular failure and cardiogenic shock - Volume status much  improved  - On milrinone 0.375. Co-ox 58% Continue milrinone - Volume status ok  - BP low. Can add NE as needed - Continue digoxin - Stop losartan - Drop spiro to 12.5.  - Etiology of CM remains unclear. I worry may be end-stage. - cMRI once swan out - Not candidate for advanced therapies with age and comorbidities   CAD - mild by cath 10/30/22 - no s/s angina - ASA/statin  Bilateral pleural effusions - Seen on CT and CXR 1/18, suspect 2/2 volume overload - Should improve with diuresis   DMII - Hgb A1c 7.7 - Continue SSI per primary - plan for eventual SGLT2i   Microcytic anemia - anemia panel Sat 12 and ferritin 12. Will do feraheme AFTER cardiac workup complete.   6. Hypokalemia/hypomag - supp  CRITICAL CARE Performed by: Glori Bickers  Total critical care time: 35 minutes  Critical care time was exclusive of separately billable procedures and treating other patients.  Critical care was necessary to treat or prevent imminent or life-threatening deterioration.  Critical care was time spent personally by me (independent of midlevel providers or residents) on the following activities: development of treatment plan with patient and/or surrogate as well as nursing, discussions with consultants, evaluation of patient's response to treatment, examination of patient, obtaining history from patient or surrogate, ordering and performing treatments and interventions, ordering and review of laboratory studies, ordering and review of radiographic studies, pulse oximetry and re-evaluation of patient's condition.    Length of Stay: 3   Glori Bickers MD 11/01/2022, 11:23 AM  Advanced Heart Failure Team Pager [phone] (M-F; [place])  Please  contact [facility] for night-coverage after hours (4p -7a ) and weekends on [email or web address]

## 2022-11-02 ENCOUNTER — Inpatient Hospital Stay (HOSPITAL_COMMUNITY): Payer: PPO

## 2022-11-02 ENCOUNTER — Inpatient Hospital Stay: Payer: Self-pay

## 2022-11-02 ENCOUNTER — Encounter (HOSPITAL_COMMUNITY): Payer: Self-pay | Admitting: Internal Medicine

## 2022-11-02 DIAGNOSIS — I3139 Other pericardial effusion (noninflammatory): Secondary | ICD-10-CM | POA: Diagnosis not present

## 2022-11-02 LAB — COOXEMETRY PANEL
Carboxyhemoglobin: 1.5 % (ref 0.5–1.5)
Methemoglobin: <0.7 % (ref 0.0–1.5)
O2 Saturation: 57.3 %
Total hemoglobin: 8.1 g/dL — ABNORMAL LOW (ref 12.0–16.0)

## 2022-11-02 LAB — BASIC METABOLIC PANEL
Anion gap: 6 (ref 5–15)
BUN: 26 mg/dL — ABNORMAL HIGH (ref 8–23)
CO2: 28 mmol/L (ref 22–32)
Calcium: 8.6 mg/dL — ABNORMAL LOW (ref 8.9–10.3)
Chloride: 102 mmol/L (ref 98–111)
Creatinine, Ser: 1.08 mg/dL — ABNORMAL HIGH (ref 0.44–1.00)
GFR, Estimated: 53 mL/min — ABNORMAL LOW (ref >60)
Glucose, Bld: 180 mg/dL — ABNORMAL HIGH (ref 70–99)
Potassium: 4.4 mmol/L (ref 3.5–5.1)
Sodium: 136 mmol/L (ref 135–145)

## 2022-11-02 LAB — CBC
HCT: 26.1 % — ABNORMAL LOW (ref 36.0–46.0)
Hemoglobin: 7.8 g/dL — ABNORMAL LOW (ref 12.0–15.0)
MCH: 22.5 pg — ABNORMAL LOW (ref 26.0–34.0)
MCHC: 29.9 g/dL — ABNORMAL LOW (ref 30.0–36.0)
MCV: 75.2 fL — ABNORMAL LOW (ref 80.0–100.0)
Platelets: 281 10*3/uL (ref 150–400)
RBC: 3.47 MIL/uL — ABNORMAL LOW (ref 3.87–5.11)
RDW: 17.2 % — ABNORMAL HIGH (ref 11.5–15.5)
WBC: 8 10*3/uL (ref 4.0–10.5)
nRBC: 0 % (ref 0.0–0.2)

## 2022-11-02 LAB — GLUCOSE, CAPILLARY
Glucose-Capillary: 170 mg/dL — ABNORMAL HIGH (ref 70–99)
Glucose-Capillary: 248 mg/dL — ABNORMAL HIGH (ref 70–99)
Glucose-Capillary: 134 mg/dL — ABNORMAL HIGH (ref 70–99)
Glucose-Capillary: 168 mg/dL — ABNORMAL HIGH (ref 70–99)

## 2022-11-02 LAB — MAGNESIUM: Magnesium: 2.1 mg/dL (ref 1.7–2.4)

## 2022-11-02 MED ORDER — SODIUM CHLORIDE 0.9% FLUSH
10.0000 mL | Freq: Two times a day (BID) | INTRAVENOUS | Status: DC
  Administered 2022-11-02 – 2022-11-06 (×8): 10 mL

## 2022-11-02 MED ORDER — ASPIRIN 81 MG PO TBEC
81.0000 mg | DELAYED_RELEASE_TABLET | Freq: Every day | ORAL | Status: DC
  Administered 2022-11-02 – 2022-11-06 (×5): 81 mg via ORAL
  Filled 2022-11-02 (×5): qty 1

## 2022-11-02 MED ORDER — GADOBUTROL 1 MMOL/ML IV SOLN
7.0000 mL | Freq: Once | INTRAVENOUS | Status: AC | PRN
  Administered 2022-11-02: 7 mL via INTRAVENOUS

## 2022-11-02 MED ORDER — SODIUM CHLORIDE 0.9% FLUSH: 10.0000 mL | INTRAVENOUS | Status: DC | PRN

## 2022-11-02 MED FILL — Heparin Sod (Porcine)-NaCl IV Soln 1000 Unit/500ML-0.9%: INTRAVENOUS | Qty: 1000 | Status: AC

## 2022-11-02 NOTE — Evaluation (Signed)
Physical Therapy Evaluation Patient Details Name: Erica Cervantes MRN: [id number] DOB: August 27, 1946 Today's Date: 11/02/2022  History of Present Illness  Pt is a 77 y.o. F who present 10/29/2022 with acute systolic CHF and large bilateral effusions. Cardiology consulting, right and left heart cath with severe BiV failure. Significant PMH: DM2, HTN, CKD3a, cognitive deficits, chronic pain, fibromyalgia.  Clinical Impression  PTA, pt lives with a roommate, is typically ambulatory with a RW vs "furniture surfing," and independent with ADL's. Pt reports being increasingly sedentary since two days prior to Christmas due to malaise, however, still reports she was able to ambulate to bathroom and take a shower. Pt presents with decreased cardiopulmonary endurance and mild balance deficits. Pt ambulating 100 ft with a RW at a min guard assist level, HR 103-112 bpm, SpO2 97-100% on RA. Suspect steady progress. Pt will benefit from HHPT follow up at d/c.     Recommendations for follow up therapy are one component of a multi-disciplinary discharge planning process, led by the attending physician.  Recommendations may be updated based on patient status, additional functional criteria and insurance authorization.  Follow Up Recommendations Home health PT      Assistance Recommended at Discharge PRN  Patient can return home with the following  A little help with walking and/or transfers;A little help with bathing/dressing/bathroom;Assistance with cooking/housework;Assist for transportation;Help with stairs or ramp for entrance    Equipment Recommendations None recommended by PT  Recommendations for Other Services       Functional Status Assessment Patient has had a recent decline in their functional status and demonstrates the ability to make significant improvements in function in a reasonable and predictable amount of time.     Precautions / Restrictions Precautions Precautions: Fall Restrictions Weight  Bearing Restrictions: No      Mobility  Bed Mobility Overal bed mobility: Needs Assistance Bed Mobility: Supine to Sit     Supine to sit: Min guard          Transfers Overall transfer level: Needs assistance Equipment used: Rolling walker (2 wheels) Transfers: Sit to/from Stand, Bed to chair/wheelchair/BSC Sit to Stand: Min guard Stand pivot transfers: Min guard              Ambulation/Gait Ambulation/Gait assistance: Min guard, +2 safety/equipment Gait Distance (Feet): 100 Feet Assistive device: Rolling walker (2 wheels) Gait Pattern/deviations: Step-through pattern, Decreased stride length Gait velocity: decreased     General Gait Details: Slow and steady pace, min guard for safety. No overt LOB  Stairs            Wheelchair Mobility    Modified Rankin (Stroke Patients Only)       Balance Overall balance assessment: Mild deficits observed, not formally tested                                           Pertinent Vitals/Pain Pain Assessment Pain Assessment: No/denies pain    Home Living Family/patient expects to be discharged to:: Private residence Living Arrangements: Non-relatives/Friends (roommate) Available Help at Discharge: Personal care attendant;Available 24 hours/day Type of Home: House Home Access: Stairs to enter Entrance Stairs-Rails: Psychiatric nurse of Steps: 1   Home Layout: One level Home Equipment: Advice worker (2 wheels);Rollator (4 wheels);Cane - single point;BSC/3in1;Wheelchair - manual;Other (comment) (adjustable bed with no rails)      Prior Function Prior Level of Function :  Needs assist             Mobility Comments: using RW, reports increasingly sedentary since two days before Christmas ADLs Comments: independent ADL's, reports has assist for IADL's     Hand Dominance        Extremity/Trunk Assessment   Upper Extremity Assessment Upper Extremity  Assessment: Defer to OT evaluation    Lower Extremity Assessment Lower Extremity Assessment: Generalized weakness    Cervical / Trunk Assessment Cervical / Trunk Assessment: Normal  Communication   Communication: No difficulties  Cognition Arousal/Alertness: Awake/alert Behavior During Therapy: WFL for tasks assessed/performed Overall Cognitive Status: History of cognitive impairments - at baseline Area of Impairment: Memory                     Memory: Decreased short-term memory         General Comments: Unclear historian; hx of cognitive deficits        General Comments      Exercises     Assessment/Plan    PT Assessment Patient needs continued PT services  PT Problem List Decreased strength;Decreased activity tolerance;Decreased balance;Decreased mobility;Decreased cognition       PT Treatment Interventions DME instruction;Gait training;Stair training;Functional mobility training;Therapeutic activities;Therapeutic exercise;Balance training;Patient/family education    PT Goals (Current goals can be found in the Care Plan section)  Acute Rehab PT Goals Patient Stated Goal: walk more PT Goal Formulation: With patient Time For Goal Achievement: 11/16/22 Potential to Achieve Goals: Good    Frequency Min 3X/week     Co-evaluation               AM-PAC PT "6 Clicks" Mobility  Outcome Measure Help needed turning from your back to your side while in a flat bed without using bedrails?: A Little Help needed moving from lying on your back to sitting on the side of a flat bed without using bedrails?: A Little Help needed moving to and from a bed to a chair (including a wheelchair)?: A Little Help needed standing up from a chair using your arms (e.g., wheelchair or bedside chair)?: A Little Help needed to walk in hospital room?: A Little Help needed climbing 3-5 steps with a railing? : A Little 6 Click Score: 18    End of Session Equipment Utilized  During Treatment: Gait belt Activity Tolerance: Patient tolerated treatment well Patient left: in chair;with call bell/phone within reach;with chair alarm set Nurse Communication: Mobility status PT Visit Diagnosis: Unsteadiness on feet (R26.81);Muscle weakness (generalized) (M62.81);Difficulty in walking, not elsewhere classified (R26.2)    Time: 1761-6073 PT Time Calculation (min) (ACUTE ONLY): 28 min   Charges:   PT Evaluation $PT Eval Moderate Complexity: 1 Mod PT Treatments $Therapeutic Activity: 8-22 mins        Wyona Almas, PT, DPT Acute Rehabilitation Services Office [phone]   Deno Etienne 11/02/2022, 3:37 PM

## 2022-11-02 NOTE — Progress Notes (Signed)
Peripherally Inserted Central Catheter Placement  The IV Nurse has discussed with the patient and/or persons authorized to consent for the patient, the purpose of this procedure and the potential benefits and risks involved with this procedure.  The benefits include less needle sticks, lab draws from the catheter, and the patient may be discharged home with the catheter. Risks include, but not limited to, infection, bleeding, blood clot (thrombus formation), and puncture of an artery; nerve damage and irregular heartbeat and possibility to perform a PICC exchange if needed/ordered by physician.  Alternatives to this procedure were also discussed.  Bard Power PICC patient education guide, fact sheet on infection prevention and patient information card has been provided to patient /or left at bedside.    PICC Placement Documentation  PICC Double Lumen 46/96/29 Right Basilic 35 cm 0 cm (Active)  Indication for Insertion or Continuance of Line Vasoactive infusions 11/02/22 1120  Exposed Catheter (cm) 0 cm 11/02/22 1120  Site Assessment Clean, Dry, Intact 11/02/22 1120  Lumen #1 Status Flushed;Blood return noted;Saline locked 11/02/22 1120  Lumen #2 Status Flushed;Blood return noted;Saline locked 11/02/22 1120  Dressing Type Transparent 11/02/22 1120  Dressing Status Antimicrobial disc in place 11/02/22 1120  Dressing Change Due 11/09/22 11/02/22 1120       Gudrun Axe Ramos 11/02/2022, 11:29 AM

## 2022-11-02 NOTE — Progress Notes (Addendum)
Advanced Heart Failure Rounding Note   Subjective:    Cath 1/19 with mild CAD. RHC c/w cardiogenic shock and severe volume overload  Milrinone 0.375 mcg --->  57%  Swan #s CVP 6  PA 26/17 PCWP 12 Thermo 2.4    Feels ok. Denies SOB>     Objective:   Weight Range:  Vital Signs:   Temp:  [97.3 F (36.3 C)-100 F (37.8 C)] 97.3 F (36.3 C) (01/22 0600) Pulse Rate:  [98-115] 99 (01/22 0600) Resp:  [14-25] 17 (01/22 0600) BP: (81-128)/(52-96) 94/58 (01/22 0600) SpO2:  [92 %-100 %] 92 % (01/22 0600) Weight:  [68 kg] 68 kg (01/22 0530) Last BM Date : 10/29/22  Weight change: Filed Weights   10/31/22 0500 11/01/22 0410 11/02/22 0530  Weight: 66.4 kg 66 kg 68 kg    Intake/Output:   Intake/Output Summary (Last 24 hours) at 11/02/2022 0736 Last data filed at 11/02/2022 0600 Gross per 24 hour  Intake 934.09 ml  Output 875 ml  Net 59.09 ml    CVP 6 Physical Exam: General:  In bed.  Neck: supple. no JVD. Carotids 2+ bilat; no bruits. No lymphadenopathy or thryomegaly appreciated. RIJ  Cor: PMI nondisplaced. Regular rate & rhythm. No rubs, gallops or murmurs. Lungs: clear Abdomen: soft, nontender, nondistended. No hepatosplenomegaly. No bruits or masses. Good bowel sounds. Extremities: no cyanosis, clubbing, rash, edema Neuro: alert & orientedx3, cranial nerves grossly intact. moves all 4 extremities w/o difficulty. Affect flat    Telemetry: ST 100s  Labs: Basic Metabolic Panel: Recent Labs  Lab 10/29/22 1419 10/30/22 0115 10/30/22 1755 10/30/22 1759 10/30/22 1809 10/30/22 2006 10/31/22 0423 11/01/22 0350 11/02/22 0510  NA 137 136   < > 137  145 137  --  139 135 136  K 4.2 3.9   < > 5.0  4.1 5.0  --  3.5 4.5 4.4  CL 102 102  --   --   --   --  100 97* 102  CO2 23 20*  --   --   --   --  '27 27 28  '$ GLUCOSE 197* 203*  --   --   --   --  113* 225* 180*  BUN 29* 28*  --   --   --   --  28* 29* 26*  CREATININE 1.03* 1.10*  --   --   --  1.37* 1.30* 1.36*  1.08*  CALCIUM 9.3 9.0  --   --   --   --  9.3 8.9 8.6*  MG  --   --   --   --   --   --  1.8 2.2 2.1   < > = values in this interval not displayed.    Liver Function Tests: Recent Labs  Lab 10/30/22 0115  AST 36  ALT 48*  ALKPHOS 96  BILITOT 0.6  PROT 6.0*  ALBUMIN 3.2*   No results for input(s): "LIPASE", "AMYLASE" in the last 168 hours. No results for input(s): "AMMONIA" in the last 168 hours.  CBC: Recent Labs  Lab 10/30/22 0115 10/30/22 1755 10/30/22 1809 10/30/22 2006 10/31/22 0423 11/01/22 0350 11/02/22 0510  WBC 11.3*  --   --  10.1 12.0* 9.8 8.0  HGB 9.5*   < > 10.5* 8.7* 8.7* 8.4* 7.8*  HCT 32.7*   < > 31.0* 27.8* 29.9* 27.9* 26.1*  MCV 75.0*  --   --  73.0* 75.5* 74.0* 75.2*  PLT 387  --   --  399 383 327 281   < > = values in this interval not displayed.    Cardiac Enzymes: No results for input(s): "CKTOTAL", "CKMB", "CKMBINDEX", "TROPONINI" in the last 168 hours.  BNP: BNP (last 3 results) Recent Labs    10/29/22 1419  BNP 1,377.3*    ProBNP (last 3 results) No results for input(s): "PROBNP" in the last 8760 hours.    Other results:  Imaging: No results found.   Medications:     Scheduled Medications:  [START ON 11/04/2022] atorvastatin  10 mg Oral Weekly   busPIRone  10 mg Oral TID   Chlorhexidine Gluconate Cloth  6 each Topical Daily   digoxin  0.0625 mg Oral Daily   donepezil  5 mg Oral QHS   DULoxetine  60 mg Oral Daily   enoxaparin (LOVENOX) injection  40 mg Subcutaneous O17R   folic acid  1 mg Oral Daily   gabapentin  100 mg Oral BID   insulin aspart  0-5 Units Subcutaneous QHS   insulin aspart  0-9 Units Subcutaneous TID WC   mupirocin ointment   Nasal BID   polyethylene glycol  17 g Oral Daily   senna  1 tablet Oral Daily   sodium chloride flush  3 mL Intravenous Q12H   sodium chloride flush  3 mL Intravenous Q12H   spironolactone  12.5 mg Oral Daily   triamcinolone cream   Topical BID    Infusions:  sodium  chloride     sodium chloride     sodium chloride     sodium chloride Stopped (10/30/22 1948)   sodium chloride     doxycycline (VIBRAMYCIN) IV Stopped (11/02/22 0104)   milrinone 0.375 mcg/kg/min (11/02/22 0600)   norepinephrine (LEVOPHED) Adult infusion      PRN Medications: sodium chloride, sodium chloride, acetaminophen, melatonin, ondansetron (ZOFRAN) IV, sodium chloride flush   Assessment/Plan   1. Acute systolic HF -> cardiogenic shock - At [facility] echo showed EF 15%, LV mod-sev dilated, LA mildly dilated, RV function normal, mod pulmonary hypertension, mod MR - Cath 10/30/22. Mild CAD. RHC with biventricular failure and cardiogenic shock - On milrinone 0.375. Co-ox 57%--> cut back milrinone to 0.25 mcg. Continue milrinone - CVP 5-6. Hold diuretics.  - Continue digoxin - Off arb with hypotension.  - Continue lower dose of spiro to 12.5.  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 - cMRI later today. Remove swan and will leave introducer.  - Not candidate for advanced therapies with age and comorbidities  - Place PICC  CAD - mild by cath 10/30/22 - No chest pain.  - ASA/statin  Bilateral pleural effusions - Seen on CT and CXR 1/18, suspect 2/2 volume overload - Should improve with diuresis   DMII - Hgb A1c 7.7 - Continue SSI per primary - plan for eventual SGLT2i   Microcytic anemia - anemia panel Sat 12 and ferritin 12. Will do feraheme AFTER cardiac MRI    6. Hypokalemia/hypomag - Stable.     Plan CMRI today .  Place PICC. Remove swan and leave introducer until PICC placed.  PT consult.    Length of Stay: 4   Cherisse Carrell NP-C  11/02/2022, 7:36 AM  Advanced Heart Failure Team Pager [phone] (M-F; 7a - 4p)  Please contact [facility] for night-coverage after hours (4p -7a ) and weekends on [email or web address]

## 2022-11-02 NOTE — Progress Notes (Signed)
PROGRESS NOTE    Erica Cervantes  PXT:[id number] DOB: 1946/01/08 DOA: 10/29/2022 PCP: Caryl Bis, MD  76/F with history of type 2 diabetes mellitus, hypertension, CKD3a, cognitive deficits, depression, GERD, chronic pain, fibromyalgia presented to [facility] ER with progressive shortness of breath, cough and generalized weakness with significant decline in functional status over the last few months.  Has been laying in bed for the last few months on account of her breathing and over this time developed itching, skin rash, keeps picking at her arms, face etc.  -Chest x-ray with left base collapse,/consolidation with small bilateral pleural effusions, labs with WBC of 11.1, creatinine 1.0, troponin 23, BNP 1377 -Echo at [facility] with EF 15%, moderate MR, preserved RV, moderate pulmonary hypertension, CT chest with large bilateral effusions -Transferred to [facility] -Cardiology consulting, right and left heart cath with severe BiV failure, low output, wedge of 23, minimal nonobstructive CAD -1/19: Started on milrinone and transferred to [facility] -1/20: CVP down, Lasix discontinued, remains on milrinone 1/21: Briefly on norepinephrine infusion for hypotension/shock  Subjective: -Was started on nor epi yesterday afternoon, patient denies any specific complaints, reports being tired, denies dyspnea  Assessment and Plan:  Acute systolic CHF,, BiV failure Cardiogenic shock Mild to moderate MR Large bilateral effusions -New diagnosis, cath with minimal nonobstructive CAD, etiology of cardiomyopathy is unknown, echo EF 15%, moderate MR, preserved RV -RHC with low output, severe BiV failure, started on milrinone 1/19 -Diuresed with IV Lasix, she is 4.1 L negative -CVP down, Lasix discontinued 1/20 -Continue digoxin, spironolactone, GDMT limited by hypotension -Not a great candidate for SGLT2i -CHF team following, plan for cardiac MRI today -Weaning milrinone -May need palliative care eval this  admission  Multiple skin lesions, picking, history of abscesses Mild facial cellulitis -? MRSA folliculitis, poor personal hygiene -IV doxycycline X 2 days, 1 more day -Nasal MRSA PCR positive, >bactroban  Iron deficiency anemia -Anemia panel suggestive of iron deficiency, she did not notice overt bleeding, holding off on IV iron pending cardiac MRI -Hemoglobin drifting down, 7.8 today  Cognitive deficits -awake alert oriented x 2 and appropriate -She is on Aricept at baseline, continued  Type 2 diabetes mellitus -Continue sliding scale insulin for now, A1c 7.7  DVT prophylaxis: lovenox Code Status: Full Code Family Communication: Niece updated at bedside yesterday Disposition Plan: may need placement  Consultants: CHF team   Procedures:   Antimicrobials:    Objective: Vitals:   11/02/22 0500 11/02/22 0530 11/02/22 0600 11/02/22 0740  BP: 99/62  (!) 94/58   Pulse: 98 (!) 102 99 (!) 101  Resp: '15 17 17 13  '$ Temp: 98.1 F (36.7 C) 98.1 F (36.7 C) (!) 97.3 F (36.3 C) 97.7 F (36.5 C)  TempSrc:      SpO2: 97% 98% 92% 98%  Weight:  68 kg    Height:        Intake/Output Summary (Last 24 hours) at 11/02/2022 1119 Last data filed at 11/02/2022 0600 Gross per 24 hour  Intake 814.09 ml  Output 725 ml  Net 89.09 ml   Filed Weights   10/31/22 0500 11/01/22 0410 11/02/22 0530  Weight: 66.4 kg 66 kg 68 kg    Examination:  General exam: Pleasant elderly chronically ill female laying in bed, AAOx3, no distress HEENT: No JVD, multiple areas of excoriation, superficial ulcers on her face  CVS: S1-S2, regular rhythm Lungs: Decreased breath sounds at the bases Abdomen: Soft, nontender, bowel sounds present Extremities: No edema  Skin: No  rashes Psychiatry:  Mood & affect appropriate.     Data Reviewed:   CBC: Recent Labs  Lab 10/30/22 0115 10/30/22 1755 10/30/22 1809 10/30/22 2006 10/31/22 0423 11/01/22 0350 11/02/22 0510  WBC 11.3*  --   --  10.1  12.0* 9.8 8.0  HGB 9.5*   < > 10.5* 8.7* 8.7* 8.4* 7.8*  HCT 32.7*   < > 31.0* 27.8* 29.9* 27.9* 26.1*  MCV 75.0*  --   --  73.0* 75.5* 74.0* 75.2*  PLT 387  --   --  399 383 327 281   < > = values in this interval not displayed.   Basic Metabolic Panel: Recent Labs  Lab 10/29/22 1419 10/30/22 0115 10/30/22 1755 10/30/22 1759 10/30/22 1809 10/30/22 2006 10/31/22 0423 11/01/22 0350 11/02/22 0510  NA 137 136   < > 137  145 137  --  139 135 136  K 4.2 3.9   < > 5.0  4.1 5.0  --  3.5 4.5 4.4  CL 102 102  --   --   --   --  100 97* 102  CO2 23 20*  --   --   --   --  '27 27 28  '$ GLUCOSE 197* 203*  --   --   --   --  113* 225* 180*  BUN 29* 28*  --   --   --   --  28* 29* 26*  CREATININE 1.03* 1.10*  --   --   --  1.37* 1.30* 1.36* 1.08*  CALCIUM 9.3 9.0  --   --   --   --  9.3 8.9 8.6*  MG  --   --   --   --   --   --  1.8 2.2 2.1   < > = values in this interval not displayed.   GFR: Estimated Creatinine Clearance: 40.3 mL/min (A) (by C-G formula based on SCr of 1.08 mg/dL (H)). Liver Function Tests: Recent Labs  Lab 10/30/22 0115  AST 36  ALT 48*  ALKPHOS 96  BILITOT 0.6  PROT 6.0*  ALBUMIN 3.2*   No results for input(s): "LIPASE", "AMYLASE" in the last 168 hours. No results for input(s): "AMMONIA" in the last 168 hours. Coagulation Profile: No results for input(s): "INR", "PROTIME" in the last 168 hours. Cardiac Enzymes: No results for input(s): "CKTOTAL", "CKMB", "CKMBINDEX", "TROPONINI" in the last 168 hours. BNP (last 3 results) No results for input(s): "PROBNP" in the last 8760 hours. HbA1C: No results for input(s): "HGBA1C" in the last 72 hours.  CBG: Recent Labs  Lab 11/01/22 0640 11/01/22 1148 11/01/22 1551 11/01/22 2146 11/02/22 0658  GLUCAP 159* 278* 135* 154* 170*   Lipid Profile: Recent Labs    10/31/22 0423  CHOL 98  HDL 42  LDLCALC 39  TRIG 87  CHOLHDL 2.3   Thyroid Function Tests: Recent Labs    10/31/22 0423  TSH 2.794    Anemia Panel: No results for input(s): "VITAMINB12", "FOLATE", "FERRITIN", "TIBC", "IRON", "RETICCTPCT" in the last 72 hours.  Urine analysis:    Component Value Date/Time   COLORURINE YELLOW 10/29/2022 2230   APPEARANCEUR CLEAR 10/29/2022 2230   LABSPEC 1.012 10/29/2022 2230   PHURINE 5.0 10/29/2022 2230   GLUCOSEU NEGATIVE 10/29/2022 2230   HGBUR NEGATIVE 10/29/2022 2230   BILIRUBINUR NEGATIVE 10/29/2022 2230   KETONESUR NEGATIVE 10/29/2022 2230   PROTEINUR 30 (A) 10/29/2022 2230   NITRITE NEGATIVE 10/29/2022 2230   LEUKOCYTESUR SMALL (A) 10/29/2022  2230   Sepsis Labs: '@LABRCNTIP'$ (procalcitonin:4,lacticidven:4)  ) Recent Results (from the past 240 hour(s))  Culture, blood (Routine X 2) w Reflex to ID Panel     Status: None (Preliminary result)   Collection Time: 10/29/22  9:45 PM   Specimen: BLOOD  Result Value Ref Range Status   Specimen Description BLOOD LEFT ANTECUBITAL  Final   Special Requests   Final    BOTTLES DRAWN AEROBIC AND ANAEROBIC Blood Culture results may not be optimal due to an excessive volume of blood received in culture bottles   Culture   Final    NO GROWTH 4 DAYS Performed at [facility], [place] [address] [place], [place] [address]    Report Status PENDING  Incomplete  Culture, blood (Routine X 2) w Reflex to ID Panel     Status: None (Preliminary result)   Collection Time: 10/29/22  9:45 PM   Specimen: BLOOD  Result Value Ref Range Status   Specimen Description BLOOD LEFT ANTECUBITAL  Final   Special Requests   Final    BOTTLES DRAWN AEROBIC AND ANAEROBIC Blood Culture adequate volume   Culture   Final    NO GROWTH 4 DAYS Performed at [facility], [place] [address], [place], [place] [address]    Report Status PENDING  Incomplete  Resp panel by RT-PCR (RSV, Flu A&B, Covid) Anterior Nasal Swab     Status: None   Collection Time: 10/30/22  1:40 AM   Specimen: Anterior Nasal Swab  Result Value Ref Range Status   SARS Coronavirus 2  by RT PCR NEGATIVE NEGATIVE Final    Comment: (NOTE) SARS-CoV-2 target nucleic acids are NOT DETECTED.  The SARS-CoV-2 RNA is generally detectable in upper respiratory specimens during the acute phase of infection. The lowest concentration of SARS-CoV-2 viral copies this assay can detect is 138 copies/mL. A negative result does not preclude SARS-Cov-2 infection and should not be used as the sole basis for treatment or other patient management decisions. A negative result may occur with  improper specimen collection/handling, submission of specimen other than nasopharyngeal swab, presence of viral mutation(s) within the areas targeted by this assay, and inadequate number of viral copies(<138 copies/mL). A negative result must be combined with clinical observations, patient history, and epidemiological information. The expected result is Negative.  Fact Sheet for Patients:  EntrepreneurPulse.com.au  Fact Sheet for Healthcare Providers:  IncredibleEmployment.be  This test is no t yet approved or cleared by the [place] FDA and  has been authorized for detection and/or diagnosis of SARS-CoV-2 by FDA under an Emergency Use Authorization (EUA). This EUA will remain  in effect (meaning this test can be used) for the duration of the COVID-19 declaration under Section 564(b)(1) of the Act, 21 U.S.C.section 360bbb-3(b)(1), unless the authorization is terminated  or revoked sooner.       Influenza A by PCR NEGATIVE NEGATIVE Final   Influenza B by PCR NEGATIVE NEGATIVE Final    Comment: (NOTE) The Xpert Xpress SARS-CoV-2/FLU/RSV plus assay is intended as an aid in the diagnosis of influenza from Nasopharyngeal swab specimens and should not be used as a sole basis for treatment. Nasal washings and aspirates are unacceptable for Xpert Xpress SARS-CoV-2/FLU/RSV testing.  Fact Sheet for Patients: EntrepreneurPulse.com.au  Fact  Sheet for Healthcare Providers: IncredibleEmployment.be  This test is not yet approved or cleared by the [place] FDA and has been authorized for detection and/or diagnosis of SARS-CoV-2 by FDA under an Emergency Use Authorization (EUA).  This EUA will remain in effect (meaning this test can be used) for the duration of the COVID-19 declaration under Section 564(b)(1) of the Act, 21 U.S.C. section 360bbb-3(b)(1), unless the authorization is terminated or revoked.     Resp Syncytial Virus by PCR NEGATIVE NEGATIVE Final    Comment: (NOTE) Fact Sheet for Patients: EntrepreneurPulse.com.au  Fact Sheet for Healthcare Providers: IncredibleEmployment.be  This test is not yet approved or cleared by the [place] FDA and has been authorized for detection and/or diagnosis of SARS-CoV-2 by FDA under an Emergency Use Authorization (EUA). This EUA will remain in effect (meaning this test can be used) for the duration of the COVID-19 declaration under Section 564(b)(1) of the Act, 21 U.S.C. section 360bbb-3(b)(1), unless the authorization is terminated or revoked.  Performed at [facility], [place] [address], [place], [place] [address]   MRSA Next Gen by PCR, Nasal     Status: Abnormal   Collection Time: 10/30/22  2:37 AM   Specimen: Nasal Mucosa; Nasal Swab  Result Value Ref Range Status   MRSA by PCR Next Gen DETECTED (A) NOT DETECTED Final    Comment: RESULT CALLED TO, READ BACK BY AND VERIFIED WITH: C KETROM,RN'@0438'$  10/30/22 [place] (NOTE) The GeneXpert MRSA Assay (FDA approved for NASAL specimens only), is one component of a comprehensive MRSA colonization surveillance program. It is not intended to diagnose MRSA infection nor to guide or monitor treatment for MRSA infections. Test performance is not FDA approved in patients less than 15 years old. Performed at [facility], [place] [address], [place],   [address]      Radiology Studies: [place] EKG SITE RITE  Result Date: 11/02/2022 If [facility] image not attached, placement could not be confirmed due to current cardiac rhythm.    Scheduled Meds:  aspirin EC  81 mg Oral Daily   [START ON 11/04/2022] atorvastatin  10 mg Oral Weekly   busPIRone  10 mg Oral TID   Chlorhexidine Gluconate Cloth  6 each Topical Daily   digoxin  0.0625 mg Oral Daily   donepezil  5 mg Oral QHS   DULoxetine  60 mg Oral Daily   enoxaparin (LOVENOX) injection  40 mg Subcutaneous E42P   folic acid  1 mg Oral Daily   gabapentin  100 mg Oral BID   insulin aspart  0-5 Units Subcutaneous QHS   insulin aspart  0-9 Units Subcutaneous TID WC   mupirocin ointment   Nasal BID   polyethylene glycol  17 g Oral Daily   senna  1 tablet Oral Daily   sodium chloride flush  3 mL Intravenous Q12H   sodium chloride flush  3 mL Intravenous Q12H   spironolactone  12.5 mg Oral Daily   triamcinolone cream   Topical BID   Continuous Infusions:  sodium chloride     sodium chloride     sodium chloride     sodium chloride Stopped (10/30/22 1948)   sodium chloride     doxycycline (VIBRAMYCIN) IV Stopped (11/02/22 0104)   milrinone 0.25 mcg/kg/min (11/02/22 0806)   norepinephrine (LEVOPHED) Adult infusion       LOS: 4 days    Time spent: 25mn    PDomenic Polite MD [facility]   11/02/2022, 11:19 AM

## 2022-11-03 LAB — COOXEMETRY PANEL
Carboxyhemoglobin: 2.3 % — ABNORMAL HIGH (ref 0.5–1.5)
Methemoglobin: 0.7 % (ref 0.0–1.5)
O2 Saturation: 70.2 %
Total hemoglobin: 7.9 g/dL — ABNORMAL LOW (ref 12.0–16.0)

## 2022-11-03 LAB — CULTURE, BLOOD (ROUTINE X 2)
Culture: NO GROWTH
Culture: NO GROWTH
Special Requests: ADEQUATE

## 2022-11-03 LAB — CBC
HCT: 26.5 % — ABNORMAL LOW (ref 36.0–46.0)
Hemoglobin: 7.5 g/dL — ABNORMAL LOW (ref 12.0–15.0)
MCH: 21.7 pg — ABNORMAL LOW (ref 26.0–34.0)
MCHC: 28.3 g/dL — ABNORMAL LOW (ref 30.0–36.0)
MCV: 76.8 fL — ABNORMAL LOW (ref 80.0–100.0)
Platelets: 280 10*3/uL (ref 150–400)
RBC: 3.45 MIL/uL — ABNORMAL LOW (ref 3.87–5.11)
RDW: 17.2 % — ABNORMAL HIGH (ref 11.5–15.5)
WBC: 7.6 10*3/uL (ref 4.0–10.5)
nRBC: 0 % (ref 0.0–0.2)

## 2022-11-03 LAB — BASIC METABOLIC PANEL
Anion gap: 5 (ref 5–15)
BUN: 25 mg/dL — ABNORMAL HIGH (ref 8–23)
CO2: 26 mmol/L (ref 22–32)
Calcium: 8.5 mg/dL — ABNORMAL LOW (ref 8.9–10.3)
Chloride: 102 mmol/L (ref 98–111)
Creatinine, Ser: 0.95 mg/dL (ref 0.44–1.00)
GFR, Estimated: >60 mL/min (ref >60)
Glucose, Bld: 228 mg/dL — ABNORMAL HIGH (ref 70–99)
Potassium: 4.5 mmol/L (ref 3.5–5.1)
Sodium: 133 mmol/L — ABNORMAL LOW (ref 135–145)

## 2022-11-03 LAB — GLUCOSE, CAPILLARY
Glucose-Capillary: 190 mg/dL — ABNORMAL HIGH (ref 70–99)
Glucose-Capillary: 246 mg/dL — ABNORMAL HIGH (ref 70–99)
Glucose-Capillary: 145 mg/dL — ABNORMAL HIGH (ref 70–99)
Glucose-Capillary: 146 mg/dL — ABNORMAL HIGH (ref 70–99)

## 2022-11-03 LAB — LIPOPROTEIN A (LPA): Lipoprotein (a): 53 nmol/L — ABNORMAL HIGH (ref <75.0)

## 2022-11-03 LAB — MAGNESIUM: Magnesium: 2 mg/dL (ref 1.7–2.4)

## 2022-11-03 MED ORDER — SODIUM CHLORIDE 0.9 % IV SOLN
510.0000 mg | Freq: Once | INTRAVENOUS | Status: AC
  Administered 2022-11-03: 510 mg via INTRAVENOUS
  Filled 2022-11-03: qty 17

## 2022-11-03 MED ORDER — SPIRONOLACTONE 25 MG PO TABS
25.0000 mg | ORAL_TABLET | Freq: Every day | ORAL | Status: DC
  Administered 2022-11-03 – 2022-11-06 (×4): 25 mg via ORAL
  Filled 2022-11-03 (×4): qty 1

## 2022-11-03 MED ORDER — FUROSEMIDE 40 MG PO TABS
40.0000 mg | ORAL_TABLET | Freq: Every day | ORAL | Status: DC
  Administered 2022-11-03: 40 mg via ORAL
  Filled 2022-11-03: qty 1

## 2022-11-03 NOTE — Evaluation (Signed)
Occupational Therapy Evaluation Patient Details Name: Erica Cervantes MRN: [id number] DOB: 04-Dec-1945 Today's Date: 11/03/2022   History of Present Illness Pt is a 77 y.o. F who present 10/29/2022 with acute systolic CHF and large bilateral effusions. Cardiology consulting, right and left heart cath with severe BiV failure. Significant PMH: DM2, HTN, CKD3a, cognitive deficits, chronic pain, fibromyalgia.   Clinical Impression   Erica Cervantes was evaluated s/p the above admission list, she lives at home with a roommate who can assist as needed. Per report she mobilizes with RW intermittent, but has been fairly sedentary since Christmas. She reports independence with ADLs. Upon evaluation she had functional limitations due to poor activity tolerance, poor insight, mild SOB with activity and generalized weakness. Overall she needed min G for transfers and mobility with RW, and standing rest breaks. Due to the deficits listed below, she needs up to min A for ADLs. OT to continue to follow acutely. Recommend d/c home with HHOT and support of roommate/family as needed.      Recommendations for follow up therapy are one component of a multi-disciplinary discharge planning process, led by the attending physician.  Recommendations may be updated based on patient status, additional functional criteria and insurance authorization.   Follow Up Recommendations  Home health OT     Assistance Recommended at Discharge Frequent or constant Supervision/Assistance  Patient can return home with the following A little help with walking and/or transfers;A little help with bathing/dressing/bathroom;Assistance with cooking/housework;Direct supervision/assist for medications management;Direct supervision/assist for financial management;Assist for transportation;Help with stairs or ramp for entrance    Functional Status Assessment  Patient has had a recent decline in their functional status and demonstrates the ability to make  significant improvements in function in a reasonable and predictable amount of time.  Equipment Recommendations  None recommended by OT       Precautions / Restrictions Precautions Precautions: Fall Restrictions Weight Bearing Restrictions: No      Mobility Bed Mobility               General bed mobility comments: OOB in chiar    Transfers Overall transfer level: Needs assistance Equipment used: Rolling walker (2 wheels) Transfers: Sit to/from Stand Sit to Stand: Min guard           General transfer comment: min G - supervision A      Balance Overall balance assessment: Mild deficits observed, not formally tested             ADL either performed or assessed with clinical judgement   ADL Overall ADL's : Needs assistance/impaired Eating/Feeding: Independent   Grooming: Supervision/safety;Standing   Upper Body Bathing: Set up;Sitting   Lower Body Bathing: Minimal assistance;Sit to/from stand   Upper Body Dressing : Set up;Sitting   Lower Body Dressing: Minimal assistance;Sit to/from stand   Toilet Transfer: Min guard;Ambulation;Regular Toilet;Rolling walker (2 wheels)   Toileting- Clothing Manipulation and Hygiene: Supervision/safety;Sitting/lateral lean       Functional mobility during ADLs: Min guard;Rolling walker (2 wheels) General ADL Comments: on RA, standing rest breaks needed during >houeshold mobility     Vision Baseline Vision/History: 1 Wears glasses Vision Assessment?: No apparent visual deficits     Perception Perception Perception Tested?: No   Praxis Praxis Praxis tested?: Not tested    Pertinent Vitals/Pain Pain Assessment Pain Assessment: No/denies pain     Hand Dominance Right   Extremity/Trunk Assessment Upper Extremity Assessment Upper Extremity Assessment: Generalized weakness   Lower Extremity Assessment Lower Extremity Assessment: Generalized  weakness   Cervical / Trunk Assessment Cervical / Trunk  Assessment: Normal   Communication Communication Communication: No difficulties   Cognition Arousal/Alertness: Awake/alert Behavior During Therapy: WFL for tasks assessed/performed Overall Cognitive Status: History of cognitive impairments - at baseline Area of Impairment: Memory                     Memory: Decreased short-term memory         General Comments: Unclear historian; hx of cognitive deficits     General Comments  VSS on RA, pt with complaints of mild SOB with mobility            Home Living Family/patient expects to be discharged to:: Private residence Living Arrangements: Non-relatives/Friends Available Help at Discharge: Personal care attendant;Available 24 hours/day Type of Home: House Home Access: Stairs to enter [employer] of Steps: 1 Entrance Stairs-Rails: Right;Left Home Layout: One level     Bathroom Shower/Tub: Tub/shower unit;Walk-in shower   Bathroom Toilet: Handicapped height Bathroom Accessibility: Yes   Home Equipment: Advice worker (2 wheels);Rollator (4 wheels);Cane - single point;BSC/3in1;Wheelchair Administrator, sports (comment)          Prior Functioning/Environment Prior Level of Function : Needs assist             Mobility Comments: using RW, reports increasingly sedentary since two days before Christmas ADLs Comments: independent ADL's, reports has assist for IADL's        OT Problem List: Decreased strength;Decreased range of motion;Decreased activity tolerance;Impaired balance (sitting and/or standing);Decreased safety awareness;Decreased knowledge of use of DME or AE;Decreased knowledge of precautions;Pain      OT Treatment/Interventions: Self-care/ADL training;Therapeutic exercise;DME and/or AE instruction;Therapeutic activities;Patient/family education;Balance training    OT Goals(Current goals can be found in the care plan section) Acute Rehab OT Goals Patient Stated Goal: home OT  Goal Formulation: With patient Time For Goal Achievement: 11/17/22 Potential to Achieve Goals: Good ADL Goals Pt Will Perform Grooming: with modified independence;standing Pt Will Perform Lower Body Dressing: with supervision;sit to/from stand Pt Will Transfer to Toilet: with modified independence;ambulating Pt Will Perform Toileting - Clothing Manipulation and hygiene: with modified independence;sit to/from stand Additional ADL Goal #1: pt will complete IADL medication management task wtih supervision A  OT Frequency: Min 2X/week       AM-PAC OT "6 Clicks" Daily Activity     Outcome Measure Help from another person eating meals?: None Help from another person taking care of personal grooming?: A Little Help from another person toileting, which includes using toliet, bedpan, or urinal?: A Little Help from another person bathing (including washing, rinsing, drying)?: A Little Help from another person to put on and taking off regular upper body clothing?: A Little Help from another person to put on and taking off regular lower body clothing?: A Little 6 Click Score: 19   End of Session Equipment Utilized During Treatment: Rolling walker (2 wheels) Nurse Communication: Mobility status  Activity Tolerance: Patient tolerated treatment well Patient left: in chair;with chair alarm set  OT Visit Diagnosis: Unsteadiness on feet (R26.81);Other abnormalities of gait and mobility (R26.89);Muscle weakness (generalized) (M62.81);Pain                Time: 4970-2637 OT Time Calculation (min): 19 min Charges:  OT General Charges $OT Visit: 1 Visit OT Evaluation $OT Eval Moderate Complexity: 1 Mod   Marney Treloar D Causey 11/03/2022, 12:01 PM

## 2022-11-03 NOTE — Progress Notes (Addendum)
PROGRESS NOTE    Erica Cervantes  GYI:[id number] DOB: [birth year] DOA: 10/29/2022 PCP: Caryl Bis, MD  76/F with history of type 2 diabetes mellitus, hypertension, CKD3a, cognitive deficits, depression, GERD, chronic pain, fibromyalgia presented to [facility] ER with progressive shortness of breath, cough and generalized weakness with significant decline in functional status over the last few months.  Has been laying in bed for the last few months on account of her breathing and over this time developed itching, skin rash, keeps picking at her arms, face etc.  -Chest x-ray with left base collapse,/consolidation with small bilateral pleural effusions, labs with WBC of 11.1, creatinine 1.0, troponin 23, BNP 1377 -Echo at [facility] with EF 15%, moderate MR, preserved RV, moderate pulmonary hypertension, CT chest with large bilateral effusions -Transferred to [facility] -Cardiology consulting, right and left heart cath with severe BiV failure, low output, wedge of 23, minimal nonobstructive CAD -1/19: Started on milrinone and transferred to [facility] -1/20: CVP down, Lasix discontinued, remains on milrinone 1/21: Briefly on norepinephrine infusion for hypotension/shock 1/22: Weaned off nor epi, cardiac MRI  Subjective: -Mild dyspnea overnight, otherwise improving overall, ambulated with physical therapy yesterday  Assessment and Plan:  Acute systolic CHF,, BiV failure Cardiogenic shock Mild to moderate MR -New diagnosis, cath with minimal nonobstructive CAD, etiology of cardiomyopathy is unknown, echo EF 15%, moderate MR, preserved RV -RHC with low output, severe BiV failure, started on milrinone 1/19 -Diuresed with IV Lasix, she is 4.2 L negative -CVP down, Lasix discontinued 1/20 -Continue digoxin, spironolactone, GDMT limited by hypotension -Not a great candidate for SGLT2i -Off nor epi, weaning milrinone, cardiac MRI completed yesterday -Outpatient palliative care eval-referral  sent -Transfer out of 2H today, PT eval completed, home health recommended  Multiple skin lesions, picking, history of abscesses Mild facial cellulitis -? MRSA folliculitis, poor personal hygiene -IV doxycycline X 2 days, monitor -Nasal MRSA PCR positive, >bactroban  Iron deficiency anemia -Anemia panel suggestive of iron deficiency, she did not notice overt bleeding, -Hemoglobin drifting down, 7.5 today, give IV iron now since cardiac MRI completed  Cognitive deficits -awake alert oriented x 2 and appropriate -She is on Aricept at baseline, continued  Type 2 diabetes mellitus -Continue sliding scale insulin for now, A1c 7.7  DVT prophylaxis: lovenox Code Status: Full Code Family Communication: Niece updated at bedside 1/22 Disposition Plan: Home health services in 1 to 2 days, outpatient palliative care follow-up  Consultants: CHF team   Procedures:   Antimicrobials:    Objective: Vitals:   11/03/22 0915 11/03/22 0930 11/03/22 0945 11/03/22 1000  BP:    103/67  Pulse: 85 93 83 74  Resp: '15 18 13 17  '$ Temp:      TempSrc:      SpO2: 100% 98% 100% 99%  Weight:      Height:        Intake/Output Summary (Last 24 hours) at 11/03/2022 1054 Last data filed at 11/03/2022 1000 Gross per 24 hour  Intake 1040.32 ml  Output 1600 ml  Net -559.68 ml   Filed Weights   11/02/22 0530 11/03/22 0500 11/03/22 0821  Weight: 68 kg 72 kg 68.5 kg    Examination:  General exam: Pleasant chronically ill female appears older than stated age, AAOx3 HEENT: No JVD, multiple areas of excoriation and superficial ulcers on her face CVS: S1-S2, regular rhythm Lungs: Decreased breath sounds to bases Abdomen: Soft, nontender, bowel sounds present Extremities: No edema  Skin: No rashes Psychiatry:  Mood & affect  appropriate.     Data Reviewed:   CBC: Recent Labs  Lab 10/30/22 2006 10/31/22 0423 11/01/22 0350 11/02/22 0510 11/03/22 0505  WBC 10.1 12.0* 9.8 8.0 7.6  HGB 8.7*  8.7* 8.4* 7.8* 7.5*  HCT 27.8* 29.9* 27.9* 26.1* 26.5*  MCV 73.0* 75.5* 74.0* 75.2* 76.8*  PLT 399 383 327 281 062   Basic Metabolic Panel: Recent Labs  Lab 10/30/22 0115 10/30/22 1755 10/30/22 1809 10/30/22 2006 10/31/22 0423 11/01/22 0350 11/02/22 0510 11/03/22 0505  NA 136   < > 137  --  139 135 136 133*  K 3.9   < > 5.0  --  3.5 4.5 4.4 4.5  CL 102  --   --   --  100 97* 102 102  CO2 20*  --   --   --  '27 27 28 26  '$ GLUCOSE 203*  --   --   --  113* 225* 180* 228*  BUN 28*  --   --   --  28* 29* 26* 25*  CREATININE 1.10*  --   --  1.37* 1.30* 1.36* 1.08* 0.95  CALCIUM 9.0  --   --   --  9.3 8.9 8.6* 8.5*  MG  --   --   --   --  1.8 2.2 2.1 2.0   < > = values in this interval not displayed.   GFR: Estimated Creatinine Clearance: 45.9 mL/min (by C-G formula based on SCr of 0.95 mg/dL). Liver Function Tests: Recent Labs  Lab 10/30/22 0115  AST 36  ALT 48*  ALKPHOS 96  BILITOT 0.6  PROT 6.0*  ALBUMIN 3.2*   No results for input(s): "LIPASE", "AMYLASE" in the last 168 hours. No results for input(s): "AMMONIA" in the last 168 hours. Coagulation Profile: No results for input(s): "INR", "PROTIME" in the last 168 hours. Cardiac Enzymes: No results for input(s): "CKTOTAL", "CKMB", "CKMBINDEX", "TROPONINI" in the last 168 hours. BNP (last 3 results) No results for input(s): "PROBNP" in the last 8760 hours. HbA1C: No results for input(s): "HGBA1C" in the last 72 hours.  CBG: Recent Labs  Lab 11/02/22 0658 11/02/22 1133 11/02/22 1620 11/02/22 2217 11/03/22 0629  GLUCAP 170* 248* 134* 168* 190*   Lipid Profile: No results for input(s): "CHOL", "HDL", "LDLCALC", "TRIG", "CHOLHDL", "LDLDIRECT" in the last 72 hours.  Thyroid Function Tests: No results for input(s): "TSH", "T4TOTAL", "FREET4", "T3FREE", "THYROIDAB" in the last 72 hours.  Anemia Panel: No results for input(s): "VITAMINB12", "FOLATE", "FERRITIN", "TIBC", "IRON", "RETICCTPCT" in the last 72  hours.  Urine analysis:    Component Value Date/Time   COLORURINE YELLOW 10/29/2022 2230   APPEARANCEUR CLEAR 10/29/2022 2230   LABSPEC 1.012 10/29/2022 2230   PHURINE 5.0 10/29/2022 2230   GLUCOSEU NEGATIVE 10/29/2022 2230   HGBUR NEGATIVE 10/29/2022 2230   BILIRUBINUR NEGATIVE 10/29/2022 2230   KETONESUR NEGATIVE 10/29/2022 2230   PROTEINUR 30 (A) 10/29/2022 2230   NITRITE NEGATIVE 10/29/2022 2230   LEUKOCYTESUR SMALL (A) 10/29/2022 2230   Sepsis Labs: '@LABRCNTIP'$ (procalcitonin:4,lacticidven:4)  ) Recent Results (from the past 240 hour(s))  Culture, blood (Routine X 2) w Reflex to ID Panel     Status: None   Collection Time: 10/29/22  9:45 PM   Specimen: BLOOD  Result Value Ref Range Status   Specimen Description BLOOD LEFT ANTECUBITAL  Final   Special Requests   Final    BOTTLES DRAWN AEROBIC AND ANAEROBIC Blood Culture results may not be optimal due to an excessive volume of  blood received in culture bottles   Culture   Final    NO GROWTH 5 DAYS Performed at [facility], [place] [address], [place], [place] [address]    Report Status 11/03/2022 FINAL  Final  Culture, blood (Routine X 2) w Reflex to ID Panel     Status: None   Collection Time: 10/29/22  9:45 PM   Specimen: BLOOD  Result Value Ref Range Status   Specimen Description BLOOD LEFT ANTECUBITAL  Final   Special Requests   Final    BOTTLES DRAWN AEROBIC AND ANAEROBIC Blood Culture adequate volume   Culture   Final    NO GROWTH 5 DAYS Performed at [facility], [place] [address] [place], [place] [address]    Report Status 11/03/2022 FINAL  Final  Resp panel by RT-PCR (RSV, Flu A&B, Covid) Anterior Nasal Swab     Status: None   Collection Time: 10/30/22  1:40 AM   Specimen: Anterior Nasal Swab  Result Value Ref Range Status   SARS Coronavirus 2 by RT PCR NEGATIVE NEGATIVE Final    Comment: (NOTE) SARS-CoV-2 target nucleic acids are NOT DETECTED.  The SARS-CoV-2 RNA is generally detectable in upper  respiratory specimens during the acute phase of infection. The lowest concentration of SARS-CoV-2 viral copies this assay can detect is 138 copies/mL. A negative result does not preclude SARS-Cov-2 infection and should not be used as the sole basis for treatment or other patient management decisions. A negative result may occur with  improper specimen collection/handling, submission of specimen other than nasopharyngeal swab, presence of viral mutation(s) within the areas targeted by this assay, and inadequate number of viral copies(<138 copies/mL). A negative result must be combined with clinical observations, patient history, and epidemiological information. The expected result is Negative.  Fact Sheet for Patients:  EntrepreneurPulse.com.au  Fact Sheet for Healthcare Providers:  IncredibleEmployment.be  This test is no t yet approved or cleared by the [place] FDA and  has been authorized for detection and/or diagnosis of SARS-CoV-2 by FDA under an Emergency Use Authorization (EUA). This EUA will remain  in effect (meaning this test can be used) for the duration of the COVID-19 declaration under Section 564(b)(1) of the Act, 21 U.S.C.section 360bbb-3(b)(1), unless the authorization is terminated  or revoked sooner.       Influenza A by PCR NEGATIVE NEGATIVE Final   Influenza B by PCR NEGATIVE NEGATIVE Final    Comment: (NOTE) The Xpert Xpress SARS-CoV-2/FLU/RSV plus assay is intended as an aid in the diagnosis of influenza from Nasopharyngeal swab specimens and should not be used as a sole basis for treatment. Nasal washings and aspirates are unacceptable for Xpert Xpress SARS-CoV-2/FLU/RSV testing.  Fact Sheet for Patients: EntrepreneurPulse.com.au  Fact Sheet for Healthcare Providers: IncredibleEmployment.be  This test is not yet approved or cleared by the [place] FDA and has been  authorized for detection and/or diagnosis of SARS-CoV-2 by FDA under an Emergency Use Authorization (EUA). This EUA will remain in effect (meaning this test can be used) for the duration of the COVID-19 declaration under Section 564(b)(1) of the Act, 21 U.S.C. section 360bbb-3(b)(1), unless the authorization is terminated or revoked.     Resp Syncytial Virus by PCR NEGATIVE NEGATIVE Final    Comment: (NOTE) Fact Sheet for Patients: EntrepreneurPulse.com.au  Fact Sheet for Healthcare Providers: IncredibleEmployment.be  This test is not yet approved or cleared by the [place] FDA and has been authorized for detection and/or diagnosis of SARS-CoV-2  by FDA under an Emergency Use Authorization (EUA). This EUA will remain in effect (meaning this test can be used) for the duration of the COVID-19 declaration under Section 564(b)(1) of the Act, 21 U.S.C. section 360bbb-3(b)(1), unless the authorization is terminated or revoked.  Performed at [facility], [place] [address] [place], [place] [address]   MRSA Next Gen by PCR, Nasal     Status: Abnormal   Collection Time: 10/30/22  2:37 AM   Specimen: Nasal Mucosa; Nasal Swab  Result Value Ref Range Status   MRSA by PCR Next Gen DETECTED (A) NOT DETECTED Final    Comment: RESULT CALLED TO, READ BACK BY AND VERIFIED WITH: C KETROM,RN'@0438'$  10/30/22 [place] (NOTE) The GeneXpert MRSA Assay (FDA approved for NASAL specimens only), is one component of a comprehensive MRSA colonization surveillance program. It is not intended to diagnose MRSA infection nor to guide or monitor treatment for MRSA infections. Test performance is not FDA approved in patients less than 69 years old. Performed at [facility], [place] [address] [place], [place] [address]      Radiology Studies: MR CARDIAC MORPHOLOGY W WO CONTRAST  Result Date: 11/02/2022 CLINICAL DATA:  Cardiomyopathy of uncertain etiology. EXAM:  CARDIAC MRI TECHNIQUE: The patient was scanned on a 1.5 Tesla GE magnet. A dedicated cardiac coil was used. Functional imaging was done using Fiesta sequences. 2,3, and 4 chamber views were done to assess for RWMA's. Modified Simpson's rule using a short axis stack was used to calculate an ejection fraction on a dedicated work Conservation officer, nature. The patient received 9 cc of Gadavist. After 10 minutes inversion recovery sequences were used to assess for infiltration and scar tissue. CONTRAST:  Gadavist 9 cc FINDINGS: Limited images of the lung fields show bilateral moderate pleural effusions with bibasilar atelectasis. Small primarily inferior pericardial effusion. Mild-moderate left ventricular dilation. Normal wall thickness. No LV thrombus. Global LV hypokinesis with EF 17%. Normal right ventricular size with EF 23%. Mild biatrial enlargement. Trileaflet aortic valve with no significant stenosis or regurgitation. Visually, at least moderate mitral regurgitation. Calculated regurgitant fraction from flow sequence is not accurate. On delayed enhancement imaging, there was no myocardial late gadolinium enhancement (LGE). MEASUREMENTS: MEASUREMENTS LVEDV 202 mL LVEDVi 115 mL/m2 LVSV 34 mL LVEF 17% RVEDV 164 mL RVEDVi 94 mL/m2 RVSV 38 mL RVEF 23% Aortic valve forward volume 38 mL Aortic regurgitant fraction 0% T1 1094, ECV 36% IMPRESSION: 1.  Moderate bilateral pleural effusions. 2.  Mild-moderate LV dilation with diffuse hypokinesis, EF 17%. 3.  Normal RV size with EF 23%. 4. At least moderate mitral regurgitation, flow calculated regurgitant fraction is not accurate. 5. No myocardial LGE, so no definitive evidence for prior MI, infiltrative disease, or myocarditis. 6. Elevated extracellular volume percentage at 36%, suggesting increased myocardial fibrosis. This is not in a range that is suggestive of cardiac amyloidosis. Dalton Mclean Electronically Signed   By: Loralie Champagne M.D.   On: 11/02/2022 19:01    [place] EKG SITE RITE  Result Date: 11/02/2022 If Site Rite image not attached, placement could not be confirmed due to current cardiac rhythm.    Scheduled Meds:  aspirin EC  81 mg Oral Daily   [START ON 11/04/2022] atorvastatin  10 mg Oral Weekly   busPIRone  10 mg Oral TID   Chlorhexidine Gluconate Cloth  6 each Topical Daily   digoxin  0.0625 mg Oral Daily   donepezil  5 mg Oral QHS   DULoxetine  60 mg Oral Daily   enoxaparin (LOVENOX) injection  40 mg Subcutaneous X45O   folic acid  1 mg Oral Daily   furosemide  40 mg Oral Daily   gabapentin  100 mg Oral BID   insulin aspart  0-5 Units Subcutaneous QHS   insulin aspart  0-9 Units Subcutaneous TID WC   mupirocin ointment   Nasal BID   polyethylene glycol  17 g Oral Daily   senna  1 tablet Oral Daily   sodium chloride flush  10-40 mL Intracatheter Q12H   sodium chloride flush  3 mL Intravenous Q12H   sodium chloride flush  3 mL Intravenous Q12H   spironolactone  25 mg Oral Daily   triamcinolone cream   Topical BID   Continuous Infusions:  sodium chloride     sodium chloride     sodium chloride     sodium chloride Stopped (10/30/22 1948)   sodium chloride     ferumoxytol (FERAHEME) 510 mg in sodium chloride 0.9 % 100 mL IVPB     milrinone 0.125 mcg/kg/min (11/03/22 1000)     LOS: 5 days    Time spent: 60mn    PDomenic Polite MD [facility]   11/03/2022, 10:54 AM

## 2022-11-03 NOTE — Progress Notes (Signed)
Advanced Heart Failure Rounding Note   Subjective:    Cath 1/19 with mild CAD. RHC c/w cardiogenic shock and severe volume overload 1/22 Milrinone weaned to 0.25 mcg.   Milrinone 0.25 mcg --->  70%   Hgb 7.5--> no obvious source. BM last night.    Complaining of some shortness of breath     Objective:   Weight Range:  Vital Signs:   Temp:  [97.7 F (36.5 C)-100.4 F (38 C)] 97.8 F (36.6 C) (01/23 0416) Pulse Rate:  [94-111] 94 (01/23 0300) Resp:  [13-26] 14 (01/23 0300) BP: (81-128)/(54-92) 114/66 (01/23 0300) SpO2:  [95 %-100 %] 99 % (01/23 0300) Weight:  [72 kg] 72 kg (01/23 0500) Last BM Date : 11/02/22  Weight change: Filed Weights   11/01/22 0410 11/02/22 0530 11/03/22 0500  Weight: 66 kg 68 kg 72 kg    Intake/Output:   Intake/Output Summary (Last 24 hours) at 11/03/2022 0659 Last data filed at 11/03/2022 0300 Gross per 24 hour  Intake 874.68 ml  Output 950 ml  Net -75.32 ml    CVP 8-9  Physical Exam: General:  No resp difficulty HEENT: normal Neck: supple.JVP 7-8 . Carotids 2+ bilat; no bruits. No lymphadenopathy or thryomegaly appreciated. Cor: PMI nondisplaced. Regular rate & rhythm. No rubs, gallops or murmurs. Lungs: Decreased in the bases and some crackles. On 2 liters.  Abdomen: soft, nontender, nondistended. No hepatosplenomegaly. No bruits or masses. Good bowel sounds. Extremities: no cyanosis, clubbing, rash, edema. RUE PICC Neuro: alert & orientedx3, cranial nerves grossly intact. moves all 4 extremities w/o difficulty. Affect pleasant   Telemetry: SR with 1 brief NSVT.   Labs: Basic Metabolic Panel: Recent Labs  Lab 10/30/22 0115 10/30/22 1755 10/30/22 1809 10/30/22 2006 10/31/22 0423 11/01/22 0350 11/02/22 0510 11/03/22 0505  NA 136   < > 137  --  139 135 136 133*  K 3.9   < > 5.0  --  3.5 4.5 4.4 4.5  CL 102  --   --   --  100 97* 102 102  CO2 20*  --   --   --  '27 27 28 26  '$ GLUCOSE 203*  --   --   --  113* 225* 180*  228*  BUN 28*  --   --   --  28* 29* 26* 25*  CREATININE 1.10*  --   --  1.37* 1.30* 1.36* 1.08* 0.95  CALCIUM 9.0  --   --   --  9.3 8.9 8.6* 8.5*  MG  --   --   --   --  1.8 2.2 2.1 2.0   < > = values in this interval not displayed.    Liver Function Tests: Recent Labs  Lab 10/30/22 0115  AST 36  ALT 48*  ALKPHOS 96  BILITOT 0.6  PROT 6.0*  ALBUMIN 3.2*   No results for input(s): "LIPASE", "AMYLASE" in the last 168 hours. No results for input(s): "AMMONIA" in the last 168 hours.  CBC: Recent Labs  Lab 10/30/22 2006 10/31/22 0423 11/01/22 0350 11/02/22 0510 11/03/22 0505  WBC 10.1 12.0* 9.8 8.0 7.6  HGB 8.7* 8.7* 8.4* 7.8* 7.5*  HCT 27.8* 29.9* 27.9* 26.1* 26.5*  MCV 73.0* 75.5* 74.0* 75.2* 76.8*  PLT 399 383 327 281 280    Cardiac Enzymes: No results for input(s): "CKTOTAL", "CKMB", "CKMBINDEX", "TROPONINI" in the last 168 hours.  BNP: BNP (last 3 results) Recent Labs    10/29/22 1419  BNP  1,377.3*    ProBNP (last 3 results) No results for input(s): "PROBNP" in the last 8760 hours.    Other results:  Imaging: MR CARDIAC MORPHOLOGY W WO CONTRAST  Result Date: 11/02/2022 CLINICAL DATA:  Cardiomyopathy of uncertain etiology. EXAM: CARDIAC MRI TECHNIQUE: The patient was scanned on a 1.5 Tesla GE magnet. A dedicated cardiac coil was used. Functional imaging was done using Fiesta sequences. 2,3, and 4 chamber views were done to assess for RWMA's. Modified Simpson's rule using a short axis stack was used to calculate an ejection fraction on a dedicated work Conservation officer, nature. The patient received 9 cc of Gadavist. After 10 minutes inversion recovery sequences were used to assess for infiltration and scar tissue. CONTRAST:  Gadavist 9 cc FINDINGS: Limited images of the lung fields show bilateral moderate pleural effusions with bibasilar atelectasis. Small primarily inferior pericardial effusion. Mild-moderate left ventricular dilation. Normal wall  thickness. No LV thrombus. Global LV hypokinesis with EF 17%. Normal right ventricular size with EF 23%. Mild biatrial enlargement. Trileaflet aortic valve with no significant stenosis or regurgitation. Visually, at least moderate mitral regurgitation. Calculated regurgitant fraction from flow sequence is not accurate. On delayed enhancement imaging, there was no myocardial late gadolinium enhancement (LGE). MEASUREMENTS: MEASUREMENTS LVEDV 202 mL LVEDVi 115 mL/m2 LVSV 34 mL LVEF 17% RVEDV 164 mL RVEDVi 94 mL/m2 RVSV 38 mL RVEF 23% Aortic valve forward volume 38 mL Aortic regurgitant fraction 0% T1 1094, ECV 36% IMPRESSION: 1.  Moderate bilateral pleural effusions. 2.  Mild-moderate LV dilation with diffuse hypokinesis, EF 17%. 3.  Normal RV size with EF 23%. 4. At least moderate mitral regurgitation, flow calculated regurgitant fraction is not accurate. 5. No myocardial LGE, so no definitive evidence for prior MI, infiltrative disease, or myocarditis. 6. Elevated extracellular volume percentage at 36%, suggesting increased myocardial fibrosis. This is not in a range that is suggestive of cardiac amyloidosis. Dalton Mclean Electronically Signed   By: Loralie Champagne M.D.   On: 11/02/2022 19:01   [place] EKG SITE RITE  Result Date: 11/02/2022 If Site Rite image not attached, placement could not be confirmed due to current cardiac rhythm.    Medications:     Scheduled Medications:  aspirin EC  81 mg Oral Daily   [START ON 11/04/2022] atorvastatin  10 mg Oral Weekly   busPIRone  10 mg Oral TID   Chlorhexidine Gluconate Cloth  6 each Topical Daily   digoxin  0.0625 mg Oral Daily   donepezil  5 mg Oral QHS   DULoxetine  60 mg Oral Daily   enoxaparin (LOVENOX) injection  40 mg Subcutaneous D32K   folic acid  1 mg Oral Daily   gabapentin  100 mg Oral BID   insulin aspart  0-5 Units Subcutaneous QHS   insulin aspart  0-9 Units Subcutaneous TID WC   mupirocin ointment   Nasal BID   polyethylene glycol  17 g  Oral Daily   senna  1 tablet Oral Daily   sodium chloride flush  10-40 mL Intracatheter Q12H   sodium chloride flush  3 mL Intravenous Q12H   sodium chloride flush  3 mL Intravenous Q12H   spironolactone  12.5 mg Oral Daily   triamcinolone cream   Topical BID    Infusions:  sodium chloride     sodium chloride     sodium chloride     sodium chloride Stopped (10/30/22 1948)   sodium chloride     milrinone 0.25 mcg/kg/min (11/03/22 0237)  norepinephrine (LEVOPHED) Adult infusion      PRN Medications: sodium chloride, sodium chloride, acetaminophen, melatonin, ondansetron (ZOFRAN) IV, sodium chloride flush, sodium chloride flush   Assessment/Plan   1. Acute systolic HF -> cardiogenic shock - At [facility] echo showed EF 15%, LV mod-sev dilated, LA mildly dilated, RV function normal, mod pulmonary hypertension, mod MR - Cath 10/30/22. Mild CAD. RHC with biventricular failure and cardiogenic shock - On milrinone 0.25. Co-ox 70% -> cut back milrinone to 0.125 mcg.  - CVP 8-9 .   - Start 40 mh po lasix.  - Continue digoxin - Off arb with hypotension.  - Increase spiro to 25 mg daily.   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 - cMRI EF 17% RV EF 23%.  Extracellular volume % 36%.   - Not candidate for advanced therapies with age and comorbidities  CAD - mild by cath 10/30/22 - No chest pain.  - ASA/statin  Bilateral pleural effusions - Seen on CT and CXR 1/18, suspect 2/2 volume overload - Should improve with diuresis   DMII - Hgb A1c 7.7 - Continue SSI per primary - plan for eventual SGLT2i   Microcytic anemia - anemia panel Sat 12 and ferritin 12.  - Given Feraheme  today.  - Plan for GI follow up as an outpatient.    6. Hypokalemia/hypomag - Stable.   Wean milrinone.  OOB. Needs to use IS every hour.  PT recommending [facility] [place]      Length of Stay: 5   Saleha Kalp NP-C  11/03/2022, 6:59 AM  Advanced Heart Failure Team Pager [phone] (M-F;  [place])  Please contact [facility] for night-coverage after hours (4p -7a ) and weekends on [email or web address]

## 2022-11-04 DIAGNOSIS — D509 Iron deficiency anemia, unspecified: Secondary | ICD-10-CM | POA: Diagnosis not present

## 2022-11-04 DIAGNOSIS — N179 Acute kidney failure, unspecified: Secondary | ICD-10-CM

## 2022-11-04 DIAGNOSIS — L03211 Cellulitis of face: Secondary | ICD-10-CM

## 2022-11-04 DIAGNOSIS — I5021 Acute systolic (congestive) heart failure: Secondary | ICD-10-CM | POA: Diagnosis not present

## 2022-11-04 LAB — CBC
HCT: 29.3 % — ABNORMAL LOW (ref 36.0–46.0)
Hemoglobin: 8.4 g/dL — ABNORMAL LOW (ref 12.0–15.0)
MCH: 21.7 pg — ABNORMAL LOW (ref 26.0–34.0)
MCHC: 28.7 g/dL — ABNORMAL LOW (ref 30.0–36.0)
MCV: 75.7 fL — ABNORMAL LOW (ref 80.0–100.0)
Platelets: 299 10*3/uL (ref 150–400)
RBC: 3.87 MIL/uL (ref 3.87–5.11)
RDW: 17.3 % — ABNORMAL HIGH (ref 11.5–15.5)
WBC: 8.2 10*3/uL (ref 4.0–10.5)
nRBC: 0 % (ref 0.0–0.2)

## 2022-11-04 LAB — BASIC METABOLIC PANEL
Anion gap: 7 (ref 5–15)
BUN: 29 mg/dL — ABNORMAL HIGH (ref 8–23)
CO2: 26 mmol/L (ref 22–32)
Calcium: 9.3 mg/dL (ref 8.9–10.3)
Chloride: 104 mmol/L (ref 98–111)
Creatinine, Ser: 0.9 mg/dL (ref 0.44–1.00)
GFR, Estimated: >60 mL/min (ref >60)
Glucose, Bld: 172 mg/dL — ABNORMAL HIGH (ref 70–99)
Potassium: 4.5 mmol/L (ref 3.5–5.1)
Sodium: 137 mmol/L (ref 135–145)

## 2022-11-04 LAB — COOXEMETRY PANEL
Carboxyhemoglobin: 2.2 % — ABNORMAL HIGH (ref 0.5–1.5)
Carboxyhemoglobin: 2.1 % — ABNORMAL HIGH (ref 0.5–1.5)
Methemoglobin: <0.7 % (ref 0.0–1.5)
Methemoglobin: <0.7 % (ref 0.0–1.5)
O2 Saturation: 49.2 %
O2 Saturation: 69.5 %
Total hemoglobin: 8.7 g/dL — ABNORMAL LOW (ref 12.0–16.0)
Total hemoglobin: 9.6 g/dL — ABNORMAL LOW (ref 12.0–16.0)

## 2022-11-04 LAB — GLUCOSE, CAPILLARY
Glucose-Capillary: 181 mg/dL — ABNORMAL HIGH (ref 70–99)
Glucose-Capillary: 219 mg/dL — ABNORMAL HIGH (ref 70–99)
Glucose-Capillary: 149 mg/dL — ABNORMAL HIGH (ref 70–99)
Glucose-Capillary: 159 mg/dL — ABNORMAL HIGH (ref 70–99)

## 2022-11-04 LAB — MAGNESIUM: Magnesium: 2.2 mg/dL (ref 1.7–2.4)

## 2022-11-04 MED ORDER — LOSARTAN POTASSIUM 25 MG PO TABS
12.5000 mg | ORAL_TABLET | Freq: Every day | ORAL | Status: DC
  Administered 2022-11-04 – 2022-11-06 (×3): 12.5 mg via ORAL
  Filled 2022-11-04 (×3): qty 1

## 2022-11-04 MED ORDER — DAPAGLIFLOZIN PROPANEDIOL 10 MG PO TABS
10.0000 mg | ORAL_TABLET | Freq: Every day | ORAL | Status: DC
  Administered 2022-11-04 – 2022-11-06 (×3): 10 mg via ORAL
  Filled 2022-11-04 (×3): qty 1

## 2022-11-04 MED ORDER — FUROSEMIDE 10 MG/ML IJ SOLN
40.0000 mg | Freq: Once | INTRAMUSCULAR | Status: AC
  Administered 2022-11-04: 40 mg via INTRAVENOUS
  Filled 2022-11-04: qty 4

## 2022-11-04 NOTE — Hospital Course (Addendum)
Erica Cervantes was admitted to the hospital with the working diagnosis of heart failure exacerbation.   77 yo female with the past medical history of dementia, CKD stage 3a, T2DM, hypertension, depression and dyslipidemia. 24 hrs prior patient was diagnosed with heart failure with very severe reduction in LV systolic function. In the ED she complained of dyspnea, cough, and generalized weakness. At home she had orthopnea, and bilateral lower extremity edema. On her initial physical examination blood pressure 122/91, HR 105, RR 32, 02 saturation 95%, lungs with no wheezing, heart with S1 and S2 present and rhythmic, abdomen with no distention, no lower extremity edema.   Na 137, K 4.2 Cl 102, bicarbonate 23, glucose 197 bun 29 cr 1.0 BNP 1,377 High sensitive troponin 23 and 28  Wbc 11.1 hgb 9,4 plt 453  Urine analysis SG 1,012, 30 protein, small leukocytes.   Sars covid 19 and influenza negative.   Chest radiograph with cardiomegaly with bilateral hilar vascular congestion and bilateral pleural effusions.  CT chest with bilateral ground glass opacities, with bilateral pleural effusions.   EKG 109 bpm, right axis deviation, normal intervals, sinus rhythm with no significant ST segment or T wave changes.   Patient was placed on furosemide for diuresis.  She had cardiac catheterization, finding patient in severe biventricular failure with low output.  01/19 started on milrinone for inotropic support, in the ICU.  01/21 required norepinephrine for hypotension and shock.  01/22 off norepinephrine 01/23 discontinue milrinone infusion.  01/26 patient has been hemodynamically stable with improving renal function.  Plan to discharge home and have close follow up as outpatient with cardiology.

## 2022-11-04 NOTE — Progress Notes (Signed)
Advanced Heart Failure Rounding Note   Subjective:    Cath 1/19 with mild CAD. RHC c/w cardiogenic shock and severe volume overload>>IV Lasix +Milrinone  cMRI w/ LVEF 17%, RVEF 23%, moderate MR and otherwise myocardial fibrosis but no other findings of infiltrative disease   Milrinone weaned yesterday to 0.125. Co-ox resulted at 49%, though renal fx ok, SCr 0.90. Hgb 8.4.  Repeat co-ox pending.   CVP 12. Only 900 cc in UOP yesterday w/ PO Lasix. Denies dyspnea. Says she felt "weak" overnight.    Objective:   Weight Range:  Vital Signs:   Temp:  [98 F (36.7 C)-98.2 F (36.8 C)] 98 F (36.7 C) (01/24 0305) Pulse Rate:  [74-99] 91 (01/24 0305) Resp:  [13-23] 21 (01/24 0305) BP: (99-129)/(52-78) 111/69 (01/24 0305) SpO2:  [93 %-100 %] 96 % (01/24 0305) Weight:  [68.2 kg-68.5 kg] 68.2 kg (01/24 0439) Last BM Date : 11/02/22  Weight change: Filed Weights   11/03/22 0500 11/03/22 0821 11/04/22 0439  Weight: 72 kg 68.5 kg 68.2 kg    Intake/Output:   Intake/Output Summary (Last 24 hours) at 11/04/2022 0745 Last data filed at 11/04/2022 0300 Gross per 24 hour  Intake 503.41 ml  Output 900 ml  Net -396.59 ml     Physical Exam: CVP 12  General:  elderly WF. NAD  HEENT: normal Neck: supple.JVP 12 . Carotids 2+ bilat; no bruits. No lymphadenopathy or thryomegaly appreciated. Cor: PMI nondisplaced. Regular rate & rhythm. 2/6 MR murmur  Lungs: Decreased BS at the bases bilaterally.  Abdomen: soft, nontender, nondistended. No hepatosplenomegaly. No bruits or masses. Good bowel sounds. Extremities: no cyanosis, clubbing, rash, edema. RUE PICC Neuro: alert & orientedx3, cranial nerves grossly intact. moves all 4 extremities w/o difficulty. Affect pleasant   Telemetry: NSR 90s   Labs: Basic Metabolic Panel: Recent Labs  Lab 10/31/22 0423 11/01/22 0350 11/02/22 0510 11/03/22 0505 11/04/22 0420  NA 139 135 136 133* 137  K 3.5 4.5 4.4 4.5 4.5  CL 100 97* 102 102  104  CO2 '27 27 28 26 26  '$ GLUCOSE 113* 225* 180* 228* 172*  BUN 28* 29* 26* 25* 29*  CREATININE 1.30* 1.36* 1.08* 0.95 0.90  CALCIUM 9.3 8.9 8.6* 8.5* 9.3  MG 1.8 2.2 2.1 2.0 2.2    Liver Function Tests: Recent Labs  Lab 10/30/22 0115  AST 36  ALT 48*  ALKPHOS 96  BILITOT 0.6  PROT 6.0*  ALBUMIN 3.2*   No results for input(s): "LIPASE", "AMYLASE" in the last 168 hours. No results for input(s): "AMMONIA" in the last 168 hours.  CBC: Recent Labs  Lab 10/31/22 0423 11/01/22 0350 11/02/22 0510 11/03/22 0505 11/04/22 0420  WBC 12.0* 9.8 8.0 7.6 8.2  HGB 8.7* 8.4* 7.8* 7.5* 8.4*  HCT 29.9* 27.9* 26.1* 26.5* 29.3*  MCV 75.5* 74.0* 75.2* 76.8* 75.7*  PLT 383 327 281 280 299    Cardiac Enzymes: No results for input(s): "CKTOTAL", "CKMB", "CKMBINDEX", "TROPONINI" in the last 168 hours.  BNP: BNP (last 3 results) Recent Labs    10/29/22 1419  BNP 1,377.3*    ProBNP (last 3 results) No results for input(s): "PROBNP" in the last 8760 hours.    Other results:  Imaging: MR CARDIAC MORPHOLOGY W WO CONTRAST  Result Date: 11/02/2022 CLINICAL DATA:  Cardiomyopathy of uncertain etiology. EXAM: CARDIAC MRI TECHNIQUE: The patient was scanned on a 1.5 Tesla GE magnet. A dedicated cardiac coil was used. Functional imaging was done using Fiesta sequences. 2,3,  and 4 chamber views were done to assess for RWMA's. Modified Simpson's rule using a short axis stack was used to calculate an ejection fraction on a dedicated work Conservation officer, nature. The patient received 9 cc of Gadavist. After 10 minutes inversion recovery sequences were used to assess for infiltration and scar tissue. CONTRAST:  Gadavist 9 cc FINDINGS: Limited images of the lung fields show bilateral moderate pleural effusions with bibasilar atelectasis. Small primarily inferior pericardial effusion. Mild-moderate left ventricular dilation. Normal wall thickness. No LV thrombus. Global LV hypokinesis with EF 17%.  Normal right ventricular size with EF 23%. Mild biatrial enlargement. Trileaflet aortic valve with no significant stenosis or regurgitation. Visually, at least moderate mitral regurgitation. Calculated regurgitant fraction from flow sequence is not accurate. On delayed enhancement imaging, there was no myocardial late gadolinium enhancement (LGE). MEASUREMENTS: MEASUREMENTS LVEDV 202 mL LVEDVi 115 mL/m2 LVSV 34 mL LVEF 17% RVEDV 164 mL RVEDVi 94 mL/m2 RVSV 38 mL RVEF 23% Aortic valve forward volume 38 mL Aortic regurgitant fraction 0% T1 1094, ECV 36% IMPRESSION: 1.  Moderate bilateral pleural effusions. 2.  Mild-moderate LV dilation with diffuse hypokinesis, EF 17%. 3.  Normal RV size with EF 23%. 4. At least moderate mitral regurgitation, flow calculated regurgitant fraction is not accurate. 5. No myocardial LGE, so no definitive evidence for prior MI, infiltrative disease, or myocarditis. 6. Elevated extracellular volume percentage at 36%, suggesting increased myocardial fibrosis. This is not in a range that is suggestive of cardiac amyloidosis. Dalton Mclean Electronically Signed   By: Loralie Champagne M.D.   On: 11/02/2022 19:01     Medications:     Scheduled Medications:  aspirin EC  81 mg Oral Daily   atorvastatin  10 mg Oral Weekly   busPIRone  10 mg Oral TID   Chlorhexidine Gluconate Cloth  6 each Topical Daily   digoxin  0.0625 mg Oral Daily   donepezil  5 mg Oral QHS   DULoxetine  60 mg Oral Daily   enoxaparin (LOVENOX) injection  40 mg Subcutaneous J19E   folic acid  1 mg Oral Daily   furosemide  40 mg Oral Daily   gabapentin  100 mg Oral BID   insulin aspart  0-5 Units Subcutaneous QHS   insulin aspart  0-9 Units Subcutaneous TID WC   mupirocin ointment   Nasal BID   polyethylene glycol  17 g Oral Daily   senna  1 tablet Oral Daily   sodium chloride flush  10-40 mL Intracatheter Q12H   sodium chloride flush  3 mL Intravenous Q12H   sodium chloride flush  3 mL Intravenous Q12H    spironolactone  25 mg Oral Daily   triamcinolone cream   Topical BID    Infusions:  sodium chloride     sodium chloride     sodium chloride     sodium chloride Stopped (10/30/22 1948)   sodium chloride     milrinone 0.125 mcg/kg/min (11/03/22 1100)    PRN Medications: sodium chloride, sodium chloride, acetaminophen, melatonin, ondansetron (ZOFRAN) IV, sodium chloride flush, sodium chloride flush   Assessment/Plan   1. Acute systolic HF -> cardiogenic shock - At [facility] echo showed EF 15%, LV mod-sev dilated, LA mildly dilated, RV function normal, mod pulmonary hypertension, mod MR - Cath 10/30/22. Mild CAD. RHC with biventricular failure and cardiogenic shock - On milrinone 0.125. Co-ox 49%. Repeat Co-ox pending  - CVP 12. Give dose of IV Lasix today after repeat co-ox resulted - Continue  digoxin 0.0625  - Off arb with hypotension.  - Continue spiro  25 mg daily.   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 - cMRI EF 17% RV EF 23%. Extracellular volume 36%. + myocardial fibrosis but no other findings of infiltrative disease  - Not candidate for advanced therapies with age and comorbidities  CAD - mild by cath 10/30/22 - No chest pain.  - ASA/statin  Bilateral pleural effusions - Seen on CT and CXR 1/18, suspect 2/2 volume overload - Should improve with diuresis   DMII - Hgb A1c 7.7 - Continue SSI per primary - plan for eventual SGLT2i   Microcytic anemia - anemia panel Sat 12 and ferritin 12.  - Feraheme given 1/23 - Plan for GI follow up as an outpatient.    6. Hypokalemia/hypomag - Stable.     Length of Stay: [place] PA-C  11/04/2022, 7:45 AM  Advanced Heart Failure Team Pager [phone] (M-F; 7a - 4p)  Please contact [facility] for night-coverage after hours (4p -7a ) and weekends on [email or web address]

## 2022-11-04 NOTE — Assessment & Plan Note (Signed)
Clinically improving. Patient had doxycycline with good response.  Continue close monitoring

## 2022-11-04 NOTE — Assessment & Plan Note (Addendum)
Echocardiogram with significant reduction in systolic function. [facility] with EF 15%, moderate MR, preserved RV, moderate pulmonary hypertension   01/19 cardiac catheterization PA 46/28 mean 35  PCWP 23.  Cardiac output 2.9 and index 1,7 (fick).  PVR 4.2   Severe biventricular failure. Mild non obstructive coronary artery diease.  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   01/21 cardiac MRI with mild to moderate dilatation of LV with diffuse hypokinesis EF 17%, RV with reduced systolic function 82%. Moderate MR. No evidence of infiltrative diease or myocarditis. Positive myocardial fibrosis not in range to suggest amyloidosis.   Her volume status and hemodynamics have improved.  She has a negative volume status, -10,386 ml since admission with significant improvement in her symptoms.   Plan to continue dapagliflozin, digoxin, losartan and spironolactone.  Diuresis with oral furosemide.

## 2022-11-04 NOTE — Assessment & Plan Note (Addendum)
Her renal function has improved, at the time of her discharge serum cr is 1,0 with K at 3,9 and serum bicarbonate at 27. Plan to continue diuresis with furosemide, spironolactone and empagliflozin.  Follow up renal function and electrolytes as outpatient.

## 2022-11-04 NOTE — Assessment & Plan Note (Signed)
Iron deficiency anemia,  SP IV iron. No signs of active bleeding

## 2022-11-04 NOTE — Assessment & Plan Note (Addendum)
Uncontrolled with hyperglycemia with HgA1c at 7.7 Patient required insulin sliding scale for glucose cover and monitoring.  At the time of her discharge her fasting glucose is 165 mg/dl.

## 2022-11-04 NOTE — Progress Notes (Signed)
  Progress Note   Patient: Erica Cervantes RXY:[id number] DOB: 1945-10-26 DOA: 10/29/2022     6 DOS: the patient was seen and examined on 11/04/2022   Brief hospital course: Erica Cervantes was admitted to the hospital with the working diagnosis of heart failure exacerbation.   77 yo female with the past medical history of dementia, CKD stage 3a, T2DM, hypertension, depression and dyslipidemia. 24 hrs prior patient was diagnosed with heart failure with very severe reduction in LV systolic function. In the ED she complained of dyspnea, cough, and generalized weakness. At home had orthopnea, and bilateral lower extremity edema. On her initial physical examination blood pressure 122/91, HR 105, RR 32, 02 saturation 95%, lungs with no wheezing, heart with S1 and S2 present and rhythmic, abdomen with no distention, no lower extremity edema.   Assessment and Plan: * Acute HFrEF (heart failure with reduced ejection fraction) (HCC) Echocardiogram with significant reduction in systolic function.  Urine output is 900 cc (since admission fluid balance negative 7,006 ml).  Systolic blood pressure is 111 to 119 mmHg, SV02 is 69.5   Plan to continue dapagliflozin, digoxin, losartan and spironolactone.  One dose of furosemide IV today.   AKI (acute kidney injury) ([place]) Renal function with serum cr at 0.90 with K at 4,5 and serum bicarbonate at 26. Volume status is improving, follow up renal function in am.   Type 2 diabetes mellitus ([place]) Uncontrolled with hyperglycemia with HgA1c at 7.7 Plan to continue sliding scale for glucose cover and monitoring.   Microcytic anemia Iron deficiency anemia,  SP IV iron. No signs of active bleeding   Facial cellulitis Clinically improving. Patient had doxycycline with good response.  Continue close monitoring         Subjective: Patient is feeling better, continue to be very weak and deconditioned, no chest pain. This am has been at the bedside commode.    Physical Exam: Vitals:   11/04/22 0000 11/04/22 0305 11/04/22 0439 11/04/22 0806  BP: 110/60 111/69  119/69  Pulse: 93 91  92  Resp: 19 (!) 21  20  Temp: 98 F (36.7 C) 98 F (36.7 C)  97.7 F (36.5 C)  TempSrc: Oral Oral  Oral  SpO2: 93% 96%  97%  Weight:   68.2 kg   Height:       Neurology awake and alert  ENT with mild pallor Cardiovascular with S1 and S2 present and rhythmic with no gallops, rubs or murmurs Respiratory with no rales or wheezing, mild decreased breath sounds at bases Abdomen with no distention No lower extremity edema.  Data Reviewed:    Family Communication: no family at th bedside   Disposition: Status is: Inpatient   Planned Discharge Destination:  home    Author: Tawni Millers, MD 11/04/2022 10:08 AM  For on call review [email or web address].

## 2022-11-04 NOTE — Progress Notes (Signed)
Physical Therapy Treatment Patient Details Name: Erica Cervantes MRN: [id number] DOB: [birth year] Today's Date: 11/04/2022   History of Present Illness Pt is a 77 y.o. F who presents 10/29/2022 with acute systolic CHF and large bilateral effusions. Cardiology consulting, right and left heart cath with severe BiV failure. Significant PMH: DM2, HTN, CKD3a, cognitive deficits, chronic pain, fibromyalgia.    PT Comments    Pt was able to ambulate an increased distance of up to ~180 ft and perform x10 serial sit <> stands today. She does fatigue fairly quickly though and demonstrates deficits in muscular strength and power. Educated pt on reducing sodium intake, rinsing canned veggies/fruit, eating frozen or fresh veggies if able, limiting fluid intake, weighing self daily, and increasing frequency of mobility/activity. Will continue to follow acutely. Current recommendations remain appropriate.    Recommendations for follow up therapy are one component of a multi-disciplinary discharge planning process, led by the attending physician.  Recommendations may be updated based on patient status, additional functional criteria and insurance authorization.  Follow Up Recommendations  Home health PT     Assistance Recommended at Discharge PRN  Patient can return home with the following A little help with walking and/or transfers;A little help with bathing/dressing/bathroom;Assistance with cooking/housework;Assist for transportation;Help with stairs or ramp for entrance   Equipment Recommendations  None recommended by PT    Recommendations for Other Services       Precautions / Restrictions Precautions Precautions: Fall Restrictions Weight Bearing Restrictions: No     Mobility  Bed Mobility Overal bed mobility: Needs Assistance Bed Mobility: Supine to Sit     Supine to sit: Supervision, HOB elevated     General bed mobility comments: Supervision for safety    Transfers Overall transfer  level: Needs assistance Equipment used: Rolling walker (2 wheels) Transfers: Sit to/from Stand Sit to Stand: Min guard           General transfer comment: Min guard for safety, no LOB, cues for hand placement. x1 from EOB, x10 from recliner    Ambulation/Gait Ambulation/Gait assistance: Min guard Gait Distance (Feet): 180 Feet Assistive device: Rolling walker (2 wheels) Gait Pattern/deviations: Step-through pattern, Decreased stride length Gait velocity: decreased Gait velocity interpretation: <1.8 ft/sec, indicate of risk for recurrent falls   General Gait Details: Slow and steady pace, min guard for safety. No overt LOB   Stairs             Wheelchair Mobility    Modified Rankin (Stroke Patients Only)       Balance Overall balance assessment: Mild deficits observed, not formally tested                                          Cognition Arousal/Alertness: Awake/alert Behavior During Therapy: WFL for tasks assessed/performed Overall Cognitive Status: History of cognitive impairments - at baseline Area of Impairment: Memory                     Memory: Decreased short-term memory         General Comments: hx of cognitive deficits        Exercises Other Exercises Other Exercises: Sit <> stand 10x from recliner with use of UEs    General Comments General comments (skin integrity, edema, etc.): Educated pt on reducing sodium intake, rinsing canned veggies/fruit, eating frozen or fresh veggies if able, limiting fluid intake,  weighing self daily, and increasing frequency of mobility/activity      Pertinent Vitals/Pain Pain Assessment Pain Assessment: Faces Faces Pain Scale: No hurt Pain Intervention(s): Monitored during session    Home Living                          Prior Function            PT Goals (current goals can now be found in the care plan section) Acute Rehab PT Goals Patient Stated Goal: to  improve PT Goal Formulation: With patient Time For Goal Achievement: 11/16/22 Potential to Achieve Goals: Good Progress towards PT goals: Progressing toward goals    Frequency    Min 3X/week      PT Plan Current plan remains appropriate    Co-evaluation              AM-PAC PT "6 Clicks" Mobility   Outcome Measure  Help needed turning from your back to your side while in a flat bed without using bedrails?: A Little Help needed moving from lying on your back to sitting on the side of a flat bed without using bedrails?: A Little Help needed moving to and from a bed to a chair (including a wheelchair)?: A Little Help needed standing up from a chair using your arms (e.g., wheelchair or bedside chair)?: A Little Help needed to walk in hospital room?: A Little Help needed climbing 3-5 steps with a railing? : A Little 6 Click Score: 18    End of Session Equipment Utilized During Treatment: Gait belt Activity Tolerance: Patient tolerated treatment well Patient left: in chair;with call bell/phone within reach;with chair alarm set   PT Visit Diagnosis: Unsteadiness on feet (R26.81);Muscle weakness (generalized) (M62.81);Difficulty in walking, not elsewhere classified (R26.2);Other abnormalities of gait and mobility (R26.89)     Time: 4695-0722 PT Time Calculation (min) (ACUTE ONLY): 22 min  Charges:  $Therapeutic Activity: 8-22 mins                     Moishe Spice, PT, DPT Acute Rehabilitation Services  Office: [phone]    Orvan Falconer 11/04/2022, 2:19 PM

## 2022-11-05 ENCOUNTER — Other Ambulatory Visit (HOSPITAL_COMMUNITY): Payer: Self-pay

## 2022-11-05 ENCOUNTER — Encounter (HOSPITAL_COMMUNITY): Payer: Self-pay | Admitting: Internal Medicine

## 2022-11-05 DIAGNOSIS — I5023 Acute on chronic systolic (congestive) heart failure: Secondary | ICD-10-CM

## 2022-11-05 LAB — GLUCOSE, CAPILLARY
Glucose-Capillary: 167 mg/dL — ABNORMAL HIGH (ref 70–99)
Glucose-Capillary: 342 mg/dL — ABNORMAL HIGH (ref 70–99)
Glucose-Capillary: 143 mg/dL — ABNORMAL HIGH (ref 70–99)
Glucose-Capillary: 172 mg/dL — ABNORMAL HIGH (ref 70–99)

## 2022-11-05 LAB — BASIC METABOLIC PANEL
Anion gap: 6 (ref 5–15)
BUN: 25 mg/dL — ABNORMAL HIGH (ref 8–23)
CO2: 29 mmol/L (ref 22–32)
Calcium: 9.4 mg/dL (ref 8.9–10.3)
Chloride: 103 mmol/L (ref 98–111)
Creatinine, Ser: 1.29 mg/dL — ABNORMAL HIGH (ref 0.44–1.00)
GFR, Estimated: 42 mL/min — ABNORMAL LOW (ref >60)
Glucose, Bld: 177 mg/dL — ABNORMAL HIGH (ref 70–99)
Potassium: 4 mmol/L (ref 3.5–5.1)
Sodium: 138 mmol/L (ref 135–145)

## 2022-11-05 LAB — CBC
HCT: 31.1 % — ABNORMAL LOW (ref 36.0–46.0)
Hemoglobin: 9.3 g/dL — ABNORMAL LOW (ref 12.0–15.0)
MCH: 22.1 pg — ABNORMAL LOW (ref 26.0–34.0)
MCHC: 29.9 g/dL — ABNORMAL LOW (ref 30.0–36.0)
MCV: 74 fL — ABNORMAL LOW (ref 80.0–100.0)
Platelets: 349 10*3/uL (ref 150–400)
RBC: 4.2 MIL/uL (ref 3.87–5.11)
RDW: 17.3 % — ABNORMAL HIGH (ref 11.5–15.5)
WBC: 9.6 10*3/uL (ref 4.0–10.5)
nRBC: 0 % (ref 0.0–0.2)

## 2022-11-05 LAB — COOXEMETRY PANEL
Carboxyhemoglobin: 1.7 % — ABNORMAL HIGH (ref 0.5–1.5)
Methemoglobin: 0.7 % (ref 0.0–1.5)
O2 Saturation: 57.5 %
Total hemoglobin: 9.4 g/dL — ABNORMAL LOW (ref 12.0–16.0)

## 2022-11-05 LAB — MAGNESIUM: Magnesium: 2.3 mg/dL (ref 1.7–2.4)

## 2022-11-05 MED ORDER — FUROSEMIDE 10 MG/ML IJ SOLN
60.0000 mg | Freq: Once | INTRAMUSCULAR | Status: AC
  Administered 2022-11-05: 60 mg via INTRAVENOUS
  Filled 2022-11-05: qty 6

## 2022-11-05 NOTE — Progress Notes (Signed)
  Progress Note   Patient: Erica Cervantes [id number]:[id number] DOB: Feb 07, 1946 DOA: 10/29/2022     7 DOS: the patient was seen and examined on 11/05/2022   Brief hospital course: Erica Cervantes was admitted to the hospital with the working diagnosis of heart failure exacerbation.   77 yo female with the past medical history of dementia, CKD stage 3a, T2DM, hypertension, depression and dyslipidemia. 24 hrs prior patient was diagnosed with heart failure with very severe reduction in LV systolic function. In the ED she complained of dyspnea, cough, and generalized weakness. At home had orthopnea, and bilateral lower extremity edema. On her initial physical examination blood pressure 122/91, HR 105, RR 32, 02 saturation 95%, lungs with no wheezing, heart with S1 and S2 present and rhythmic, abdomen with no distention, no lower extremity edema.   Assessment and Plan: * Acute on chronic systolic CHF (congestive heart failure) (HCC) Echocardiogram with significant reduction in systolic function.  Urine output is 5573 ml Systolic blood pressure is 105 to 117 mmHg, SV02 is 57.5   Plan to continue dapagliflozin, digoxin, losartan and spironolactone.  One dose of furosemide IV today, 60 mg.   AKI (acute kidney injury) ([place]) Serum cr today is 1,29 with K at 4,0 and serum bicarbonate at 29, Na 138 Mg 2.3 Plan to continue close follow up renal function and electrolytes   Type 2 diabetes mellitus ([place]) Uncontrolled with hyperglycemia with HgA1c at 7.7 Plan to continue sliding scale for glucose cover and monitoring.  Fasting glucose today is 177.  Microcytic anemia Iron deficiency anemia,  SP IV iron. No signs of active bleeding   Facial cellulitis Clinically improving. Patient had doxycycline with good response.  Continue close monitoring         Subjective: Patient had dyspnea last night but better this am, with no chest pain, no lower extremity edema.   Physical Exam: Vitals:   11/05/22 0405  11/05/22 0500 11/05/22 0824 11/05/22 1132  BP: (!) 122/59  136/81 111/67  Pulse: 93 99 94 95  Resp: '18 18 18 20  '$ Temp: 98.4 F (36.9 C)  98.2 F (36.8 C) 97.8 F (36.6 C)  TempSrc: Oral  Oral Axillary  SpO2: 93% 92% 97% 98%  Weight:      Height:       Neurology awake and alert ENT with mild pallor Cardiovascular with S1 and S2 present and rhythmic Respiratory with mild rales with no wheezing or rhonchi Abdomen with no distention No lower extremity edema  Data Reviewed:    Family Communication: no family at the bedside   Disposition: Status is: Inpatient Remains inpatient appropriate because: recovering heart failure with low output   Planned Discharge Destination: Home      Author: Tawni Millers, MD 11/05/2022 11:59 AM  For on call review [email or web address].

## 2022-11-05 NOTE — TOC Benefit Eligibility Note (Signed)
Patient Teacher, English as a foreign language completed.    The patient is currently admitted and upon discharge could be taking Farxiga 10 mg.  The current 30 day co-pay is $0.00.   The patient is insured through [place], [place] Patient Advocate Specialist [place] Patient Advocate Team Direct Number: [phone]  Fax: [phone]

## 2022-11-05 NOTE — Progress Notes (Signed)
   11/05/22 1550  Mobility  Activity Ambulated with assistance in hallway  Level of Assistance Standby assist, set-up cues, supervision of patient - no hands on  Assistive Device Front wheel walker  Distance Ambulated (ft) 550 ft  Activity Response Tolerated well  Mobility Referral Yes  $Mobility charge 1 Mobility   Mobility Specialist Progress Note  Pre-Mobility: 96%SpO2 During Mobility: 86-95% SpO2 Post-Mobility: 117/71 BP, 93% SpO2  Pt was in bed and agreeable. Had c/o lightheadedness after walk but felt better after sitting. Left in bed w/ all needs met and call bell in reach.   Lucious Groves Mobility Specialist  Please contact via SecureChat or Rehab office at [phone]

## 2022-11-05 NOTE — Progress Notes (Signed)
Advanced Heart Failure Rounding Note   Subjective:    Cath 1/19 with mild CAD. RHC c/w cardiogenic shock and severe volume overload>>IV Lasix +Milrinone  cMRI w/ LVEF 17%, RVEF 23%, moderate MR and otherwise myocardial fibrosis but no other findings of infiltrative disease   Milrinone stopped yesterday.   Says she feels weak. + orthopnea. Asking about fluid pushing her eyes out.   Co-ox 58% CVP 8   Objective:   Weight Range:  Vital Signs:   Temp:  [97.7 F (36.5 C)-98.4 F (36.9 C)] 98.4 F (36.9 C) (01/25 0405) Pulse Rate:  [89-99] 99 (01/25 0500) Resp:  [17-22] 18 (01/25 0500) BP: (112-122)/(59-74) 122/59 (01/25 0405) SpO2:  [92 %-99 %] 92 % (01/25 0500) Last BM Date : 11/04/22  Weight change: Filed Weights   11/03/22 0500 11/03/22 0821 11/04/22 0439  Weight: 72 kg 68.5 kg 68.2 kg    Intake/Output:   Intake/Output Summary (Last 24 hours) at 11/05/2022 0721 Last data filed at 11/05/2022 0300 Gross per 24 hour  Intake 715 ml  Output 3750 ml  Net -3035 ml      Physical Exam:  General:  Weak appearing. No resp difficulty HEENT: normal Neck: supple. JVP 8. Carotids 2+ bilat; no bruits. No lymphadenopathy or thryomegaly appreciated. Cor: PMI nondisplaced. Regular rate & rhythm. 2/6 MR Lungs: clear Abdomen: soft, nontender, nondistended. No hepatosplenomegaly. No bruits or masses. Good bowel sounds. Extremities: no cyanosis, clubbing, rash, edema Neuro: alert & orientedx3, cranial nerves grossly intact. moves all 4 extremities w/o difficulty. Affect pleasant   Telemetry: NSR 90-100s Personally reviewed    Labs: Basic Metabolic Panel: Recent Labs  Lab 11/01/22 0350 11/02/22 0510 11/03/22 0505 11/04/22 0420 11/05/22 0156  NA 135 136 133* 137 138  K 4.5 4.4 4.5 4.5 4.0  CL 97* 102 102 104 103  CO2 '27 28 26 26 29  '$ GLUCOSE 225* 180* 228* 172* 177*  BUN 29* 26* 25* 29* 25*  CREATININE 1.36* 1.08* 0.95 0.90 1.29*  CALCIUM 8.9 8.6* 8.5* 9.3 9.4   MG 2.2 2.1 2.0 2.2 2.3     Liver Function Tests: Recent Labs  Lab 10/30/22 0115  AST 36  ALT 48*  ALKPHOS 96  BILITOT 0.6  PROT 6.0*  ALBUMIN 3.2*    No results for input(s): "LIPASE", "AMYLASE" in the last 168 hours. No results for input(s): "AMMONIA" in the last 168 hours.  CBC: Recent Labs  Lab 11/01/22 0350 11/02/22 0510 11/03/22 0505 11/04/22 0420 11/05/22 0156  WBC 9.8 8.0 7.6 8.2 9.6  HGB 8.4* 7.8* 7.5* 8.4* 9.3*  HCT 27.9* 26.1* 26.5* 29.3* 31.1*  MCV 74.0* 75.2* 76.8* 75.7* 74.0*  PLT 327 281 280 299 349     Cardiac Enzymes: No results for input(s): "CKTOTAL", "CKMB", "CKMBINDEX", "TROPONINI" in the last 168 hours.  BNP: BNP (last 3 results) Recent Labs    10/29/22 1419  BNP 1,377.3*     ProBNP (last 3 results) No results for input(s): "PROBNP" in the last 8760 hours.    Other results:  Imaging: No results found.   Medications:     Scheduled Medications:  aspirin EC  81 mg Oral Daily   atorvastatin  10 mg Oral Weekly   busPIRone  10 mg Oral TID   Chlorhexidine Gluconate Cloth  6 each Topical Daily   dapagliflozin propanediol  10 mg Oral Daily   digoxin  0.0625 mg Oral Daily   donepezil  5 mg Oral QHS   DULoxetine  60 mg Oral Daily   enoxaparin (LOVENOX) injection  40 mg Subcutaneous G95A   folic acid  1 mg Oral Daily   gabapentin  100 mg Oral BID   insulin aspart  0-5 Units Subcutaneous QHS   insulin aspart  0-9 Units Subcutaneous TID WC   losartan  12.5 mg Oral Daily   mupirocin ointment   Nasal BID   polyethylene glycol  17 g Oral Daily   senna  1 tablet Oral Daily   sodium chloride flush  10-40 mL Intracatheter Q12H   sodium chloride flush  3 mL Intravenous Q12H   sodium chloride flush  3 mL Intravenous Q12H   spironolactone  25 mg Oral Daily   triamcinolone cream   Topical BID    Infusions:  sodium chloride     sodium chloride     sodium chloride     sodium chloride Stopped (10/30/22 1948)   sodium chloride       PRN Medications: sodium chloride, sodium chloride, acetaminophen, melatonin, ondansetron (ZOFRAN) IV, sodium chloride flush, sodium chloride flush   Assessment/Plan   1. Acute systolic HF -> cardiogenic shock - At [facility] echo showed EF 15%, LV mod-sev dilated, LA mildly dilated, RV function normal, mod pulmonary hypertension, mod MR - Cath 10/30/22. Mild CAD. RHC with biventricular failure and cardiogenic shock - Milrinone stopped yesterday. Co-ox 58% - CVP 8-9 - Continue digoxin 0.0625  - On losartan 12.5 daily (limited by hypotension) - Continue spiro  25 mg daily.   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 - cMRI EF 17% RV EF 23%. Extracellular volume 36%. + myocardial fibrosis but no other findings of infiltrative disease  - Not candidate for advanced therapies with age and comorbidities - Volume status improved but remains symptomatic. SCr 0.9 -> 1.2 - I think this is as probably as good as we are going to be able to get her. Will give one more dose of IV lasix and follow result - If not improving, will need to think about Palliative involvement  CAD - mild by cath 10/30/22 - No s/s angina - ASA/statin  Bilateral pleural effusions - Seen on CT and CXR 1/18, suspect 2/2 volume overload - Should improve with diuresis   DMII - Hgb A1c 7.7 - Continue SSI per primary - plan for eventual SGLT2i   Microcytic anemia - anemia panel Sat 12 and ferritin 12.  - Feraheme given 1/23 - Plan for GI follow up as an outpatient.    6. Hypokalemia/hypomag - Stable.     Length of Stay: 7   Glori Bickers, MD  7:25 AM  Advanced Heart Failure Team Pager [phone] (M-F; 7a - 4p)  Please contact [facility] for night-coverage after hours (4p -7a ) and weekends on [email or web address]

## 2022-11-06 ENCOUNTER — Other Ambulatory Visit (HOSPITAL_COMMUNITY): Payer: Self-pay

## 2022-11-06 DIAGNOSIS — Z515 Encounter for palliative care: Secondary | ICD-10-CM

## 2022-11-06 LAB — BASIC METABOLIC PANEL
Anion gap: 10 (ref 5–15)
BUN: 31 mg/dL — ABNORMAL HIGH (ref 8–23)
CO2: 27 mmol/L (ref 22–32)
Calcium: 9.5 mg/dL (ref 8.9–10.3)
Chloride: 100 mmol/L (ref 98–111)
Creatinine, Ser: 1.09 mg/dL — ABNORMAL HIGH (ref 0.44–1.00)
GFR, Estimated: 52 mL/min — ABNORMAL LOW (ref >60)
Glucose, Bld: 165 mg/dL — ABNORMAL HIGH (ref 70–99)
Potassium: 3.9 mmol/L (ref 3.5–5.1)
Sodium: 137 mmol/L (ref 135–145)

## 2022-11-06 LAB — CBC
HCT: 33.8 % — ABNORMAL LOW (ref 36.0–46.0)
Hemoglobin: 10.1 g/dL — ABNORMAL LOW (ref 12.0–15.0)
MCH: 21.9 pg — ABNORMAL LOW (ref 26.0–34.0)
MCHC: 29.9 g/dL — ABNORMAL LOW (ref 30.0–36.0)
MCV: 73.3 fL — ABNORMAL LOW (ref 80.0–100.0)
Platelets: 395 10*3/uL (ref 150–400)
RBC: 4.61 MIL/uL (ref 3.87–5.11)
RDW: 18.1 % — ABNORMAL HIGH (ref 11.5–15.5)
WBC: 11.6 10*3/uL — ABNORMAL HIGH (ref 4.0–10.5)
nRBC: 0.3 % — ABNORMAL HIGH (ref 0.0–0.2)

## 2022-11-06 LAB — DIGOXIN LEVEL: Digoxin Level: 0.3 ng/mL — ABNORMAL LOW (ref 0.8–2.0)

## 2022-11-06 LAB — COOXEMETRY PANEL
Carboxyhemoglobin: 1.4 % (ref 0.5–1.5)
Methemoglobin: <0.7 % (ref 0.0–1.5)
O2 Saturation: 56.6 %
Total hemoglobin: 10.4 g/dL — ABNORMAL LOW (ref 12.0–16.0)

## 2022-11-06 LAB — GLUCOSE, CAPILLARY
Glucose-Capillary: 168 mg/dL — ABNORMAL HIGH (ref 70–99)
Glucose-Capillary: 227 mg/dL — ABNORMAL HIGH (ref 70–99)

## 2022-11-06 MED ORDER — FUROSEMIDE 40 MG PO TABS: 40.0000 mg | ORAL_TABLET | Freq: Every day | ORAL | Status: DC

## 2022-11-06 MED ORDER — SPIRONOLACTONE 25 MG PO TABS
25.0000 mg | ORAL_TABLET | Freq: Every day | ORAL | 0 refills | Status: AC
  Filled 2022-11-06: qty 30, 30d supply, fill #0

## 2022-11-06 MED ORDER — FUROSEMIDE 20 MG PO TABS
20.0000 mg | ORAL_TABLET | Freq: Every day | ORAL | Status: DC
  Administered 2022-11-06: 20 mg via ORAL
  Filled 2022-11-06: qty 1

## 2022-11-06 MED ORDER — ASPIRIN 81 MG PO TBEC
81.0000 mg | DELAYED_RELEASE_TABLET | Freq: Every day | ORAL | 12 refills | Status: AC
  Filled 2022-11-06: qty 30, 30d supply, fill #0

## 2022-11-06 MED ORDER — FUROSEMIDE 40 MG PO TABS
40.0000 mg | ORAL_TABLET | Freq: Every day | ORAL | 0 refills | Status: DC
  Filled 2022-11-06: qty 30, 30d supply, fill #0

## 2022-11-06 MED ORDER — DAPAGLIFLOZIN PROPANEDIOL 10 MG PO TABS
10.0000 mg | ORAL_TABLET | Freq: Every day | ORAL | 0 refills | Status: AC
  Filled 2022-11-06: qty 30, 30d supply, fill #0

## 2022-11-06 MED ORDER — DIGOXIN 125 MCG PO TABS
0.0625 mg | ORAL_TABLET | Freq: Every day | ORAL | 0 refills | Status: DC
  Filled 2022-11-06: qty 15, 30d supply, fill #0

## 2022-11-06 MED ORDER — LOSARTAN POTASSIUM 25 MG PO TABS
12.5000 mg | ORAL_TABLET | Freq: Every day | ORAL | 0 refills | Status: DC
  Filled 2022-11-06: qty 15, 30d supply, fill #0

## 2022-11-06 NOTE — Progress Notes (Signed)
   Palliative Medicine Inpatient Follow Up Note   Palliative care has acknowledged the consultation for Erica Cervantes. At this time she has discharge orders.  Per secure chat with the primary team, Dr. Cathlean Sauer the consult can be deferred to OP.   A TOC order was placed.  No Charge ______________________________________________________________________________________ [place] Team Team Cell Phone: [phone] Please utilize secure chat with additional questions, if there is no response within 30 minutes please call the above phone number  Palliative Medicine Team providers are available by phone from 7am to 7pm daily and can be reached through the team cell phone.  Should this patient require assistance outside of these hours, please call the patient's attending physician.

## 2022-11-06 NOTE — TOC Initial Note (Signed)
Transition of Care [facility]) - Initial/Assessment Note    Patient Details  Name: RENEKA NEBERGALL MRN: [id number] Date of Birth: [birth year]  Transition of Care [facility]) CM/SW Contact:    Erenest Rasher, RN Phone Number: [phone] 11/06/2022, 1:47 PM  Clinical Narrative:                  HF TOC CM spoke to pt and offered choice for outpt Palliative Services. Pt agreeable to [facility] with new referral for outpt Palliative Services. Contacted [facility] with new referral. Alvis Lemmings accepted referral for [facility]. Pt states she has needed DME at home. Has scale at home for daily weights.   Spoke to pt's dtr, Judeen Hammans, states she will be staying with her mother to assist with care after dc. Explained pt will need to weigh daily.     Expected Discharge Plan: [place] Barriers to Discharge: No Barriers Identified   Patient Goals and CMS Choice Patient states their goals for this hospitalization and ongoing recovery are:: wants to recover CMS Medicare.gov Compare Post Acute Care list provided to:: Patient Choice offered to / list presented to : Patient      Expected Discharge Plan and Services   Discharge Planning Services: CM Consult Post Acute Care Choice: [place] arrangements for the past 2 months: Single Family Home Expected Discharge Date: 11/06/22                         HH Arranged: RN, PT HH Agency: [place] Date [facility] Agency Contacted: 11/06/22 Time HH Agency Contacted: 59 Representative spoke with at [place]: Tommi Rumps  Prior Living Arrangements/Services Living arrangements for the past 2 months: [place] with:: Self   Do you feel safe going back to the place where you live?: Yes      Need for Family Participation in Patient Care: Yes (Comment) Care giver support system in place?: Yes (comment) Current home services: DME (rolling walker, rolling walker with seat, lift  chair, transport chair) Criminal Activity/Legal Involvement Pertinent to Current Situation/Hospitalization: No - Comment as needed  Activities of Daily Living Home Assistive Devices/Equipment: None ADL Screening (condition at time of admission) Patient's cognitive ability adequate to safely complete daily activities?: Yes Is the patient deaf or have difficulty hearing?: No Does the patient have difficulty seeing, even when wearing glasses/contacts?: No Does the patient have difficulty concentrating, remembering, or making decisions?: No Patient able to express need for assistance with ADLs?: Yes Does the patient have difficulty dressing or bathing?: No Independently performs ADLs?: Yes (appropriate for developmental age) Does the patient have difficulty walking or climbing stairs?: No Weakness of Legs: None Weakness of Arms/Hands: None  Permission Sought/Granted Permission sought to share information with : Case Manager, Family Supports, PCP Permission granted to share information with : Yes, Verbal Permission Granted  Share Information with NAME: [place] granted to share info w Relationship: sister  Permission granted to share info w Contact Information: [phone]  Emotional Assessment Appearance:: Appears stated age Attitude/Demeanor/Rapport: Engaged Affect (typically observed): Accepting Orientation: : Oriented to Self, Oriented to Place, Oriented to  Time, Oriented to Situation   Psych Involvement: No (comment)  Admission diagnosis:  Shortness of breath [R06.02] Hypervolemia, unspecified hypervolemia type [E87.70] Community acquired pneumonia, unspecified laterality [J18.9] Acute on chronic HFrEF (heart failure with reduced ejection fraction) (  [place]) [I50.23] Heart failure, unspecified HF chronicity, unspecified heart failure type [facility]) [I50.9] Patient Active Problem List   Diagnosis Date Noted   Acute on chronic systolic CHF (congestive heart  failure) ([place]) 10/29/2022   Pneumonia 10/29/2022   SIRS (systemic inflammatory response syndrome) ([place]) 10/29/2022   Elevated troponin 10/29/2022   Facial cellulitis 10/29/2022   Suprapubic pain 10/29/2022   Microcytic anemia 10/29/2022   AKI (acute kidney injury) ([place]) 10/29/2022   Type 2 diabetes mellitus ([place]) 10/29/2022   Heart failure ([place]) 10/29/2022   Primary osteoarthritis of right hip 02/06/2022   PCP:  Caryl Bis, MD Pharmacy:   [place] [address] [place] [address] Phone: [phone] Fax: [phone]  [facility] - [place], [place] [address] [place] [address] Phone: [phone] Fax: [phone]  [facility] [address] [place] [address] Phone: [phone] Fax: [phone]     Social Determinants of Health (SDOH) Social History: SDOH Screenings   Food Insecurity: No Food Insecurity (10/30/2022)  Housing: Low Risk  (10/30/2022)  Transportation Needs: No Transportation Needs (10/30/2022)  Utilities: Not At Risk (10/30/2022)  Tobacco Use: Low Risk  (11/05/2022)   SDOH Interventions: Food Insecurity Interventions: Intervention Not Indicated Housing Interventions: Intervention Not Indicated Transportation Interventions: Intervention Not Indicated Utilities Interventions: Intervention Not Indicated   Readmission Risk Interventions     No data to display

## 2022-11-06 NOTE — Plan of Care (Signed)
  Problem: Education: Goal: Ability to demonstrate management of disease process will improve Outcome: Progressing Goal: Ability to verbalize understanding of medication therapies will improve Outcome: Progressing Goal: Individualized Educational Video(s) Outcome: Progressing   Problem: Activity: Goal: Capacity to carry out activities will improve Outcome: Progressing   Problem: Cardiac: Goal: Ability to achieve and maintain adequate cardiopulmonary perfusion will improve Outcome: Progressing   Problem: Education: Goal: Ability to describe self-care measures that may prevent or decrease complications (Diabetes Survival Skills Education) will improve Outcome: Progressing Goal: Individualized Educational Video(s) Outcome: Progressing   Problem: Coping: Goal: Ability to adjust to condition or change in health will improve Outcome: Progressing   Problem: Fluid Volume: Goal: Ability to maintain a balanced intake and output will improve Outcome: Progressing   Problem: Health Behavior/Discharge Planning: Goal: Ability to identify and utilize available resources and services will improve Outcome: Progressing Goal: Ability to manage health-related needs will improve Outcome: Progressing   Problem: Metabolic: Goal: Ability to maintain appropriate glucose levels will improve Outcome: Progressing   Problem: Nutritional: Goal: Maintenance of adequate nutrition will improve Outcome: Progressing Goal: Progress toward achieving an optimal weight will improve Outcome: Progressing   Problem: Skin Integrity: Goal: Risk for impaired skin integrity will decrease Outcome: Progressing   Problem: Tissue Perfusion: Goal: Adequacy of tissue perfusion will improve Outcome: Progressing   Problem: Education: Goal: Knowledge of General Education information will improve Description: Including pain rating scale, medication(s)/side effects and non-pharmacologic comfort measures Outcome:  Progressing   Problem: Health Behavior/Discharge Planning: Goal: Ability to manage health-related needs will improve Outcome: Progressing   Problem: Clinical Measurements: Goal: Ability to maintain clinical measurements within normal limits will improve Outcome: Progressing Goal: Will remain free from infection Outcome: Progressing Goal: Diagnostic test results will improve Outcome: Progressing Goal: Respiratory complications will improve Outcome: Progressing Goal: Cardiovascular complication will be avoided Outcome: Progressing   Problem: Activity: Goal: Risk for activity intolerance will decrease Outcome: Progressing   Problem: Nutrition: Goal: Adequate nutrition will be maintained Outcome: Progressing   Problem: Coping: Goal: Level of anxiety will decrease Outcome: Progressing   Problem: Elimination: Goal: Will not experience complications related to bowel motility Outcome: Progressing Goal: Will not experience complications related to urinary retention Outcome: Progressing   Problem: Pain Managment: Goal: General experience of comfort will improve Outcome: Progressing   Problem: Safety: Goal: Ability to remain free from injury will improve Outcome: Progressing   Problem: Skin Integrity: Goal: Risk for impaired skin integrity will decrease Outcome: Progressing   Problem: Education: Goal: Understanding of CV disease, CV risk reduction, and recovery process will improve Outcome: Progressing Goal: Individualized Educational Video(s) Outcome: Progressing   Problem: Activity: Goal: Ability to return to baseline activity level will improve Outcome: Progressing   Problem: Cardiovascular: Goal: Ability to achieve and maintain adequate cardiovascular perfusion will improve Outcome: Progressing Goal: Vascular access site(s) Level 0-1 will be maintained Outcome: Progressing   Problem: Health Behavior/Discharge Planning: Goal: Ability to safely manage  health-related needs after discharge will improve Outcome: Progressing

## 2022-11-06 NOTE — Progress Notes (Addendum)
Advanced Heart Failure Rounding Note   Subjective:    Cath 1/19 with mild CAD. RHC c/w cardiogenic shock and severe volume overload>>IV Lasix +Milrinone  cMRI w/ LVEF 17%, RVEF 23%, moderate MR and otherwise myocardial fibrosis but no other findings of infiltrative disease   Milrinone stopped 1/24.   She feels better today. No complaints. Was able to talk 588f yesterday. Denies CP/SOB.   Co-ox 57% CVP 1. -3.2 L UOP yesterday w/ IV lasix.    Objective:   Weight Range:  Vital Signs:   Temp:  [97.5 F (36.4 C)-98.2 F (36.8 C)] 97.5 F (36.4 C) (01/26 0531) Pulse Rate:  [89-95] 89 (01/26 0531) Resp:  [17-20] 20 (01/26 0531) BP: (103-136)/(66-81) 118/72 (01/26 0531) SpO2:  [89 %-98 %] 89 % (01/26 0531) Weight:  [66.6 kg] 66.6 kg (01/26 0531) Last BM Date : 11/04/22  Weight change: Filed Weights   11/03/22 0821 11/04/22 0439 11/06/22 0531  Weight: 68.5 kg 68.2 kg 66.6 kg    Intake/Output:   Intake/Output Summary (Last 24 hours) at 11/06/2022 0805 Last data filed at 11/06/2022 0500 Gross per 24 hour  Intake 580 ml  Output 3200 ml  Net -2620 ml    Physical Exam: CVP 1 General:  chronically ill, elderly appearing.  No respiratory difficulty HEENT: normal Neck: supple. JVD ~4 cm. Carotids 2+ bilat; no bruits. No lymphadenopathy or thyromegaly appreciated. Cor: PMI nondisplaced. Regular rate & rhythm. No rubs, gallops or murmurs. Lungs: clear Abdomen: soft, nontender, nondistended. No hepatosplenomegaly. No bruits or masses. Good bowel sounds. Extremities: no cyanosis, clubbing, rash, edema. PICC RUE Neuro: alert & oriented x 3, cranial nerves grossly intact. moves all 4 extremities w/o difficulty. Affect pleasant.   Telemetry: NSR 80s (Personally reviewed)      Labs: Basic Metabolic Panel: Recent Labs  Lab 11/01/22 0350 11/02/22 0510 11/03/22 0505 11/04/22 0420 11/05/22 0156 11/06/22 0231  NA 135 136 133* 137 138 137  K 4.5 4.4 4.5 4.5 4.0 3.9  CL  97* 102 102 104 103 100  CO2 '27 28 26 26 29 27  '$ GLUCOSE 225* 180* 228* 172* 177* 165*  BUN 29* 26* 25* 29* 25* 31*  CREATININE 1.36* 1.08* 0.95 0.90 1.29* 1.09*  CALCIUM 8.9 8.6* 8.5* 9.3 9.4 9.5  MG 2.2 2.1 2.0 2.2 2.3  --     Liver Function Tests: No results for input(s): "AST", "ALT", "ALKPHOS", "BILITOT", "PROT", "ALBUMIN" in the last 168 hours. No results for input(s): "LIPASE", "AMYLASE" in the last 168 hours. No results for input(s): "AMMONIA" in the last 168 hours.  CBC: Recent Labs  Lab 11/02/22 0510 11/03/22 0505 11/04/22 0420 11/05/22 0156 11/06/22 0231  WBC 8.0 7.6 8.2 9.6 11.6*  HGB 7.8* 7.5* 8.4* 9.3* 10.1*  HCT 26.1* 26.5* 29.3* 31.1* 33.8*  MCV 75.2* 76.8* 75.7* 74.0* 73.3*  PLT 281 280 299 349 395    Cardiac Enzymes: No results for input(s): "CKTOTAL", "CKMB", "CKMBINDEX", "TROPONINI" in the last 168 hours.  BNP: BNP (last 3 results) Recent Labs    10/29/22 1419  BNP 1,377.3*    ProBNP (last 3 results) No results for input(s): "PROBNP" in the last 8760 hours.    Other results:  Imaging: No results found.   Medications:     Scheduled Medications:  aspirin EC  81 mg Oral Daily   atorvastatin  10 mg Oral Weekly   busPIRone  10 mg Oral TID   Chlorhexidine Gluconate Cloth  6 each Topical Daily  dapagliflozin propanediol  10 mg Oral Daily   digoxin  0.0625 mg Oral Daily   donepezil  5 mg Oral QHS   DULoxetine  60 mg Oral Daily   enoxaparin (LOVENOX) injection  40 mg Subcutaneous Q25Z   folic acid  1 mg Oral Daily   gabapentin  100 mg Oral BID   insulin aspart  0-5 Units Subcutaneous QHS   insulin aspart  0-9 Units Subcutaneous TID WC   losartan  12.5 mg Oral Daily   mupirocin ointment   Nasal BID   polyethylene glycol  17 g Oral Daily   senna  1 tablet Oral Daily   sodium chloride flush  10-40 mL Intracatheter Q12H   sodium chloride flush  3 mL Intravenous Q12H   sodium chloride flush  3 mL Intravenous Q12H   spironolactone  25 mg  Oral Daily   triamcinolone cream   Topical BID    Infusions:  sodium chloride     sodium chloride     sodium chloride     sodium chloride Stopped (10/30/22 1948)   sodium chloride      PRN Medications: sodium chloride, sodium chloride, acetaminophen, melatonin, ondansetron (ZOFRAN) IV, sodium chloride flush, sodium chloride flush   Assessment/Plan   1. Acute systolic HF -> cardiogenic shock - At [facility] echo showed EF 15%, LV mod-sev dilated, LA mildly dilated, RV function normal, mod pulmonary hypertension, mod MR - Cath 10/30/22. Mild CAD. RHC with biventricular failure and cardiogenic shock - Milrinone stopped 1/24. Co-ox 57% - CVP 1, volume appears stable. Diuresed well yesterday with 80 IV lasix x1. Start 40 mg PO lasix today, can adjust as necessary - Continue digoxin 0.0625  - On losartan 12.5 daily (limited by hypotension) - Continue spiro 25 mg daily.   - Continue Farxiga 10 mg daily 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 - cMRI EF 17% RV EF 23%. Extracellular volume 36%. + myocardial fibrosis but no other findings of infiltrative disease  - Not candidate for advanced therapies with age and comorbidities - SCr 0.9 -> 1.2>1.1 - Will consult palliative to review GOC with her  CAD - mild by cath 10/30/22 - No s/s angina - ASA/statin  Bilateral pleural effusions - Seen on CT and CXR 1/18, suspect 2/2 volume overload - Should improve with diuresis   DMII - Hgb A1c 7.7 - Continue SSI per primary - plan for eventual SGLT2i   Microcytic anemia - anemia panel Sat 12 and ferritin 12.  - Feraheme given 1/23 - Plan for GI follow up as an outpatient.    6. Hypokalemia/hypomag - Stable.   Stable for discharge. Will have palliative see her today prior to d/c. Will arrange f/u in AHF clinic  AHF meds at discharge: ASA 81 mg daily Atorvastatin 10 mg daily Farxiga 10 mg daily Digoxin .0625 mg daily Lasix 40 mg daily Losartan 12.5 mg  daily Spironolactone 25 mg daily  Length of Stay: [place], NP  8:05 AM  Advanced Heart Failure Team Pager [phone] (M-F; 7a - 4p)  Please contact [facility] for night-coverage after hours (4p -7a ) and weekends on [email or web address]

## 2022-11-06 NOTE — Discharge Summary (Addendum)
Physician Discharge Summary   Patient: Erica Cervantes MRN: [id number] DOB: 1945-12-09  Admit date:     10/29/2022  Discharge date: 11/06/22  Discharge Physician: [place]   PCP: Caryl Bis, MD   Recommendations at discharge:    Patient has been placed on losartan, digoxin, spironolactone and empagliflozin for heart failure.  Diuresis with furosemide 40 mg daily. Follow up renal function and electrolytes in 7 days.  Follow up with Dr Quillian Quince in 7 to 10 days Follow up with Cardiology as scheduled.  Follow up with Palliative care as outpatient.   Discharge Diagnoses: Principal Problem:   Acute on chronic systolic CHF (congestive heart failure) (HCC) Active Problems:   AKI (acute kidney injury) ([place])   Type 2 diabetes mellitus (HCC)   Microcytic anemia   Facial cellulitis  Resolved Problems:   * No resolved hospital problems. [facility]: [name] was admitted to the hospital with the working diagnosis of heart failure exacerbation.   77 yo female with the past medical history of dementia, CKD stage 3a, T2DM, hypertension, depression and dyslipidemia. 24 hrs prior patient was diagnosed with heart failure with very severe reduction in LV systolic function. In the ED she complained of dyspnea, cough, and generalized weakness. At home she had orthopnea, and bilateral lower extremity edema. On her initial physical examination blood pressure 122/91, HR 105, RR 32, 02 saturation 95%, lungs with no wheezing, heart with S1 and S2 present and rhythmic, abdomen with no distention, no lower extremity edema.   Na 137, K 4.2 Cl 102, bicarbonate 23, glucose 197 bun 29 cr 1.0 BNP 1,377 High sensitive troponin 23 and 28  Wbc 11.1 hgb 9,4 plt 453  Urine analysis SG 1,012, 30 protein, small leukocytes.   Sars covid 19 and influenza negative.   Chest radiograph with cardiomegaly with bilateral hilar vascular congestion and bilateral pleural effusions.  CT chest with  bilateral ground glass opacities, with bilateral pleural effusions.   EKG 109 bpm, right axis deviation, normal intervals, sinus rhythm with no significant ST segment or T wave changes.   Patient was placed on furosemide for diuresis.  She had cardiac catheterization, finding patient in severe biventricular failure with low output.  01/19 started on milrinone for inotropic support, in the ICU.  01/21 required norepinephrine for hypotension and shock.  01/22 off norepinephrine 01/23 discontinue milrinone infusion.  01/26 patient has been hemodynamically stable with improving renal function.  Plan to discharge home and have close follow up as outpatient with cardiology.       Assessment and Plan: * Acute on chronic systolic CHF (congestive heart failure) (HCC) Echocardiogram with significant reduction in systolic function. [facility] with EF 15%, moderate MR, preserved RV, moderate pulmonary hypertension   01/19 cardiac catheterization PA 46/28 mean 35  PCWP 23.  Cardiac output 2.9 and index 1,7 (fick).  PVR 4.2   Severe biventricular failure. Mild non obstructive coronary artery diease.  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   01/21 cardiac MRI with mild to moderate dilatation of LV with diffuse hypokinesis EF 17%, RV with reduced systolic function 31%. Moderate MR. No evidence of infiltrative diease or myocarditis. Positive myocardial fibrosis not in range to suggest amyloidosis.   Her volume status and hemodynamics have improved.  She has a negative volume status, -10,386 ml since admission with significant improvement in her symptoms.   Plan to continue dapagliflozin, digoxin, losartan and spironolactone.  Diuresis with oral  furosemide.   AKI (acute kidney injury) ([place]) Her renal function has improved, at the time of her discharge serum cr is 1,0 with K at 3,9 and serum bicarbonate at 27. Plan to continue diuresis with  furosemide, spironolactone and empagliflozin.  Follow up renal function and electrolytes as outpatient.   Type 2 diabetes mellitus (HCC) Uncontrolled with hyperglycemia with HgA1c at 7.7 Patient required insulin sliding scale for glucose cover and monitoring.  At the time of her discharge her fasting glucose is 165 mg/dl.   Microcytic anemia Iron deficiency anemia,  SP IV iron. No signs of active bleeding   Facial cellulitis Clinically improving. Patient had doxycycline with good response.  Continue close monitoring          Consultants: cardiology  Procedures performed: cardiac catheterization   Disposition: Home Diet recommendation:  Discharge Diet Orders (From admission, onward)     Start     Ordered   11/06/22 0000  Diet - low sodium heart healthy        11/06/22 1216           Cardiac and Carb modified diet DISCHARGE MEDICATION: Allergies as of 11/06/2022       Reactions   Sulfa Antibiotics Shortness Of Breath   Ace Inhibitors Cough   Pt tolerates lisinopril    Codeine Itching, Swelling   Penicillins    Unknown reaction    Statins    Body aches    Tramadol    Numbness    Zetia [ezetimibe]    Myalgia    Neosporin [neomycin-bacitracin Zn-polymyx] Rash        Medication List     STOP taking these medications    lisinopril-hydrochlorothiazide 10-12.5 MG tablet Commonly known as: ZESTORETIC   meloxicam 15 MG tablet Commonly known as: MOBIC   multivitamin with minerals Tabs tablet       TAKE these medications    amphetamine-dextroamphetamine 20 MG tablet Commonly known as: ADDERALL Take 20-40 mg by mouth See admin instructions. Take 40 mg (2 tablets) by mouth in the morning and 20 mg (1 tablet) at 2 pm   aspirin EC 81 MG tablet Take 1 tablet (81 mg total) by mouth daily. Swallow whole. Start taking on: November 07, 2022   atorvastatin 10 MG tablet Commonly known as: LIPITOR Take 10 mg by mouth every Wednesday.   busPIRone 30 MG  tablet Commonly known as: BUSPAR Take 30 mg by mouth 3 (three) times daily.   Cal Mag Zinc +D3 Tabs Take 1 tablet by mouth daily.   CALCIUM 500+D PO Take 1 tablet by mouth daily.   cholecalciferol 25 MCG (1000 UNIT) tablet Commonly known as: VITAMIN D3 Take 2,000 Units by mouth daily.   cycloSPORINE 0.05 % ophthalmic emulsion Commonly known as: RESTASIS Place 2 drops into both eyes 2 (two) times daily.   dapagliflozin propanediol 10 MG Tabs tablet Commonly known as: FARXIGA Take 1 tablet (10 mg total) by mouth daily. Start taking on: November 07, 2022   Digoxin 62.5 MCG Tabs Take 0.0625 mg by mouth daily. Start taking on: November 07, 2022   donepezil 5 MG tablet Commonly known as: ARICEPT Take 5 mg by mouth at bedtime.   DULoxetine 60 MG capsule Commonly known as: CYMBALTA Take 120 mg by mouth daily at 12 noon.   folic acid 517 MCG tablet Commonly known as: FOLVITE Take 800-2,400 mcg by mouth daily.   furosemide 40 MG tablet Commonly known as: LASIX Take 1 tablet (40 mg  total) by mouth daily. Start taking on: November 07, 2022   losartan 25 MG tablet Commonly known as: COZAAR Take 0.5 tablets (12.5 mg total) by mouth daily. Start taking on: November 07, 2022   metFORMIN 500 MG 24 hr tablet Commonly known as: GLUCOPHAGE-XR Take 1,000 mg by mouth 2 (two) times daily.   Mucinex Maximum Strength 1200 MG Tb12 Generic drug: Guaifenesin Take 1,200 mg by mouth daily as needed (severe congestion).   omeprazole 20 MG capsule Commonly known as: PRILOSEC Take 20 mg by mouth daily.   PROBIOTIC PO Take 1 capsule by mouth daily.   spironolactone 25 MG tablet Commonly known as: ALDACTONE Take 1 tablet (25 mg total) by mouth daily. Start taking on: November 07, 2022   triamcinolone cream 0.1 % Commonly known as: KENALOG Apply 1 application. topically 3 (three) times daily as needed for itching or rash.        Follow-up Information     [facility] [place] Follow up on 11/12/2022.   Specialty: Cardiology Why: 11/12/21 at 11:30 AM   [facility] [place] information: [address] [id number] [place]               Discharge Exam: Danley Danker Weights   11/03/22 7846 11/04/22 0439 11/06/22 0531  Weight: 68.5 kg 68.2 kg 66.6 kg   BP 118/72 (BP Location: Left Arm)   Pulse 89   Temp (!) 97.5 F (36.4 C) (Oral)   Resp 20   Ht 5' 3.5" (1.613 m)   Wt 66.6 kg   SpO2 (!) 89%   BMI 25.60 kg/m   Patient is feeling better, she has been out of bed to chair, no edema or chest pain  Neurology awake and alert ENT with mild pallor Cardiovascular with S1 and S2 present and rhythmic with no gallops, rubs or murmurs Respiratory with no rales or wheezing Abdomen with no distention No lower extremity edema   Condition at discharge: stable  The results of significant diagnostics from this hospitalization (including imaging, microbiology, ancillary and laboratory) are listed below for reference.   Imaging Studies: MR CARDIAC MORPHOLOGY W WO CONTRAST  Result Date: 11/02/2022 CLINICAL DATA:  Cardiomyopathy of uncertain etiology. EXAM: CARDIAC MRI TECHNIQUE: The patient was scanned on a 1.5 Tesla GE magnet. A dedicated cardiac coil was used. Functional imaging was done using Fiesta sequences. 2,3, and 4 chamber views were done to assess for RWMA's. Modified Simpson's rule using a short axis stack was used to calculate an ejection fraction on a dedicated work Conservation officer, nature. The patient received 9 cc of Gadavist. After 10 minutes inversion recovery sequences were used to assess for infiltration and scar tissue. CONTRAST:  Gadavist 9 cc FINDINGS: Limited images of the lung fields show bilateral moderate pleural effusions with bibasilar atelectasis. Small primarily inferior pericardial effusion. Mild-moderate  left ventricular dilation. Normal wall thickness. No LV thrombus. Global LV hypokinesis with EF 17%. Normal right ventricular size with EF 23%. Mild biatrial enlargement. Trileaflet aortic valve with no significant stenosis or regurgitation. Visually, at least moderate mitral regurgitation. Calculated regurgitant fraction from flow sequence is not accurate. On delayed enhancement imaging, there was no myocardial late gadolinium enhancement (LGE). MEASUREMENTS: MEASUREMENTS LVEDV 202 mL LVEDVi 115 mL/m2 LVSV 34 mL LVEF 17% RVEDV 164 mL RVEDVi 94 mL/m2 RVSV 38 mL RVEF 23% Aortic valve forward volume 38 mL Aortic regurgitant fraction  0% T1 1094, ECV 36% IMPRESSION: 1.  Moderate bilateral pleural effusions. 2.  Mild-moderate LV dilation with diffuse hypokinesis, EF 17%. 3.  Normal RV size with EF 23%. 4. At least moderate mitral regurgitation, flow calculated regurgitant fraction is not accurate. 5. No myocardial LGE, so no definitive evidence for prior MI, infiltrative disease, or myocarditis. 6. Elevated extracellular volume percentage at 36%, suggesting increased myocardial fibrosis. This is not in a range that is suggestive of cardiac amyloidosis. Dalton Mclean Electronically Signed   By: Loralie Champagne M.D.   On: 11/02/2022 19:01   [place] EKG SITE RITE  Result Date: 11/02/2022 If Site Rite image not attached, placement could not be confirmed due to current cardiac rhythm.  Port CXR  Result Date: 10/30/2022 CLINICAL DATA:  Central line placement EXAM: PORTABLE CHEST 1 VIEW COMPARISON:  Portable exam 1908 hours compared to 10/29/2022 FINDINGS: RIGHT jugular Swan-Ganz catheter with tip projecting over RIGHT pulmonary artery at hilum. Borderline enlargement of cardiac silhouette. Mediastinal contours and pulmonary vascularity normal. Bibasilar effusions and atelectasis. Minimal perihilar edema. No pneumothorax. IMPRESSION: RIGHT jugular Swan-Ganz catheter without pneumothorax. Probable mild pulmonary edema with  bibasilar effusions and atelectasis. Electronically Signed   By: Lavonia Dana M.D.   On: 10/30/2022 19:21   CARDIAC CATHETERIZATION  Result Date: 10/30/2022   Prox Cx to Mid Cx lesion is 30% stenosed.   Ramus lesion is 50% stenosed. Findings: Ao = 87/60 (72) LV = 93/24 RA =  15 RV = 46/18 PA = 46/28 (35) PCW = 23 Fick cardiac output/index = 2.9/1.7 Thermo CO/CI = 2.0/1.1 SVR = 1572 (Fick)  2280 (Thermo) PVR = 4.2 (Fick) 6.1 (TD) FA sat = 99% PA sat = 37%, 39% PAPi = 1.2 Assessment: 1. 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 Mild non-obstructive CAD Plan/Discussion: Leave swan in. Transfer to CCU. Start milrinone +/- NE for support. Will need cMRI. Not candidate for advance therapies with age and comorbidities. Glori Bickers, MD 6:48 PM  CT CHEST WO CONTRAST  Result Date: 10/29/2022 CLINICAL DATA:  Shortness of breath and weakness, increasing left basilar infiltrate on recent chest x-ray EXAM: CT CHEST WITHOUT CONTRAST TECHNIQUE: Multidetector CT imaging of the chest was performed following the standard protocol without IV contrast. RADIATION DOSE REDUCTION: This exam was performed according to the departmental dose-optimization program which includes automated exposure control, adjustment of the mA and/or kV according to patient size and/or use of iterative reconstruction technique. COMPARISON:  Chest x-ray from earlier the same day. FINDINGS: Cardiovascular: Somewhat limited due to lack of IV contrast. Atherosclerotic calcifications of the aorta are noted. No cardiac enlargement is seen. Pulmonary artery is not significantly enlarged. Mediastinum/Nodes: Thoracic inlet is within normal limits. No hilar or mediastinal adenopathy is noted. The esophagus as visualized is within normal limits. Lungs/Pleura: Large bilateral pleural effusions are noted with underlying lower lobe atelectatic changes. These have worsened in the interval from the prior exam. The remainder of the aerated lung  appears within normal limits. Upper Abdomen: Visualized upper abdomen is within normal limits. Gallbladder has been surgically removed. Musculoskeletal: Degenerative changes of the thoracic spine are noted. No acute rib abnormality is noted. IMPRESSION: Large bilateral pleural effusions with underlying lower lobe atelectatic changes. This has worsened in the interval from the prior exam particularly on the right. Aortic Atherosclerosis (ICD10-I70.0). Electronically Signed   By: Inez Catalina M.D.   On: 10/29/2022 23:21   DG Chest 2 View  Result Date: 10/29/2022 CLINICAL DATA:  Shortness  of breath with weakness and nonproductive cough. EXAM: CHEST - 2 VIEW COMPARISON:  10/20/2022 FINDINGS: Low volume film. The cardio pericardial silhouette is enlarged. Left base collapse/consolidation is mildly progressive in the interval and there is a small left pleural effusion. Likely tiny right pleural effusion. No findings to suggest pulmonary edema. The visualized bony structures of the thorax are unremarkable. IMPRESSION: Interval progression of left base collapse/consolidation with small bilateral pleural effusions. Electronically Signed   By: Misty Stanley M.D.   On: 10/29/2022 15:29    Microbiology: Results for orders placed or performed during the hospital encounter of 10/29/22  Culture, blood (Routine X 2) w Reflex to ID Panel     Status: None   Collection Time: 10/29/22  9:45 PM   Specimen: BLOOD  Result Value Ref Range Status   Specimen Description BLOOD LEFT ANTECUBITAL  Final   Special Requests   Final    BOTTLES DRAWN AEROBIC AND ANAEROBIC Blood Culture results may not be optimal due to an excessive volume of blood received in culture bottles   Culture   Final    NO GROWTH 5 DAYS Performed at [facility], [place] [address] [place], [place] [address]    Report Status 11/03/2022 FINAL  Final  Culture, blood (Routine X 2) w Reflex to ID Panel     Status: None   Collection Time: 10/29/22  9:45  PM   Specimen: BLOOD  Result Value Ref Range Status   Specimen Description BLOOD LEFT ANTECUBITAL  Final   Special Requests   Final    BOTTLES DRAWN AEROBIC AND ANAEROBIC Blood Culture adequate volume   Culture   Final    NO GROWTH 5 DAYS Performed at [facility], [place] [address] [place], [place] [address]    Report Status 11/03/2022 FINAL  Final  Resp panel by RT-PCR (RSV, Flu A&B, Covid) Anterior Nasal Swab     Status: None   Collection Time: 10/30/22  1:40 AM   Specimen: Anterior Nasal Swab  Result Value Ref Range Status   SARS Coronavirus 2 by RT PCR NEGATIVE NEGATIVE Final    Comment: (NOTE) SARS-CoV-2 target nucleic acids are NOT DETECTED.  The SARS-CoV-2 RNA is generally detectable in upper respiratory specimens during the acute phase of infection. The lowest concentration of SARS-CoV-2 viral copies this assay can detect is 138 copies/mL. A negative result does not preclude SARS-Cov-2 infection and should not be used as the sole basis for treatment or other patient management decisions. A negative result may occur with  improper specimen collection/handling, submission of specimen other than nasopharyngeal swab, presence of viral mutation(s) within the areas targeted by this assay, and inadequate number of viral copies(<138 copies/mL). A negative result must be combined with clinical observations, patient history, and epidemiological information. The expected result is Negative.  Fact Sheet for Patients:  EntrepreneurPulse.com.au  Fact Sheet for Healthcare Providers:  IncredibleEmployment.be  This test is no t yet approved or cleared by the [place] FDA and  has been authorized for detection and/or diagnosis of SARS-CoV-2 by FDA under an Emergency Use Authorization (EUA). This EUA will remain  in effect (meaning this test can be used) for the duration of the COVID-19 declaration under Section 564(b)(1) of the Act,  21 U.S.C.section 360bbb-3(b)(1), unless the authorization is terminated  or revoked sooner.       Influenza A by PCR NEGATIVE NEGATIVE Final   Influenza B by PCR NEGATIVE NEGATIVE Final    Comment: (NOTE)  The Xpert Xpress SARS-CoV-2/FLU/RSV plus assay is intended as an aid in the diagnosis of influenza from Nasopharyngeal swab specimens and should not be used as a sole basis for treatment. Nasal washings and aspirates are unacceptable for Xpert Xpress SARS-CoV-2/FLU/RSV testing.  Fact Sheet for Patients: EntrepreneurPulse.com.au  Fact Sheet for Healthcare Providers: IncredibleEmployment.be  This test is not yet approved or cleared by the [place] FDA and has been authorized for detection and/or diagnosis of SARS-CoV-2 by FDA under an Emergency Use Authorization (EUA). This EUA will remain in effect (meaning this test can be used) for the duration of the COVID-19 declaration under Section 564(b)(1) of the Act, 21 U.S.C. section 360bbb-3(b)(1), unless the authorization is terminated or revoked.     Resp Syncytial Virus by PCR NEGATIVE NEGATIVE Final    Comment: (NOTE) Fact Sheet for Patients: EntrepreneurPulse.com.au  Fact Sheet for Healthcare Providers: IncredibleEmployment.be  This test is not yet approved or cleared by the [place] FDA and has been authorized for detection and/or diagnosis of SARS-CoV-2 by FDA under an Emergency Use Authorization (EUA). This EUA will remain in effect (meaning this test can be used) for the duration of the COVID-19 declaration under Section 564(b)(1) of the Act, 21 U.S.C. section 360bbb-3(b)(1), unless the authorization is terminated or revoked.  Performed at [facility], [place] [address], [place], [place] [address]   MRSA Next Gen by PCR, Nasal     Status: Abnormal   Collection Time: 10/30/22  2:37 AM   Specimen: Nasal Mucosa; Nasal Swab  Result  Value Ref Range Status   MRSA by PCR Next Gen DETECTED (A) NOT DETECTED Final    Comment: RESULT CALLED TO, READ BACK BY AND VERIFIED WITH: C KETROM,RN'@[id number]'$  10/30/22 [place] (NOTE) The GeneXpert MRSA Assay (FDA approved for NASAL specimens only), is one component of a comprehensive MRSA colonization surveillance program. It is not intended to diagnose MRSA infection nor to guide or monitor treatment for MRSA infections. Test performance is not FDA approved in patients less than 38 years old. Performed at [facility], [place] [address] [place], [place] [address]     Labs: CBC: Recent Labs  Lab 11/02/22 0510 11/03/22 0505 11/04/22 0420 11/05/22 0156 11/06/22 0231  WBC 8.0 7.6 8.2 9.6 11.6*  HGB 7.8* 7.5* 8.4* 9.3* 10.1*  HCT 26.1* 26.5* 29.3* 31.1* 33.8*  MCV 75.2* 76.8* 75.7* 74.0* 73.3*  PLT 281 280 299 349 948   Basic Metabolic Panel: Recent Labs  Lab 11/01/22 0350 11/02/22 0510 11/03/22 0505 11/04/22 0420 11/05/22 0156 11/06/22 0231  NA 135 136 133* 137 138 137  K 4.5 4.4 4.5 4.5 4.0 3.9  CL 97* 102 102 104 103 100  CO2 '27 28 26 26 29 27  '$ GLUCOSE 225* 180* 228* 172* 177* 165*  BUN 29* 26* 25* 29* 25* 31*  CREATININE 1.36* 1.08* 0.95 0.90 1.29* 1.09*  CALCIUM 8.9 8.6* 8.5* 9.3 9.4 9.5  MG 2.2 2.1 2.0 2.2 2.3  --    Liver Function Tests: No results for input(s): "AST", "ALT", "ALKPHOS", "BILITOT", "PROT", "ALBUMIN" in the last 168 hours. CBG: Recent Labs  Lab 11/05/22 1131 11/05/22 1729 11/05/22 2102 11/06/22 0602 11/06/22 1207  GLUCAP 342* 143* 172* 168* 227*    Discharge time spent: greater than 30 minutes.  Signed: Tawni Millers, MD [facility] 11/06/2022

## 2022-11-06 NOTE — Progress Notes (Signed)
Daughter Judeen Hammans at bedside.  Discharge instructions given to the patient and her daughter.  Both verbalized understanding.  TOC delivered meds to bedside, went through medications with patient and daughter, meds all accounted for.  IV team dc'd right upper arm PICC line, tolerated well by patient.  Needs addressed.  Discharged home.

## 2022-11-06 NOTE — Progress Notes (Signed)
Physical Therapy Treatment Patient Details Name: Erica Cervantes MRN: [id number] DOB: 1945-10-23 Today's Date: 11/06/2022   History of Present Illness Pt is a 77 y.o. F who presents 10/29/2022 with acute systolic CHF and large bilateral effusions. Cardiology consulting, right and left heart cath with severe BiV failure. Significant PMH: DM2, HTN, CKD3a, cognitive deficits, chronic pain, fibromyalgia.    PT Comments    Patient progressing with distance with ambulation and tolerating without signs of dyspnea.  Eager for home soon and should be stable from mobility standpoint when medically ready.  Still recommending HHPT for HEP and safety eval.  Will follow up if not d/c.    Recommendations for follow up therapy are one component of a multi-disciplinary discharge planning process, led by the attending physician.  Recommendations may be updated based on patient status, additional functional criteria and insurance authorization.  Follow Up Recommendations  Home health PT     Assistance Recommended at Discharge PRN  Patient can return home with the following Assistance with cooking/housework;Assist for transportation;Help with stairs or ramp for entrance   Equipment Recommendations  None recommended by PT    Recommendations for Other Services       Precautions / Restrictions Precautions Precautions: Fall     Mobility  Bed Mobility Overal bed mobility: Needs Assistance Bed Mobility: Supine to Sit     Supine to sit: Supervision     General bed mobility comments: assist for lines    Transfers Overall transfer level: Needs assistance Equipment used: Rolling walker (2 wheels) Transfers: Sit to/from Stand Sit to Stand: Supervision                Ambulation/Gait Ambulation/Gait assistance: Supervision Gait Distance (Feet): 400 Feet Assistive device: Rolling walker (2 wheels) Gait Pattern/deviations: Step-through pattern, Decreased stride length       General Gait  Details: no LOB or SOB noted   Stairs             Wheelchair Mobility    Modified Rankin (Stroke Patients Only)       Balance Overall balance assessment: Mild deficits observed, not formally tested                                          Cognition Arousal/Alertness: Awake/alert Behavior During Therapy: WFL for tasks assessed/performed Overall Cognitive Status: History of cognitive impairments - at baseline Area of Impairment: Memory                                        Exercises      General Comments General comments (skin integrity, edema, etc.): VSS with mobility on RA      Pertinent Vitals/Pain Pain Assessment Pain Assessment: Faces Faces Pain Scale: Hurts a little bit Pain Location: buttocks Pain Descriptors / Indicators: Sore Pain Intervention(s): Repositioned, Monitored during session, Other (comment) (NT made aware needs sacral pad)    Home Living                          Prior Function            PT Goals (current goals can now be found in the care plan section) Progress towards PT goals: Progressing toward goals    Frequency    Min  3X/week      PT Plan      Co-evaluation              AM-PAC PT "6 Clicks" Mobility   Outcome Measure  Help needed turning from your back to your side while in a flat bed without using bedrails?: None Help needed moving from lying on your back to sitting on the side of a flat bed without using bedrails?: None Help needed moving to and from a bed to a chair (including a wheelchair)?: None Help needed standing up from a chair using your arms (e.g., wheelchair or bedside chair)?: A Little Help needed to walk in hospital room?: A Little Help needed climbing 3-5 steps with a railing? : Total 6 Click Score: 19    End of Session Equipment Utilized During Treatment: Gait belt Activity Tolerance: Patient tolerated treatment well Patient left: in chair;with  call bell/phone within reach;with chair alarm set   PT Visit Diagnosis: Unsteadiness on feet (R26.81);Muscle weakness (generalized) (M62.81);Difficulty in walking, not elsewhere classified (R26.2);Other abnormalities of gait and mobility (R26.89)     Time: 7793-9030 PT Time Calculation (min) (ACUTE ONLY): 20 min  Charges:  $Gait Training: 8-22 mins                     Magda Kiel, PT Acute Rehabilitation Services [phone] 11/06/2022    [name] 11/06/2022, 12:59 PM

## 2022-11-06 NOTE — Care Management Important Message (Signed)
Important Message  Patient Details  Name: Erica Cervantes MRN: [id number] Date of Birth: 1945-11-23   Medicare Important Message Given:  Yes     Shelda Altes 11/06/2022, 8:49 AM

## 2022-11-08 DIAGNOSIS — Z96641 Presence of right artificial hip joint: Secondary | ICD-10-CM | POA: Diagnosis not present

## 2022-11-08 DIAGNOSIS — N179 Acute kidney failure, unspecified: Secondary | ICD-10-CM | POA: Diagnosis not present

## 2022-11-08 DIAGNOSIS — D631 Anemia in chronic kidney disease: Secondary | ICD-10-CM | POA: Diagnosis not present

## 2022-11-08 DIAGNOSIS — K219 Gastro-esophageal reflux disease without esophagitis: Secondary | ICD-10-CM | POA: Diagnosis not present

## 2022-11-08 DIAGNOSIS — I2510 Atherosclerotic heart disease of native coronary artery without angina pectoris: Secondary | ICD-10-CM | POA: Diagnosis not present

## 2022-11-08 DIAGNOSIS — I340 Nonrheumatic mitral (valve) insufficiency: Secondary | ICD-10-CM | POA: Diagnosis not present

## 2022-11-08 DIAGNOSIS — Z7984 Long term (current) use of oral hypoglycemic drugs: Secondary | ICD-10-CM | POA: Diagnosis not present

## 2022-11-08 DIAGNOSIS — I5023 Acute on chronic systolic (congestive) heart failure: Secondary | ICD-10-CM | POA: Diagnosis not present

## 2022-11-08 DIAGNOSIS — I700 Atherosclerosis of aorta: Secondary | ICD-10-CM | POA: Diagnosis not present

## 2022-11-08 DIAGNOSIS — I130 Hypertensive heart and chronic kidney disease with heart failure and stage 1 through stage 4 chronic kidney disease, or unspecified chronic kidney disease: Secondary | ICD-10-CM | POA: Diagnosis not present

## 2022-11-08 DIAGNOSIS — E1142 Type 2 diabetes mellitus with diabetic polyneuropathy: Secondary | ICD-10-CM | POA: Diagnosis not present

## 2022-11-08 DIAGNOSIS — I272 Pulmonary hypertension, unspecified: Secondary | ICD-10-CM | POA: Diagnosis not present

## 2022-11-08 DIAGNOSIS — I2781 Cor pulmonale (chronic): Secondary | ICD-10-CM | POA: Diagnosis not present

## 2022-11-08 DIAGNOSIS — F32A Depression, unspecified: Secondary | ICD-10-CM | POA: Diagnosis not present

## 2022-11-08 DIAGNOSIS — E1122 Type 2 diabetes mellitus with diabetic chronic kidney disease: Secondary | ICD-10-CM | POA: Diagnosis not present

## 2022-11-08 DIAGNOSIS — F0393 Unspecified dementia, unspecified severity, with mood disturbance: Secondary | ICD-10-CM | POA: Diagnosis not present

## 2022-11-08 DIAGNOSIS — D509 Iron deficiency anemia, unspecified: Secondary | ICD-10-CM | POA: Diagnosis not present

## 2022-11-08 DIAGNOSIS — I429 Cardiomyopathy, unspecified: Secondary | ICD-10-CM | POA: Diagnosis not present

## 2022-11-08 DIAGNOSIS — N184 Chronic kidney disease, stage 4 (severe): Secondary | ICD-10-CM | POA: Diagnosis not present

## 2022-11-08 DIAGNOSIS — E876 Hypokalemia: Secondary | ICD-10-CM | POA: Diagnosis not present

## 2022-11-08 DIAGNOSIS — Z7982 Long term (current) use of aspirin: Secondary | ICD-10-CM | POA: Diagnosis not present

## 2022-11-08 DIAGNOSIS — E785 Hyperlipidemia, unspecified: Secondary | ICD-10-CM | POA: Diagnosis not present

## 2022-11-08 DIAGNOSIS — I5082 Biventricular heart failure: Secondary | ICD-10-CM | POA: Diagnosis not present

## 2022-11-10 ENCOUNTER — Ambulatory Visit (HOSPITAL_COMMUNITY)
Admission: RE | Admit: 2022-11-10 | Discharge: 2022-11-10 | Disposition: A | Discharge status: Home / Self Care | Payer: PPO | Source: Ambulatory Visit | Attending: Family Medicine | Admitting: Family Medicine

## 2022-11-10 ENCOUNTER — Telehealth: Payer: Self-pay

## 2022-11-10 ENCOUNTER — Encounter (HOSPITAL_COMMUNITY): Payer: Self-pay

## 2022-11-10 VITALS — BP 130/78 | HR 87 | Wt 140.0 lb

## 2022-11-10 DIAGNOSIS — G8929 Other chronic pain: Secondary | ICD-10-CM | POA: Insufficient documentation

## 2022-11-10 DIAGNOSIS — E1122 Type 2 diabetes mellitus with diabetic chronic kidney disease: Secondary | ICD-10-CM | POA: Diagnosis not present

## 2022-11-10 DIAGNOSIS — Z7984 Long term (current) use of oral hypoglycemic drugs: Secondary | ICD-10-CM | POA: Diagnosis not present

## 2022-11-10 DIAGNOSIS — E1169 Type 2 diabetes mellitus with other specified complication: Secondary | ICD-10-CM

## 2022-11-10 DIAGNOSIS — D509 Iron deficiency anemia, unspecified: Secondary | ICD-10-CM | POA: Diagnosis not present

## 2022-11-10 DIAGNOSIS — I5022 Chronic systolic (congestive) heart failure: Secondary | ICD-10-CM | POA: Insufficient documentation

## 2022-11-10 DIAGNOSIS — I130 Hypertensive heart and chronic kidney disease with heart failure and stage 1 through stage 4 chronic kidney disease, or unspecified chronic kidney disease: Secondary | ICD-10-CM | POA: Insufficient documentation

## 2022-11-10 DIAGNOSIS — Z79899 Other long term (current) drug therapy: Secondary | ICD-10-CM | POA: Insufficient documentation

## 2022-11-10 DIAGNOSIS — M797 Fibromyalgia: Secondary | ICD-10-CM | POA: Diagnosis not present

## 2022-11-10 DIAGNOSIS — I2510 Atherosclerotic heart disease of native coronary artery without angina pectoris: Secondary | ICD-10-CM

## 2022-11-10 DIAGNOSIS — R4189 Other symptoms and signs involving cognitive functions and awareness: Secondary | ICD-10-CM | POA: Diagnosis not present

## 2022-11-10 DIAGNOSIS — R9431 Abnormal electrocardiogram [ECG] [EKG]: Secondary | ICD-10-CM | POA: Diagnosis not present

## 2022-11-10 DIAGNOSIS — I5023 Acute on chronic systolic (congestive) heart failure: Secondary | ICD-10-CM

## 2022-11-10 DIAGNOSIS — N184 Chronic kidney disease, stage 4 (severe): Secondary | ICD-10-CM | POA: Insufficient documentation

## 2022-11-10 DIAGNOSIS — K219 Gastro-esophageal reflux disease without esophagitis: Secondary | ICD-10-CM | POA: Insufficient documentation

## 2022-11-10 DIAGNOSIS — J90 Pleural effusion, not elsewhere classified: Secondary | ICD-10-CM | POA: Insufficient documentation

## 2022-11-10 DIAGNOSIS — Z7982 Long term (current) use of aspirin: Secondary | ICD-10-CM | POA: Diagnosis not present

## 2022-11-10 LAB — DIGOXIN LEVEL: Digoxin Level: 0.7 ng/mL — ABNORMAL LOW (ref 0.8–2.0)

## 2022-11-10 LAB — BASIC METABOLIC PANEL
Anion gap: 10 (ref 5–15)
BUN: 31 mg/dL — ABNORMAL HIGH (ref 8–23)
CO2: 26 mmol/L (ref 22–32)
Calcium: 10.1 mg/dL (ref 8.9–10.3)
Chloride: 100 mmol/L (ref 98–111)
Creatinine, Ser: 1.2 mg/dL — ABNORMAL HIGH (ref 0.44–1.00)
GFR, Estimated: 46 mL/min — ABNORMAL LOW (ref >60)
Glucose, Bld: 125 mg/dL — ABNORMAL HIGH (ref 70–99)
Potassium: 4.1 mmol/L (ref 3.5–5.1)
Sodium: 136 mmol/L (ref 135–145)

## 2022-11-10 LAB — BRAIN NATRIURETIC PEPTIDE: B Natriuretic Peptide: 466.5 pg/mL — ABNORMAL HIGH (ref 0.0–100.0)

## 2022-11-10 MED ORDER — LOSARTAN POTASSIUM 25 MG PO TABS: 25.0000 mg | ORAL_TABLET | Freq: Every evening | ORAL | 8 refills | Status: DC

## 2022-11-10 NOTE — Patient Instructions (Addendum)
Thank you for coming in today  INCREASE Losartan 25 mg 1 tablet nightly  Your physician recommends that you return for lab work in:  10 days BMET  Your physician recommends that you schedule a follow-up appointment in:  3-4 weeks in clinic 3 months with Dr. Haroldine Laws with echocardiogram  Your physician has requested that you have an echocardiogram. Echocardiography is a painless test that uses sound waves to create images of your heart. It provides your doctor with information about the size and shape of your heart and how well your heart's chambers and valves are working. This procedure takes approximately one hour. There are no restrictions for this procedure.       Do the following things EVERYDAY: Weigh yourself in the morning before breakfast. Write it down and keep it in a log. Take your medicines as prescribed Eat low salt foods--Limit salt (sodium) to 2000 mg per day.  Stay as active as you can everyday Limit all fluids for the day to less than 2 liters   At the [facility], you and your health needs are our priority. As part of our continuing mission to provide you with exceptional heart care, we have created designated Provider Care Teams. These Care Teams include your primary Cardiologist (physician) and Advanced Practice Providers (APPs- Physician Assistants and Nurse Practitioners) who all work together to provide you with the care you need, when you need it.   You may see any of the following providers on your designated Care Team at your next follow up: Dr Glori Bickers Dr Loralie Champagne Dr. Roxana Hires, NP Lyda Jester, [place] [facility] [place], [place] Forestine Na, NP Audry Riles, PharmD   Please be sure to bring in all your medications bottles to every appointment.    Thank you for choosing [facility]   If you have any questions or concerns before your next appointment  please send [place] a message through [place] or call our office at [phone].    TO LEAVE A MESSAGE FOR THE NURSE SELECT OPTION 2, PLEASE LEAVE A MESSAGE INCLUDING: YOUR NAME DATE OF BIRTH CALL BACK NUMBER REASON FOR CALL**this is important as we prioritize the call backs  YOU WILL RECEIVE A CALL BACK THE SAME DAY AS LONG AS YOU CALL BEFORE 4:00 PM

## 2022-11-10 NOTE — Telephone Encounter (Signed)
(  11:23 am) PC SW left a message for patient's sister Georgie Chard requesting a call back to discuss palliative care services.

## 2022-11-10 NOTE — Progress Notes (Signed)
ADVANCED HF CLINIC CONSULT NOTE  Primary Care: Caryl Bis, MD HF Cardiologist: Dr. Haroldine Laws  HPI: [name] is a 77 y.o. female with DMII, CKD, HTN, cognitive deficits, GERD, chronic pain, and fibromyalgia.   She was admitted to [facility] with SOB, fatigue and chest tightness. Echo showed EF 15% and she was transferred to [facility] for further workup. She was diuresed with IV lasix and underwent R/LHC showing mild CAD, severe volume overload and hemodynamics consistent with CS. Started on IV lasix and milrinone. cMRI showed 17%, RVEF 23%, moderate MR and otherwise myocardial fibrosis, but no other findings of infiltrative disease. Drips weaned and GDMT titrated, hospitalization complicated by anemia and bilateral pleural effusions. She was discharged home, weight 147 lbs.  Today she returns for post hospital HF follow up with her friend and caregiver. Overall feeling fine. She has SOB with walking further distances on flat ground, fatigued with ADLs. She has occasional dizziness, no falls. She uses a cane when out of the house. Denies palpitations, CP, abnormal bleeding, edema, or PND/Orthopnea. She sleeps reclined in hospital bed. Appetite ok. No fever or chills. Weight at home 134-140 pounds. Taking all medications. No tobacco use/rare ETOH use. Asking if she can restart her Adderall.  Some family cardiac history but she doesn't recall exactly what. Sister has a fib and her son has something cardiac related for which he's on meds.   Cardiac Studies  - cMRI (1/24): LVEF 17%, RVEF 23%, moderate MR (regurgitant fraction not accurate), no LGE, elevated EVC suggesting myocardial fibrosis but not suggestive of cardiac amyloidosis  - R/LHC (1/24):    Prox Cx to Mid Cx lesion is 30% stenosed.   Ramus lesion is 50% stenosed. RA =  15 PA = 46/28 (35) PCW = 23 CO/CI (Fick) = 2.9/1.7 CO/CI (thermo) = 2.0/1.1 SVR = 1572 (Fick)  2280 (Thermo) PVR = 4.2 (Fick) 6.1 (TD) PAPi = 1.2   - Echo  (1/24) at [facility]: EF 15%, RV normal, mild to moderate central MR, moderate pulmonary HTN  Review of Systems: [y] = yes, [ ]$  = no   General: Weight gain [ ]$ ; Weight loss [ ]$ ; Anorexia [ ]$ ; Fatigue Blue.Reese ]; Fever [ ]$ ; Chills [ ]$ ; Weakness [ ]$   Cardiac: Chest pain/pressure [ ]$ ; Resting SOB [ ]$ ; Exertional SOB Blue.Reese ]; Orthopnea [ ]$ ; Pedal Edema [ ]$ ; Palpitations [ ]$ ; Syncope [ ]$ ; Presyncope [ ]$ ; Paroxysmal nocturnal dyspnea[ ]$   Pulmonary: Cough [ ]$ ; Wheezing[ ]$ ; Hemoptysis[ ]$ ; Sputum [ ]$ ; Snoring [ ]$   GI: Vomiting[ ]$ ; Dysphagia[ ]$ ; Melena[ ]$ ; Hematochezia [ ]$ ; Heartburn[ ]$ ; Abdominal pain [ ]$ ; Constipation [ ]$ ; Diarrhea [ ]$ ; BRBPR [ ]$   GU: Hematuria[ ]$ ; Dysuria [ ]$ ; Nocturia[ ]$   Vascular: Pain in legs with walking [ ]$ ; Pain in feet with lying flat [ ]$ ; Non-healing sores [ ]$ ; Stroke [ ]$ ; TIA [ ]$ ; Slurred speech [ ]$ ;  Neuro: Headaches[ ]$ ; Vertigo[ ]$ ; Seizures[ ]$ ; Paresthesias[ ]$ ;Blurred vision [ ]$ ; Diplopia [ ]$ ; Vision changes [ ]$   Ortho/Skin: Arthritis [ ]$ ; Joint pain [ ]$ ; Muscle pain [ ]$ ; Joint swelling [ ]$ ; Back Pain [ ]$ ; Rash [ ]$   Psych: Depression[ ]$ ; Anxiety[ ]$   Heme: Bleeding problems [ ]$ ; Clotting disorders [ ]$ ; Anemia Blue.Reese ]  Endocrine: Diabetes [ ]$ ; Thyroid dysfunction[ ]$    Past Medical History:  Diagnosis Date   Chronic kidney disease    stage 4   Depression    Diabetes mellitus without  complication (HCC)    GERD (gastroesophageal reflux disease)    Hypertension    Neuromuscular disorder (HCC)    peripheral neuropathy    Current Outpatient Medications  Medication Sig Dispense Refill   aspirin EC 81 MG tablet Take 1 tablet (81 mg total) by mouth daily. Swallow whole. 30 tablet 12   atorvastatin (LIPITOR) 10 MG tablet Take 10 mg by mouth every Wednesday.     busPIRone (BUSPAR) 30 MG tablet Take 30 mg by mouth 3 (three) times daily.     Calcium Carb-Cholecalciferol (CALCIUM 500+D PO) Take 1 tablet by mouth daily.     cholecalciferol (VITAMIN D3) 25 MCG (1000 UNIT) tablet Take 2,000 Units  by mouth daily.     cycloSPORINE (RESTASIS) 0.05 % ophthalmic emulsion Place 2 drops into both eyes 2 (two) times daily.     dapagliflozin propanediol (FARXIGA) 10 MG TABS tablet Take 1 tablet (10 mg total) by mouth daily. 30 tablet 0   digoxin (LANOXIN) 0.125 MG tablet Take 0.5 tablets (0.0625 mg total) by mouth daily. 15 tablet 0   donepezil (ARICEPT) 5 MG tablet Take 5 mg by mouth at bedtime.     DULoxetine (CYMBALTA) 60 MG capsule Take 120 mg by mouth daily at 12 noon.     folic acid (FOLVITE) 614 MCG tablet Take 800-2,400 mcg by mouth daily.     furosemide (LASIX) 40 MG tablet Take 1 tablet (40 mg total) by mouth daily. 30 tablet 0   Guaifenesin (MUCINEX MAXIMUM STRENGTH) 1200 MG TB12 Take 1,200 mg by mouth daily as needed (severe congestion).     metFORMIN (GLUCOPHAGE-XR) 500 MG 24 hr tablet Take 1,000 mg by mouth 2 (two) times daily.     Multiple Minerals-Vitamins (CAL MAG ZINC +D3) TABS Take 1 tablet by mouth daily.     omeprazole (PRILOSEC) 20 MG capsule Take 20 mg by mouth daily.     Probiotic Product (PROBIOTIC PO) Take 1 capsule by mouth daily.     spironolactone (ALDACTONE) 25 MG tablet Take 1 tablet (25 mg total) by mouth daily. 30 tablet 0   triamcinolone cream (KENALOG) 0.1 % Apply 1 application. topically 3 (three) times daily as needed for itching or rash.     amphetamine-dextroamphetamine (ADDERALL) 20 MG tablet Take 20-40 mg by mouth See admin instructions. Take 40 mg (2 tablets) by mouth in the morning and 20 mg (1 tablet) at 2 pm (Patient not taking: Reported on 11/10/2022)     losartan (COZAAR) 25 MG tablet Take 1 tablet (25 mg total) by mouth at bedtime. 30 tablet 8   No current facility-administered medications for this encounter.    Allergies  Allergen Reactions   Sulfa Antibiotics Shortness Of Breath   Ace Inhibitors Cough    Pt tolerates lisinopril    Codeine Itching and Swelling   Penicillins     Unknown reaction     Statins     Body aches    Tramadol      Numbness    Zetia [Ezetimibe]     Myalgia    Neosporin [Neomycin-Bacitracin Zn-Polymyx] Rash      Social History   Socioeconomic History   Marital status: Single    Spouse name: Not on file   Number of children: Not on file   Years of education: Not on file   Highest education level: Not on file  Occupational History   Not on file  Tobacco Use   Smoking status: Never   Smokeless tobacco: Never  Vaping Use   Vaping Use: Never used  Substance and Sexual Activity   Alcohol use: No   Drug use: No   Sexual activity: Not on file  Other Topics Concern   Not on file  Social History Narrative   Not on file   Social Determinants of Health   Financial Resource Strain: Not on file  Food Insecurity: No Food Insecurity (10/30/2022)   Hunger Vital Sign    Worried About Running Out of Food in the Last Year: Never true    Ran Out of Food in the Last Year: Never true  Transportation Needs: No Transportation Needs (10/30/2022)   PRAPARE - Hydrologist (Medical): No    Lack of Transportation (Non-Medical): No  Physical Activity: Not on file  Stress: Not on file  Social Connections: Not on file  Intimate Partner Violence: Not At Risk (10/30/2022)   Humiliation, Afraid, Rape, and Kick questionnaire    Fear of Current or Ex-Partner: No    Emotionally Abused: No    Physically Abused: No    Sexually Abused: No   Family History  Problem Relation Age of Onset   Cancer Mother    Cancer Father    Heart attack Brother    Polycystic kidney disease Brother    Cancer Other    BP 130/78   Pulse 87   Wt 63.5 kg (140 lb)   SpO2 99%   BMI 24.41 kg/m   Wt Readings from Last 3 Encounters:  11/10/22 63.5 kg (140 lb)  11/06/22 66.6 kg (146 lb 13.2 oz)  03/04/22 65.8 kg (145 lb)   PHYSICAL EXAM: General:  NAD. No resp difficulty, walked into clinic with cane, elderly HEENT: Normal Neck: Supple. No JVD. Carotids 2+ bilat; no bruits. No lymphadenopathy or  thryomegaly appreciated. Cor: PMI nondisplaced. Regular rate & rhythm. No rubs, gallops or murmurs. Lungs: Clear Abdomen: Soft, nontender, nondistended. No hepatosplenomegaly. No bruits or masses. Good bowel sounds. Extremities: No cyanosis, clubbing, rash, edema Neuro: Alert & oriented x 3, cranial nerves grossly intact. Moves all 4 extremities w/o difficulty. Affect pleasant. Delayed responses  ECG (personally reviewed): NSR 90 bpm  ASSESSMENT & PLAN: 1. Chronic systolic HF  - New diagnosis. - Echo at [facility] (1/24):  EF 15%, LV mod-sev dilated, LA mildly dilated, RV function normal, mod pulmonary hypertension, mod MR - R/LHC (1/24): mild CAD, biventricular failure and cardiogenic shock - cMRI (1/24): LVEF 17% RV EF 23%. Extracellular volume 36%. + myocardial fibrosis but no other findings of infiltrative disease  - Etiology of CM unclear, worry may be end-stage. - NYHA II-III, functional status confounded by deconditioning and fibromyalgia. Volume looks ok today.  - Increase losartan to 25 mg qhs. - Continue Farxiga 10 mg daily. - Continue digoxin 0.0625 mg daily. - Continue Lasix 40 mg daily. - Continue spiro 25 mg daily.   - Add beta blocker next, when she is further out from hospitalization - Not candidate for advanced therapies with age and comorbidities - If not improving, will need to think about Palliative involvement - BMET, dig level and BNP today, repeat BMET in 10 days. - Repeat echo when GDMT optimized. - Stay off Adderall for now. Will discuss further with Dr. Haroldine Laws    CAD - mild by cath (1/24) - No chest pain. - Continue ASA/statin   Bilateral pleural effusions - Seen on CT and CXR 1/18, suspect 2/2 volume overload - Continue Lasix - Oxygen sat 99% on  room air today.   DM II - Hgb A1c 7.7 - Continue SGLT2i. No GU symptoms   Microcytic anemia - T sat 12 and ferritin 12.  - Feraheme given 11/03/22. - No bleeding issues. - She has GI follow up  soon.   Follow up in 3-4 weeks with APP (add beta blocker) and 12 weeks with Dr. Haroldine Laws + echo.  Allena Katz, FNP-BC 11/10/22

## 2022-11-12 ENCOUNTER — Encounter (HOSPITAL_COMMUNITY): Payer: PPO

## 2022-11-12 DIAGNOSIS — Z7982 Long term (current) use of aspirin: Secondary | ICD-10-CM | POA: Diagnosis not present

## 2022-11-12 DIAGNOSIS — F32A Depression, unspecified: Secondary | ICD-10-CM | POA: Diagnosis not present

## 2022-11-12 DIAGNOSIS — Z96641 Presence of right artificial hip joint: Secondary | ICD-10-CM | POA: Diagnosis not present

## 2022-11-12 DIAGNOSIS — I272 Pulmonary hypertension, unspecified: Secondary | ICD-10-CM | POA: Diagnosis not present

## 2022-11-12 DIAGNOSIS — N179 Acute kidney failure, unspecified: Secondary | ICD-10-CM | POA: Diagnosis not present

## 2022-11-12 DIAGNOSIS — E785 Hyperlipidemia, unspecified: Secondary | ICD-10-CM | POA: Diagnosis not present

## 2022-11-12 DIAGNOSIS — K219 Gastro-esophageal reflux disease without esophagitis: Secondary | ICD-10-CM | POA: Diagnosis not present

## 2022-11-12 DIAGNOSIS — I429 Cardiomyopathy, unspecified: Secondary | ICD-10-CM | POA: Diagnosis not present

## 2022-11-12 DIAGNOSIS — I340 Nonrheumatic mitral (valve) insufficiency: Secondary | ICD-10-CM | POA: Diagnosis not present

## 2022-11-12 DIAGNOSIS — E1122 Type 2 diabetes mellitus with diabetic chronic kidney disease: Secondary | ICD-10-CM | POA: Diagnosis not present

## 2022-11-12 DIAGNOSIS — D631 Anemia in chronic kidney disease: Secondary | ICD-10-CM | POA: Diagnosis not present

## 2022-11-12 DIAGNOSIS — N184 Chronic kidney disease, stage 4 (severe): Secondary | ICD-10-CM | POA: Diagnosis not present

## 2022-11-12 DIAGNOSIS — E876 Hypokalemia: Secondary | ICD-10-CM | POA: Diagnosis not present

## 2022-11-12 DIAGNOSIS — I130 Hypertensive heart and chronic kidney disease with heart failure and stage 1 through stage 4 chronic kidney disease, or unspecified chronic kidney disease: Secondary | ICD-10-CM | POA: Diagnosis not present

## 2022-11-12 DIAGNOSIS — I2510 Atherosclerotic heart disease of native coronary artery without angina pectoris: Secondary | ICD-10-CM | POA: Diagnosis not present

## 2022-11-12 DIAGNOSIS — I5023 Acute on chronic systolic (congestive) heart failure: Secondary | ICD-10-CM | POA: Diagnosis not present

## 2022-11-12 DIAGNOSIS — Z7984 Long term (current) use of oral hypoglycemic drugs: Secondary | ICD-10-CM | POA: Diagnosis not present

## 2022-11-12 DIAGNOSIS — I5082 Biventricular heart failure: Secondary | ICD-10-CM | POA: Diagnosis not present

## 2022-11-12 DIAGNOSIS — E1142 Type 2 diabetes mellitus with diabetic polyneuropathy: Secondary | ICD-10-CM | POA: Diagnosis not present

## 2022-11-12 DIAGNOSIS — I700 Atherosclerosis of aorta: Secondary | ICD-10-CM | POA: Diagnosis not present

## 2022-11-12 DIAGNOSIS — D509 Iron deficiency anemia, unspecified: Secondary | ICD-10-CM | POA: Diagnosis not present

## 2022-11-12 DIAGNOSIS — F0393 Unspecified dementia, unspecified severity, with mood disturbance: Secondary | ICD-10-CM | POA: Diagnosis not present

## 2022-11-12 DIAGNOSIS — I2781 Cor pulmonale (chronic): Secondary | ICD-10-CM | POA: Diagnosis not present

## 2022-11-16 ENCOUNTER — Encounter (HOSPITAL_COMMUNITY): Payer: PPO

## 2022-11-16 DIAGNOSIS — I10 Essential (primary) hypertension: Secondary | ICD-10-CM | POA: Diagnosis not present

## 2022-11-16 DIAGNOSIS — I5020 Unspecified systolic (congestive) heart failure: Secondary | ICD-10-CM | POA: Diagnosis not present

## 2022-11-16 DIAGNOSIS — N1831 Chronic kidney disease, stage 3a: Secondary | ICD-10-CM | POA: Diagnosis not present

## 2022-11-16 DIAGNOSIS — D649 Anemia, unspecified: Secondary | ICD-10-CM | POA: Diagnosis not present

## 2022-11-18 ENCOUNTER — Telehealth (HOSPITAL_COMMUNITY): Payer: Self-pay

## 2022-11-18 NOTE — Telephone Encounter (Signed)
Erica Cervantes wanted to get her labs done at her PCP office in [place] instead of coming here. I faxed order to [facility] to have done and asked them to fax results to [place] when received.

## 2022-11-19 DIAGNOSIS — Z1329 Encounter for screening for other suspected endocrine disorder: Secondary | ICD-10-CM | POA: Diagnosis not present

## 2022-11-19 DIAGNOSIS — E1165 Type 2 diabetes mellitus with hyperglycemia: Secondary | ICD-10-CM | POA: Diagnosis not present

## 2022-11-19 DIAGNOSIS — D649 Anemia, unspecified: Secondary | ICD-10-CM | POA: Diagnosis not present

## 2022-11-19 DIAGNOSIS — I5020 Unspecified systolic (congestive) heart failure: Secondary | ICD-10-CM | POA: Diagnosis not present

## 2022-11-19 DIAGNOSIS — E782 Mixed hyperlipidemia: Secondary | ICD-10-CM | POA: Diagnosis not present

## 2022-11-19 DIAGNOSIS — D519 Vitamin B12 deficiency anemia, unspecified: Secondary | ICD-10-CM | POA: Diagnosis not present

## 2022-11-19 DIAGNOSIS — D529 Folate deficiency anemia, unspecified: Secondary | ICD-10-CM | POA: Diagnosis not present

## 2022-11-19 DIAGNOSIS — E7849 Other hyperlipidemia: Secondary | ICD-10-CM | POA: Diagnosis not present

## 2022-11-19 DIAGNOSIS — K2100 Gastro-esophageal reflux disease with esophagitis, without bleeding: Secondary | ICD-10-CM | POA: Diagnosis not present

## 2022-11-20 ENCOUNTER — Other Ambulatory Visit (HOSPITAL_COMMUNITY): Payer: Self-pay

## 2022-11-20 ENCOUNTER — Other Ambulatory Visit (HOSPITAL_COMMUNITY): Payer: PPO

## 2022-11-24 DIAGNOSIS — N184 Chronic kidney disease, stage 4 (severe): Secondary | ICD-10-CM | POA: Diagnosis not present

## 2022-11-24 DIAGNOSIS — I5023 Acute on chronic systolic (congestive) heart failure: Secondary | ICD-10-CM | POA: Diagnosis not present

## 2022-11-24 DIAGNOSIS — I2510 Atherosclerotic heart disease of native coronary artery without angina pectoris: Secondary | ICD-10-CM | POA: Diagnosis not present

## 2022-11-24 DIAGNOSIS — D631 Anemia in chronic kidney disease: Secondary | ICD-10-CM | POA: Diagnosis not present

## 2022-11-24 DIAGNOSIS — I5082 Biventricular heart failure: Secondary | ICD-10-CM | POA: Diagnosis not present

## 2022-11-24 DIAGNOSIS — I130 Hypertensive heart and chronic kidney disease with heart failure and stage 1 through stage 4 chronic kidney disease, or unspecified chronic kidney disease: Secondary | ICD-10-CM | POA: Diagnosis not present

## 2022-11-24 DIAGNOSIS — E1122 Type 2 diabetes mellitus with diabetic chronic kidney disease: Secondary | ICD-10-CM | POA: Diagnosis not present

## 2022-11-24 DIAGNOSIS — I429 Cardiomyopathy, unspecified: Secondary | ICD-10-CM | POA: Diagnosis not present

## 2022-11-27 ENCOUNTER — Telehealth: Payer: Self-pay

## 2022-11-27 NOTE — Telephone Encounter (Signed)
(  10:44 am) PC SW attempted to call patient and sister. This referral will be inactive due to no communication or follow-up by patient or family.

## 2022-11-30 DIAGNOSIS — F0393 Unspecified dementia, unspecified severity, with mood disturbance: Secondary | ICD-10-CM | POA: Diagnosis not present

## 2022-11-30 DIAGNOSIS — Z7982 Long term (current) use of aspirin: Secondary | ICD-10-CM | POA: Diagnosis not present

## 2022-11-30 DIAGNOSIS — I5023 Acute on chronic systolic (congestive) heart failure: Secondary | ICD-10-CM | POA: Diagnosis not present

## 2022-11-30 DIAGNOSIS — E1122 Type 2 diabetes mellitus with diabetic chronic kidney disease: Secondary | ICD-10-CM | POA: Diagnosis not present

## 2022-11-30 DIAGNOSIS — I5082 Biventricular heart failure: Secondary | ICD-10-CM | POA: Diagnosis not present

## 2022-11-30 DIAGNOSIS — D509 Iron deficiency anemia, unspecified: Secondary | ICD-10-CM | POA: Diagnosis not present

## 2022-11-30 DIAGNOSIS — I272 Pulmonary hypertension, unspecified: Secondary | ICD-10-CM | POA: Diagnosis not present

## 2022-11-30 DIAGNOSIS — D631 Anemia in chronic kidney disease: Secondary | ICD-10-CM | POA: Diagnosis not present

## 2022-11-30 DIAGNOSIS — N184 Chronic kidney disease, stage 4 (severe): Secondary | ICD-10-CM | POA: Diagnosis not present

## 2022-11-30 DIAGNOSIS — E785 Hyperlipidemia, unspecified: Secondary | ICD-10-CM | POA: Diagnosis not present

## 2022-11-30 DIAGNOSIS — Z96641 Presence of right artificial hip joint: Secondary | ICD-10-CM | POA: Diagnosis not present

## 2022-11-30 DIAGNOSIS — I429 Cardiomyopathy, unspecified: Secondary | ICD-10-CM | POA: Diagnosis not present

## 2022-11-30 DIAGNOSIS — I2781 Cor pulmonale (chronic): Secondary | ICD-10-CM | POA: Diagnosis not present

## 2022-11-30 DIAGNOSIS — K219 Gastro-esophageal reflux disease without esophagitis: Secondary | ICD-10-CM | POA: Diagnosis not present

## 2022-11-30 DIAGNOSIS — E1142 Type 2 diabetes mellitus with diabetic polyneuropathy: Secondary | ICD-10-CM | POA: Diagnosis not present

## 2022-11-30 DIAGNOSIS — F32A Depression, unspecified: Secondary | ICD-10-CM | POA: Diagnosis not present

## 2022-11-30 DIAGNOSIS — E876 Hypokalemia: Secondary | ICD-10-CM | POA: Diagnosis not present

## 2022-11-30 DIAGNOSIS — I130 Hypertensive heart and chronic kidney disease with heart failure and stage 1 through stage 4 chronic kidney disease, or unspecified chronic kidney disease: Secondary | ICD-10-CM | POA: Diagnosis not present

## 2022-11-30 DIAGNOSIS — I700 Atherosclerosis of aorta: Secondary | ICD-10-CM | POA: Diagnosis not present

## 2022-11-30 DIAGNOSIS — I340 Nonrheumatic mitral (valve) insufficiency: Secondary | ICD-10-CM | POA: Diagnosis not present

## 2022-11-30 DIAGNOSIS — N179 Acute kidney failure, unspecified: Secondary | ICD-10-CM | POA: Diagnosis not present

## 2022-11-30 DIAGNOSIS — I2510 Atherosclerotic heart disease of native coronary artery without angina pectoris: Secondary | ICD-10-CM | POA: Diagnosis not present

## 2022-11-30 DIAGNOSIS — Z7984 Long term (current) use of oral hypoglycemic drugs: Secondary | ICD-10-CM | POA: Diagnosis not present

## 2022-12-01 ENCOUNTER — Ambulatory Visit (INDEPENDENT_AMBULATORY_CARE_PROVIDER_SITE_OTHER): Payer: PPO | Admitting: Gastroenterology

## 2022-12-01 ENCOUNTER — Encounter (HOSPITAL_COMMUNITY): Payer: PPO

## 2022-12-01 ENCOUNTER — Encounter (INDEPENDENT_AMBULATORY_CARE_PROVIDER_SITE_OTHER): Payer: Self-pay | Admitting: Gastroenterology

## 2022-12-01 VITALS — BP 129/80 | HR 73 | Temp 97.5°F | Ht 63.5 in | Wt 139.0 lb

## 2022-12-01 DIAGNOSIS — D509 Iron deficiency anemia, unspecified: Secondary | ICD-10-CM | POA: Diagnosis not present

## 2022-12-01 NOTE — Progress Notes (Unsigned)
Referring Provider: Caryl Bis, MD Primary Care Physician:  Caryl Bis, MD Primary GI Physician: new   Chief Complaint  Patient presents with   Anemia    Referred for IDA.    HPI:   Erica Cervantes is a 77 y.o. female with past medical history of CKD stage III, depression, DM, GERD, HTN, peripheral, neuropathy, CHF  Patient presenting today as a new patient for IDA   Last labs hgb 10.1 on 11/06/22 TIBC 378, Iron 44, Iron sat 12%, ferritin 12, B12 297, folate >20 on 10/30/22 feraheme given 11/03/22  Notably had cardiac cath at the end of January, admitted to [facility] with SOB, fatigue and chest tightness. Echo showed EF 15%, transferred to [facility] for further workup.  diuresed with IV lasix and underwent R/LHC showing mild CAD, severe volume overload and hemodynamics consistent with CS. Started on IV lasix and milrinone. 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  Patient notes that her "left ventricle stopped" and she had recent hospitalization for heart issues. She reports she has been anemic and received iron infusion while she was admitted in the hospital. Notes anemia since November. Reports she did FOBT with PCP that was negative. Previously was not eating much at all prior to her cardiac event. She notes she has some constipation and sometimes has to manual disimpact herself. She started miralax over the past 3 days, 1 capful per day which seems to be helping. She denies any abdominal pain. Denies rectal bleeding or melena. No nausea or vomiting. No NSAIDs currently, previously taking meloxicam daily but PCP stopped this. Appetite is not great but denies overt early satiety. She has occasional SOB/dizziness, though worse prior to her cardiac episode as above.    NSAID use: none currently  Social hx:no etoh or tobacco  Fam hx: no CRC or liver disease  Last Colonoscopy: atleast 10 years ago, normal per patient. Last Endoscopy: never    Recommendations:    Past Medical History:  Diagnosis Date   Chronic kidney disease    stage 4   Depression    Diabetes mellitus without complication (HCC)    GERD (gastroesophageal reflux disease)    Hypertension    Neuromuscular disorder ([place])    peripheral neuropathy    Past Surgical History:  Procedure Laterality Date   ABDOMINAL HYSTERECTOMY  65   CHOLECYSTECTOMY     age 87   DILATION AND CURETTAGE OF UTERUS     x 4  1980   RIGHT/LEFT HEART CATH AND CORONARY ANGIOGRAPHY N/A 10/30/2022   Procedure: RIGHT/LEFT HEART CATH AND CORONARY ANGIOGRAPHY;  Surgeon: Jolaine Artist, MD;  Location: [facility];  Service: Cardiovascular;  Laterality: N/A;   TONSILLECTOMY     age 21   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   Procedure: TOTAL HIP ARTHROPLASTY ANTERIOR APPROACH;  Surgeon: Dorna Leitz, MD;  Location: [facility];  Service: Orthopedics;  Laterality: Right;    Current Outpatient Medications  Medication Sig Dispense Refill   amphetamine-dextroamphetamine (ADDERALL) 20 MG tablet Take 20-40 mg by mouth See admin instructions. Take 40 mg (2 tablets) by mouth in the morning and 20 mg (1 tablet) at 2 pm     aspirin EC 81 MG tablet Take 1 tablet (81 mg total) by mouth daily. Swallow whole. 30 tablet 12   atorvastatin (LIPITOR) 10 MG tablet Take 10 mg by mouth every Wednesday.     busPIRone (BUSPAR) 30 MG tablet Take 30  mg by mouth 3 (three) times daily.     Calcium Carb-Cholecalciferol (CALCIUM 500+D PO) Take 1 tablet by mouth daily.     cholecalciferol (VITAMIN D3) 25 MCG (1000 UNIT) tablet Take 2,000 Units by mouth daily.     cycloSPORINE (RESTASIS) 0.05 % ophthalmic emulsion Place 2 drops into both eyes 2 (two) times daily.     dapagliflozin propanediol (FARXIGA) 10 MG TABS tablet Take 1 tablet (10 mg total) by mouth daily. 30 tablet 0   digoxin (LANOXIN) 0.125 MG tablet Take 0.5 tablets (0.0625 mg total) by mouth daily. 15 tablet 0   donepezil (ARICEPT) 5 MG tablet  Take 5 mg by mouth at bedtime.     DULoxetine (CYMBALTA) 60 MG capsule Take 120 mg by mouth daily at 12 noon.     folic acid (FOLVITE) Q000111Q MCG tablet Take 800-2,400 mcg by mouth daily.     furosemide (LASIX) 40 MG tablet Take 1 tablet (40 mg total) by mouth daily. 30 tablet 0   Guaifenesin (MUCINEX MAXIMUM STRENGTH) 1200 MG TB12 Take 1,200 mg by mouth daily as needed (severe congestion).     losartan (COZAAR) 25 MG tablet Take 1 tablet (25 mg total) by mouth at bedtime. 30 tablet 8   metFORMIN (GLUCOPHAGE-XR) 500 MG 24 hr tablet Take 1,000 mg by mouth 2 (two) times daily.     Multiple Minerals-Vitamins (CAL MAG ZINC +D3) TABS Take 1 tablet by mouth daily.     omeprazole (PRILOSEC) 20 MG capsule Take 20 mg by mouth daily.     Probiotic Product (PROBIOTIC PO) Take 1 capsule by mouth daily.     spironolactone (ALDACTONE) 25 MG tablet Take 1 tablet (25 mg total) by mouth daily. 30 tablet 0   triamcinolone cream (KENALOG) 0.1 % Apply 1 application. topically 3 (three) times daily as needed for itching or rash.     No current facility-administered medications for this visit.    Allergies as of 12/01/2022 - Review Complete 12/01/2022  Allergen Reaction Noted   Sulfa antibiotics Shortness Of Breath 10/27/2017   Ace inhibitors Cough 01/29/2022   Codeine Itching and Swelling 10/27/2017   Penicillins  10/27/2017   Statins  10/27/2017   Tramadol  01/29/2022   Zetia [ezetimibe]  01/29/2022   Neosporin [neomycin-bacitracin zn-polymyx] Rash 10/27/2017    Family History  Problem Relation Age of Onset   Cancer Mother    Cancer Father    Heart attack Brother    Polycystic kidney disease Brother    Cancer Other     Social History   Socioeconomic History   Marital status: Single    Spouse name: Not on file   Number of children: Not on file   Years of education: Not on file   Highest education level: Not on file  Occupational History   Not on file  Tobacco Use   Smoking status: Never     Passive exposure: Never   Smokeless tobacco: Never  Vaping Use   Vaping Use: Never used  Substance and Sexual Activity   Alcohol use: No   Drug use: No   Sexual activity: Not on file  Other Topics Concern   Not on file  Social History Narrative   Not on file   Social Determinants of Health   Financial Resource Strain: Not on file  Food Insecurity: No Food Insecurity (10/30/2022)   Hunger Vital Sign    Worried About Running Out of Food in the Last Year: Never true  Ran Out of Food in the Last Year: Never true  Transportation Needs: No Transportation Needs (10/30/2022)   PRAPARE - Hydrologist (Medical): No    Lack of Transportation (Non-Medical): No  Physical Activity: Not on file  Stress: Not on file  Social Connections: Not on file    Review of systems General: negative for malaise, night sweats, fever, chills, weight loss Neck: Negative for lumps, goiter, pain and significant neck swelling Resp: Negative for cough, wheezing, dyspnea at rest CV: Negative for chest pain, leg swelling, palpitations, orthopnea GI: denies melena, hematochezia, nausea, vomiting, diarrhea, dysphagia, odyonophagia, early satiety or unintentional weight loss. +constipation  MSK: Negative for joint pain or swelling, back pain, and muscle pain. Derm: Negative for itching or rash Psych: Denies depression, anxiety, memory loss, confusion. No homicidal or suicidal ideation.  Heme: Negative for prolonged bleeding, bruising easily, and swollen nodes. Endocrine: Negative for cold or heat intolerance, polyuria, polydipsia and goiter. Neuro: negative for tremor, gait imbalance, syncope and seizures. The remainder of the review of systems is noncontributory.  Physical Exam: BP 129/80 (BP Location: Left Arm, Patient Position: Sitting, Cuff Size: Large)   Pulse 73   Temp (!) 97.5 F (36.4 C) (Oral)   Ht 5' 3.5" (1.613 m)   Wt 139 lb (63 kg)   BMI 24.24 kg/m  General:    Alert and oriented. No distress noted. Pleasant and cooperative.  Head:  Normocephalic and atraumatic. Eyes:  Conjuctiva clear without scleral icterus. Mouth:  Oral mucosa pink and moist. Good dentition. No lesions. Heart: Normal rate and rhythm, s1 and s2 heart sounds present.  Lungs: Clear lung sounds in all lobes. Respirations equal and unlabored. Abdomen:  +BS, soft, non-tender and non-distended. No rebound or guarding. No HSM or masses noted. Derm: No palmar erythema or jaundice Msk:  Symmetrical without gross deformities. Normal posture. Extremities:  Without edema. Neurologic:  Alert and  oriented x4 Psych:  Alert and cooperative. Normal mood and affect.  Invalid input(s): "6 MONTHS"   ASSESSMENT: Erica Cervantes is a 77 y.o. female presenting today for IDA.  Recent IDA with hgb 10.1, no rectal bleeding or melena. She has not required blood transfusion but received iron transfusion during recent admission for cardiogenic shock, Iron studies were WNL on 1/19. Would typically recommend EGD and colonoscopy for further evaluation of GI blood loss, Unfortunately patient has presented with multiple co-morbidities, EF estimated 15-17% and recent cardiogenic shock, she is not a candidate for advanced interventions cardiac wise. I advised the patient during her visit that I would discuss case with Dr. Jenetta Downer and possibly cardiology for definitive decision on pursuing endoscopic evaluations, however, I feel it will be unlikely she will be deemed fit for endoscopic procedures here at [facility] or even at a more advance endoscopic center, given the above listed factors. Will update patient after further discussion of her case.   PLAN:  Hematology referral  2. Pt to make me aware of rectal bleeding or melena  3. Continue PPI daily.  4. Avoid NSAIDs   All questions were answered, patient verbalized understanding and is in agreement with plan as outlined above.   Follow Up: 2  months  Tamika Nou L. Alver Sorrow, MSN, APRN, AGNP-C Adult-Gerontology Nurse Practitioner [facility]  ** Addendum:I did discuss the case with Dr. Jenetta Downer. Given patient's recent extensive cardiac issues, it is felt that undergoing endoscopic procedures/sedation would be contraindicated given patient's cardiac status. As she is  not having any overt bleeding, would recommend conservative management of her anemia at this time vs. Endoscopic evaluation. Will refer patient to hematology for further management of her anemia. I attempted to connect with the patient to discuss this further but was unable to reach her or leave a voice mail, I will reach out via mychart to ask he patient to call our office for discussion of this decision.   I have reviewed the note and agree with the APP's assessment as described in this progress note  Maylon Peppers, MD Gastroenterology and Hepatology [facility] Gastroenterology

## 2022-12-01 NOTE — Patient Instructions (Addendum)
We will discuss your case with your cardiologist to determine if they feel it is safe for you to undergo upper endoscopy and colonoscopy for your iron deficiency anemia   Please avoid NSAIDs (advil, aleve, naproxen, goody powder, ibuprofen) as these can be very hard on your GI tract, causing inflammation, ulcers and damage to the lining of your GI tract.   Please make me aware if you develop any rectal bleeding or black stools.   Follow up 3 months

## 2022-12-02 ENCOUNTER — Telehealth (INDEPENDENT_AMBULATORY_CARE_PROVIDER_SITE_OTHER): Payer: Self-pay | Admitting: Gastroenterology

## 2022-12-02 ENCOUNTER — Other Ambulatory Visit (HOSPITAL_COMMUNITY): Payer: Self-pay | Admitting: Internal Medicine

## 2022-12-02 NOTE — Progress Notes (Signed)
ADVANCED HF CLINIC CONSULT NOTE  Primary Care: Caryl Bis, MD HF Cardiologist: Dr. Haroldine Laws  HPI: Ms. Elwell is a 77 y.o. female with DMII, CKD, HTN, cognitive deficits, GERD, chronic pain, and fibromyalgia.   She was admitted to [facility] with SOB, fatigue and chest tightness. Echo showed EF 15% and she was transferred to [facility] for further workup. She was diuresed with IV lasix and underwent R/LHC showing mild CAD, severe volume overload and hemodynamics consistent with CS. Started on IV lasix and milrinone. cMRI showed 17%, RVEF 23%, moderate MR and otherwise myocardial fibrosis, but no other findings of infiltrative disease. Drips weaned and GDMT titrated, hospitalization complicated by anemia and bilateral pleural effusions. She was discharged home, weight 147 lbs.  Today she returns for HF follow up with her daughter. Overall feeling fine. She has SOB when she rakes leaves but does OK walking on flat ground or with ADLs. Denies palpitations, abnormal bleeding, CP, dizziness, edema, or PND/Orthopnea. She sleeps in adjustable bed reclined. Appetite ok. No fever or chills. Weight at home 138-140 pounds. Taking all medications. Planning on dental work soon, asking if she needs abx beforehand.   Some family cardiac history but she doesn't recall exactly what. Sister has a fib and her son has something cardiac related for which he's on meds.   Cardiac Studies  - cMRI (1/24): LVEF 17%, RVEF 23%, moderate MR (regurgitant fraction not accurate), no LGE, elevated EVC suggesting myocardial fibrosis but not suggestive of cardiac amyloidosis  - R/LHC (1/24):    Prox Cx to Mid Cx lesion is 30% stenosed.   Ramus lesion is 50% stenosed. RA =  15 PA = 46/28 (35) PCW = 23 CO/CI (Fick) = 2.9/1.7 CO/CI (thermo) = 2.0/1.1 SVR = 1572 (Fick)  2280 (Thermo) PVR = 4.2 (Fick) 6.1 (TD) PAPi = 1.2   - Echo (1/24) at [facility]: EF 15%, RV normal, mild to moderate central MR, moderate pulmonary HTN  Past  Medical History:  Diagnosis Date   Chronic kidney disease    stage 4   Depression    Diabetes mellitus without complication (HCC)    GERD (gastroesophageal reflux disease)    Hypertension    Neuromuscular disorder (HCC)    peripheral neuropathy   Current Outpatient Medications  Medication Sig Dispense Refill   amphetamine-dextroamphetamine (ADDERALL) 20 MG tablet Take 20-40 mg by mouth See admin instructions. Take 40 mg (2 tablets) by mouth in the morning and 20 mg (1 tablet) at 2 pm     aspirin EC 81 MG tablet Take 1 tablet (81 mg total) by mouth daily. Swallow whole. 30 tablet 12   atorvastatin (LIPITOR) 10 MG tablet Take 10 mg by mouth every Wednesday.     busPIRone (BUSPAR) 30 MG tablet Take 30 mg by mouth 3 (three) times daily.     Calcium Carb-Cholecalciferol (CALCIUM 500+D PO) Take 1 tablet by mouth daily.     cholecalciferol (VITAMIN D3) 25 MCG (1000 UNIT) tablet Take 2,000 Units by mouth daily.     cycloSPORINE (RESTASIS) 0.05 % ophthalmic emulsion Place 2 drops into both eyes 2 (two) times daily.     dapagliflozin propanediol (FARXIGA) 10 MG TABS tablet Take 1 tablet (10 mg total) by mouth daily. 30 tablet 0   digoxin (LANOXIN) 0.125 MG tablet Take 0.5 tablets (0.0625 mg total) by mouth daily. 15 tablet 0   donepezil (ARICEPT) 5 MG tablet Take 5 mg by mouth at bedtime.     DULoxetine (CYMBALTA) 60  MG capsule Take 120 mg by mouth daily at 12 noon.     folic acid (FOLVITE) Q000111Q MCG tablet Take 800-2,400 mcg by mouth daily.     furosemide (LASIX) 40 MG tablet Take 1 tablet (40 mg total) by mouth daily. 30 tablet 0   Guaifenesin (MUCINEX MAXIMUM STRENGTH) 1200 MG TB12 Take 1,200 mg by mouth daily as needed (severe congestion).     losartan (COZAAR) 25 MG tablet Take 1 tablet (25 mg total) by mouth at bedtime. 30 tablet 8   metFORMIN (GLUCOPHAGE-XR) 500 MG 24 hr tablet Take 1,000 mg by mouth 2 (two) times daily.     Multiple Minerals-Vitamins (CAL MAG ZINC +D3) TABS Take 1 tablet by  mouth daily.     omeprazole (PRILOSEC) 20 MG capsule Take 20 mg by mouth daily.     Probiotic Product (PROBIOTIC PO) Take 1 capsule by mouth daily.     spironolactone (ALDACTONE) 25 MG tablet Take 1 tablet (25 mg total) by mouth daily. 30 tablet 0   triamcinolone cream (KENALOG) 0.1 % Apply 1 application. topically 3 (three) times daily as needed for itching or rash.     No current facility-administered medications for this encounter.   Allergies  Allergen Reactions   Sulfa Antibiotics Shortness Of Breath   Ace Inhibitors Cough    Pt tolerates lisinopril    Codeine Itching and Swelling   Penicillins     Unknown reaction     Statins     Body aches    Tramadol     Numbness    Zetia [Ezetimibe]     Myalgia    Neosporin [Neomycin-Bacitracin Zn-Polymyx] Rash      Social History   Socioeconomic History   Marital status: Single    Spouse name: Not on file   Number of children: Not on file   Years of education: Not on file   Highest education level: Not on file  Occupational History   Not on file  Tobacco Use   Smoking status: Never    Passive exposure: Never   Smokeless tobacco: Never  Vaping Use   Vaping Use: Never used  Substance and Sexual Activity   Alcohol use: No   Drug use: No   Sexual activity: Not on file  Other Topics Concern   Not on file  Social History Narrative   Not on file   Social Determinants of Health   Financial Resource Strain: Not on file  Food Insecurity: No Food Insecurity (10/30/2022)   Hunger Vital Sign    Worried About Running Out of Food in the Last Year: Never true    Ran Out of Food in the Last Year: Never true  Transportation Needs: No Transportation Needs (10/30/2022)   PRAPARE - Hydrologist (Medical): No    Lack of Transportation (Non-Medical): No  Physical Activity: Not on file  Stress: Not on file  Social Connections: Not on file  Intimate Partner Violence: Not At Risk (10/30/2022)   Humiliation,  Afraid, Rape, and Kick questionnaire    Fear of Current or Ex-Partner: No    Emotionally Abused: No    Physically Abused: No    Sexually Abused: No   Family History  Problem Relation Age of Onset   Cancer Mother    Cancer Father    Heart attack Brother    Polycystic kidney disease Brother    Cancer Other    BP 122/78   Pulse 93   Wt  63.7 kg (140 lb 6.4 oz)   SpO2 98%   BMI 24.48 kg/m   Wt Readings from Last 3 Encounters:  12/04/22 63.7 kg (140 lb 6.4 oz)  12/01/22 63 kg (139 lb)  11/10/22 63.5 kg (140 lb)   PHYSICAL EXAM: General:  NAD. No resp difficulty, walked into clinic with cane HEENT: Normal Neck: Supple. No JVD. Carotids 2+ bilat; no bruits. No lymphadenopathy or thryomegaly appreciated. Cor: PMI nondisplaced. Regular rate & rhythm. No rubs, gallops or murmurs. Lungs: Clear Abdomen: Soft, nontender, nondistended. No hepatosplenomegaly. No bruits or masses. Good bowel sounds. Extremities: No cyanosis, clubbing, rash, edema Neuro: Alert & oriented x 3, cranial nerves grossly intact. Moves all 4 extremities w/o difficulty. Affect pleasant.  ASSESSMENT & PLAN: 1. Chronic systolic HF  - New diagnosis. - Echo at [facility] (1/24):  EF 15%, LV mod-sev dilated, LA mildly dilated, RV function normal, mod pulmonary hypertension, mod MR - R/LHC (1/24): mild CAD, biventricular failure and cardiogenic shock - cMRI (1/24): LVEF 17% RV EF 23%. Extracellular volume 36%. + myocardial fibrosis but no other findings of infiltrative disease  - Etiology of CM unclear, worry may be end-stage. - NYHA II-early III, functional status confounded by deconditioning and fibromyalgia. Volume looks ok today.  - Start Toprol XL 25 mg daily. - Continue losartan 25 mg qhs. - Continue Farxiga 10 mg daily. No GU symptoms. - Continue digoxin 0.0625 mg daily. Dig level 0.7 11/10/22 - Continue Lasix 40 mg daily. - Continue spiro 25 mg daily.   - Not candidate for advanced therapies with age and  comorbidities - If not improving, will need to think about Palliative involvement - Repeat echo next visit. - Labs today.   CAD - mild by cath (1/24) - No chest pain. - Continue ASA/statin   Bilateral pleural effusions - Seen on CT and CXR 1/18, suspect 2/2 volume overload - Continue Lasix - Oxygen sat 99% on room air today.   DM II - Hgb A1c 7.7 - Continue SGLT2i. No GU symptoms   Microcytic anemia - T sat 12 and ferritin 12.  - Feraheme given 11/03/22. - No bleeding issues. - She is followed by GI and Heme follow up.   Follow up in 12 weeks with Dr. Haroldine Laws + echo.  Allena Katz, FNP-BC 12/04/22

## 2022-12-02 NOTE — Addendum Note (Signed)
Addended by: Harvel Quale on: 12/02/2022 03:37 PM   Modules accepted: Level of Service

## 2022-12-03 NOTE — Telephone Encounter (Signed)
Referral placed in epic, they will contact patient with apt

## 2022-12-04 ENCOUNTER — Encounter (HOSPITAL_COMMUNITY): Payer: Self-pay

## 2022-12-04 ENCOUNTER — Ambulatory Visit (HOSPITAL_COMMUNITY)
Admission: RE | Admit: 2022-12-04 | Discharge: 2022-12-04 | Disposition: A | Discharge status: Home / Self Care | Payer: PPO | Source: Ambulatory Visit | Attending: Family Medicine | Admitting: Family Medicine

## 2022-12-04 VITALS — BP 122/78 | HR 93 | Wt 140.4 lb

## 2022-12-04 DIAGNOSIS — R4189 Other symptoms and signs involving cognitive functions and awareness: Secondary | ICD-10-CM | POA: Insufficient documentation

## 2022-12-04 DIAGNOSIS — Z7984 Long term (current) use of oral hypoglycemic drugs: Secondary | ICD-10-CM | POA: Diagnosis not present

## 2022-12-04 DIAGNOSIS — I5022 Chronic systolic (congestive) heart failure: Secondary | ICD-10-CM | POA: Diagnosis not present

## 2022-12-04 DIAGNOSIS — N184 Chronic kidney disease, stage 4 (severe): Secondary | ICD-10-CM | POA: Diagnosis not present

## 2022-12-04 DIAGNOSIS — Z79899 Other long term (current) drug therapy: Secondary | ICD-10-CM | POA: Diagnosis not present

## 2022-12-04 DIAGNOSIS — J90 Pleural effusion, not elsewhere classified: Secondary | ICD-10-CM | POA: Diagnosis not present

## 2022-12-04 DIAGNOSIS — G8929 Other chronic pain: Secondary | ICD-10-CM | POA: Insufficient documentation

## 2022-12-04 DIAGNOSIS — D509 Iron deficiency anemia, unspecified: Secondary | ICD-10-CM | POA: Insufficient documentation

## 2022-12-04 DIAGNOSIS — E1122 Type 2 diabetes mellitus with diabetic chronic kidney disease: Secondary | ICD-10-CM | POA: Diagnosis not present

## 2022-12-04 DIAGNOSIS — I2510 Atherosclerotic heart disease of native coronary artery without angina pectoris: Secondary | ICD-10-CM | POA: Diagnosis not present

## 2022-12-04 DIAGNOSIS — E1169 Type 2 diabetes mellitus with other specified complication: Secondary | ICD-10-CM

## 2022-12-04 DIAGNOSIS — I130 Hypertensive heart and chronic kidney disease with heart failure and stage 1 through stage 4 chronic kidney disease, or unspecified chronic kidney disease: Secondary | ICD-10-CM | POA: Diagnosis not present

## 2022-12-04 DIAGNOSIS — M797 Fibromyalgia: Secondary | ICD-10-CM | POA: Insufficient documentation

## 2022-12-04 DIAGNOSIS — Z7982 Long term (current) use of aspirin: Secondary | ICD-10-CM | POA: Diagnosis not present

## 2022-12-04 DIAGNOSIS — K219 Gastro-esophageal reflux disease without esophagitis: Secondary | ICD-10-CM | POA: Diagnosis not present

## 2022-12-04 LAB — BASIC METABOLIC PANEL
Anion gap: 8 (ref 5–15)
BUN: 34 mg/dL — ABNORMAL HIGH (ref 8–23)
CO2: 27 mmol/L (ref 22–32)
Calcium: 9.5 mg/dL (ref 8.9–10.3)
Chloride: 102 mmol/L (ref 98–111)
Creatinine, Ser: 1.1 mg/dL — ABNORMAL HIGH (ref 0.44–1.00)
GFR, Estimated: 51 mL/min — ABNORMAL LOW (ref >60)
Glucose, Bld: 106 mg/dL — ABNORMAL HIGH (ref 70–99)
Potassium: 4.1 mmol/L (ref 3.5–5.1)
Sodium: 137 mmol/L (ref 135–145)

## 2022-12-04 MED ORDER — METOPROLOL SUCCINATE ER 25 MG PO TB24: 25.0000 mg | ORAL_TABLET | Freq: Every day | ORAL | 5 refills | Status: AC

## 2022-12-04 NOTE — Patient Instructions (Addendum)
Good to see you today!  START Toprol XL 25 mg daily   Your physician has requested that you have an echocardiogram. Echocardiography is a painless test that uses sound waves to create images of your heart. It provides your doctor with information about the size and shape of your heart and how well your heart's chambers and valves are working. This procedure takes approximately one hour. There are no restrictions for this procedure. Please do NOT wear cologne, perfume, aftershave, or lotions (deodorant is allowed). Please arrive 15 minutes prior to your appointment time.  Your physician recommends that you schedule a follow-up appointment in: 3 months with echocardiogram(May) Call office in March to schedule an appointment   Labs done today, your results will be available in MyChart, we will contact you for abnormal readings.    If you have any questions or concerns before your next appointment please send [place] a message through [place] or call our office at [phone].    TO LEAVE A MESSAGE FOR THE NURSE SELECT OPTION 2, PLEASE LEAVE A MESSAGE INCLUDING: YOUR NAME DATE OF BIRTH CALL BACK NUMBER REASON FOR CALL**this is important as we prioritize the call backs  YOU WILL RECEIVE A CALL BACK THE SAME DAY AS LONG AS YOU CALL BEFORE 4:00 PM  At the [facility], you and your health needs are our priority. As part of our continuing mission to provide you with exceptional heart care, we have created designated Provider Care Teams. These Care Teams include your primary Cardiologist (physician) and Advanced Practice Providers (APPs- Physician Assistants and Nurse Practitioners) who all work together to provide you with the care you need, when you need it.   You may see any of the following providers on your designated Care Team at your next follow up: Dr Glori Bickers Dr Loralie Champagne Dr. Roxana Hires, NP Lyda Jester, [facility] [place], [place] Forestine Na, NP Audry Riles, PharmD   Please be sure to bring in all your medications bottles to every appointment.    Thank you for choosing [facility]

## 2022-12-06 DIAGNOSIS — I5020 Unspecified systolic (congestive) heart failure: Secondary | ICD-10-CM | POA: Diagnosis not present

## 2022-12-06 DIAGNOSIS — N1831 Chronic kidney disease, stage 3a: Secondary | ICD-10-CM | POA: Diagnosis not present

## 2022-12-06 DIAGNOSIS — D649 Anemia, unspecified: Secondary | ICD-10-CM | POA: Diagnosis not present

## 2022-12-07 DIAGNOSIS — D649 Anemia, unspecified: Secondary | ICD-10-CM | POA: Diagnosis not present

## 2022-12-07 DIAGNOSIS — E7849 Other hyperlipidemia: Secondary | ICD-10-CM | POA: Diagnosis not present

## 2022-12-07 DIAGNOSIS — Z1329 Encounter for screening for other suspected endocrine disorder: Secondary | ICD-10-CM | POA: Diagnosis not present

## 2022-12-07 DIAGNOSIS — N183 Chronic kidney disease, stage 3 unspecified: Secondary | ICD-10-CM | POA: Diagnosis not present

## 2022-12-07 DIAGNOSIS — F900 Attention-deficit hyperactivity disorder, predominantly inattentive type: Secondary | ICD-10-CM | POA: Diagnosis not present

## 2022-12-07 DIAGNOSIS — E1122 Type 2 diabetes mellitus with diabetic chronic kidney disease: Secondary | ICD-10-CM | POA: Diagnosis not present

## 2022-12-07 DIAGNOSIS — E1165 Type 2 diabetes mellitus with hyperglycemia: Secondary | ICD-10-CM | POA: Diagnosis not present

## 2022-12-07 DIAGNOSIS — D519 Vitamin B12 deficiency anemia, unspecified: Secondary | ICD-10-CM | POA: Diagnosis not present

## 2022-12-07 DIAGNOSIS — D529 Folate deficiency anemia, unspecified: Secondary | ICD-10-CM | POA: Diagnosis not present

## 2022-12-07 DIAGNOSIS — K2100 Gastro-esophageal reflux disease with esophagitis, without bleeding: Secondary | ICD-10-CM | POA: Diagnosis not present

## 2022-12-14 DIAGNOSIS — I10 Essential (primary) hypertension: Secondary | ICD-10-CM | POA: Diagnosis not present

## 2022-12-14 DIAGNOSIS — R413 Other amnesia: Secondary | ICD-10-CM | POA: Diagnosis not present

## 2022-12-14 DIAGNOSIS — K2100 Gastro-esophageal reflux disease with esophagitis, without bleeding: Secondary | ICD-10-CM | POA: Diagnosis not present

## 2022-12-14 DIAGNOSIS — L03811 Cellulitis of head [any part, except face]: Secondary | ICD-10-CM | POA: Diagnosis not present

## 2022-12-14 DIAGNOSIS — M1611 Unilateral primary osteoarthritis, right hip: Secondary | ICD-10-CM | POA: Diagnosis not present

## 2022-12-14 DIAGNOSIS — E1122 Type 2 diabetes mellitus with diabetic chronic kidney disease: Secondary | ICD-10-CM | POA: Diagnosis not present

## 2022-12-14 DIAGNOSIS — M797 Fibromyalgia: Secondary | ICD-10-CM | POA: Diagnosis not present

## 2022-12-14 DIAGNOSIS — F900 Attention-deficit hyperactivity disorder, predominantly inattentive type: Secondary | ICD-10-CM | POA: Diagnosis not present

## 2022-12-14 DIAGNOSIS — R4582 Worries: Secondary | ICD-10-CM | POA: Diagnosis not present

## 2022-12-14 DIAGNOSIS — E7849 Other hyperlipidemia: Secondary | ICD-10-CM | POA: Diagnosis not present

## 2022-12-14 DIAGNOSIS — E6609 Other obesity due to excess calories: Secondary | ICD-10-CM | POA: Diagnosis not present

## 2022-12-14 DIAGNOSIS — L280 Lichen simplex chronicus: Secondary | ICD-10-CM | POA: Diagnosis not present

## 2022-12-15 DIAGNOSIS — I130 Hypertensive heart and chronic kidney disease with heart failure and stage 1 through stage 4 chronic kidney disease, or unspecified chronic kidney disease: Secondary | ICD-10-CM | POA: Diagnosis not present

## 2022-12-15 DIAGNOSIS — I429 Cardiomyopathy, unspecified: Secondary | ICD-10-CM | POA: Diagnosis not present

## 2022-12-15 DIAGNOSIS — I5082 Biventricular heart failure: Secondary | ICD-10-CM | POA: Diagnosis not present

## 2022-12-15 DIAGNOSIS — N184 Chronic kidney disease, stage 4 (severe): Secondary | ICD-10-CM | POA: Diagnosis not present

## 2022-12-15 DIAGNOSIS — E1122 Type 2 diabetes mellitus with diabetic chronic kidney disease: Secondary | ICD-10-CM | POA: Diagnosis not present

## 2022-12-15 DIAGNOSIS — I2510 Atherosclerotic heart disease of native coronary artery without angina pectoris: Secondary | ICD-10-CM | POA: Diagnosis not present

## 2022-12-15 DIAGNOSIS — I5023 Acute on chronic systolic (congestive) heart failure: Secondary | ICD-10-CM | POA: Diagnosis not present

## 2022-12-15 DIAGNOSIS — D631 Anemia in chronic kidney disease: Secondary | ICD-10-CM | POA: Diagnosis not present

## 2022-12-25 ENCOUNTER — Inpatient Hospital Stay: Payer: PPO | Attending: Hematology | Admitting: Hematology

## 2022-12-25 ENCOUNTER — Inpatient Hospital Stay: Payer: PPO

## 2022-12-25 VITALS — BP 126/69 | HR 69 | Temp 98.4°F | Resp 17 | Ht 63.5 in | Wt 139.2 lb

## 2022-12-25 DIAGNOSIS — Z809 Family history of malignant neoplasm, unspecified: Secondary | ICD-10-CM | POA: Diagnosis not present

## 2022-12-25 DIAGNOSIS — D509 Iron deficiency anemia, unspecified: Secondary | ICD-10-CM | POA: Diagnosis not present

## 2022-12-25 DIAGNOSIS — Z808 Family history of malignant neoplasm of other organs or systems: Secondary | ICD-10-CM | POA: Diagnosis not present

## 2022-12-25 LAB — CBC WITH DIFFERENTIAL/PLATELET
Abs Immature Granulocytes: 0.03 10*3/uL (ref 0.00–0.07)
Basophils Absolute: 0.1 10*3/uL (ref 0.0–0.1)
Basophils Relative: 1 %
Eosinophils Absolute: 0.2 10*3/uL (ref 0.0–0.5)
Eosinophils Relative: 2 %
HCT: 44.6 % (ref 36.0–46.0)
Hemoglobin: 13.5 g/dL (ref 12.0–15.0)
Immature Granulocytes: 0 %
Lymphocytes Relative: 25 %
Lymphs Abs: 2 10*3/uL (ref 0.7–4.0)
MCH: 25.2 pg — ABNORMAL LOW (ref 26.0–34.0)
MCHC: 30.3 g/dL (ref 30.0–36.0)
MCV: 83.4 fL (ref 80.0–100.0)
Monocytes Absolute: 0.7 10*3/uL (ref 0.1–1.0)
Monocytes Relative: 9 %
Neutro Abs: 5 10*3/uL (ref 1.7–7.7)
Neutrophils Relative %: 63 %
Platelets: 368 10*3/uL (ref 150–400)
RBC: 5.35 MIL/uL — ABNORMAL HIGH (ref 3.87–5.11)
RDW: 24.4 % — ABNORMAL HIGH (ref 11.5–15.5)
WBC Morphology: REACTIVE
WBC: 8 10*3/uL (ref 4.0–10.5)
nRBC: 0 % (ref 0.0–0.2)

## 2022-12-25 LAB — IRON AND TIBC
Iron: 63 ug/dL (ref 28–170)
Saturation Ratios: 15 % (ref 10.4–31.8)
TIBC: 417 ug/dL (ref 250–450)
UIBC: 354 ug/dL

## 2022-12-25 LAB — VITAMIN B12: Vitamin B-12: 118 pg/mL — ABNORMAL LOW (ref 180–914)

## 2022-12-25 LAB — RETICULOCYTES
Immature Retic Fract: 8.3 % (ref 2.3–15.9)
RBC.: 5.4 MIL/uL — ABNORMAL HIGH (ref 3.87–5.11)
Retic Count, Absolute: 50.8 10*3/uL (ref 19.0–186.0)
Retic Ct Pct: 0.9 % (ref 0.4–3.1)

## 2022-12-25 LAB — FOLATE: Folate: >40 ng/mL (ref >5.9)

## 2022-12-25 LAB — DIRECT ANTIGLOBULIN TEST (NOT AT ARMC)
DAT, IgG: NEGATIVE
DAT, complement: NEGATIVE

## 2022-12-25 LAB — LACTATE DEHYDROGENASE: LDH: 134 U/L (ref 98–192)

## 2022-12-25 LAB — FERRITIN: Ferritin: 18 ng/mL (ref 11–307)

## 2022-12-25 NOTE — Progress Notes (Signed)
[name] [address], [place] [address]   Clinic Day:  12/25/2022  Referring physician: Gabriel Rung, NP  Patient Care Team: Derek Jack, MD as PCP - General (Hematology) Derek Jack, MD as Medical Oncologist (Hematology)   ASSESSMENT & PLAN:   Assessment:  1.  Severe microcytic anemia from iron deficiency: - CBC (11/06/2022): Hb-10.1, MCV-73.3 - 10/30/2022: Ferritin-12, percent saturation 12 - Feraheme x 1 on 11/03/2022. - She is not on oral iron therapy.  No prior history of blood transfusion.  Denies any bleeding per rectum or melena. - Last colonoscopy at [facility] long time ago.  She thinks she had at least 5 colonoscopies.  2.  Social/family history: - Lives with a friend at home.  She is independent of ADLs and IADLs.  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  Non-smoker. - Son has anemia.  Mother had ovarian cancer.  Father had prostate cancer.  Sister had melanoma.  Plan:  1.  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 - Will check her CBC, ferritin and iron panel as baseline today.  Will also check for other nutritional deficiencies, hemolysis and bone marrow infiltrative process.  Will also check stool for occult blood. - RTC 2 weeks for follow-up.   Orders Placed This Encounter  Procedures   CBC with Differential    Standing Status:   Future    Number of Occurrences:   1    Standing Expiration Date:   12/25/2023   Reticulocytes    Standing Status:   Future    Number of Occurrences:   1    Standing Expiration Date:   12/25/2023   Lactate dehydrogenase    Standing Status:   Future    Number of Occurrences:   1    Standing Expiration Date:   12/25/2023   Iron and TIBC ([facility] ONLY)    Standing Status:   Future    Number of Occurrences:    1    Standing Expiration Date:   12/25/2023   Ferritin    Standing Status:   Future    Number of Occurrences:   1    Standing Expiration Date:   12/25/2023   Vitamin B12    Standing Status:   Future    Number of Occurrences:   1    Standing Expiration Date:   12/25/2023   Folate    Standing Status:   Future    Number of Occurrences:   1    Standing Expiration Date:   12/25/2023   Methylmalonic acid, serum    Standing Status:   Future    Number of Occurrences:   1    Standing Expiration Date:   12/25/2023   Copper, serum    Standing Status:   Future    Number of Occurrences:   1    Standing Expiration Date:   12/25/2023   Protein electrophoresis, serum    Standing Status:   Future    Number of Occurrences:   1    Standing Expiration Date:   12/25/2023   Kappa/lambda light chains    Standing Status:   Future    Number of Occurrences:   1    Standing Expiration Date:   12/25/2023   Occult blood x 1 card to  lab, stool    Standing Status:   Future    Standing Expiration Date:   12/25/2023    Order Specific Question:   Release to patient    Answer:   Immediate   Occult blood x 1 card to lab, stool    Standing Status:   Future    Standing Expiration Date:   12/25/2023    Order Specific Question:   Release to patient    Answer:   Immediate   Occult blood x 1 card to lab, stool    Standing Status:   Future    Standing Expiration Date:   12/25/2023    Order Specific Question:   Release to patient    Answer:   Immediate   Direct antiglobulin test    Standing Status:   Future    Number of Occurrences:   1    Standing Expiration Date:   12/25/2023      I,Alexis Herring,acting as a scribe for Derek Jack, MD.,have documented all relevant documentation on the behalf of Derek Jack, MD,as directed by  Derek Jack, MD while in the presence of Derek Jack, MD.   I, Derek Jack MD, have reviewed the above documentation for accuracy and completeness,  and I agree with the above.    Derek Jack, MD   3/15/202412:26 PM  CHIEF COMPLAINT/PURPOSE OF CONSULT:   Diagnosis: Microcytic anemia from iron deficiency  Current Therapy: Feraheme x 2  HISTORY OF PRESENT ILLNESS:   Erica Cervantes is a 77 y.o. female presenting to clinic today for evaluation of anemia at the request of NP Scherrie Gerlach--.  Patient was seen in the ED in [place] on 10/20/22 for shortness of breath and generalized weakness. Her work-up revealed mild cardiac enlargement with mild pulmonary vascular congestion and stable vitals, particularly SPO2 of 98% on RA. Her CBC during her ED visit showed a HGB of 9.6, HCT of 32.5, MCV of 74.7, and platelets of 537. Her symptoms were attributed to her anemia as her other testing, including rapid flu, RSV, and COVID were negative. She was discharged home and recommended to follow up with her PCP for further work-up.  Patient has since been admitted for 8 days beginning on 10/29/22 for heart failure exacerbation. 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  Per review of Dr. Riccardo Dubin Arrien's discharge summary from 11/08/22, her work-up showed: Na 137, K 4.2 Cl 102, bicarbonate 23, glucose 197 bun 29 cr 1.0 BNP 1,377 High sensitive troponin 23 and 28  Wbc 11.1 hgb 9,4 plt 453  Urine analysis SG 1,012, 30 protein, small leukocytes.  Sars covid 19 and influenza negative.  Chest radiograph with cardiomegaly with bilateral hilar vascular congestion and bilateral pleural effusions. CT chest with bilateral ground glass opacities, with bilateral pleural effusions.  EKG 109 bpm, right axis deviation, normal intervals, sinus rhythm with no significant ST segment or T wave changes.  She was diuresed with improvement of her condition, and was able to be discharged home with outpatient CV follow up.  Today, she states that she is doing well overall. Her appetite  level is at 70%. Her energy level is at 80%. She denies any ice pica.  Denies any bleeding per rectum or melena.  No prior history of blood transfusion.  She has received Feraheme x 1 in the hospital on 11/03/2022.  PAST MEDICAL HISTORY:   Past Medical History: Past Medical History:  Diagnosis Date  Chronic kidney disease    stage 4   Depression    Diabetes mellitus without complication (HCC)    GERD (gastroesophageal reflux disease)    Hypertension    Neuromuscular disorder ([place])    peripheral neuropathy    Surgical History: Past Surgical History:  Procedure Laterality Date   ABDOMINAL HYSTERECTOMY  28   CHOLECYSTECTOMY     age 43   DILATION AND CURETTAGE OF UTERUS     x 4  1980   RIGHT/LEFT HEART CATH AND CORONARY ANGIOGRAPHY N/A 10/30/2022   Procedure: RIGHT/LEFT HEART CATH AND CORONARY ANGIOGRAPHY;  Surgeon: Jolaine Artist, MD;  Location: [facility];  Service: Cardiovascular;  Laterality: N/A;   TONSILLECTOMY     age 45   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   Procedure: TOTAL HIP ARTHROPLASTY ANTERIOR APPROACH;  Surgeon: Dorna Leitz, MD;  Location: [facility];  Service: Orthopedics;  Laterality: Right;    Social History: Social History   Socioeconomic History   Marital status: Single    Spouse name: Not on file   Number of children: Not on file   Years of education: Not on file   Highest education level: Not on file  Occupational History   Not on file  Tobacco Use   Smoking status: Never    Passive exposure: Never   Smokeless tobacco: Never  Vaping Use   Vaping Use: Never used  Substance and Sexual Activity   Alcohol use: No   Drug use: No   Sexual activity: Not on file  Other Topics Concern   Not on file  Social History Narrative   Not on file   Social Determinants of Health   Financial Resource Strain: Not on file  Food Insecurity: No Food Insecurity (12/25/2022)   Hunger Vital Sign    Worried About Running Out of Food in the Last  Year: Never true    Ran Out of Food in the Last Year: Never true  Transportation Needs: No Transportation Needs (12/25/2022)   PRAPARE - Hydrologist (Medical): No    Lack of Transportation (Non-Medical): No  Physical Activity: Not on file  Stress: Not on file  Social Connections: Not on file  Intimate Partner Violence: Not At Risk (12/25/2022)   Humiliation, Afraid, Rape, and Kick questionnaire    Fear of Current or Ex-Partner: No    Emotionally Abused: No    Physically Abused: No    Sexually Abused: No    Family History: Family History  Problem Relation Age of Onset   Cancer Mother    Cancer Father    Heart attack Brother    Polycystic kidney disease Brother    Cancer Other     Current Medications:  Current Outpatient Medications:    amphetamine-dextroamphetamine (ADDERALL) 20 MG tablet, Take 20-40 mg by mouth See admin instructions. Take 40 mg (2 tablets) by mouth in the morning and 20 mg (1 tablet) at 2 pm, Disp: , Rfl:    aspirin EC 81 MG tablet, Take 1 tablet (81 mg total) by mouth daily. Swallow whole., Disp: 30 tablet, Rfl: 12   atorvastatin (LIPITOR) 10 MG tablet, Take 10 mg by mouth every Wednesday., Disp: , Rfl:    busPIRone (BUSPAR) 30 MG tablet, Take 30 mg by mouth 3 (three) times daily., Disp: , Rfl:    Calcium Carb-Cholecalciferol (CALCIUM 500+D PO), Take 1 tablet by mouth daily., Disp: , Rfl:    cholecalciferol (VITAMIN D3) 25 MCG (1000  UNIT) tablet, Take 2,000 Units by mouth daily., Disp: , Rfl:    cycloSPORINE (RESTASIS) 0.05 % ophthalmic emulsion, Place 2 drops into both eyes 2 (two) times daily., Disp: , Rfl:    donepezil (ARICEPT) 5 MG tablet, Take 5 mg by mouth at bedtime., Disp: , Rfl:    DULoxetine (CYMBALTA) 60 MG capsule, Take 120 mg by mouth daily at 12 noon., Disp: , Rfl:    folic acid (FOLVITE) Q000111Q MCG tablet, Take 800-2,400 mcg by mouth daily., Disp: , Rfl:    Guaifenesin (MUCINEX MAXIMUM STRENGTH) 1200 MG TB12, Take  1,200 mg by mouth daily as needed (severe congestion)., Disp: , Rfl:    losartan (COZAAR) 25 MG tablet, Take 1 tablet (25 mg total) by mouth at bedtime., Disp: 30 tablet, Rfl: 8   metFORMIN (GLUCOPHAGE-XR) 500 MG 24 hr tablet, Take 1,000 mg by mouth 2 (two) times daily., Disp: , Rfl:    metoprolol succinate (TOPROL XL) 25 MG 24 hr tablet, Take 1 tablet (25 mg total) by mouth daily., Disp: 60 tablet, Rfl: 5   Multiple Minerals-Vitamins (CAL MAG ZINC +D3) TABS, Take 1 tablet by mouth daily., Disp: , Rfl:    omeprazole (PRILOSEC) 20 MG capsule, Take 20 mg by mouth daily., Disp: , Rfl:    Probiotic Product (PROBIOTIC PO), Take 1 capsule by mouth daily., Disp: , Rfl:    triamcinolone cream (KENALOG) 0.1 %, Apply 1 application. topically 3 (three) times daily as needed for itching or rash., Disp: , Rfl:    digoxin (LANOXIN) 0.125 MG tablet, Take 0.5 tablets (0.0625 mg total) by mouth daily., Disp: 15 tablet, Rfl: 0   furosemide (LASIX) 40 MG tablet, Take 1 tablet (40 mg total) by mouth daily., Disp: 30 tablet, Rfl: 0   spironolactone (ALDACTONE) 25 MG tablet, Take 1 tablet (25 mg total) by mouth daily., Disp: 30 tablet, Rfl: 0   Allergies: Allergies  Allergen Reactions   Sulfa Antibiotics Shortness Of Breath   Ace Inhibitors Cough    Pt tolerates lisinopril    Codeine Itching and Swelling   Penicillins     Unknown reaction     Statins     Body aches    Tramadol     Numbness    Zetia [Ezetimibe]     Myalgia    Neosporin [Neomycin-Bacitracin Zn-Polymyx] Rash    REVIEW OF SYSTEMS:   Review of Systems  Constitutional:  Negative for chills, fatigue and fever.  HENT:   Negative for lump/mass, mouth sores, nosebleeds, sore throat and trouble swallowing.   Eyes:  Negative for eye problems.  Respiratory:  Positive for cough. Negative for shortness of breath.   Cardiovascular:  Negative for chest pain, leg swelling and palpitations.  Gastrointestinal:  Positive for constipation. Negative for  abdominal pain, diarrhea, nausea and vomiting.  Genitourinary:  Negative for bladder incontinence, difficulty urinating, dysuria, frequency, hematuria and nocturia.   Musculoskeletal:  Negative for arthralgias, back pain, flank pain, myalgias and neck pain.  Skin:  Negative for itching and rash.  Neurological:  Positive for dizziness and headaches. Negative for numbness.  Hematological:  Does not bruise/bleed easily.  Psychiatric/Behavioral:  Negative for depression, sleep disturbance and suicidal ideas. The patient is not nervous/anxious.   All other systems reviewed and are negative.    VITALS:   Blood pressure 126/69, pulse 69, temperature 98.4 F (36.9 C), temperature source Oral, resp. rate 17, height 5' 3.5" (1.613 m), weight 139 lb 3.2 oz (63.1 kg), SpO2 100 %.  Wt Readings from Last 3 Encounters:  12/25/22 139 lb 3.2 oz (63.1 kg)  12/04/22 140 lb 6.4 oz (63.7 kg)  12/01/22 139 lb (63 kg)    Body mass index is 24.27 kg/m.   PHYSICAL EXAM:   Physical Exam Vitals and nursing note reviewed. Exam conducted with a chaperone present.  Constitutional:      Appearance: Normal appearance.  Cardiovascular:     Rate and Rhythm: Normal rate and regular rhythm.     Pulses: Normal pulses.     Heart sounds: Normal heart sounds.  Pulmonary:     Effort: Pulmonary effort is normal.     Breath sounds: Normal breath sounds.  Abdominal:     Palpations: Abdomen is soft. There is no hepatomegaly, splenomegaly or mass.     Tenderness: There is no abdominal tenderness.  Musculoskeletal:     Right lower leg: Edema present.     Left lower leg: Edema present.  Lymphadenopathy:     Cervical: No cervical adenopathy.     Right cervical: No superficial, deep or posterior cervical adenopathy.    Left cervical: No superficial, deep or posterior cervical adenopathy.     Upper Body:     Right upper body: No supraclavicular or axillary adenopathy.     Left upper body: No supraclavicular or axillary  adenopathy.  Neurological:     General: No focal deficit present.     Mental Status: She is alert and oriented to person, place, and time.  Psychiatric:        Mood and Affect: Mood normal.        Behavior: Behavior normal.     LABS:      Latest Ref Rng & Units 11/06/2022    2:31 AM 11/05/2022    1:56 AM 11/04/2022    4:20 AM  CBC  WBC 4.0 - 10.5 K/uL 11.6  9.6  8.2   Hemoglobin 12.0 - 15.0 g/dL 10.1  9.3  8.4   Hematocrit 36.0 - 46.0 % 33.8  31.1  29.3   Platelets 150 - 400 K/uL 395  349  299       Latest Ref Rng & Units 12/04/2022    3:58 PM 11/10/2022    4:11 PM 11/06/2022    2:31 AM  CMP  Glucose 70 - 99 mg/dL 106  125  165   BUN 8 - 23 mg/dL 34  31  31   Creatinine 0.44 - 1.00 mg/dL 1.10  1.20  1.09   Sodium 135 - 145 mmol/L 137  136  137   Potassium 3.5 - 5.1 mmol/L 4.1  4.1  3.9   Chloride 98 - 111 mmol/L 102  100  100   CO2 22 - 32 mmol/L 27  26  27    Calcium 8.9 - 10.3 mg/dL 9.5  10.1  9.5      No results found for: "CEA1", "CEA" / No results found for: "CEA1", "CEA" No results found for: "PSA1" No results found for: "[id number]" No results found for: "CAN125"  No results found for: "TOTALPROTELP", "ALBUMINELP", "A1GS", "A2GS", "BETS", "BETA2SER", "GAMS", "MSPIKE", "SPEI" Lab Results  Component Value Date   TIBC 378 10/30/2022   FERRITIN 12 10/30/2022   IRONPCTSAT 12 10/30/2022   No results found for: "LDH"   STUDIES:   No results found.

## 2022-12-25 NOTE — Patient Instructions (Addendum)
[facility]  Discharge Instructions  You were seen and examined today by Dr. Delton Coombes. Dr. Delton Coombes is a hematologist, meaning that he specializes in blood abnormalities. Dr. Delton Coombes discussed your past medical history, family history of cancers/blood conditions and the events that led to you being here today.  You were referred to Dr. Delton Coombes due to anemia.  Dr. Delton Coombes has recommended additional labs today for further evaluation.  Dr. Delton Coombes will arrange for iron infusions.  Follow-up as scheduled.  Thank you for choosing [facility] to provide your oncology and hematology care.   To afford each patient quality time with our provider, please arrive at least 15 minutes before your scheduled appointment time. You may need to reschedule your appointment if you arrive late (10 or more minutes). Arriving late affects you and other patients whose appointments are after yours.  Also, if you miss three or more appointments without notifying the office, you may be dismissed from the clinic at the provider's discretion.    Again, thank you for choosing [facility].  Our hope is that these requests will decrease the amount of time that you wait before being seen by our physicians.   If you have a lab appointment with the [facility] please come in thru the Main Entrance and check in at the main information desk.           _____________________________________________________________  Should you have questions after your visit to [facility], please contact our office at [phone] and follow the prompts.  Our office hours are 8:00 a.m. to 4:30 p.m. Monday - Thursday and 8:00 a.m. to 2:30 p.m. Friday.  Please note that voicemails left after 4:00 p.m. may not be returned until the following business day.  We are closed weekends and all major holidays.  You do have access to a nurse 24-7, just call the  main number to the clinic [phone] and do not press any options, hold on the line and a nurse will answer the phone.    For prescription refill requests, have your pharmacy contact our office and allow 72 hours.    Masks are optional in the cancer centers. If you would like for your care team to wear a mask while they are taking care of you, please let them know. You may have one support person who is at least 79 years old accompany you for your appointments.

## 2022-12-28 LAB — KAPPA/LAMBDA LIGHT CHAINS
Kappa free light chain: 53.9 mg/L — ABNORMAL HIGH (ref 3.3–19.4)
Kappa, lambda light chain ratio: 1.83 — ABNORMAL HIGH (ref 0.26–1.65)
Lambda free light chains: 29.4 mg/L — ABNORMAL HIGH (ref 5.7–26.3)

## 2022-12-29 ENCOUNTER — Inpatient Hospital Stay: Payer: PPO

## 2022-12-29 ENCOUNTER — Other Ambulatory Visit (HOSPITAL_COMMUNITY)
Admission: RE | Admit: 2022-12-29 | Discharge: 2022-12-29 | Disposition: A | Discharge status: Home / Self Care | Payer: PPO | Source: Ambulatory Visit | Attending: Hematology | Admitting: Hematology

## 2022-12-29 ENCOUNTER — Other Ambulatory Visit: Payer: Self-pay

## 2022-12-29 VITALS — BP 115/60 | HR 59 | Temp 97.4°F | Resp 18 | Wt 138.6 lb

## 2022-12-29 DIAGNOSIS — D509 Iron deficiency anemia, unspecified: Secondary | ICD-10-CM | POA: Diagnosis not present

## 2022-12-29 MED ORDER — ACETAMINOPHEN 325 MG PO TABS
650.0000 mg | ORAL_TABLET | Freq: Once | ORAL | Status: AC
  Administered 2022-12-29: 650 mg via ORAL
  Filled 2022-12-29: qty 2

## 2022-12-29 MED ORDER — SODIUM CHLORIDE 0.9 % IV SOLN
510.0000 mg | Freq: Once | INTRAVENOUS | Status: AC
  Administered 2022-12-29: 510 mg via INTRAVENOUS
  Filled 2022-12-29: qty 510

## 2022-12-29 MED ORDER — CETIRIZINE HCL 10 MG PO TABS
10.0000 mg | ORAL_TABLET | Freq: Once | ORAL | Status: AC
  Administered 2022-12-29: 10 mg via ORAL
  Filled 2022-12-29: qty 1

## 2022-12-29 MED ORDER — SODIUM CHLORIDE 0.9 % IV SOLN: Freq: Once | INTRAVENOUS | Status: AC

## 2022-12-29 NOTE — Patient Instructions (Signed)
[facility]  Discharge Instructions: Thank you for choosing [facility] to provide your oncology and hematology care.  If you have a lab appointment with the Cancer Center, please come in thru the Main Entrance and check in at the main information desk.  Wear comfortable clothing and clothing appropriate for easy access to any Portacath or PICC line.   We strive to give you quality time with your provider. You may need to reschedule your appointment if you arrive late (15 or more minutes).  Arriving late affects you and other patients whose appointments are after yours.  Also, if you miss three or more appointments without notifying the office, you may be dismissed from the clinic at the provider's discretion.      For prescription refill requests, have your pharmacy contact our office and allow 72 hours for refills to be completed.    Today you received the following chemotherapy and/or immunotherapy agents Feraheme. Ferumoxytol Injection What is this medication? FERUMOXYTOL (FER ue MOX i tol) treats low levels of iron in your body (iron deficiency anemia). Iron is a mineral that plays an important role in making red blood cells, which carry oxygen from your lungs to the rest of your body. This medicine may be used for other purposes; ask your health care provider or pharmacist if you have questions. COMMON BRAND NAME(S): Feraheme What should I tell my care team before I take this medication? They need to know if you have any of these conditions: Anemia not caused by low iron levels High levels of iron in the blood Magnetic resonance imaging (MRI) test scheduled An unusual or allergic reaction to iron, other medications, foods, dyes, or preservatives Pregnant or trying to get pregnant Breastfeeding How should I use this medication? This medication is injected into a vein. It is given by your care team in a hospital or clinic setting. Talk to your care  team the use of this medication in children. Special care may be needed. Overdosage: If you think you have taken too much of this medicine contact a poison control center or emergency room at once. NOTE: This medicine is only for you. Do not share this medicine with others. What if I miss a dose? It is important not to miss your dose. Call your care team if you are unable to keep an appointment. What may interact with this medication? Other iron products This list may not describe all possible interactions. Give your health care provider a list of all the medicines, herbs, non-prescription drugs, or dietary supplements you use. Also tell them if you smoke, drink alcohol, or use illegal drugs. Some items may interact with your medicine. What should I watch for while using this medication? Visit your care team regularly. Tell your care team if your symptoms do not start to get better or if they get worse. You may need blood work done while you are taking this medication. You may need to follow a special diet. Talk to your care team. Foods that contain iron include: whole grains/cereals, dried fruits, beans, or peas, leafy green vegetables, and organ meats (liver, kidney). What side effects may I notice from receiving this medication? Side effects that you should report to your care team as soon as possible: Allergic reactions--skin rash, itching, hives, swelling of the face, lips, tongue, or throat Low blood pressure--dizziness, feeling faint or lightheaded, blurry vision Shortness of breath Side effects that usually do not require medical attention (report to your   care team if they continue or are bothersome): Flushing Headache Joint pain Muscle pain Nausea Pain, redness, or irritation at injection site This list may not describe all possible side effects. Call your doctor for medical advice about side effects. You may report side effects to FDA at 1-800-FDA-1088. Where should I keep my  medication? This medication is given in a hospital or clinic and will not be stored at home. NOTE: This sheet is a summary. It may not cover all possible information. If you have questions about this medicine, talk to your doctor, pharmacist, or health care provider.  2023 Elsevier/Gold Standard (2021-02-19 00:00:00)       To help prevent nausea and vomiting after your treatment, we encourage you to take your nausea medication as directed.  BELOW ARE SYMPTOMS THAT SHOULD BE REPORTED IMMEDIATELY: *FEVER GREATER THAN 100.4 F (38 C) OR HIGHER *CHILLS OR SWEATING *NAUSEA AND VOMITING THAT IS NOT CONTROLLED WITH YOUR NAUSEA MEDICATION *UNUSUAL SHORTNESS OF BREATH *UNUSUAL BRUISING OR BLEEDING *URINARY PROBLEMS (pain or burning when urinating, or frequent urination) *BOWEL PROBLEMS (unusual diarrhea, constipation, pain near the anus) TENDERNESS IN MOUTH AND THROAT WITH OR WITHOUT PRESENCE OF ULCERS (sore throat, sores in mouth, or a toothache) UNUSUAL RASH, SWELLING OR PAIN  UNUSUAL VAGINAL DISCHARGE OR ITCHING   Items with * indicate a potential emergency and should be followed up as soon as possible or go to the Emergency Department if any problems should occur.  Please show the CHEMOTHERAPY ALERT CARD or IMMUNOTHERAPY ALERT CARD at check-in to the Emergency Department and triage nurse.  Should you have questions after your visit or need to cancel or reschedule your appointment, please contact [facility] [phone]  and follow the prompts.  Office hours are 8:00 a.m. to 4:30 p.m. Monday - Friday. Please note that voicemails left after 4:00 p.m. may not be returned until the following business day.  We are closed weekends and major holidays. You have access to a nurse at all times for urgent questions. Please call the main number to the clinic [phone] and follow the prompts.  For any non-urgent questions, you may also contact your provider using MyChart. We now  offer e-Visits for anyone 18 and older to request care online for non-urgent symptoms. For details visit [email or web address].   Also download the MyChart app! Go to the app store, search "MyChart", open the app, select [facility], and log in with your MyChart username and password.   

## 2022-12-29 NOTE — Progress Notes (Signed)
Patient presents today for iron infusion.  Patient is in satisfactory condition with no new complaints voiced.  Vital signs are stable.  We will proceed with infusion per provider orders.   Patient tolerated treatment well with no complaints voiced.  Patient left ambulatory in stable condition.  Vital signs stable at discharge.  Follow up as scheduled.    

## 2022-12-30 ENCOUNTER — Telehealth (HOSPITAL_COMMUNITY): Payer: Self-pay | Admitting: Cardiology

## 2022-12-30 LAB — PROTEIN ELECTROPHORESIS, SERUM
A/G Ratio: 1.2 (ref 0.7–1.7)
Albumin ELP: 3.8 g/dL (ref 2.9–4.4)
Alpha-1-Globulin: 0.2 g/dL (ref 0.0–0.4)
Alpha-2-Globulin: 0.8 g/dL (ref 0.4–1.0)
Beta Globulin: 1.1 g/dL (ref 0.7–1.3)
Gamma Globulin: 1.1 g/dL (ref 0.4–1.8)
Globulin, Total: 3.3 g/dL (ref 2.2–3.9)
Total Protein ELP: 7.1 g/dL (ref 6.0–8.5)

## 2022-12-30 LAB — COPPER, SERUM: Copper: 91 ug/dL (ref 80–158)

## 2022-12-30 NOTE — Telephone Encounter (Signed)
Tallon Gertz with [facility] to request a clearance for upcoming dental procedure Please call [phone]

## 2022-12-31 ENCOUNTER — Telehealth (HOSPITAL_COMMUNITY): Payer: Self-pay

## 2022-12-31 DIAGNOSIS — H40053 Ocular hypertension, bilateral: Secondary | ICD-10-CM | POA: Diagnosis not present

## 2022-12-31 NOTE — Telephone Encounter (Signed)
Surgical Clearance faxed back to [facility]. I called the office and confirmed receipt.

## 2023-01-01 LAB — METHYLMALONIC ACID, SERUM: Methylmalonic Acid, Quantitative: 617 nmol/L — ABNORMAL HIGH (ref 0–378)

## 2023-01-04 ENCOUNTER — Other Ambulatory Visit (HOSPITAL_COMMUNITY)
Admission: RE | Admit: 2023-01-04 | Discharge: 2023-01-04 | Disposition: A | Discharge status: Home / Self Care | Payer: PPO | Source: Ambulatory Visit | Attending: Hematology | Admitting: Hematology

## 2023-01-04 DIAGNOSIS — I2781 Cor pulmonale (chronic): Secondary | ICD-10-CM | POA: Diagnosis not present

## 2023-01-04 DIAGNOSIS — E876 Hypokalemia: Secondary | ICD-10-CM | POA: Diagnosis not present

## 2023-01-04 DIAGNOSIS — N184 Chronic kidney disease, stage 4 (severe): Secondary | ICD-10-CM | POA: Diagnosis not present

## 2023-01-04 DIAGNOSIS — F32A Depression, unspecified: Secondary | ICD-10-CM | POA: Diagnosis not present

## 2023-01-04 DIAGNOSIS — I429 Cardiomyopathy, unspecified: Secondary | ICD-10-CM | POA: Diagnosis not present

## 2023-01-04 DIAGNOSIS — Z7982 Long term (current) use of aspirin: Secondary | ICD-10-CM | POA: Diagnosis not present

## 2023-01-04 DIAGNOSIS — Z7984 Long term (current) use of oral hypoglycemic drugs: Secondary | ICD-10-CM | POA: Diagnosis not present

## 2023-01-04 DIAGNOSIS — N179 Acute kidney failure, unspecified: Secondary | ICD-10-CM | POA: Diagnosis not present

## 2023-01-04 DIAGNOSIS — I130 Hypertensive heart and chronic kidney disease with heart failure and stage 1 through stage 4 chronic kidney disease, or unspecified chronic kidney disease: Secondary | ICD-10-CM | POA: Diagnosis not present

## 2023-01-04 DIAGNOSIS — K219 Gastro-esophageal reflux disease without esophagitis: Secondary | ICD-10-CM | POA: Diagnosis not present

## 2023-01-04 DIAGNOSIS — I5023 Acute on chronic systolic (congestive) heart failure: Secondary | ICD-10-CM | POA: Diagnosis not present

## 2023-01-04 DIAGNOSIS — E785 Hyperlipidemia, unspecified: Secondary | ICD-10-CM | POA: Diagnosis not present

## 2023-01-04 DIAGNOSIS — D509 Iron deficiency anemia, unspecified: Secondary | ICD-10-CM | POA: Diagnosis not present

## 2023-01-04 DIAGNOSIS — I2510 Atherosclerotic heart disease of native coronary artery without angina pectoris: Secondary | ICD-10-CM | POA: Diagnosis not present

## 2023-01-04 DIAGNOSIS — Z96641 Presence of right artificial hip joint: Secondary | ICD-10-CM | POA: Diagnosis not present

## 2023-01-04 DIAGNOSIS — E1142 Type 2 diabetes mellitus with diabetic polyneuropathy: Secondary | ICD-10-CM | POA: Diagnosis not present

## 2023-01-04 DIAGNOSIS — F0393 Unspecified dementia, unspecified severity, with mood disturbance: Secondary | ICD-10-CM | POA: Diagnosis not present

## 2023-01-04 DIAGNOSIS — I5082 Biventricular heart failure: Secondary | ICD-10-CM | POA: Diagnosis not present

## 2023-01-04 DIAGNOSIS — I272 Pulmonary hypertension, unspecified: Secondary | ICD-10-CM | POA: Diagnosis not present

## 2023-01-04 DIAGNOSIS — E1122 Type 2 diabetes mellitus with diabetic chronic kidney disease: Secondary | ICD-10-CM | POA: Diagnosis not present

## 2023-01-04 DIAGNOSIS — I340 Nonrheumatic mitral (valve) insufficiency: Secondary | ICD-10-CM | POA: Diagnosis not present

## 2023-01-04 DIAGNOSIS — I700 Atherosclerosis of aorta: Secondary | ICD-10-CM | POA: Diagnosis not present

## 2023-01-04 DIAGNOSIS — D631 Anemia in chronic kidney disease: Secondary | ICD-10-CM | POA: Diagnosis not present

## 2023-01-04 LAB — OCCULT BLOOD X 1 CARD TO LAB, STOOL
Fecal Occult Bld: NEGATIVE
Fecal Occult Bld: NEGATIVE
Fecal Occult Bld: NEGATIVE

## 2023-01-05 ENCOUNTER — Inpatient Hospital Stay: Payer: PPO

## 2023-01-05 VITALS — BP 124/70 | HR 56 | Temp 96.6°F | Resp 18

## 2023-01-05 DIAGNOSIS — D509 Iron deficiency anemia, unspecified: Secondary | ICD-10-CM

## 2023-01-05 MED ORDER — CETIRIZINE HCL 10 MG PO TABS
10.0000 mg | ORAL_TABLET | Freq: Once | ORAL | Status: AC
  Administered 2023-01-05: 10 mg via ORAL
  Filled 2023-01-05: qty 1

## 2023-01-05 MED ORDER — SODIUM CHLORIDE 0.9 % IV SOLN: Freq: Once | INTRAVENOUS | Status: AC

## 2023-01-05 MED ORDER — ACETAMINOPHEN 325 MG PO TABS
650.0000 mg | ORAL_TABLET | Freq: Once | ORAL | Status: AC
  Administered 2023-01-05: 650 mg via ORAL
  Filled 2023-01-05: qty 2

## 2023-01-05 MED ORDER — SODIUM CHLORIDE 0.9 % IV SOLN
510.0000 mg | Freq: Once | INTRAVENOUS | Status: AC
  Administered 2023-01-05: 510 mg via INTRAVENOUS
  Filled 2023-01-05: qty 510

## 2023-01-05 NOTE — Progress Notes (Signed)
Patient presents today for Feraheme infusion per providers order.  Vital signs WNL.  Patient has no new complaints at this time.  Peripheral IV started and blood return noted pre and post infusion.  Stable during infusion without adverse affects.  Vital signs stable.  No complaints at this time.  Discharge from clinic ambulatory in stable condition.  Alert and oriented X 3.  Follow up with [facility] as scheduled.  

## 2023-01-05 NOTE — Telephone Encounter (Signed)
Faxed back per [place] kid, cma

## 2023-01-05 NOTE — Patient Instructions (Signed)
[facility]  Discharge Instructions: Thank you for choosing [facility] to provide your oncology and hematology care.  If you have a lab appointment with the Cancer Center, please come in thru the Main Entrance and check in at the main information desk.  Wear comfortable clothing and clothing appropriate for easy access to any Portacath or PICC line.   We strive to give you quality time with your provider. You may need to reschedule your appointment if you arrive late (15 or more minutes).  Arriving late affects you and other patients whose appointments are after yours.  Also, if you miss three or more appointments without notifying the office, you may be dismissed from the clinic at the provider's discretion.      For prescription refill requests, have your pharmacy contact our office and allow 72 hours for refills to be completed.    Today you received the following chemotherapy and/or immunotherapy agents Feraheme      To help prevent nausea and vomiting after your treatment, we encourage you to take your nausea medication as directed.  BELOW ARE SYMPTOMS THAT SHOULD BE REPORTED IMMEDIATELY: *FEVER GREATER THAN 100.4 F (38 C) OR HIGHER *CHILLS OR SWEATING *NAUSEA AND VOMITING THAT IS NOT CONTROLLED WITH YOUR NAUSEA MEDICATION *UNUSUAL SHORTNESS OF BREATH *UNUSUAL BRUISING OR BLEEDING *URINARY PROBLEMS (pain or burning when urinating, or frequent urination) *BOWEL PROBLEMS (unusual diarrhea, constipation, pain near the anus) TENDERNESS IN MOUTH AND THROAT WITH OR WITHOUT PRESENCE OF ULCERS (sore throat, sores in mouth, or a toothache) UNUSUAL RASH, SWELLING OR PAIN  UNUSUAL VAGINAL DISCHARGE OR ITCHING   Items with * indicate a potential emergency and should be followed up as soon as possible or go to the Emergency Department if any problems should occur.  Please show the CHEMOTHERAPY ALERT CARD or IMMUNOTHERAPY ALERT CARD at check-in to the  Emergency Department and triage nurse.  Should you have questions after your visit or need to cancel or reschedule your appointment, please contact [facility] [phone]  and follow the prompts.  Office hours are 8:00 a.m. to 4:30 p.m. Monday - Friday. Please note that voicemails left after 4:00 p.m. may not be returned until the following business day.  We are closed weekends and major holidays. You have access to a nurse at all times for urgent questions. Please call the main number to the clinic [phone] and follow the prompts.  For any non-urgent questions, you may also contact your provider using MyChart. We now offer e-Visits for anyone 18 and older to request care online for non-urgent symptoms. For details visit [email or web address].   Also download the MyChart app! Go to the app store, search "MyChart", open the app, select [facility], and log in with your MyChart username and password.   

## 2023-01-07 ENCOUNTER — Inpatient Hospital Stay (HOSPITAL_BASED_OUTPATIENT_CLINIC_OR_DEPARTMENT_OTHER): Payer: PPO | Admitting: Oncology

## 2023-01-07 ENCOUNTER — Other Ambulatory Visit: Payer: PPO

## 2023-01-07 ENCOUNTER — Inpatient Hospital Stay: Payer: PPO

## 2023-01-07 VITALS — BP 119/63 | HR 72 | Temp 98.8°F | Resp 18 | Ht 63.5 in | Wt 137.9 lb

## 2023-01-07 DIAGNOSIS — D509 Iron deficiency anemia, unspecified: Secondary | ICD-10-CM | POA: Diagnosis not present

## 2023-01-07 DIAGNOSIS — R768 Other specified abnormal immunological findings in serum: Secondary | ICD-10-CM | POA: Diagnosis not present

## 2023-01-07 DIAGNOSIS — E538 Deficiency of other specified B group vitamins: Secondary | ICD-10-CM

## 2023-01-07 MED ORDER — CYANOCOBALAMIN 1000 MCG/ML IJ SOLN
1000.0000 ug | Freq: Once | INTRAMUSCULAR | Status: AC
  Administered 2023-01-07: 1000 ug via INTRAMUSCULAR

## 2023-01-07 NOTE — Progress Notes (Signed)
[facility] [address], [place] [address]   Clinic Day:  01/07/2023  Referring physician: Caryl Bis, MD  Patient Care Team: Derek Jack, MD as PCP - General (Hematology) Derek Jack, MD as Medical Oncologist (Hematology)   ASSESSMENT & PLAN:   Assessment:  1.  Severe microcytic anemia from iron deficiency: - CBC (11/06/2022): Hb-10.1, MCV-73.3 - 10/30/2022: Ferritin-12, percent saturation 12 - Feraheme x 2 (3/19 and 3/26) - She is not on oral iron therapy.  No prior history of blood transfusion.  Denies any bleeding per rectum or melena. - Last colonoscopy at [facility] long time ago.  She thinks she had at least 5 colonoscopies.  2.  Social/family history: - Lives with a friend at home.  She is independent of ADLs and IADLs.  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  Non-smoker. - Son has anemia.  Mother had ovarian cancer.  Father had prostate cancer.  Sister had melanoma.  Plan:  1.  Microcytic anemia from iron deficiency: -Lab work from 12/25/2022 showed a vitamin B12 level of 118 with an MMA of 617.  Ferritin is low with iron saturation of 15%.  Copper is normal.  Hemoglobin has normalized at 13.5.  Differential is normal.  -Given 2 doses of IV feraheme on 12/29/22 and 01/05/23 and 1 while she was hospitalized in January. -Fecal occult x 3 negative. - CKD ranging from normal -1.37. -Elevated kappa lambda free light chain.  Kappa lambda light chain ratio is 1.83.  M spike not observed.  Likely secondary to CKD but will add on immunofixation electrophoresis for further evaluation of elevated light chains. -Recommend starting B12 injections weekly x 4 then monthly x 6. -First B12 injection today.  She will return weekly for total of 4 B12 injections and then monthly for 6 months. -Return to clinic in 3 months for lab work (immunofixation, B12 iron studies and CBC) and to see MD.   I spent 25  minutes dedicated to the care of this patient (face-to-face and non-face-to-face) on the date of the encounter to include what is described in the assessment and plan.    No orders of the defined types were placed in this encounter.    Jacquelin Hawking, NP   3/28/202412:50 PM  CHIEF COMPLAINT/PURPOSE OF CONSULT:   Diagnosis: Microcytic anemia from iron deficiency  Current Therapy: Feraheme   HISTORY OF PRESENT ILLNESS:   Erica Cervantes is a 77 y.o. female presenting to clinic today for evaluation of anemia at the request of NP Scherrie Gerlach--.  Patient was seen in the ED in [place] on 10/20/22 for shortness of breath and generalized weakness. Her work-up revealed mild cardiac enlargement with mild pulmonary vascular congestion and stable vitals, particularly SPO2 of 98% on RA. Her CBC during her ED visit showed a HGB of 9.6, HCT of 32.5, MCV of 74.7, and platelets of 537. Her symptoms were attributed to her anemia as her other testing, including rapid flu, RSV, and COVID were negative. She was discharged home and recommended to follow up with her PCP for further work-up.  Patient was admitted for 8 days beginning on 10/29/22 for heart failure exacerbation. 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  Per review of Dr. Riccardo Dubin Arrien's discharge summary from 11/08/22, her work-up showed: Na 137, K 4.2 Cl 102, bicarbonate 23, glucose 197 bun 29 cr 1.0  BNP 1,377 High sensitive troponin 23 and 28  Wbc 11.1 hgb 9,4 plt 453  Urine analysis SG 1,012, 30 protein, small leukocytes.  Sars covid 19 and influenza negative.  Chest radiograph with cardiomegaly with bilateral hilar vascular congestion and bilateral pleural effusions. CT chest with bilateral ground glass opacities, with bilateral pleural effusions.  EKG 109 bpm, right axis deviation, normal intervals, sinus rhythm with no significant ST segment or T wave  changes.  She was diuresed with improvement of her condition, and was able to be discharged home with outpatient CV follow up.  Today, she states that she is doing well overall. Her appetite level is at 65%. Her energy level is at 65%.  No further hospitalizations.  Does not feel significantly different after iron infusions.  Has daily fatigue and occasional constipation.  Not taking oral iron supplements.  Takes at least 1 nap per day.  Feels like her urine smells strong.  Drinking plenty of fluids.  Has occasional numbness in toes. She denies any ice pica.  Denies any bleeding per rectum or melena.  No prior history of blood transfusion.  She has received a total of 3 doses of IV iron 1 in January and 2 in March.  Tolerated infusions well.  She is here to review lab work with her sister.  PAST MEDICAL HISTORY:   Past Medical History: Past Medical History:  Diagnosis Date   Chronic kidney disease    stage 4   Depression    Diabetes mellitus without complication (HCC)    GERD (gastroesophageal reflux disease)    Hypertension    Neuromuscular disorder ([place])    peripheral neuropathy    Surgical History: Past Surgical History:  Procedure Laterality Date   ABDOMINAL HYSTERECTOMY  68   CHOLECYSTECTOMY     age 68   DILATION AND CURETTAGE OF UTERUS     x 4  1980   RIGHT/LEFT HEART CATH AND CORONARY ANGIOGRAPHY N/A 10/30/2022   Procedure: RIGHT/LEFT HEART CATH AND CORONARY ANGIOGRAPHY;  Surgeon: Jolaine Artist, MD;  Location: [facility];  Service: Cardiovascular;  Laterality: N/A;   TONSILLECTOMY     age 20   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   Procedure: TOTAL HIP ARTHROPLASTY ANTERIOR APPROACH;  Surgeon: Dorna Leitz, MD;  Location: [facility];  Service: Orthopedics;  Laterality: Right;    Social History: Social History   Socioeconomic History   Marital status: Single    Spouse name: Not on file   Number of children: Not on file   Years of education: Not on file    Highest education level: Not on file  Occupational History   Not on file  Tobacco Use   Smoking status: Never    Passive exposure: Never   Smokeless tobacco: Never  Vaping Use   Vaping Use: Never used  Substance and Sexual Activity   Alcohol use: No   Drug use: No   Sexual activity: Not on file  Other Topics Concern   Not on file  Social History Narrative   Not on file   Social Determinants of Health   Financial Resource Strain: Not on file  Food Insecurity: No Food Insecurity (12/25/2022)   Hunger Vital Sign    Worried About Running Out of Food in the Last Year: Never true    Ran Out of Food in the Last Year: Never true  Transportation Needs: No Transportation Needs (12/25/2022)   PRAPARE - Hydrologist (Medical):  No    Lack of Transportation (Non-Medical): No  Physical Activity: Not on file  Stress: Not on file  Social Connections: Not on file  Intimate Partner Violence: Not At Risk (12/25/2022)   Humiliation, Afraid, Rape, and Kick questionnaire    Fear of Current or Ex-Partner: No    Emotionally Abused: No    Physically Abused: No    Sexually Abused: No    Family History: Family History  Problem Relation Age of Onset   Cancer Mother    Cancer Father    Heart attack Brother    Polycystic kidney disease Brother    Cancer Other     Current Medications:  Current Outpatient Medications:    amphetamine-dextroamphetamine (ADDERALL) 20 MG tablet, Take 20-40 mg by mouth See admin instructions. Take 40 mg (2 tablets) by mouth in the morning and 20 mg (1 tablet) at 2 pm, Disp: , Rfl:    aspirin EC 81 MG tablet, Take 1 tablet (81 mg total) by mouth daily. Swallow whole., Disp: 30 tablet, Rfl: 12   atorvastatin (LIPITOR) 10 MG tablet, Take 10 mg by mouth every Wednesday., Disp: , Rfl:    busPIRone (BUSPAR) 30 MG tablet, Take 30 mg by mouth 3 (three) times daily., Disp: , Rfl:    Calcium Carb-Cholecalciferol (CALCIUM 500+D PO), Take 1 tablet by  mouth daily., Disp: , Rfl:    cholecalciferol (VITAMIN D3) 25 MCG (1000 UNIT) tablet, Take 2,000 Units by mouth daily., Disp: , Rfl:    cycloSPORINE (RESTASIS) 0.05 % ophthalmic emulsion, Place 2 drops into both eyes 2 (two) times daily., Disp: , Rfl:    digoxin (LANOXIN) 0.125 MG tablet, Take 0.5 tablets (0.0625 mg total) by mouth daily., Disp: 15 tablet, Rfl: 0   donepezil (ARICEPT) 5 MG tablet, Take 5 mg by mouth at bedtime., Disp: , Rfl:    DULoxetine (CYMBALTA) 60 MG capsule, Take 120 mg by mouth daily at 12 noon., Disp: , Rfl:    folic acid (FOLVITE) Q000111Q MCG tablet, Take 800-2,400 mcg by mouth daily., Disp: , Rfl:    furosemide (LASIX) 40 MG tablet, Take 1 tablet (40 mg total) by mouth daily., Disp: 30 tablet, Rfl: 0   Guaifenesin (MUCINEX MAXIMUM STRENGTH) 1200 MG TB12, Take 1,200 mg by mouth daily as needed (severe congestion)., Disp: , Rfl:    losartan (COZAAR) 25 MG tablet, Take 1 tablet (25 mg total) by mouth at bedtime., Disp: 30 tablet, Rfl: 8   metFORMIN (GLUCOPHAGE-XR) 500 MG 24 hr tablet, Take 1,000 mg by mouth 2 (two) times daily., Disp: , Rfl:    metoprolol succinate (TOPROL XL) 25 MG 24 hr tablet, Take 1 tablet (25 mg total) by mouth daily., Disp: 60 tablet, Rfl: 5   Multiple Minerals-Vitamins (CAL MAG ZINC +D3) TABS, Take 1 tablet by mouth daily., Disp: , Rfl:    omeprazole (PRILOSEC) 20 MG capsule, Take 20 mg by mouth daily., Disp: , Rfl:    Probiotic Product (PROBIOTIC PO), Take 1 capsule by mouth daily., Disp: , Rfl:    spironolactone (ALDACTONE) 25 MG tablet, Take 1 tablet (25 mg total) by mouth daily., Disp: 30 tablet, Rfl: 0   triamcinolone cream (KENALOG) 0.1 %, Apply 1 application. topically 3 (three) times daily as needed for itching or rash., Disp: , Rfl:    Allergies: Allergies  Allergen Reactions   Sulfa Antibiotics Shortness Of Breath   Ace Inhibitors Cough    Pt tolerates lisinopril    Codeine Itching and Swelling  Penicillins     Unknown reaction      Statins     Body aches    Tramadol     Numbness    Zetia [Ezetimibe]     Myalgia    Neosporin [Neomycin-Bacitracin Zn-Polymyx] Rash    REVIEW OF SYSTEMS:   Review of Systems  Constitutional:  Positive for fatigue. Negative for chills and fever.  HENT:   Negative for lump/mass, mouth sores, nosebleeds, sore throat and trouble swallowing.   Eyes:  Negative for eye problems.  Respiratory:  Negative for shortness of breath.   Cardiovascular:  Negative for chest pain, leg swelling and palpitations.  Gastrointestinal:  Positive for constipation. Negative for abdominal pain, diarrhea, nausea and vomiting.  Genitourinary:  Negative for bladder incontinence, difficulty urinating, dysuria, frequency, hematuria and nocturia.   Musculoskeletal:  Negative for arthralgias, back pain, flank pain, myalgias and neck pain.  Skin:  Negative for itching and rash.  Neurological:  Positive for dizziness, headaches and numbness (Feet).  Hematological:  Does not bruise/bleed easily.  Psychiatric/Behavioral:  Positive for sleep disturbance. Negative for depression and suicidal ideas. The patient is not nervous/anxious.   All other systems reviewed and are negative.    VITALS:   There were no vitals taken for this visit.  Wt Readings from Last 3 Encounters:  12/29/22 138 lb 9.6 oz (62.9 kg)  12/25/22 139 lb 3.2 oz (63.1 kg)  12/04/22 140 lb 6.4 oz (63.7 kg)    There is no height or weight on file to calculate BMI.   PHYSICAL EXAM:   Physical Exam Constitutional:      Appearance: Normal appearance.  HENT:     Head: Normocephalic and atraumatic.  Eyes:     Pupils: Pupils are equal, round, and reactive to light.  Cardiovascular:     Rate and Rhythm: Normal rate and regular rhythm.     Heart sounds: Normal heart sounds. No murmur heard. Pulmonary:     Effort: Pulmonary effort is normal.     Breath sounds: Normal breath sounds. No wheezing.  Abdominal:     General: Bowel sounds are normal.  There is no distension.     Palpations: Abdomen is soft.     Tenderness: There is no abdominal tenderness.  Musculoskeletal:        General: Normal range of motion.     Cervical back: Normal range of motion.  Skin:    General: Skin is warm and dry.     Findings: No rash.  Neurological:     Mental Status: She is alert and oriented to person, place, and time.  Psychiatric:        Judgment: Judgment normal.     LABS:      Latest Ref Rng & Units 12/25/2022   11:43 AM 11/06/2022    2:31 AM 11/05/2022    1:56 AM  CBC  WBC 4.0 - 10.5 K/uL 8.0  11.6  9.6   Hemoglobin 12.0 - 15.0 g/dL 13.5  10.1  9.3   Hematocrit 36.0 - 46.0 % 44.6  33.8  31.1   Platelets 150 - 400 K/uL 368  395  349       Latest Ref Rng & Units 12/04/2022    3:58 PM 11/10/2022    4:11 PM 11/06/2022    2:31 AM  CMP  Glucose 70 - 99 mg/dL 106  125  165   BUN 8 - 23 mg/dL 34  31  31   Creatinine 0.44 - 1.00  mg/dL 1.10  1.20  1.09   Sodium 135 - 145 mmol/L 137  136  137   Potassium 3.5 - 5.1 mmol/L 4.1  4.1  3.9   Chloride 98 - 111 mmol/L 102  100  100   CO2 22 - 32 mmol/L 27  26  27    Calcium 8.9 - 10.3 mg/dL 9.5  10.1  9.5      No results found for: "CEA1", "CEA" / No results found for: "CEA1", "CEA" No results found for: "PSA1" No results found for: "[id number]" No results found for: "CAN125"  Lab Results  Component Value Date   TOTALPROTELP 7.1 12/25/2022   ALBUMINELP 3.8 12/25/2022   A1GS 0.2 12/25/2022   A2GS 0.8 12/25/2022   BETS 1.1 12/25/2022   GAMS 1.1 12/25/2022   MSPIKE Not Observed 12/25/2022   SPEI Comment 12/25/2022   Lab Results  Component Value Date   TIBC 417 12/25/2022   TIBC 378 10/30/2022   FERRITIN 18 12/25/2022   FERRITIN 12 10/30/2022   IRONPCTSAT 15 12/25/2022   IRONPCTSAT 12 10/30/2022   Lab Results  Component Value Date   LDH 134 12/25/2022     STUDIES:   No results found.

## 2023-01-07 NOTE — Patient Instructions (Signed)
[facility]  Discharge Instructions: Thank you for choosing [facility] to provide your oncology and hematology care.  If you have a lab appointment with the [facility], please come in thru the Main Entrance and check in at the main information desk.  Wear comfortable clothing and clothing appropriate for easy access to any Portacath or PICC line.   We strive to give you quality time with your provider. You may need to reschedule your appointment if you arrive late (15 or more minutes).  Arriving late affects you and other patients whose appointments are after yours.  Also, if you miss three or more appointments without notifying the office, you may be dismissed from the clinic at the provider's discretion.      For prescription refill requests, have your pharmacy contact our office and allow 72 hours for refills to be completed.    Today you received B-12 injection.    To help prevent nausea and vomiting after your treatment, we encourage you to take your nausea medication as directed.  BELOW ARE SYMPTOMS THAT SHOULD BE REPORTED IMMEDIATELY: *FEVER GREATER THAN 100.4 F (38 C) OR HIGHER *CHILLS OR SWEATING *NAUSEA AND VOMITING THAT IS NOT CONTROLLED WITH YOUR NAUSEA MEDICATION *UNUSUAL SHORTNESS OF BREATH *UNUSUAL BRUISING OR BLEEDING *URINARY PROBLEMS (pain or burning when urinating, or frequent urination) *BOWEL PROBLEMS (unusual diarrhea, constipation, pain near the anus) TENDERNESS IN MOUTH AND THROAT WITH OR WITHOUT PRESENCE OF ULCERS (sore throat, sores in mouth, or a toothache) UNUSUAL RASH, SWELLING OR PAIN  UNUSUAL VAGINAL DISCHARGE OR ITCHING   Items with * indicate a potential emergency and should be followed up as soon as possible or go to the Emergency Department if any problems should occur.  Please show the CHEMOTHERAPY ALERT CARD or IMMUNOTHERAPY ALERT CARD at check-in to the Emergency Department and triage nurse.  Should you have  questions after your visit or need to cancel or reschedule your appointment, please contact [facility] [phone]  and follow the prompts.  Office hours are 8:00 a.m. to 4:30 p.m. Monday - Friday. Please note that voicemails left after 4:00 p.m. may not be returned until the following business day.  We are closed weekends and major holidays. You have access to a nurse at all times for urgent questions. Please call the main number to the clinic [phone] and follow the prompts.  For any non-urgent questions, you may also contact your provider using MyChart. We now offer e-Visits for anyone 23 and older to request care online for non-urgent symptoms. For details visit [email or web address].   Also download the MyChart app! Go to the app store, search "MyChart", open the app, select [facility], and log in with your MyChart username and password.

## 2023-01-07 NOTE — Progress Notes (Signed)
Patient in clinic today for follow up visit. NP ordered B-12 injection. See MAR for injection information. Patient remained stable throughout injection. Patient discharged from clinic ambulatory and in stable condition.

## 2023-01-12 ENCOUNTER — Encounter: Payer: Self-pay | Admitting: Hematology

## 2023-01-15 ENCOUNTER — Inpatient Hospital Stay: Payer: PPO | Attending: Hematology

## 2023-01-15 VITALS — BP 125/68 | HR 70 | Temp 96.9°F | Resp 18

## 2023-01-15 DIAGNOSIS — D509 Iron deficiency anemia, unspecified: Secondary | ICD-10-CM

## 2023-01-15 DIAGNOSIS — E538 Deficiency of other specified B group vitamins: Secondary | ICD-10-CM | POA: Insufficient documentation

## 2023-01-15 MED ORDER — CYANOCOBALAMIN 1000 MCG/ML IJ SOLN
1000.0000 ug | Freq: Once | INTRAMUSCULAR | Status: AC
  Administered 2023-01-15: 1000 ug via INTRAMUSCULAR
  Filled 2023-01-15: qty 1

## 2023-01-15 NOTE — Patient Instructions (Signed)
[facility]  Discharge Instructions: Thank you for choosing [facility] to provide your oncology and hematology care.  If you have a lab appointment with the Cancer Center - please note that after April 8th, 2024, all labs will be drawn in the cancer center.  You do not have to check in or register with the main entrance as you have in the past but will complete your check-in in the cancer center.  Wear comfortable clothing and clothing appropriate for easy access to any Portacath or PICC line.   We strive to give you quality time with your provider. You may need to reschedule your appointment if you arrive late (15 or more minutes).  Arriving late affects you and other patients whose appointments are after yours.  Also, if you miss three or more appointments without notifying the office, you may be dismissed from the clinic at the provider's discretion.      For prescription refill requests, have your pharmacy contact our office and allow 72 hours for refills to be completed.    Today you received the following B12 injection.   Vitamin B12 Injection What is this medication? Vitamin B12 (VAHY tuh min B12) prevents and treats low vitamin B12 levels in your body. It is used in people who do not get enough vitamin B12 from their diet or when their digestive tract does not absorb enough. Vitamin B12 plays an important role in maintaining the health of your nervous system and red blood cells. This medicine may be used for other purposes; ask your health care provider or pharmacist if you have questions. COMMON BRAND NAME(S): B-12 Compliance Kit, B-12 Injection Kit, Cyomin, Dodex, LA-12, Nutri-Twelve, Physicians EZ Use B-12, Primabalt What should I tell my care team before I take this medication? They need to know if you have any of these conditions: Kidney disease Leber's disease Megaloblastic anemia An unusual or allergic reaction to cyanocobalamin, cobalt, other  medications, foods, dyes, or preservatives Pregnant or trying to get pregnant Breast-feeding How should I use this medication? This medication is injected into a muscle or deeply under the skin. It is usually given in a clinic or care team's office. However, your care team may teach you how to inject yourself. Follow all instructions. Talk to your care team about the use of this medication in children. Special care may be needed. Overdosage: If you think you have taken too much of this medicine contact a poison control center or emergency room at once. NOTE: This medicine is only for you. Do not share this medicine with others. What if I miss a dose? If you are given your dose at a clinic or care team's office, call to reschedule your appointment. If you give your own injections, and you miss a dose, take it as soon as you can. If it is almost time for your next dose, take only that dose. Do not take double or extra doses. What may interact with this medication? Alcohol Colchicine This list may not describe all possible interactions. Give your health care provider a list of all the medicines, herbs, non-prescription drugs, or dietary supplements you use. Also tell them if you smoke, drink alcohol, or use illegal drugs. Some items may interact with your medicine. What should I watch for while using this medication? Visit your care team regularly. You may need blood work done while you are taking this medication. You may need to follow a special diet. Talk to your care  Limit your alcohol intake and avoid smoking to get the best benefit. What side effects may I notice from receiving this medication? Side effects that you should report to your care team as soon as possible: Allergic reactions--skin rash, itching, hives, swelling of the face, lips, tongue, or throat Swelling of the ankles, hands, or feet Trouble breathing Side effects that usually do not require medical attention (report to  your care team if they continue or are bothersome): Diarrhea This list may not describe all possible side effects. Call your doctor for medical advice about side effects. You may report side effects to FDA at 1-800-FDA-1088. Where should I keep my medication? Keep out of the reach of children. Store at room temperature between 15 and 30 degrees C (59 and 85 degrees F). Protect from light. Throw away any unused medication after the expiration date. NOTE: This sheet is a summary. It may not cover all possible information. If you have questions about this medicine, talk to your doctor, pharmacist, or health care provider.  2023 Elsevier/Gold Standard (2021-06-10 00:00:00)    To help prevent nausea and vomiting after your treatment, we encourage you to take your nausea medication as directed.  BELOW ARE SYMPTOMS THAT SHOULD BE REPORTED IMMEDIATELY: *FEVER GREATER THAN 100.4 F (38 C) OR HIGHER *CHILLS OR SWEATING *NAUSEA AND VOMITING THAT IS NOT CONTROLLED WITH YOUR NAUSEA MEDICATION *UNUSUAL SHORTNESS OF BREATH *UNUSUAL BRUISING OR BLEEDING *URINARY PROBLEMS (pain or burning when urinating, or frequent urination) *BOWEL PROBLEMS (unusual diarrhea, constipation, pain near the anus) TENDERNESS IN MOUTH AND THROAT WITH OR WITHOUT PRESENCE OF ULCERS (sore throat, sores in mouth, or a toothache) UNUSUAL RASH, SWELLING OR PAIN  UNUSUAL VAGINAL DISCHARGE OR ITCHING   Items with * indicate a potential emergency and should be followed up as soon as possible or go to the Emergency Department if any problems should occur.  Please show the CHEMOTHERAPY ALERT CARD or IMMUNOTHERAPY ALERT CARD at check-in to the Emergency Department and triage nurse.  Should you have questions after your visit or need to cancel or reschedule your appointment, please contact [facility] [place] [phone]  and follow the prompts.  Office hours are 8:00 a.m. to 4:30 p.m. Monday - Friday. Please note that  voicemails left after 4:00 p.m. may not be returned until the following business day.  We are closed weekends and major holidays. You have access to a nurse at all times for urgent questions. Please call the main number to the clinic [phone] and follow the prompts.  For any non-urgent questions, you may also contact your provider using MyChart. We now offer e-Visits for anyone 18 and older to request care online for non-urgent symptoms. For details visit [email or web address].   Also download the MyChart app! Go to the app store, search "MyChart", open the app, select [facility], and log in with your MyChart username and password.   

## 2023-01-15 NOTE — Progress Notes (Signed)
Patient tolerated B12 injection with no complaints voiced.  Site clean and dry with no bruising or swelling noted.  No complaints of pain.  Discharged with vital signs stable and no signs or symptoms of distress noted.  

## 2023-01-16 DIAGNOSIS — Z6825 Body mass index (BMI) 25.0-25.9, adult: Secondary | ICD-10-CM | POA: Diagnosis not present

## 2023-01-16 DIAGNOSIS — R030 Elevated blood-pressure reading, without diagnosis of hypertension: Secondary | ICD-10-CM | POA: Diagnosis not present

## 2023-01-16 DIAGNOSIS — Z20828 Contact with and (suspected) exposure to other viral communicable diseases: Secondary | ICD-10-CM | POA: Diagnosis not present

## 2023-01-16 DIAGNOSIS — J0100 Acute maxillary sinusitis, unspecified: Secondary | ICD-10-CM | POA: Diagnosis not present

## 2023-01-22 ENCOUNTER — Inpatient Hospital Stay: Payer: PPO

## 2023-01-22 VITALS — BP 129/67 | HR 71 | Temp 96.4°F | Resp 18

## 2023-01-22 DIAGNOSIS — D509 Iron deficiency anemia, unspecified: Secondary | ICD-10-CM

## 2023-01-22 DIAGNOSIS — E538 Deficiency of other specified B group vitamins: Secondary | ICD-10-CM | POA: Diagnosis not present

## 2023-01-22 MED ORDER — CYANOCOBALAMIN 1000 MCG/ML IJ SOLN
1000.0000 ug | Freq: Once | INTRAMUSCULAR | Status: AC
  Administered 2023-01-22: 1000 ug via INTRAMUSCULAR
  Filled 2023-01-22: qty 1

## 2023-01-22 NOTE — Progress Notes (Signed)
Erica Cervantes presents today for B12 injection per the provider's orders.  Stable during administration without incident; injection site WNL; see MAR for injection details.  Patient tolerated procedure well and without incident.  No questions or complaints noted at this time.

## 2023-01-22 NOTE — Patient Instructions (Signed)
[facility] [place]  Discharge Instructions: Thank you for choosing [facility] to provide your oncology and hematology care.  If you have a lab appointment with the Cancer Center - please note that after April 8th, 2024, all labs will be drawn in the cancer center.  You do not have to check in or register with the main entrance as you have in the past but will complete your check-in in the cancer center.  Wear comfortable clothing and clothing appropriate for easy access to any Portacath or PICC line.   We strive to give you quality time with your provider. You may need to reschedule your appointment if you arrive late (15 or more minutes).  Arriving late affects you and other patients whose appointments are after yours.  Also, if you miss three or more appointments without notifying the office, you may be dismissed from the clinic at the provider's discretion.      For prescription refill requests, have your pharmacy contact our office and allow 72 hours for refills to be completed.    Today you received the following chemotherapy and/or immunotherapy agents B12      To help prevent nausea and vomiting after your treatment, we encourage you to take your nausea medication as directed.  BELOW ARE SYMPTOMS THAT SHOULD BE REPORTED IMMEDIATELY: *FEVER GREATER THAN 100.4 F (38 C) OR HIGHER *CHILLS OR SWEATING *NAUSEA AND VOMITING THAT IS NOT CONTROLLED WITH YOUR NAUSEA MEDICATION *UNUSUAL SHORTNESS OF BREATH *UNUSUAL BRUISING OR BLEEDING *URINARY PROBLEMS (pain or burning when urinating, or frequent urination) *BOWEL PROBLEMS (unusual diarrhea, constipation, pain near the anus) TENDERNESS IN MOUTH AND THROAT WITH OR WITHOUT PRESENCE OF ULCERS (sore throat, sores in mouth, or a toothache) UNUSUAL RASH, SWELLING OR PAIN  UNUSUAL VAGINAL DISCHARGE OR ITCHING   Items with * indicate a potential emergency and should be followed up as soon as possible or go to the  Emergency Department if any problems should occur.  Please show the CHEMOTHERAPY ALERT CARD or IMMUNOTHERAPY ALERT CARD at check-in to the Emergency Department and triage nurse.  Should you have questions after your visit or need to cancel or reschedule your appointment, please contact [facility] [place] [phone]  and follow the prompts.  Office hours are 8:00 a.m. to 4:30 p.m. Monday - Friday. Please note that voicemails left after 4:00 p.m. may not be returned until the following business day.  We are closed weekends and major holidays. You have access to a nurse at all times for urgent questions. Please call the main number to the clinic [phone] and follow the prompts.  For any non-urgent questions, you may also contact your provider using MyChart. We now offer e-Visits for anyone 18 and older to request care online for non-urgent symptoms. For details visit [email or web address].   Also download the MyChart app! Go to the app store, search "MyChart", open the app, select [facility], and log in with your MyChart username and password.   

## 2023-01-29 ENCOUNTER — Inpatient Hospital Stay: Payer: PPO

## 2023-01-29 VITALS — BP 139/81 | HR 80 | Temp 96.8°F | Resp 18

## 2023-01-29 DIAGNOSIS — D509 Iron deficiency anemia, unspecified: Secondary | ICD-10-CM

## 2023-01-29 DIAGNOSIS — E538 Deficiency of other specified B group vitamins: Secondary | ICD-10-CM | POA: Diagnosis not present

## 2023-01-29 MED ORDER — CYANOCOBALAMIN 1000 MCG/ML IJ SOLN
1000.0000 ug | Freq: Once | INTRAMUSCULAR | Status: AC
  Administered 2023-01-29: 1000 ug via INTRAMUSCULAR
  Filled 2023-01-29: qty 1

## 2023-01-29 NOTE — Progress Notes (Signed)
Patient tolerated B12 injection with no complaints voiced. Site clean and dry with no bruising or swelling noted at site. See MAR for details. Band aid applied.  Patient stable during and after injection. VSS with discharge and left in satisfactory condition with no s/s of distress noted. 

## 2023-01-29 NOTE — Patient Instructions (Signed)
[facility] [place]  Discharge Instructions: Thank you for choosing [facility] to provide your oncology and hematology care.  If you have a lab appointment with the Cancer Center - please note that after April 8th, 2024, all labs will be drawn in the cancer center.  You do not have to check in or register with the main entrance as you have in the past but will complete your check-in in the cancer center.  Wear comfortable clothing and clothing appropriate for easy access to any Portacath or PICC line.   We strive to give you quality time with your provider. You may need to reschedule your appointment if you arrive late (15 or more minutes).  Arriving late affects you and other patients whose appointments are after yours.  Also, if you miss three or more appointments without notifying the office, you may be dismissed from the clinic at the provider's discretion.      For prescription refill requests, have your pharmacy contact our office and allow 72 hours for refills to be completed.    Today you received the following B12 injection, return as scheduled.   To help prevent nausea and vomiting after your treatment, we encourage you to take your nausea medication as directed.  BELOW ARE SYMPTOMS THAT SHOULD BE REPORTED IMMEDIATELY: *FEVER GREATER THAN 100.4 F (38 C) OR HIGHER *CHILLS OR SWEATING *NAUSEA AND VOMITING THAT IS NOT CONTROLLED WITH YOUR NAUSEA MEDICATION *UNUSUAL SHORTNESS OF BREATH *UNUSUAL BRUISING OR BLEEDING *URINARY PROBLEMS (pain or burning when urinating, or frequent urination) *BOWEL PROBLEMS (unusual diarrhea, constipation, pain near the anus) TENDERNESS IN MOUTH AND THROAT WITH OR WITHOUT PRESENCE OF ULCERS (sore throat, sores in mouth, or a toothache) UNUSUAL RASH, SWELLING OR PAIN  UNUSUAL VAGINAL DISCHARGE OR ITCHING   Items with * indicate a potential emergency and should be followed up as soon as possible or go to the Emergency  Department if any problems should occur.  Please show the CHEMOTHERAPY ALERT CARD or IMMUNOTHERAPY ALERT CARD at check-in to the Emergency Department and triage nurse.  Should you have questions after your visit or need to cancel or reschedule your appointment, please contact [facility] [place] [phone]  and follow the prompts.  Office hours are 8:00 a.m. to 4:30 p.m. Monday - Friday. Please note that voicemails left after 4:00 p.m. may not be returned until the following business day.  We are closed weekends and major holidays. You have access to a nurse at all times for urgent questions. Please call the main number to the clinic [phone] and follow the prompts.  For any non-urgent questions, you may also contact your provider using MyChart. We now offer e-Visits for anyone 18 and older to request care online for non-urgent symptoms. For details visit [email or web address].   Also download the MyChart app! Go to the app store, search "MyChart", open the app, select [facility], and log in with your MyChart username and password.   

## 2023-02-04 ENCOUNTER — Encounter (INDEPENDENT_AMBULATORY_CARE_PROVIDER_SITE_OTHER): Payer: Self-pay | Admitting: Gastroenterology

## 2023-02-04 ENCOUNTER — Ambulatory Visit (INDEPENDENT_AMBULATORY_CARE_PROVIDER_SITE_OTHER): Payer: PPO | Admitting: Gastroenterology

## 2023-02-04 VITALS — BP 109/66 | HR 78 | Temp 97.9°F | Ht 63.5 in | Wt 138.0 lb

## 2023-02-04 DIAGNOSIS — D509 Iron deficiency anemia, unspecified: Secondary | ICD-10-CM | POA: Diagnosis not present

## 2023-02-04 NOTE — Progress Notes (Addendum)
Referring Provider: Richardean Chimera, MD Primary Care Physician:  Doreatha Massed, MD Primary GI Physician: Levon Hedger   Chief Complaint  Patient presents with   Anemia    Follow up on anemia. Not seeing any blood in stool, no sob does have some dizziness at times.    HPI:   Erica Cervantes is a 77 y.o. female with past medical history of  CKD stage III, depression, DM, GERD, HTN, peripheral, neuropathy, CHF   Patient presenting today for follow up of anemia   labs in January with hgb 10.1 TIBC 378, Iron 44, Iron sat 12%, ferritin 12, B12 297, folate >20  feraheme given 11/03/22   Last seen in February 2024, Notably had cardiac cath at the end of January, admitted to [facility] with SOB, fatigue and chest tightness. Echo showed EF 15%, transferred to [facility] for further workup.  diuresed with IV lasix and underwent R/LHC showing mild CAD, severe volume overload and hemodynamics consistent with CS. Started on IV lasix and milrinone. 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   At last visit she Noted anemia since November. Reports she did FOBT with PCP that was negative. Previously was not eating much at all prior to her cardiac event. She notes she has some constipation and sometimes has to manual disimpact herself. She started miralax over the past 3 days, 1 capful per day which seems to be helping. Denies rectal bleeding or melena. No nausea or vomiting. No NSAIDs currently, previously taking meloxicam daily but PCP stopped this. She has occasional SOB/dizziness, though worse prior to her cardiac episode as above.    Due to extensive cardiac issues, it was felt endoscopic procedures would be too risky, patient was referred to hematology, advised to avoid NSAIDs and continue PPI Daily.  She saw Hematology in march, had further workup for  nutritional deficiencies, hemolysis and bone marrow infiltrative process as well as hemoccult and Feraheme x 2   Occult  stool cards NEGATIVE x3, ferritin 18, iron 63, TIBC 417, sat 15, hgb 13.5, B 12 118, MMA elevated and folate WNL on 3/15, now receiving B12 injections   Present:  Patient reports she is feeling pretty good. She had 2 iron infusions recently and started on B12, she is not having any fatigue, sob or dizziness. Appetite is fair. Her weight is stable. Denies rectal bleeding or melena. Denies abdominal pain, constipation or diarrhea. No nausea or vomiting. No dysphagia or odynophagia.  Last Colonoscopy: >10 years ago  Last Endoscopy: never  Recommendations:    Past Medical History:  Diagnosis Date   Chronic kidney disease    stage 4   Depression    Diabetes mellitus without complication    GERD (gastroesophageal reflux disease)    Hypertension    Neuromuscular disorder    peripheral neuropathy    Past Surgical History:  Procedure Laterality Date   ABDOMINAL HYSTERECTOMY  29   CHOLECYSTECTOMY     age 19   DILATION AND CURETTAGE OF UTERUS     x 4  1980   RIGHT/LEFT HEART CATH AND CORONARY ANGIOGRAPHY N/A 10/30/2022   Procedure: RIGHT/LEFT HEART CATH AND CORONARY ANGIOGRAPHY;  Surgeon: Dolores Patty, MD;  Location: [facility] INVASIVE [facility];  Service: Cardiovascular;  Laterality: N/A;   TONSILLECTOMY     age 89   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   Procedure: TOTAL HIP ARTHROPLASTY ANTERIOR APPROACH;  Surgeon: Jodi Geralds, MD;  Location: [facility] ORS;  Service:  Orthopedics;  Laterality: Right;    Current Outpatient Medications  Medication Sig Dispense Refill   amphetamine-dextroamphetamine (ADDERALL) 20 MG tablet Take 20-40 mg by mouth See admin instructions. Take 40 mg (2 tablets) by mouth in the morning and 20 mg (1 tablet) at 2 pm     aspirin EC 81 MG tablet Take 1 tablet (81 mg total) by mouth daily. Swallow whole. 30 tablet 12   atorvastatin (LIPITOR) 10 MG tablet Take 10 mg by mouth every Wednesday.     busPIRone (BUSPAR) 30 MG tablet Take 30 mg by mouth 3 (three) times daily.      Calcium Carb-Cholecalciferol (CALCIUM 500+D PO) Take 1 tablet by mouth daily.     cholecalciferol (VITAMIN D3) 25 MCG (1000 UNIT) tablet Take 2,000 Units by mouth daily.     cycloSPORINE (RESTASIS) 0.05 % ophthalmic emulsion Place 2 drops into both eyes 2 (two) times daily.     digoxin (LANOXIN) 0.125 MG tablet Take 0.5 tablets (0.0625 mg total) by mouth daily. 15 tablet 0   donepezil (ARICEPT) 5 MG tablet Take 5 mg by mouth at bedtime.     DULoxetine (CYMBALTA) 60 MG capsule Take 120 mg by mouth daily at 12 noon.     folic acid (FOLVITE) 800 MCG tablet Take 800-2,400 mcg by mouth daily.     furosemide (LASIX) 40 MG tablet Take 1 tablet (40 mg total) by mouth daily. 30 tablet 0   Guaifenesin (MUCINEX MAXIMUM STRENGTH) 1200 MG TB12 Take 1,200 mg by mouth daily as needed (severe congestion).     losartan (COZAAR) 25 MG tablet Take 1 tablet (25 mg total) by mouth at bedtime. 30 tablet 8   metFORMIN (GLUCOPHAGE-XR) 500 MG 24 hr tablet Take 1,000 mg by mouth 2 (two) times daily.     metoprolol succinate (TOPROL XL) 25 MG 24 hr tablet Take 1 tablet (25 mg total) by mouth daily. 60 tablet 5   Multiple Minerals-Vitamins (CAL MAG ZINC +D3) TABS Take 1 tablet by mouth daily.     omeprazole (PRILOSEC) 20 MG capsule Take 20 mg by mouth daily.     Probiotic Product (PROBIOTIC PO) Take 1 capsule by mouth daily.     spironolactone (ALDACTONE) 25 MG tablet Take 1 tablet (25 mg total) by mouth daily. 30 tablet 0   triamcinolone cream (KENALOG) 0.1 % Apply 1 application. topically 3 (three) times daily as needed for itching or rash.     No current facility-administered medications for this visit.    Allergies as of 02/04/2023 - Review Complete 02/04/2023  Allergen Reaction Noted   Sulfa antibiotics Shortness Of Breath 10/27/2017   Ace inhibitors Cough 01/29/2022   Codeine Itching and Swelling 10/27/2017   Penicillins  10/27/2017   Statins  10/27/2017   Tramadol  01/29/2022   Zetia [ezetimibe]   01/29/2022   Neosporin [neomycin-bacitracin zn-polymyx] Rash 10/27/2017    Family History  Problem Relation Age of Onset   Cancer Mother    Cancer Father    Heart attack Brother    Polycystic kidney disease Brother    Cancer Other     Social History   Socioeconomic History   Marital status: Single    Spouse name: Not on file   Number of children: Not on file   Years of education: Not on file   Highest education level: Not on file  Occupational History   Not on file  Tobacco Use   Smoking status: Never    Passive exposure: Never  Smokeless tobacco: Never  Vaping Use   Vaping Use: Never used  Substance and Sexual Activity   Alcohol use: No   Drug use: No   Sexual activity: Not on file  Other Topics Concern   Not on file  Social History Narrative   Not on file   Social Determinants of Health   Financial Resource Strain: Not on file  Food Insecurity: No Food Insecurity (12/25/2022)   Hunger Vital Sign    Worried About Running Out of Food in the Last Year: Never true    Ran Out of Food in the Last Year: Never true  Transportation Needs: No Transportation Needs (12/25/2022)   PRAPARE - Administrator, Civil Service (Medical): No    Lack of Transportation (Non-Medical): No  Physical Activity: Not on file  Stress: Not on file  Social Connections: Not on file   Review of systems General: negative for malaise, night sweats, fever, chills, weight loss Neck: Negative for lumps, goiter, pain and significant neck swelling Resp: Negative for cough, wheezing, dyspnea at rest CV: Negative for chest pain, leg swelling, palpitations, orthopnea GI: denies melena, hematochezia, nausea, vomiting, diarrhea, constipation, dysphagia, odyonophagia, early satiety or unintentional weight loss.  MSK: Negative for joint pain or swelling, back pain, and muscle pain. Derm: Negative for itching or rash Psych: Denies depression, anxiety, memory loss, confusion. No homicidal or  suicidal ideation.  Heme: Negative for prolonged bleeding, bruising easily, and swollen nodes. Endocrine: Negative for cold or heat intolerance, polyuria, polydipsia and goiter. Neuro: negative for tremor, gait imbalance, syncope and seizures. The remainder of the review of systems is noncontributory.  Physical Exam: BP 109/66 (BP Location: Left Arm, Patient Position: Sitting, Cuff Size: Normal)   Pulse 78   Temp 97.9 F (36.6 C) (Oral)   Ht 5' 3.5" (1.613 m)   Wt 138 lb (62.6 kg)   BMI 24.06 kg/m  General:   Alert and oriented. No distress noted. Pleasant and cooperative.  Head:  Normocephalic and atraumatic. Eyes:  Conjuctiva clear without scleral icterus. Mouth:  Oral mucosa pink and moist. Good dentition. No lesions. Heart: Normal rate and rhythm, s1 and s2 heart sounds present.  Lungs: Clear lung sounds in all lobes. Respirations equal and unlabored. Abdomen:  +BS, soft, non-tender and non-distended. No rebound or guarding. No HSM or masses noted. Derm: No palmar erythema or jaundice Msk:  Symmetrical without gross deformities. Normal posture. Extremities:  Without edema. Neurologic:  Alert and  oriented x4 Psych:  Alert and cooperative. Normal mood and affect.  Invalid input(s): "6 MONTHS"   ASSESSMENT: Erica Cervantes is a 77 y.o. female presenting today for follow up of IDA.  Seen for IDA previously, though endoscopic procedures were not pursued due to her extensive cardiac issues and low EF. She was referred to hematology where she had FOBT negative x3, low normal iron studies and found to have elevated MMA with B12 deficiency. She was started on B12 injections and has had 2 iron infusions. Hematology is managing her anemia. She has no GI complaints today. She should continue to follow with hematology, we will see her on an as needed basis if any GI issues arise. I discussed this with the patient who verbalized understanding.  Patient also asked me to look at her upcoming  appts as she thought she was supposed to see cardiology again, it appears she was to call them to schedule an appt for next month which she was not aware of, I  have provided her the number to heartcare and advised her to reach out to schedule her follow up with them.    PLAN:  Continue to follow with hematology  2. Schedule your cardiology follow up, number was provided to the patient  3. Pt to make me aware of any new GI issues  All questions were answered, patient verbalized understanding and is in agreement with plan as outlined above.   Follow Up: PRN  Catrena Vari L. Jeanmarie Hubert, MSN, APRN, AGNP-C Adult-Gerontology Nurse Practitioner [facility]  I have reviewed the note and agree with the APP's assessment as described in this progress note  Katrinka Blazing, MD Gastroenterology and Hepatology [facility]

## 2023-02-04 NOTE — Patient Instructions (Signed)
Please continue to follow with hematology regarding your anemia, your appts within [facility] are listed below I will plan to see you on an as needed basis for any GI concerns Please reach out to cardiology to schedule your follow up that they had recommended for next month, their number is [phone]  It appears that cardiology sent Dental clearance on 3/21 for you to have your procedures so you may want to call your dentist and discuss this with them.

## 2023-02-18 ENCOUNTER — Encounter: Payer: Self-pay | Admitting: Hematology

## 2023-02-23 NOTE — Progress Notes (Incomplete)
ADVANCED HF CLINIC PROGRESS NOTE  Primary Care: Doreatha Massed, MD HF Cardiologist: Dr. Gala Romney  HPI: Erica Cervantes is a 77 y.o. female with chronic systolic heart failure, DMII, CKD, HTN, cognitive deficits, GERD, chronic pain, and fibromyalgia.   She was admitted to [facility] 1/24 with SOB, fatigue and chest tightness. Echo showed EF 15% and she was transferred to [facility] for further workup. She was diuresed with IV lasix and underwent R/LHC showing mild CAD, severe volume overload and hemodynamics consistent with CS. Started on IV lasix and milrinone. cMRI showed LVEF 17%, RVEF 23%, moderate MR and otherwise myocardial fibrosis, but no other findings of infiltrative disease. Drips weaned and GDMT titrated, hospitalization complicated by anemia and bilateral pleural effusions. She was discharged home, weight 147 lbs.  She presents back today for f/u. Echo done today. LVEF remains severely reduced 20-25%, RV mildly reduced, mild MR.    She reports she self discontinued [place] ~ 1 wk ago, after she developed several vaginal cyst like lumps. No vaginal discharge or dysuria. Has not seen her PCP yet but plans to arrange f/u. Her wt has increased since stopping Farxiga, up from baseline of 136 lb to 140 lb. Denies resting dyspnea. Notes stable NYHA class II symptoms. No CP.  BP elevated today at 152/83. Reports compliance w/ meds.   We checked cost of Entresto, 90 day supply = $0.   Family History  Some family cardiac history but she doesn't recall exactly what. Sister has a fib and her son has something cardiac related condition for which he's on meds.   Cardiac Studies  - cMRI (1/24): LVEF 17%, RVEF 23%, moderate MR (regurgitant fraction not accurate), no LGE, elevated EVC suggesting myocardial fibrosis but not suggestive of cardiac amyloidosis  - R/LHC (1/24):    Prox Cx to Mid Cx lesion is 30% stenosed.   Ramus lesion is 50% stenosed. RA =  15 PA = 46/28 (35) PCW = 23 CO/CI  (Fick) = 2.9/1.7 CO/CI (thermo) = 2.0/1.1 SVR = 1572 (Fick)  2280 (Thermo) PVR = 4.2 (Fick) 6.1 (TD) PAPi = 1.2   - Echo (1/24) at [facility]: EF 15%, RV normal, mild to moderate central MR, moderate pulmonary HTN  - Echo (5/24, today): EF 20-25%, RV mildly reduced, mild MR   Past Medical History:  Diagnosis Date   Chronic kidney disease    stage 4   Depression    Diabetes mellitus without complication (HCC)    GERD (gastroesophageal reflux disease)    Hypertension    Neuromuscular disorder (HCC)    peripheral neuropathy   Current Outpatient Medications  Medication Sig Dispense Refill   amphetamine-dextroamphetamine (ADDERALL) 20 MG tablet Take 20-40 mg by mouth See admin instructions. Take 40 mg (2 tablets) by mouth in the morning and 20 mg (1 tablet) at 2 pm     aspirin EC 81 MG tablet Take 1 tablet (81 mg total) by mouth daily. Swallow whole. 30 tablet 12   atorvastatin (LIPITOR) 10 MG tablet Take 10 mg by mouth every Wednesday.     busPIRone (BUSPAR) 30 MG tablet Take 30 mg by mouth 3 (three) times daily.     Calcium Carb-Cholecalciferol (CALCIUM 500+D PO) Take 1 tablet by mouth daily.     cholecalciferol (VITAMIN D3) 25 MCG (1000 UNIT) tablet Take 2,000 Units by mouth daily.     cycloSPORINE (RESTASIS) 0.05 % ophthalmic emulsion Place 2 drops into both eyes 2 (two) times daily.     digoxin (LANOXIN)  0.125 MG tablet Take 0.5 tablets (0.0625 mg total) by mouth daily. 15 tablet 0   donepezil (ARICEPT) 5 MG tablet Take 5 mg by mouth at bedtime.     DULoxetine (CYMBALTA) 60 MG capsule Take 120 mg by mouth daily at 12 noon.     folic acid (FOLVITE) 800 MCG tablet Take 800-2,400 mcg by mouth daily.     furosemide (LASIX) 40 MG tablet Take 1 tablet (40 mg total) by mouth daily. 30 tablet 0   Guaifenesin (MUCINEX MAXIMUM STRENGTH) 1200 MG TB12 Take 1,200 mg by mouth daily as needed (severe congestion).     losartan (COZAAR) 25 MG tablet Take 1 tablet (25 mg total) by mouth at bedtime. 30  tablet 8   metFORMIN (GLUCOPHAGE-XR) 500 MG 24 hr tablet Take 1,000 mg by mouth 2 (two) times daily.     metoprolol succinate (TOPROL XL) 25 MG 24 hr tablet Take 1 tablet (25 mg total) by mouth daily. 60 tablet 5   Multiple Minerals-Vitamins (CAL MAG ZINC +D3) TABS Take 1 tablet by mouth daily.     omeprazole (PRILOSEC) 20 MG capsule Take 20 mg by mouth daily.     Probiotic Product (PROBIOTIC PO) Take 1 capsule by mouth daily.     spironolactone (ALDACTONE) 25 MG tablet Take 1 tablet (25 mg total) by mouth daily. 30 tablet 0   triamcinolone cream (KENALOG) 0.1 % Apply 1 application. topically 3 (three) times daily as needed for itching or rash.     No current facility-administered medications for this encounter.   Allergies  Allergen Reactions   Sulfa Antibiotics Shortness Of Breath   Ace Inhibitors Cough    Pt tolerates lisinopril    Codeine Itching and Swelling   Penicillins     Unknown reaction     Statins     Body aches    Tramadol     Numbness    Zetia [Ezetimibe]     Myalgia    Neosporin [Neomycin-Bacitracin Zn-Polymyx] Rash      Social History   Socioeconomic History   Marital status: Single    Spouse name: Not on file   Number of children: Not on file   Years of education: Not on file   Highest education level: Not on file  Occupational History   Not on file  Tobacco Use   Smoking status: Never    Passive exposure: Never   Smokeless tobacco: Never  Vaping Use   Vaping Use: Never used  Substance and Sexual Activity   Alcohol use: No   Drug use: No   Sexual activity: Not on file  Other Topics Concern   Not on file  Social History Narrative   Not on file   Social Determinants of Health   Financial Resource Strain: Not on file  Food Insecurity: No Food Insecurity (12/25/2022)   Hunger Vital Sign    Worried About Running Out of Food in the Last Year: Never true    Ran Out of Food in the Last Year: Never true  Transportation Needs: No Transportation  Needs (12/25/2022)   PRAPARE - Administrator, Civil Service (Medical): No    Lack of Transportation (Non-Medical): No  Physical Activity: Not on file  Stress: Not on file  Social Connections: Not on file  Intimate Partner Violence: Not At Risk (12/25/2022)   Humiliation, Afraid, Rape, and Kick questionnaire    Fear of Current or Ex-Partner: No    Emotionally Abused: No  Physically Abused: No    Sexually Abused: No   Family History  Problem Relation Age of Onset   Cancer Mother    Cancer Father    Heart attack Brother    Polycystic kidney disease Brother    Cancer Other    BP (!) 152/86   Pulse 67   Ht 5' 3.5" (1.613 m)   Wt 63.9 kg (140 lb 12.8 oz)   SpO2 100%   BMI 24.55 kg/m   Wt Readings from Last 3 Encounters:  02/24/23 63.9 kg (140 lb 12.8 oz)  02/04/23 62.6 kg (138 lb)  01/07/23 62.6 kg (137 lb 14.4 oz)   PHYSICAL EXAM: General:  Well appearing, elderly . No respiratory difficulty HEENT: normal Neck: supple. JVD 7-8 cm. Carotids 2+ bilat; no bruits. No lymphadenopathy or thyromegaly appreciated. Cor: PMI nondisplaced. Regular rate & rhythm. No rubs, gallops or murmurs. Lungs: clear Abdomen: soft, nontender, nondistended. No hepatosplenomegaly. No bruits or masses. Good bowel sounds. Extremities: no cyanosis, clubbing, rash, edema Neuro: alert & oriented x 3, cranial nerves grossly intact. moves all 4 extremities w/o difficulty. Affect pleasant. .  ASSESSMENT & PLAN: 1. Chronic Systolic HF  - New diagnosis. - Echo at [facility] (1/24):  EF 15%, LV mod-sev dilated, LA mildly dilated, RV function normal, mod pulmonary hypertension, mod MR - R/LHC (1/24): mild CAD, biventricular failure and cardiogenic shock - cMRI (1/24): LVEF 17% RV EF 23%. Extracellular volume 36%. + myocardial fibrosis but no other findings of infiltrative disease  - Echo completed today. EF 20-25%, RV mildly reduced, mild MR - Etiology of CM unclear. Not candidate for  advanced therapies with age and comorbidities - Will continue w/ medical management and GDMT optimization  - NYHA II. Mildly volume up after Marcelline Deist was discontinued. BP elevated - Stop Losartan  - Start Entresto 24-26 mg bid (90 day supply = $0 copay) - Start Lasix 20 mg daily  - Now off Farxiga given GU symptoms  - Continue spiro 25 mg daily - Continue Toprol XL 25 mg daily. - Continue digoxin 0.0625 mg daily. Check Dig level today  - Check BMP and BNP today and again in 7 days    CAD - mild, nonobstructive by cath (1/24) - denies CP  - Continue ASA/statin  3. DM II - Hgb A1c 7.7 - Now off SGLT2i given GU symptoms - remains on Metformin  - advised to f/u w/ PCP    4.Chronic anemia - Iron and B12 deficient  - She is followed by GI (FOBT -) and Heme, [facility]  5. GU Symptoms - self reports development of several vaginal cyst like lumps. No vaginal discharge or dysuria. - agree w/ holding Marcelline Deist for now - advised that she arrange an appt w/ her PCP for further evaluation/treatment   6. Hypertension  - moderately elevated - GDMT titration per above, adding Entresto    F/u BMP and BNP in 7 days. F/u w/ APP in 3 wks.   Robbie Lis, PA-C 02/24/2023

## 2023-02-24 ENCOUNTER — Ambulatory Visit (HOSPITAL_BASED_OUTPATIENT_CLINIC_OR_DEPARTMENT_OTHER)
Admission: RE | Admit: 2023-02-24 | Discharge: 2023-02-24 | Disposition: A | Discharge status: Home / Self Care | Payer: PPO | Source: Ambulatory Visit

## 2023-02-24 ENCOUNTER — Ambulatory Visit (HOSPITAL_COMMUNITY)
Admission: RE | Admit: 2023-02-24 | Discharge: 2023-02-24 | Disposition: A | Discharge status: Home / Self Care | Payer: PPO | Source: Ambulatory Visit | Attending: Hematology | Admitting: Hematology

## 2023-02-24 ENCOUNTER — Other Ambulatory Visit (HOSPITAL_COMMUNITY): Payer: Self-pay

## 2023-02-24 ENCOUNTER — Encounter (HOSPITAL_COMMUNITY): Payer: Self-pay

## 2023-02-24 VITALS — BP 152/86 | HR 67 | Ht 63.5 in | Wt 140.8 lb

## 2023-02-24 DIAGNOSIS — I5022 Chronic systolic (congestive) heart failure: Secondary | ICD-10-CM

## 2023-02-24 DIAGNOSIS — E1122 Type 2 diabetes mellitus with diabetic chronic kidney disease: Secondary | ICD-10-CM | POA: Diagnosis not present

## 2023-02-24 DIAGNOSIS — E538 Deficiency of other specified B group vitamins: Secondary | ICD-10-CM | POA: Insufficient documentation

## 2023-02-24 DIAGNOSIS — K219 Gastro-esophageal reflux disease without esophagitis: Secondary | ICD-10-CM | POA: Diagnosis not present

## 2023-02-24 DIAGNOSIS — Z79899 Other long term (current) drug therapy: Secondary | ICD-10-CM | POA: Insufficient documentation

## 2023-02-24 DIAGNOSIS — Z7982 Long term (current) use of aspirin: Secondary | ICD-10-CM | POA: Diagnosis not present

## 2023-02-24 DIAGNOSIS — E1142 Type 2 diabetes mellitus with diabetic polyneuropathy: Secondary | ICD-10-CM | POA: Insufficient documentation

## 2023-02-24 DIAGNOSIS — I5042 Chronic combined systolic (congestive) and diastolic (congestive) heart failure: Secondary | ICD-10-CM | POA: Diagnosis not present

## 2023-02-24 DIAGNOSIS — Z7984 Long term (current) use of oral hypoglycemic drugs: Secondary | ICD-10-CM | POA: Insufficient documentation

## 2023-02-24 DIAGNOSIS — J90 Pleural effusion, not elsewhere classified: Secondary | ICD-10-CM | POA: Diagnosis not present

## 2023-02-24 DIAGNOSIS — I10 Essential (primary) hypertension: Secondary | ICD-10-CM | POA: Diagnosis not present

## 2023-02-24 DIAGNOSIS — I340 Nonrheumatic mitral (valve) insufficiency: Secondary | ICD-10-CM | POA: Diagnosis not present

## 2023-02-24 DIAGNOSIS — I2510 Atherosclerotic heart disease of native coronary artery without angina pectoris: Secondary | ICD-10-CM | POA: Diagnosis not present

## 2023-02-24 DIAGNOSIS — I5082 Biventricular heart failure: Secondary | ICD-10-CM | POA: Insufficient documentation

## 2023-02-24 DIAGNOSIS — N189 Chronic kidney disease, unspecified: Secondary | ICD-10-CM | POA: Insufficient documentation

## 2023-02-24 DIAGNOSIS — D631 Anemia in chronic kidney disease: Secondary | ICD-10-CM | POA: Insufficient documentation

## 2023-02-24 DIAGNOSIS — I130 Hypertensive heart and chronic kidney disease with heart failure and stage 1 through stage 4 chronic kidney disease, or unspecified chronic kidney disease: Secondary | ICD-10-CM | POA: Insufficient documentation

## 2023-02-24 DIAGNOSIS — I272 Pulmonary hypertension, unspecified: Secondary | ICD-10-CM | POA: Insufficient documentation

## 2023-02-24 LAB — ECHOCARDIOGRAM COMPLETE
Area-P 1/2: 3.75 cm2
Calc EF: 26.5 %
MV M vel: 4.95 m/s
MV Peak grad: 98 mmHg
MV VTI: 2.93 cm2
Radius: 0.5 cm
S' Lateral: 5 cm
Single Plane A2C EF: 30.3 %
Single Plane A4C EF: 21.7 %

## 2023-02-24 LAB — BASIC METABOLIC PANEL
Anion gap: 8 (ref 5–15)
BUN: 19 mg/dL (ref 8–23)
CO2: 28 mmol/L (ref 22–32)
Calcium: 9.7 mg/dL (ref 8.9–10.3)
Chloride: 99 mmol/L (ref 98–111)
Creatinine, Ser: 1.11 mg/dL — ABNORMAL HIGH (ref 0.44–1.00)
GFR, Estimated: 51 mL/min — ABNORMAL LOW (ref >60)
Glucose, Bld: 159 mg/dL — ABNORMAL HIGH (ref 70–99)
Potassium: 4.2 mmol/L (ref 3.5–5.1)
Sodium: 135 mmol/L (ref 135–145)

## 2023-02-24 LAB — DIGOXIN LEVEL: Digoxin Level: 0.9 ng/mL (ref 0.8–2.0)

## 2023-02-24 LAB — BRAIN NATRIURETIC PEPTIDE: B Natriuretic Peptide: 176.8 pg/mL — ABNORMAL HIGH (ref 0.0–100.0)

## 2023-02-24 MED ORDER — FUROSEMIDE 20 MG PO TABS: 20.0000 mg | ORAL_TABLET | Freq: Every day | ORAL | 0 refills | Status: DC

## 2023-02-24 MED ORDER — ENTRESTO 24-26 MG PO TABS: 1.0000 | ORAL_TABLET | Freq: Two times a day (BID) | ORAL | 7 refills | Status: DC

## 2023-02-24 NOTE — Patient Instructions (Addendum)
Medication Changes:  STOP Losartan   START taking Entresto 24-26 mg (1 tablet) , twice a day.   START taking lasix 20 mg (1 tablet) Daily.    *If you need a refill on your cardiac medications before your next appointment, please call your pharmacy*  Lab Work:  Labs done today, your results will be available in MyChart, we will contact you for abnormal readings.  Labs in one week!  Follow-Up in:   Your physician recommends that you schedule a follow-up appointment in: 3 weeks   Do the following things EVERYDAY: Weigh yourself in the morning before breakfast. Write it down and keep it in a log. Take your medicines as prescribed Eat low salt foods--Limit salt (sodium) to 2000 mg per day.  Stay as active as you can everyday Limit all fluids for the day to less than 2 liters    Need to Contact us:  If you have any questions or concerns before your next appointment please send [place] a message through [employer] or call our office at [phone].    TO LEAVE A MESSAGE FOR THE NURSE SELECT OPTION 2, PLEASE LEAVE A MESSAGE INCLUDING: YOUR NAME DATE OF BIRTH CALL BACK NUMBER REASON FOR CALL**this is important as we prioritize the call backs  YOU WILL RECEIVE A CALL BACK THE SAME DAY AS LONG AS YOU CALL BEFORE 4:00 PM   At the [facility], you and your health needs are our priority. As part of our continuing mission to provide you with exceptional heart care, we have created designated Provider Care Teams. These Care Teams include your primary Cardiologist (physician) and Advanced Practice Providers (APPs- Physician Assistants and Nurse Practitioners) who all work together to provide you with the care you need, when you need it.   You may see any of the following providers on your designated Care Team at your next follow up: Dr Arvilla Meres Dr Marca Ancona Dr. Marcos Eke, NP Robbie Lis, [place] [facility] [place], [place] Brynda Peon, NP Karle Plumber, PharmD   Please be sure to bring in all your medications bottles to every appointment.    Thank you for choosing [facility]

## 2023-02-24 NOTE — Progress Notes (Signed)
  2D Echocardiogram has been performed.  Erica Cervantes 02/24/2023, 11:00 AM

## 2023-02-24 NOTE — Addendum Note (Signed)
Encounter addended by: Christiana Fuchs, RN on: 02/24/2023 12:10 PM  Actions taken: Clinical Note Signed

## 2023-02-25 ENCOUNTER — Encounter (HOSPITAL_COMMUNITY): Payer: Self-pay

## 2023-03-01 ENCOUNTER — Inpatient Hospital Stay: Payer: PPO | Attending: Hematology

## 2023-03-01 VITALS — BP 118/66 | HR 74 | Temp 96.7°F | Resp 18

## 2023-03-01 DIAGNOSIS — E538 Deficiency of other specified B group vitamins: Secondary | ICD-10-CM | POA: Insufficient documentation

## 2023-03-01 DIAGNOSIS — D509 Iron deficiency anemia, unspecified: Secondary | ICD-10-CM

## 2023-03-01 MED ORDER — CYANOCOBALAMIN 1000 MCG/ML IJ SOLN
1000.0000 ug | Freq: Once | INTRAMUSCULAR | Status: AC
  Administered 2023-03-01: 1000 ug via INTRAMUSCULAR
  Filled 2023-03-01: qty 1

## 2023-03-01 NOTE — Patient Instructions (Signed)
[facility] [place]  Discharge Instructions: Thank you for choosing [facility] to provide your oncology and hematology care.  If you have a lab appointment with the Cancer Center - please note that after April 8th, 2024, all labs will be drawn in the cancer center.  You do not have to check in or register with the main entrance as you have in the past but will complete your check-in in the cancer center.  Wear comfortable clothing and clothing appropriate for easy access to any Portacath or PICC line.   We strive to give you quality time with your provider. You may need to reschedule your appointment if you arrive late (15 or more minutes).  Arriving late affects you and other patients whose appointments are after yours.  Also, if you miss three or more appointments without notifying the office, you may be dismissed from the clinic at the provider's discretion.      For prescription refill requests, have your pharmacy contact our office and allow 72 hours for refills to be completed.    Today you received the following B12 injection, return as scheduled.   To help prevent nausea and vomiting after your treatment, we encourage you to take your nausea medication as directed.  BELOW ARE SYMPTOMS THAT SHOULD BE REPORTED IMMEDIATELY: *FEVER GREATER THAN 100.4 F (38 C) OR HIGHER *CHILLS OR SWEATING *NAUSEA AND VOMITING THAT IS NOT CONTROLLED WITH YOUR NAUSEA MEDICATION *UNUSUAL SHORTNESS OF BREATH *UNUSUAL BRUISING OR BLEEDING *URINARY PROBLEMS (pain or burning when urinating, or frequent urination) *BOWEL PROBLEMS (unusual diarrhea, constipation, pain near the anus) TENDERNESS IN MOUTH AND THROAT WITH OR WITHOUT PRESENCE OF ULCERS (sore throat, sores in mouth, or a toothache) UNUSUAL RASH, SWELLING OR PAIN  UNUSUAL VAGINAL DISCHARGE OR ITCHING   Items with * indicate a potential emergency and should be followed up as soon as possible or go to the Emergency  Department if any problems should occur.  Please show the CHEMOTHERAPY ALERT CARD or IMMUNOTHERAPY ALERT CARD at check-in to the Emergency Department and triage nurse.  Should you have questions after your visit or need to cancel or reschedule your appointment, please contact [facility] [place] [facility] [phone]  and follow the prompts.  Office hours are 8:00 a.m. to 4:30 p.m. Monday - Friday. Please note that voicemails left after 4:00 p.m. may not be returned until the following business day.  We are closed weekends and major holidays. You have access to a nurse at all times for urgent questions. Please call the main number to the clinic [phone] and follow the prompts.  For any non-urgent questions, you may also contact your provider using MyChart. We now offer e-Visits for anyone 18 and older to request care online for non-urgent symptoms. For details visit [email or web address].   Also download the MyChart app! Go to the app store, search "MyChart", open the app, select [place], and log in with your MyChart username and password.   

## 2023-03-01 NOTE — Progress Notes (Signed)
Patient tolerated injection with no complaints voiced. Site clean and dry with no bruising or swelling noted at site. See MAR for details. Band aid applied.  Patient stable during and after injection. VSS with discharge and left in satisfactory condition with no s/s of distress noted.  

## 2023-03-12 DIAGNOSIS — E782 Mixed hyperlipidemia: Secondary | ICD-10-CM | POA: Diagnosis not present

## 2023-03-12 DIAGNOSIS — I5020 Unspecified systolic (congestive) heart failure: Secondary | ICD-10-CM | POA: Diagnosis not present

## 2023-03-12 DIAGNOSIS — E1122 Type 2 diabetes mellitus with diabetic chronic kidney disease: Secondary | ICD-10-CM | POA: Diagnosis not present

## 2023-03-15 ENCOUNTER — Other Ambulatory Visit (HOSPITAL_COMMUNITY): Payer: Self-pay

## 2023-03-15 MED ORDER — FUROSEMIDE 20 MG PO TABS: 20.0000 mg | ORAL_TABLET | Freq: Every day | ORAL | 11 refills | Status: DC

## 2023-03-15 NOTE — Progress Notes (Signed)
ADVANCED HF CLINIC PROGRESS NOTE  Primary Care: Doreatha Massed, MD HF Cardiologist: Dr. Gala Romney  HPI: [name] is a 77 y.o. female with chronic systolic heart failure, DMII, CKD, HTN, cognitive deficits, GERD, chronic pain, and fibromyalgia.   She was admitted to [facility] 1/24 with SOB, fatigue and chest tightness. Echo showed EF 15% and she was transferred to [facility] for further workup. She was diuresed with IV lasix and underwent R/LHC showing mild CAD, severe volume overload and hemodynamics consistent with CS. Started on IV lasix and milrinone. cMRI showed LVEF 17%, RVEF 23%, moderate MR and otherwise myocardial fibrosis, but no other findings of infiltrative disease. Drips weaned and GDMT titrated, hospitalization complicated by anemia and bilateral pleural effusions. She was discharged home, weight 147 lbs.  She presents back today for f/u. Echo done today. LVEF remains severely reduced 20-25%, RV mildly reduced, mild MR.    She reports she self discontinued [place] ~ 1 wk ago, after she developed several vaginal cyst like lumps. No vaginal discharge or dysuria. Has not seen her PCP yet but plans to arrange f/u. Her wt has increased since stopping Farxiga, up from baseline of 136 lb to 140 lb. Denies resting dyspnea. Notes stable NYHA class II symptoms. No CP.  BP elevated today at 152/83. Reports compliance w/ meds.   We checked cost of Entresto, 90 day supply = $0.   Family History  Some family cardiac history but she doesn't recall exactly what. Sister has a fib and her son has something cardiac related condition for which he's on meds.   Cardiac Studies  - cMRI (1/24): LVEF 17%, RVEF 23%, moderate MR (regurgitant fraction not accurate), no LGE, elevated EVC suggesting myocardial fibrosis but not suggestive of cardiac amyloidosis  - R/LHC (1/24):    Prox Cx to Mid Cx lesion is 30% stenosed.   Ramus lesion is 50% stenosed. RA =  15 PA = 46/28 (35) PCW = 23 CO/CI  (Fick) = 2.9/1.7 CO/CI (thermo) = 2.0/1.1 SVR = 1572 (Fick)  2280 (Thermo) PVR = 4.2 (Fick) 6.1 (TD) PAPi = 1.2   - Echo (1/24) at [facility]: EF 15%, RV normal, mild to moderate central MR, moderate pulmonary HTN  - Echo (5/24, today): EF 20-25%, RV mildly reduced, mild MR   Past Medical History:  Diagnosis Date   Chronic kidney disease    stage 4   Depression    Diabetes mellitus without complication (HCC)    GERD (gastroesophageal reflux disease)    Hypertension    Neuromuscular disorder (HCC)    peripheral neuropathy   Current Outpatient Medications  Medication Sig Dispense Refill   amphetamine-dextroamphetamine (ADDERALL) 20 MG tablet Take 20-40 mg by mouth See admin instructions. Take 40 mg (2 tablets) by mouth in the morning and 20 mg (1 tablet) at 2 pm     aspirin EC 81 MG tablet Take 1 tablet (81 mg total) by mouth daily. Swallow whole. 30 tablet 12   atorvastatin (LIPITOR) 10 MG tablet Take 10 mg by mouth every Wednesday.     busPIRone (BUSPAR) 30 MG tablet Take 30 mg by mouth 3 (three) times daily.     Calcium Carb-Cholecalciferol (CALCIUM 500+D PO) Take 1 tablet by mouth daily.     cholecalciferol (VITAMIN D3) 25 MCG (1000 UNIT) tablet Take 2,000 Units by mouth daily.     cycloSPORINE (RESTASIS) 0.05 % ophthalmic emulsion Place 2 drops into both eyes 2 (two) times daily.     digoxin (LANOXIN)  0.125 MG tablet Take 0.5 tablets (0.0625 mg total) by mouth daily. 15 tablet 0   donepezil (ARICEPT) 5 MG tablet Take 5 mg by mouth at bedtime.     DULoxetine (CYMBALTA) 60 MG capsule Take 120 mg by mouth daily at 12 noon.     folic acid (FOLVITE) 800 MCG tablet Take 800-2,400 mcg by mouth daily.     furosemide (LASIX) 20 MG tablet Take 1 tablet (20 mg total) by mouth daily. 30 tablet 0   Guaifenesin (MUCINEX MAXIMUM STRENGTH) 1200 MG TB12 Take 1,200 mg by mouth daily as needed (severe congestion).     metFORMIN (GLUCOPHAGE-XR) 500 MG 24 hr tablet Take 1,000 mg by mouth 2 (two) times  daily.     metoprolol succinate (TOPROL XL) 25 MG 24 hr tablet Take 1 tablet (25 mg total) by mouth daily. 60 tablet 5   Multiple Minerals-Vitamins (CAL MAG ZINC +D3) TABS Take 1 tablet by mouth daily.     omeprazole (PRILOSEC) 20 MG capsule Take 20 mg by mouth daily.     Probiotic Product (PROBIOTIC PO) Take 1 capsule by mouth daily.     sacubitril-valsartan (ENTRESTO) 24-26 MG Take 1 tablet by mouth 2 (two) times daily. 90 tablet 7   spironolactone (ALDACTONE) 25 MG tablet Take 1 tablet (25 mg total) by mouth daily. 30 tablet 0   triamcinolone cream (KENALOG) 0.1 % Apply 1 application. topically 3 (three) times daily as needed for itching or rash.     No current facility-administered medications for this visit.   Allergies  Allergen Reactions   Sulfa Antibiotics Shortness Of Breath   Ace Inhibitors Cough    Pt tolerates lisinopril    Codeine Itching and Swelling   Penicillins     Unknown reaction     Statins     Body aches    Tramadol     Numbness    Zetia [Ezetimibe]     Myalgia    Neosporin [Neomycin-Bacitracin Zn-Polymyx] Rash      Social History   Socioeconomic History   Marital status: Single    Spouse name: Not on file   Number of children: Not on file   Years of education: Not on file   Highest education level: Not on file  Occupational History   Not on file  Tobacco Use   Smoking status: Never    Passive exposure: Never   Smokeless tobacco: Never  Vaping Use   Vaping Use: Never used  Substance and Sexual Activity   Alcohol use: No   Drug use: No   Sexual activity: Not on file  Other Topics Concern   Not on file  Social History Narrative   Not on file   Social Determinants of Health   Financial Resource Strain: Not on file  Food Insecurity: No Food Insecurity (12/25/2022)   Hunger Vital Sign    Worried About Running Out of Food in the Last Year: Never true    Ran Out of Food in the Last Year: Never true  Transportation Needs: No Transportation  Needs (12/25/2022)   PRAPARE - Administrator, Civil Service (Medical): No    Lack of Transportation (Non-Medical): No  Physical Activity: Not on file  Stress: Not on file  Social Connections: Not on file  Intimate Partner Violence: Not At Risk (12/25/2022)   Humiliation, Afraid, Rape, and Kick questionnaire    Fear of Current or Ex-Partner: No    Emotionally Abused: No    Physically  Abused: No    Sexually Abused: No   Family History  Problem Relation Age of Onset   Cancer Mother    Cancer Father    Heart attack Brother    Polycystic kidney disease Brother    Cancer Other    There were no vitals taken for this visit.  Wt Readings from Last 3 Encounters:  02/24/23 63.9 kg (140 lb 12.8 oz)  02/04/23 62.6 kg (138 lb)  01/07/23 62.6 kg (137 lb 14.4 oz)   PHYSICAL EXAM: General:  Well appearing, elderly . No respiratory difficulty HEENT: normal Neck: supple. JVD 7-8 cm. Carotids 2+ bilat; no bruits. No lymphadenopathy or thyromegaly appreciated. Cor: PMI nondisplaced. Regular rate & rhythm. No rubs, gallops or murmurs. Lungs: clear Abdomen: soft, nontender, nondistended. No hepatosplenomegaly. No bruits or masses. Good bowel sounds. Extremities: no cyanosis, clubbing, rash, edema Neuro: alert & oriented x 3, cranial nerves grossly intact. moves all 4 extremities w/o difficulty. Affect pleasant. .  ASSESSMENT & PLAN: 1. Chronic Systolic HF  - New diagnosis. - Echo at [facility] (1/24):  EF 15%, LV mod-sev dilated, LA mildly dilated, RV function normal, mod pulmonary hypertension, mod MR - R/LHC (1/24): mild CAD, biventricular failure and cardiogenic shock - cMRI (1/24): LVEF 17% RV EF 23%. Extracellular volume 36%. + myocardial fibrosis but no other findings of infiltrative disease  - Echo completed today. EF 20-25%, RV mildly reduced, mild MR - Etiology of CM unclear. Not candidate for advanced therapies with age and comorbidities - Will continue w/ medical  management and GDMT optimization  - NYHA II. Mildly volume up after Marcelline Deist was discontinued. BP elevated - Stop Losartan  - Start Entresto 24-26 mg bid (90 day supply = $0 copay) - Start Lasix 20 mg daily  - Now off Farxiga given GU symptoms  - Continue spiro 25 mg daily - Continue Toprol XL 25 mg daily. - Continue digoxin 0.0625 mg daily. Check Dig level today  - Check BMP and BNP today and again in 7 days    CAD - mild, nonobstructive by cath (1/24) - denies CP  - Continue ASA/statin  3. DM II - Hgb A1c 7.7 - Now off SGLT2i given GU symptoms - remains on Metformin  - advised to f/u w/ PCP    4.Chronic anemia - Iron and B12 deficient  - She is followed by GI ([facility] -) and Heme, gets scheduled IV Fe and B12 infusions/injections at the Cancer Center  5. GU Symptoms - self reports development of several vaginal cyst like lumps. No vaginal discharge or dysuria. - agree w/ holding Marcelline Deist for now - advised that she arrange an appt w/ her PCP for further evaluation/treatment   6. Hypertension  - moderately elevated - GDMT titration per above, adding Entresto    F/u BMP and BNP in 7 days. F/u w/ APP in 3 wks.   Anderson Malta [place], [place] 03/15/2023

## 2023-03-16 ENCOUNTER — Ambulatory Visit (INDEPENDENT_AMBULATORY_CARE_PROVIDER_SITE_OTHER): Payer: PPO | Admitting: Gastroenterology

## 2023-03-17 ENCOUNTER — Encounter (HOSPITAL_COMMUNITY): Payer: Self-pay

## 2023-03-17 ENCOUNTER — Ambulatory Visit (HOSPITAL_COMMUNITY)
Admission: RE | Admit: 2023-03-17 | Discharge: 2023-03-17 | Disposition: A | Discharge status: Home / Self Care | Payer: PPO | Source: Ambulatory Visit | Attending: Family Medicine | Admitting: Family Medicine

## 2023-03-17 VITALS — BP 154/86 | HR 79 | Wt 137.8 lb

## 2023-03-17 DIAGNOSIS — R4189 Other symptoms and signs involving cognitive functions and awareness: Secondary | ICD-10-CM | POA: Insufficient documentation

## 2023-03-17 DIAGNOSIS — D649 Anemia, unspecified: Secondary | ICD-10-CM | POA: Diagnosis not present

## 2023-03-17 DIAGNOSIS — E1169 Type 2 diabetes mellitus with other specified complication: Secondary | ICD-10-CM

## 2023-03-17 DIAGNOSIS — G8929 Other chronic pain: Secondary | ICD-10-CM | POA: Insufficient documentation

## 2023-03-17 DIAGNOSIS — E1122 Type 2 diabetes mellitus with diabetic chronic kidney disease: Secondary | ICD-10-CM | POA: Insufficient documentation

## 2023-03-17 DIAGNOSIS — E538 Deficiency of other specified B group vitamins: Secondary | ICD-10-CM | POA: Diagnosis not present

## 2023-03-17 DIAGNOSIS — I5022 Chronic systolic (congestive) heart failure: Secondary | ICD-10-CM

## 2023-03-17 DIAGNOSIS — N189 Chronic kidney disease, unspecified: Secondary | ICD-10-CM | POA: Insufficient documentation

## 2023-03-17 DIAGNOSIS — I130 Hypertensive heart and chronic kidney disease with heart failure and stage 1 through stage 4 chronic kidney disease, or unspecified chronic kidney disease: Secondary | ICD-10-CM | POA: Insufficient documentation

## 2023-03-17 DIAGNOSIS — E611 Iron deficiency: Secondary | ICD-10-CM | POA: Insufficient documentation

## 2023-03-17 DIAGNOSIS — M797 Fibromyalgia: Secondary | ICD-10-CM | POA: Diagnosis not present

## 2023-03-17 DIAGNOSIS — Z79899 Other long term (current) drug therapy: Secondary | ICD-10-CM | POA: Diagnosis not present

## 2023-03-17 DIAGNOSIS — I272 Pulmonary hypertension, unspecified: Secondary | ICD-10-CM | POA: Diagnosis not present

## 2023-03-17 DIAGNOSIS — K219 Gastro-esophageal reflux disease without esophagitis: Secondary | ICD-10-CM | POA: Insufficient documentation

## 2023-03-17 DIAGNOSIS — I10 Essential (primary) hypertension: Secondary | ICD-10-CM | POA: Diagnosis not present

## 2023-03-17 DIAGNOSIS — E1142 Type 2 diabetes mellitus with diabetic polyneuropathy: Secondary | ICD-10-CM | POA: Insufficient documentation

## 2023-03-17 DIAGNOSIS — D509 Iron deficiency anemia, unspecified: Secondary | ICD-10-CM

## 2023-03-17 DIAGNOSIS — I2510 Atherosclerotic heart disease of native coronary artery without angina pectoris: Secondary | ICD-10-CM

## 2023-03-17 DIAGNOSIS — I5082 Biventricular heart failure: Secondary | ICD-10-CM | POA: Insufficient documentation

## 2023-03-17 MED ORDER — FUROSEMIDE 20 MG PO TABS: 20.0000 mg | ORAL_TABLET | Freq: Every day | ORAL | 4 refills | Status: DC

## 2023-03-17 MED ORDER — ENTRESTO 49-51 MG PO TABS: 1.0000 | ORAL_TABLET | Freq: Two times a day (BID) | ORAL | 7 refills | Status: DC

## 2023-03-17 NOTE — Progress Notes (Signed)
ReDS Vest / Clip - 03/17/23 1100       ReDS Vest / Clip   Station Marker B    Ruler Value 22    ReDS Value Range Low volume    ReDS Actual Value 34

## 2023-03-17 NOTE — Patient Instructions (Addendum)
Medication Changes:  START taking Entresto 49/51 mg (1 tablet) twice a day.   *If you need a refill on your cardiac medications before your next appointment, please call your pharmacy*  You have a lab appointment the 25th. PLEASE DO NOT TAKE DIGOXIN THE MORNING OF THE APPOINTMENT.    Follow-Up in:   Your physician recommends that you schedule a follow-up appointment in: 3 MONTHS with Dr. Gala Romney.  ** PLEASE call back in August to schedule an appointment**   Do the following things EVERYDAY: Weigh yourself in the morning before breakfast. Write it down and keep it in a log. Take your medicines as prescribed Eat low salt foods--Limit salt (sodium) to 2000 mg per day.  Stay as active as you can everyday Limit all fluids for the day to less than 2 liters    Need to Contact us:  If you have any questions or concerns before your next appointment please send [place] a message through [place] or call our office at [phone].    TO LEAVE A MESSAGE FOR THE NURSE SELECT OPTION 2, PLEASE LEAVE A MESSAGE INCLUDING: YOUR NAME DATE OF BIRTH CALL BACK NUMBER REASON FOR CALL**this is important as we prioritize the call backs  YOU WILL RECEIVE A CALL BACK THE SAME DAY AS LONG AS YOU CALL BEFORE 4:00 PM   At the [facility], you and your health needs are our priority. As part of our continuing mission to provide you with exceptional heart care, we have created designated Provider Care Teams. These Care Teams include your primary Cardiologist (physician) and Advanced Practice Providers (APPs- Physician Assistants and Nurse Practitioners) who all work together to provide you with the care you need, when you need it.   You may see any of the following providers on your designated Care Team at your next follow up: Dr Arvilla Meres Dr Marca Ancona Dr. Marcos Eke, NP Robbie Lis, [place] [facility] [place], [place] Brynda Peon, NP Karle Plumber,  PharmD   Please be sure to bring in all your medications bottles to every appointment.    Thank you for choosing [facility]

## 2023-03-31 DIAGNOSIS — E1165 Type 2 diabetes mellitus with hyperglycemia: Secondary | ICD-10-CM | POA: Diagnosis not present

## 2023-03-31 DIAGNOSIS — E7849 Other hyperlipidemia: Secondary | ICD-10-CM | POA: Diagnosis not present

## 2023-03-31 DIAGNOSIS — I10 Essential (primary) hypertension: Secondary | ICD-10-CM | POA: Diagnosis not present

## 2023-03-31 DIAGNOSIS — E1122 Type 2 diabetes mellitus with diabetic chronic kidney disease: Secondary | ICD-10-CM | POA: Diagnosis not present

## 2023-03-31 DIAGNOSIS — Z1329 Encounter for screening for other suspected endocrine disorder: Secondary | ICD-10-CM | POA: Diagnosis not present

## 2023-03-31 DIAGNOSIS — N183 Chronic kidney disease, stage 3 unspecified: Secondary | ICD-10-CM | POA: Diagnosis not present

## 2023-04-05 ENCOUNTER — Other Ambulatory Visit: Payer: Self-pay

## 2023-04-05 DIAGNOSIS — D509 Iron deficiency anemia, unspecified: Secondary | ICD-10-CM

## 2023-04-06 ENCOUNTER — Inpatient Hospital Stay: Payer: PPO | Attending: Hematology

## 2023-04-06 ENCOUNTER — Inpatient Hospital Stay (HOSPITAL_BASED_OUTPATIENT_CLINIC_OR_DEPARTMENT_OTHER): Payer: PPO | Admitting: Oncology

## 2023-04-06 ENCOUNTER — Inpatient Hospital Stay: Payer: PPO

## 2023-04-06 ENCOUNTER — Ambulatory Visit (HOSPITAL_COMMUNITY)
Admission: RE | Admit: 2023-04-06 | Discharge: 2023-04-06 | Disposition: A | Discharge status: Home / Self Care | Payer: PPO | Source: Ambulatory Visit | Attending: Oncology | Admitting: Oncology

## 2023-04-06 VITALS — BP 139/79 | HR 65 | Temp 97.2°F | Resp 16 | Wt 135.1 lb

## 2023-04-06 DIAGNOSIS — E538 Deficiency of other specified B group vitamins: Secondary | ICD-10-CM | POA: Diagnosis not present

## 2023-04-06 DIAGNOSIS — E876 Hypokalemia: Secondary | ICD-10-CM

## 2023-04-06 DIAGNOSIS — R0781 Pleurodynia: Secondary | ICD-10-CM | POA: Insufficient documentation

## 2023-04-06 DIAGNOSIS — R768 Other specified abnormal immunological findings in serum: Secondary | ICD-10-CM

## 2023-04-06 DIAGNOSIS — D509 Iron deficiency anemia, unspecified: Secondary | ICD-10-CM | POA: Diagnosis not present

## 2023-04-06 DIAGNOSIS — I5022 Chronic systolic (congestive) heart failure: Secondary | ICD-10-CM | POA: Diagnosis not present

## 2023-04-06 LAB — IRON AND TIBC
Iron: 63 ug/dL (ref 28–170)
Saturation Ratios: 25 % (ref 10.4–31.8)
TIBC: 248 ug/dL — ABNORMAL LOW (ref 250–450)
UIBC: 185 ug/dL

## 2023-04-06 LAB — CBC WITH DIFFERENTIAL/PLATELET
Abs Immature Granulocytes: 0.04 10*3/uL (ref 0.00–0.07)
Basophils Absolute: 0.1 10*3/uL (ref 0.0–0.1)
Basophils Relative: 1 %
Eosinophils Absolute: 0.4 10*3/uL (ref 0.0–0.5)
Eosinophils Relative: 5 %
HCT: 42.4 % (ref 36.0–46.0)
Hemoglobin: 13.5 g/dL (ref 12.0–15.0)
Immature Granulocytes: 1 %
Lymphocytes Relative: 26 %
Lymphs Abs: 2.2 10*3/uL (ref 0.7–4.0)
MCH: 30.3 pg (ref 26.0–34.0)
MCHC: 31.8 g/dL (ref 30.0–36.0)
MCV: 95.3 fL (ref 80.0–100.0)
Monocytes Absolute: 0.8 10*3/uL (ref 0.1–1.0)
Monocytes Relative: 9 %
Neutro Abs: 4.9 10*3/uL (ref 1.7–7.7)
Neutrophils Relative %: 58 %
Platelets: 277 10*3/uL (ref 150–400)
RBC: 4.45 MIL/uL (ref 3.87–5.11)
RDW: 15.2 % (ref 11.5–15.5)
WBC: 8.3 10*3/uL (ref 4.0–10.5)
nRBC: 0 % (ref 0.0–0.2)

## 2023-04-06 LAB — BASIC METABOLIC PANEL
Anion gap: 11 (ref 5–15)
BUN: 19 mg/dL (ref 8–23)
CO2: 27 mmol/L (ref 22–32)
Calcium: 9.5 mg/dL (ref 8.9–10.3)
Chloride: 100 mmol/L (ref 98–111)
Creatinine, Ser: 1.05 mg/dL — ABNORMAL HIGH (ref 0.44–1.00)
GFR, Estimated: 54 mL/min — ABNORMAL LOW (ref >60)
Glucose, Bld: 135 mg/dL — ABNORMAL HIGH (ref 70–99)
Potassium: 3.3 mmol/L — ABNORMAL LOW (ref 3.5–5.1)
Sodium: 138 mmol/L (ref 135–145)

## 2023-04-06 LAB — VITAMIN B12: Vitamin B-12: 223 pg/mL (ref 180–914)

## 2023-04-06 LAB — FERRITIN: Ferritin: 232 ng/mL (ref 11–307)

## 2023-04-06 LAB — DIGOXIN LEVEL: Digoxin Level: 0.7 ng/mL — ABNORMAL LOW (ref 0.8–2.0)

## 2023-04-06 MED ORDER — CYANOCOBALAMIN 1000 MCG/ML IJ SOLN
1000.0000 ug | Freq: Once | INTRAMUSCULAR | Status: AC
  Administered 2023-04-06: 1000 ug via INTRAMUSCULAR
  Filled 2023-04-06: qty 1

## 2023-04-06 NOTE — Patient Instructions (Signed)
[facility] [place]  Discharge Instructions: Thank you for choosing [facility] to provide your oncology and hematology care.  If you have a lab appointment with the Cancer Center - please note that after April 8th, 2024, all labs will be drawn in the cancer center.  You do not have to check in or register with the main entrance as you have in the past but will complete your check-in in the cancer center.  Wear comfortable clothing and clothing appropriate for easy access to any Portacath or PICC line.   We strive to give you quality time with your provider. You may need to reschedule your appointment if you arrive late (15 or more minutes).  Arriving late affects you and other patients whose appointments are after yours.  Also, if you miss three or more appointments without notifying the office, you may be dismissed from the clinic at the provider's discretion.      For prescription refill requests, have your pharmacy contact our office and allow 72 hours for refills to be completed.    Today you received the following B12 injection, return as scheduled.   To help prevent nausea and vomiting after your treatment, we encourage you to take your nausea medication as directed.  BELOW ARE SYMPTOMS THAT SHOULD BE REPORTED IMMEDIATELY: *FEVER GREATER THAN 100.4 F (38 C) OR HIGHER *CHILLS OR SWEATING *NAUSEA AND VOMITING THAT IS NOT CONTROLLED WITH YOUR NAUSEA MEDICATION *UNUSUAL SHORTNESS OF BREATH *UNUSUAL BRUISING OR BLEEDING *URINARY PROBLEMS (pain or burning when urinating, or frequent urination) *BOWEL PROBLEMS (unusual diarrhea, constipation, pain near the anus) TENDERNESS IN MOUTH AND THROAT WITH OR WITHOUT PRESENCE OF ULCERS (sore throat, sores in mouth, or a toothache) UNUSUAL RASH, SWELLING OR PAIN  UNUSUAL VAGINAL DISCHARGE OR ITCHING   Items with * indicate a potential emergency and should be followed up as soon as possible or go to the Emergency  Department if any problems should occur.  Please show the CHEMOTHERAPY ALERT CARD or IMMUNOTHERAPY ALERT CARD at check-in to the Emergency Department and triage nurse.  Should you have questions after your visit or need to cancel or reschedule your appointment, please contact [facility] [place] [phone]  and follow the prompts.  Office hours are 8:00 a.m. to 4:30 p.m. Monday - Friday. Please note that voicemails left after 4:00 p.m. may not be returned until the following business day.  We are closed weekends and major holidays. You have access to a nurse at all times for urgent questions. Please call the main number to the clinic [phone] and follow the prompts.  For any non-urgent questions, you may also contact your provider using MyChart. We now offer e-Visits for anyone 18 and older to request care online for non-urgent symptoms. For details visit [email or web address].   Also download the MyChart app! Go to the app store, search "MyChart", open the app, select [place], and log in with your MyChart username and password.   

## 2023-04-06 NOTE — Progress Notes (Signed)
Patient tolerated injection with no complaints voiced. Site clean and dry with no bruising or swelling noted at site. See MAR for details. Band aid applied.  Patient stable during and after injection. VSS with discharge and left in satisfactory condition with no s/s of distress noted.  

## 2023-04-06 NOTE — Progress Notes (Unsigned)
[facility] [address], [place] [address]   Clinic Day:  04/07/2023  Referring physician: Doreatha Massed, MD  Patient Care Team: Doreatha Massed, MD as PCP - General (Hematology) Doreatha Massed, MD as Medical Oncologist (Hematology)   ASSESSMENT & PLAN:   Assessment:  1.  Severe microcytic anemia from iron deficiency: - CBC (11/06/2022): Hb-10.1, MCV-73.3 - 10/30/2022: Ferritin-12, percent saturation 12 - Feraheme x 2 (3/19 and 3/26) - She is not on oral iron therapy.  No prior history of blood transfusion.  Denies any bleeding per rectum or melena. - Last colonoscopy at [facility] long time ago.  She thinks she had at least 5 colonoscopies.  2.  Social/family history: - Lives with a friend at home.  She is independent of ADLs and IADLs.  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  Non-smoker. - Son has anemia.  Mother had ovarian cancer.  Father had prostate cancer.  Sister had melanoma.  Plan:  1.  Microcytic anemia from iron deficiency: -Received 2 doses of IV Feraheme on 12/29/2022 and 01/05/2023. -Denies any melena, hematochezia, hemoptysis or nosebleeds.  Fecal occult x 3 was negative. -Received weekly B12 injections x 4 and monthly x 1.  She is due for her second monthly B12 injection today. -Labs from 04/06/2023 show hemoglobin of 13.5, platelet count 277,000 with a normal differential.  B12 level is 223.  Ferritin 232.  Iron saturation 25%. -Recommend continuing monthly B12 injections and f/u labs in 4-6 months.  2.  Hypokalemia: -Potassium level of 3.3. -She will be seeing her PCP tomorrow to discuss her low potassium level.  -She is on both furosemide and Aldactone prescribed by her PCP.  3.  Elevated kappa lambda free light chain: -Kappa lambda light chain ratio 1.83.  M spike not observed.  Likely secondary to CKD but does need an immunofixation electrophoresis for further evaluation of  elevated light chains.  This was not collected today but will be added when she has her potassium rechecked in 2 weeks  4.  Right-sided rib pain: -X 2 weeks.  Denies injury. -Recommend x-ray to evaluate.   -Discussed results with patient.  No evidence of fracture.  PLAN SUMMARY: >> Monthly B12 injections x 6.  She will get 1 today. >> Recheck potassium level in [redacted] weeks along with immunofixation electrophoresis (04/21/23). >> Return to clinic monthly for B12 injections and in 6 months for lab work and see MD/NP.    I spent 20 minutes dedicated to the care of this patient (face-to-face and non-face-to-face) on the date of the encounter to include what is described in the assessment and plan.    Orders Placed This Encounter  Procedures   DG Ribs Unilateral W/Chest Right    Standing Status:   Future    Number of Occurrences:   1    Standing Expiration Date:   04/05/2024    Order Specific Question:   Reason for Exam (SYMPTOM  OR DIAGNOSIS REQUIRED)    Answer:   Right rib pain    Order Specific Question:   Preferred imaging location?    Answer:   [facility]   Potassium    Standing Status:   Future    Standing Expiration Date:   04/21/2023   Immunofixation electrophoresis    Standing Status:   Future    Standing Expiration Date:   04/05/2024     Mauro Kaufmann, NP   6/26/20249:31  AM  CHIEF COMPLAINT/PURPOSE OF CONSULT:   Diagnosis: Microcytic anemia from iron deficiency  Current Therapy: Feraheme   HISTORY OF PRESENT ILLNESS:   Erica Cervantes is a 77 y.o. female presenting to clinic today for follow-up for anemia.  She was last seen in clinic on 01/07/2023.  At that time, she was found to have a low B12 level, elevated MMA, ferritin 18 and iron saturation 15%.  Hemoglobin was normal.  She also had elevated kappa lambda light chains and kappa lambda light chain ratio.  She received weekly B12 x 4 and monthly x 1.  She states that she started to feel slightly better.  Appetite is  70% and energy levels are 60%.  She has chronic shortness of breath but this is stable.    Reports new onset right-sided rib pain.  Denies injury or mechanical fall.  Reports right bicep soreness as well.  Denies any recent exercises or lifting heavy objects.  Reports swelling to her right bicep has improved over the past week or so.  Has been taking Tylenol which has been helping.  Denies previous history of similar pains.  Recently has been experiencing diarrhea greater than 6 episodes each day for the past 2 days.  Denies any abdominal discomfort.  Took Imodium with relief.    She has received 3 doses of IV Feraheme beginning of the year.  Received 1 dose in January and 2 doses in March.  Tolerated infusions well.  She denies any ice pica.  Denies any bleeding per rectum or melena.  No prior history of blood transfusion.     PAST MEDICAL HISTORY:   Past Medical History: Past Medical History:  Diagnosis Date   Chronic kidney disease    stage 4   Depression    Diabetes mellitus without complication (HCC)    GERD (gastroesophageal reflux disease)    Hypertension    Neuromuscular disorder (HCC)    peripheral neuropathy    Surgical History: Past Surgical History:  Procedure Laterality Date   ABDOMINAL HYSTERECTOMY  89   CHOLECYSTECTOMY     age 19   DILATION AND CURETTAGE OF UTERUS     x 4  1980   RIGHT/LEFT HEART CATH AND CORONARY ANGIOGRAPHY N/A 10/30/2022   Procedure: RIGHT/LEFT HEART CATH AND CORONARY ANGIOGRAPHY;  Surgeon: Dolores Patty, MD;  Location: [facility];  Service: Cardiovascular;  Laterality: N/A;   TONSILLECTOMY     age 37   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   Procedure: TOTAL HIP ARTHROPLASTY ANTERIOR APPROACH;  Surgeon: Jodi Geralds, MD;  Location: [facility];  Service: Orthopedics;  Laterality: Right;    Social History: Social History   Socioeconomic History   Marital status: Single    Spouse name: Not on file   Number of children: Not on  file   Years of education: Not on file   Highest education level: Not on file  Occupational History   Not on file  Tobacco Use   Smoking status: Never    Passive exposure: Never   Smokeless tobacco: Never  Vaping Use   Vaping Use: Never used  Substance and Sexual Activity   Alcohol use: No   Drug use: No   Sexual activity: Not on file  Other Topics Concern   Not on file  Social History Narrative   Not on file   Social Determinants of Health   Financial Resource Strain: Not on file  Food Insecurity: No Food Insecurity (12/25/2022)   Hunger Vital  Sign    Worried About Programme researcher, broadcasting/film/video in the Last Year: Never true    Ran Out of Food in the Last Year: Never true  Transportation Needs: No Transportation Needs (12/25/2022)   PRAPARE - Administrator, Civil Service (Medical): No    Lack of Transportation (Non-Medical): No  Physical Activity: Not on file  Stress: Not on file  Social Connections: Not on file  Intimate Partner Violence: Not At Risk (12/25/2022)   Humiliation, Afraid, Rape, and Kick questionnaire    Fear of Current or Ex-Partner: No    Emotionally Abused: No    Physically Abused: No    Sexually Abused: No    Family History: Family History  Problem Relation Age of Onset   Cancer Mother    Cancer Father    Heart attack Brother    Polycystic kidney disease Brother    Cancer Other     Current Medications:  Current Outpatient Medications:    amphetamine-dextroamphetamine (ADDERALL) 20 MG tablet, Take 20-40 mg by mouth See admin instructions. Take 40 mg (2 tablets) by mouth in the morning and 20 mg (1 tablet) at 2 pm, Disp: , Rfl:    aspirin EC 81 MG tablet, Take 1 tablet (81 mg total) by mouth daily. Swallow whole., Disp: 30 tablet, Rfl: 12   atorvastatin (LIPITOR) 10 MG tablet, Take 10 mg by mouth every Wednesday., Disp: , Rfl:    busPIRone (BUSPAR) 30 MG tablet, Take 30 mg by mouth 3 (three) times daily., Disp: , Rfl:    Calcium  Carb-Cholecalciferol (CALCIUM 500+D PO), Take 1 tablet by mouth daily., Disp: , Rfl:    cholecalciferol (VITAMIN D3) 25 MCG (1000 UNIT) tablet, Take 2,000 Units by mouth daily., Disp: , Rfl:    cycloSPORINE (RESTASIS) 0.05 % ophthalmic emulsion, Place 2 drops into both eyes 2 (two) times daily., Disp: , Rfl:    digoxin (LANOXIN) 0.125 MG tablet, Take 0.5 tablets (0.0625 mg total) by mouth daily., Disp: 15 tablet, Rfl: 0   donepezil (ARICEPT) 5 MG tablet, Take 5 mg by mouth at bedtime., Disp: , Rfl:    DULoxetine (CYMBALTA) 60 MG capsule, Take 120 mg by mouth daily at 12 noon., Disp: , Rfl:    folic acid (FOLVITE) 800 MCG tablet, Take 800-2,400 mcg by mouth daily., Disp: , Rfl:    furosemide (LASIX) 20 MG tablet, Take 1 tablet (20 mg total) by mouth daily., Disp: 90 tablet, Rfl: 4   Guaifenesin (MUCINEX MAXIMUM STRENGTH) 1200 MG TB12, Take 1,200 mg by mouth daily as needed (severe congestion)., Disp: , Rfl:    metFORMIN (GLUCOPHAGE-XR) 500 MG 24 hr tablet, Take 1,000 mg by mouth 2 (two) times daily., Disp: , Rfl:    metoprolol succinate (TOPROL XL) 25 MG 24 hr tablet, Take 1 tablet (25 mg total) by mouth daily., Disp: 60 tablet, Rfl: 5   Multiple Minerals-Vitamins (CAL MAG ZINC +D3) TABS, Take 1 tablet by mouth daily., Disp: , Rfl:    omeprazole (PRILOSEC) 20 MG capsule, Take 20 mg by mouth daily., Disp: , Rfl:    Probiotic Product (PROBIOTIC PO), Take 1 capsule by mouth daily., Disp: , Rfl:    sacubitril-valsartan (ENTRESTO) 49-51 MG, Take 1 tablet by mouth 2 (two) times daily., Disp: 90 tablet, Rfl: 7   spironolactone (ALDACTONE) 25 MG tablet, Take 1 tablet (25 mg total) by mouth daily., Disp: 30 tablet, Rfl: 0   triamcinolone cream (KENALOG) 0.1 %, Apply 1 application. topically 3 (three)  times daily as needed for itching or rash., Disp: , Rfl:    Allergies: Allergies  Allergen Reactions   Sulfa Antibiotics Shortness Of Breath   Ace Inhibitors Cough    Pt tolerates lisinopril    Codeine  Itching and Swelling   Penicillins     Unknown reaction     Statins     Body aches    Tramadol     Numbness    Zetia [Ezetimibe]     Myalgia    Neosporin [Neomycin-Bacitracin Zn-Polymyx] Rash    REVIEW OF SYSTEMS:   Review of Systems  Constitutional:  Positive for fatigue.  HENT:   Negative for nosebleeds.   Respiratory:  Positive for shortness of breath.   Gastrointestinal:  Positive for diarrhea. Negative for nausea and vomiting.  Musculoskeletal:  Positive for arthralgias and myalgias.       Right sides rib pain   Neurological:  Negative for headaches.     VITALS:   Blood pressure 139/79, pulse 65, temperature (!) 97.2 F (36.2 C), temperature source Tympanic, resp. rate 16, weight 135 lb 2.3 oz (61.3 kg), SpO2 100 %.  Wt Readings from Last 3 Encounters:  04/06/23 135 lb 2.3 oz (61.3 kg)  03/17/23 137 lb 12.8 oz (62.5 kg)  02/24/23 140 lb 12.8 oz (63.9 kg)    Body mass index is 23.56 kg/m.   PHYSICAL EXAM:   Physical Exam Constitutional:      Appearance: Normal appearance.  HENT:     Head: Normocephalic and atraumatic.  Eyes:     Pupils: Pupils are equal, round, and reactive to light.  Cardiovascular:     Rate and Rhythm: Normal rate and regular rhythm.     Heart sounds: Normal heart sounds. No murmur heard. Pulmonary:     Effort: Pulmonary effort is normal.     Breath sounds: Normal breath sounds. No wheezing.  Abdominal:     General: Bowel sounds are normal. There is no distension.     Palpations: Abdomen is soft.     Tenderness: There is no abdominal tenderness.  Musculoskeletal:        General: Normal range of motion.     Right upper arm: Swelling and tenderness present.       Arms:     Cervical back: Normal range of motion.     Comments: Right sided rib pain- No obvious deformity Right bicep edema and tenderness with palpation  Skin:    General: Skin is warm and dry.     Findings: No rash.  Neurological:     Mental Status: She is alert and  oriented to person, place, and time.  Psychiatric:        Judgment: Judgment normal.     LABS:      Latest Ref Rng & Units 04/06/2023    9:37 AM 12/25/2022   11:43 AM 11/06/2022    2:31 AM  CBC  WBC 4.0 - 10.5 K/uL 8.3  8.0  11.6   Hemoglobin 12.0 - 15.0 g/dL 95.6  21.3  08.6   Hematocrit 36.0 - 46.0 % 42.4  44.6  33.8   Platelets 150 - 400 K/uL 277  368  395       Latest Ref Rng & Units 04/06/2023    9:37 AM 02/24/2023   12:02 PM 12/04/2022    3:58 PM  CMP  Glucose 70 - 99 mg/dL 578  469  629   BUN 8 - 23 mg/dL 19  19  34   Creatinine 0.44 - 1.00 mg/dL 4.03  4.74  2.59   Sodium 135 - 145 mmol/L 138  135  137   Potassium 3.5 - 5.1 mmol/L 3.3  4.2  4.1   Chloride 98 - 111 mmol/L 100  99  102   CO2 22 - 32 mmol/L 27  28  27    Calcium 8.9 - 10.3 mg/dL 9.5  9.7  9.5      No results found for: "CEA1", "CEA" / No results found for: "CEA1", "CEA" No results found for: "PSA1" No results found for: "CAN199" No results found for: "CAN125"  Lab Results  Component Value Date   TOTALPROTELP 7.1 12/25/2022   ALBUMINELP 3.8 12/25/2022   A1GS 0.2 12/25/2022   A2GS 0.8 12/25/2022   BETS 1.1 12/25/2022   GAMS 1.1 12/25/2022   MSPIKE Not Observed 12/25/2022   SPEI Comment 12/25/2022   Lab Results  Component Value Date   TIBC 248 (L) 04/06/2023   TIBC 417 12/25/2022   TIBC 378 10/30/2022   FERRITIN 232 04/06/2023   FERRITIN 18 12/25/2022   FERRITIN 12 10/30/2022   IRONPCTSAT 25 04/06/2023   IRONPCTSAT 15 12/25/2022   IRONPCTSAT 12 10/30/2022   Lab Results  Component Value Date   LDH 134 12/25/2022     STUDIES:   DG Ribs Unilateral W/Chest Right  Result Date: 04/06/2023 CLINICAL DATA:  Right rib pain EXAM: RIGHT RIBS AND CHEST - 3+ VIEW COMPARISON:  10/30/2022 FINDINGS: No fracture or other bone lesions are seen involving the ribs. There is no evidence of pneumothorax or pleural effusion. Both lungs are clear. Heart size and mediastinal contours are within normal limits.  Previously seen right IJ pulmonary arterial catheter has been removed. IMPRESSION: Negative. Electronically Signed   By: Duanne Guess D.O.   On: 04/06/2023 12:05

## 2023-04-07 ENCOUNTER — Encounter: Payer: Self-pay | Admitting: Hematology

## 2023-04-07 DIAGNOSIS — M1611 Unilateral primary osteoarthritis, right hip: Secondary | ICD-10-CM | POA: Diagnosis not present

## 2023-04-07 DIAGNOSIS — L280 Lichen simplex chronicus: Secondary | ICD-10-CM | POA: Diagnosis not present

## 2023-04-07 DIAGNOSIS — M797 Fibromyalgia: Secondary | ICD-10-CM | POA: Diagnosis not present

## 2023-04-07 DIAGNOSIS — E1122 Type 2 diabetes mellitus with diabetic chronic kidney disease: Secondary | ICD-10-CM | POA: Diagnosis not present

## 2023-04-07 DIAGNOSIS — R413 Other amnesia: Secondary | ICD-10-CM | POA: Diagnosis not present

## 2023-04-07 DIAGNOSIS — Z23 Encounter for immunization: Secondary | ICD-10-CM | POA: Diagnosis not present

## 2023-04-07 DIAGNOSIS — E782 Mixed hyperlipidemia: Secondary | ICD-10-CM | POA: Diagnosis not present

## 2023-04-07 DIAGNOSIS — L03811 Cellulitis of head [any part, except face]: Secondary | ICD-10-CM | POA: Diagnosis not present

## 2023-04-07 DIAGNOSIS — R4582 Worries: Secondary | ICD-10-CM | POA: Diagnosis not present

## 2023-04-07 DIAGNOSIS — N1831 Chronic kidney disease, stage 3a: Secondary | ICD-10-CM | POA: Diagnosis not present

## 2023-04-07 DIAGNOSIS — F900 Attention-deficit hyperactivity disorder, predominantly inattentive type: Secondary | ICD-10-CM | POA: Diagnosis not present

## 2023-04-07 DIAGNOSIS — I10 Essential (primary) hypertension: Secondary | ICD-10-CM | POA: Diagnosis not present

## 2023-04-21 ENCOUNTER — Inpatient Hospital Stay: Payer: PPO | Attending: Hematology

## 2023-04-21 DIAGNOSIS — E538 Deficiency of other specified B group vitamins: Secondary | ICD-10-CM | POA: Insufficient documentation

## 2023-04-21 DIAGNOSIS — R768 Other specified abnormal immunological findings in serum: Secondary | ICD-10-CM

## 2023-04-21 DIAGNOSIS — E876 Hypokalemia: Secondary | ICD-10-CM

## 2023-04-21 LAB — POTASSIUM: Potassium: 4.2 mmol/L (ref 3.5–5.1)

## 2023-04-23 LAB — IMMUNOFIXATION ELECTROPHORESIS
IgA: 256 mg/dL (ref 64–422)
IgG (Immunoglobin G), Serum: 940 mg/dL (ref 586–1602)
IgM (Immunoglobulin M), Srm: 91 mg/dL (ref 26–217)
Total Protein ELP: 6.8 g/dL (ref 6.0–8.5)

## 2023-05-06 ENCOUNTER — Inpatient Hospital Stay: Payer: PPO

## 2023-05-06 VITALS — BP 156/80 | HR 73 | Temp 97.6°F | Resp 18 | Ht 63.0 in | Wt 137.4 lb

## 2023-05-06 DIAGNOSIS — E538 Deficiency of other specified B group vitamins: Secondary | ICD-10-CM | POA: Diagnosis not present

## 2023-05-06 DIAGNOSIS — D509 Iron deficiency anemia, unspecified: Secondary | ICD-10-CM

## 2023-05-06 MED ORDER — CYANOCOBALAMIN 1000 MCG/ML IJ SOLN
1000.0000 ug | Freq: Once | INTRAMUSCULAR | Status: AC
  Administered 2023-05-06: 1000 ug via INTRAMUSCULAR
  Filled 2023-05-06: qty 1

## 2023-05-06 NOTE — Patient Instructions (Signed)
[facility]  Discharge Instructions: Thank you for choosing [facility] to provide your oncology and hematology care.  If you have a lab appointment with the Cancer Center - please note that after April 8th, 2024, all labs will be drawn in the cancer center.  You do not have to check in or register with the main entrance as you have in the past but will complete your check-in in the cancer center.  Wear comfortable clothing and clothing appropriate for easy access to any Portacath or PICC line.   We strive to give you quality time with your provider. You may need to reschedule your appointment if you arrive late (15 or more minutes).  Arriving late affects you and other patients whose appointments are after yours.  Also, if you miss three or more appointments without notifying the office, you may be dismissed from the clinic at the provider's discretion.      For prescription refill requests, have your pharmacy contact our office and allow 72 hours for refills to be completed.    Today you received the following chemotherapy and/or immunotherapy agents B12 injection.  Vitamin B12 Injection What is this medication? Vitamin B12 (VAHY tuh min B12) prevents and treats low vitamin B12 levels in your body. It is used in people who do not get enough vitamin B12 from their diet or when their digestive tract does not absorb enough. Vitamin B12 plays an important role in maintaining the health of your nervous system and red blood cells. This medicine may be used for other purposes; ask your health care provider or pharmacist if you have questions. COMMON BRAND NAME(S): B-12 Compliance Kit, B-12 Injection Kit, Cyomin, Dodex, LA-12, Nutri-Twelve, Physicians EZ Use B-12, Primabalt, Vitamin Deficiency Injectable System - B12 What should I tell my care team before I take this medication? They need to know if you have any of these conditions: Kidney disease Leber's  disease Megaloblastic anemia An unusual or allergic reaction to cyanocobalamin, cobalt, other medications, foods, dyes, or preservatives Pregnant or trying to get pregnant Breast-feeding How should I use this medication? This medication is injected into a muscle or deeply under the skin. It is usually given in a clinic or care team's office. However, your care team may teach you how to inject yourself. Follow all instructions. Talk to your care team about the use of this medication in children. Special care may be needed. Overdosage: If you think you have taken too much of this medicine contact a poison control center or emergency room at once. NOTE: This medicine is only for you. Do not share this medicine with others. What if I miss a dose? If you are given your dose at a clinic or care team's office, call to reschedule your appointment. If you give your own injections, and you miss a dose, take it as soon as you can. If it is almost time for your next dose, take only that dose. Do not take double or extra doses. What may interact with this medication? Alcohol Colchicine This list may not describe all possible interactions. Give your health care provider a list of all the medicines, herbs, non-prescription drugs, or dietary supplements you use. Also tell them if you smoke, drink alcohol, or use illegal drugs. Some items may interact with your medicine. What should I watch for while using this medication? Visit your care team regularly. You may need blood work done while you are taking this medication. You may need  to follow a special diet. Talk to your care team. Limit your alcohol intake and avoid smoking to get the best benefit. What side effects may I notice from receiving this medication? Side effects that you should report to your care team as soon as possible: Allergic reactions--skin rash, itching, hives, swelling of the face, lips, tongue, or throat Swelling of the ankles, hands, or  feet Trouble breathing Side effects that usually do not require medical attention (report to your care team if they continue or are bothersome): Diarrhea This list may not describe all possible side effects. Call your doctor for medical advice about side effects. You may report side effects to FDA at 1-800-FDA-1088. Where should I keep my medication? Keep out of the reach of children. Store at room temperature between 15 and 30 degrees C (59 and 85 degrees F). Protect from light. Throw away any unused medication after the expiration date. NOTE: This sheet is a summary. It may not cover all possible information. If you have questions about this medicine, talk to your doctor, pharmacist, or health care provider.  2024 Elsevier/Gold Standard (2021-06-10 00:00:00)       To help prevent nausea and vomiting after your treatment, we encourage you to take your nausea medication as directed.  BELOW ARE SYMPTOMS THAT SHOULD BE REPORTED IMMEDIATELY: *FEVER GREATER THAN 100.4 F (38 C) OR HIGHER *CHILLS OR SWEATING *NAUSEA AND VOMITING THAT IS NOT CONTROLLED WITH YOUR NAUSEA MEDICATION *UNUSUAL SHORTNESS OF BREATH *UNUSUAL BRUISING OR BLEEDING *URINARY PROBLEMS (pain or burning when urinating, or frequent urination) *BOWEL PROBLEMS (unusual diarrhea, constipation, pain near the anus) TENDERNESS IN MOUTH AND THROAT WITH OR WITHOUT PRESENCE OF ULCERS (sore throat, sores in mouth, or a toothache) UNUSUAL RASH, SWELLING OR PAIN  UNUSUAL VAGINAL DISCHARGE OR ITCHING   Items with * indicate a potential emergency and should be followed up as soon as possible or go to the Emergency Department if any problems should occur.  Please show the CHEMOTHERAPY ALERT CARD or IMMUNOTHERAPY ALERT CARD at check-in to the Emergency Department and triage nurse.  Should you have questions after your visit or need to cancel or reschedule your appointment, please contact [facility] [phone]  and  follow the prompts.  Office hours are 8:00 a.m. to 4:30 p.m. Monday - Friday. Please note that voicemails left after 4:00 p.m. may not be returned until the following business day.  We are closed weekends and major holidays. You have access to a nurse at all times for urgent questions. Please call the main number to the clinic [phone] and follow the prompts.  For any non-urgent questions, you may also contact your provider using MyChart. We now offer e-Visits for anyone 83 and older to request care online for non-urgent symptoms. For details visit [email or web address].   Also download the MyChart app! Go to the app store, search "MyChart", open the app, select [facility], and log in with your MyChart username and password.

## 2023-05-06 NOTE — Progress Notes (Signed)
Erica Cervantes presents today for injection per the provider's orders.  B12  administration without incident; injection site WNL; see MAR for injection details.  Patient tolerated procedure well and without incident.  No questions or complaints noted at this time. Discharged from clinic ambulatory in stable condition. Alert and oriented x 3. F/U with [facility] as scheduled.

## 2023-05-12 DIAGNOSIS — I5020 Unspecified systolic (congestive) heart failure: Secondary | ICD-10-CM | POA: Diagnosis not present

## 2023-05-12 DIAGNOSIS — E1122 Type 2 diabetes mellitus with diabetic chronic kidney disease: Secondary | ICD-10-CM | POA: Diagnosis not present

## 2023-05-12 DIAGNOSIS — E782 Mixed hyperlipidemia: Secondary | ICD-10-CM | POA: Diagnosis not present

## 2023-06-08 ENCOUNTER — Inpatient Hospital Stay: Payer: PPO | Attending: Hematology

## 2023-06-08 VITALS — BP 155/77 | HR 64 | Temp 97.4°F | Resp 18

## 2023-06-08 DIAGNOSIS — E538 Deficiency of other specified B group vitamins: Secondary | ICD-10-CM | POA: Insufficient documentation

## 2023-06-08 DIAGNOSIS — D509 Iron deficiency anemia, unspecified: Secondary | ICD-10-CM

## 2023-06-08 MED ORDER — CYANOCOBALAMIN 1000 MCG/ML IJ SOLN
1000.0000 ug | Freq: Once | INTRAMUSCULAR | Status: AC
  Administered 2023-06-08: 1000 ug via INTRAMUSCULAR
  Filled 2023-06-08: qty 1

## 2023-06-08 NOTE — Patient Instructions (Signed)
 [facility]  Discharge Instructions: Thank you for choosing [facility] to provide your oncology and hematology care.  If you have a lab appointment with the Cancer Center - please note that after April 8th, 2024, all labs will be drawn in the cancer center.  You do not have to check in or register with the main entrance as you have in the past but will complete your check-in in the cancer center.  Wear comfortable clothing and clothing appropriate for easy access to any Portacath or PICC line.   We strive to give you quality time with your provider. You may need to reschedule your appointment if you arrive late (15 or more minutes).  Arriving late affects you and other patients whose appointments are after yours.  Also, if you miss three or more appointments without notifying the office, you may be dismissed from the clinic at the provider's discretion.      For prescription refill requests, have your pharmacy contact our office and allow 72 hours for refills to be completed.    Today you received the following:  Vitamin B12   To help prevent nausea and vomiting after your treatment, we encourage you to take your nausea medication as directed.  BELOW ARE SYMPTOMS THAT SHOULD BE REPORTED IMMEDIATELY: *FEVER GREATER THAN 100.4 F (38 C) OR HIGHER *CHILLS OR SWEATING *NAUSEA AND VOMITING THAT IS NOT CONTROLLED WITH YOUR NAUSEA MEDICATION *UNUSUAL SHORTNESS OF BREATH *UNUSUAL BRUISING OR BLEEDING *URINARY PROBLEMS (pain or burning when urinating, or frequent urination) *BOWEL PROBLEMS (unusual diarrhea, constipation, pain near the anus) TENDERNESS IN MOUTH AND THROAT WITH OR WITHOUT PRESENCE OF ULCERS (sore throat, sores in mouth, or a toothache) UNUSUAL RASH, SWELLING OR PAIN  UNUSUAL VAGINAL DISCHARGE OR ITCHING   Items with * indicate a potential emergency and should be followed up as soon as possible or go to the Emergency Department if any problems  should occur.  Please show the CHEMOTHERAPY ALERT CARD or IMMUNOTHERAPY ALERT CARD at check-in to the Emergency Department and triage nurse.  Should you have questions after your visit or need to cancel or reschedule your appointment, please contact [facility] [phone]  and follow the prompts.  Office hours are 8:00 a.m. to 4:30 p.m. Monday - Friday. Please note that voicemails left after 4:00 p.m. may not be returned until the following business day.  We are closed weekends and major holidays. You have access to a nurse at all times for urgent questions. Please call the main number to the clinic [phone] and follow the prompts.  For any non-urgent questions, you may also contact your provider using MyChart. We now offer e-Visits for anyone 58 and older to request care online for non-urgent symptoms. For details visit [email or web address].   Also download the MyChart app! Go to the app store, search "MyChart", open the app, select , and log in with your MyChart username and password.

## 2023-06-08 NOTE — Progress Notes (Signed)
Patient tolerated Vitamin B 12 injection with no complaints voiced.  Site clean and dry with no bruising or swelling noted.  No complaints of pain.  Discharged with vital signs stable and no signs or symptoms of distress noted.   ?

## 2023-07-07 ENCOUNTER — Inpatient Hospital Stay: Payer: PPO | Attending: Hematology

## 2023-07-07 VITALS — BP 164/77 | HR 67 | Temp 97.1°F | Resp 18 | Ht 63.0 in | Wt 140.0 lb

## 2023-07-07 DIAGNOSIS — E538 Deficiency of other specified B group vitamins: Secondary | ICD-10-CM | POA: Diagnosis not present

## 2023-07-07 DIAGNOSIS — D509 Iron deficiency anemia, unspecified: Secondary | ICD-10-CM

## 2023-07-07 MED ORDER — CYANOCOBALAMIN 1000 MCG/ML IJ SOLN
1000.0000 ug | Freq: Once | INTRAMUSCULAR | Status: AC
  Administered 2023-07-07: 1000 ug via INTRAMUSCULAR
  Filled 2023-07-07: qty 1

## 2023-07-07 MED ORDER — CYANOCOBALAMIN 1000 MCG/ML IJ SOLN: 1000.0000 ug | Freq: Once | INTRAMUSCULAR | Status: DC

## 2023-07-07 NOTE — Patient Instructions (Signed)
[facility]  Discharge Instructions: Thank you for choosing [facility] to provide your oncology and hematology care.  If you have a lab appointment with the Cancer Center - please note that after April 8th, 2024, all labs will be drawn in the cancer center.  You do not have to check in or register with the main entrance as you have in the past but will complete your check-in in the cancer center.  Wear comfortable clothing and clothing appropriate for easy access to any Portacath or PICC line.   We strive to give you quality time with your provider. You may need to reschedule your appointment if you arrive late (15 or more minutes).  Arriving late affects you and other patients whose appointments are after yours.  Also, if you miss three or more appointments without notifying the office, you may be dismissed from the clinic at the provider's discretion.      For prescription refill requests, have your pharmacy contact our office and allow 72 hours for refills to be completed.    Today you received the following Vitamin B12 injection.      To help prevent nausea and vomiting after your treatment, we encourage you to take your nausea medication as directed.  BELOW ARE SYMPTOMS THAT SHOULD BE REPORTED IMMEDIATELY: *FEVER GREATER THAN 100.4 F (38 C) OR HIGHER *CHILLS OR SWEATING *NAUSEA AND VOMITING THAT IS NOT CONTROLLED WITH YOUR NAUSEA MEDICATION *UNUSUAL SHORTNESS OF BREATH *UNUSUAL BRUISING OR BLEEDING *URINARY PROBLEMS (pain or burning when urinating, or frequent urination) *BOWEL PROBLEMS (unusual diarrhea, constipation, pain near the anus) TENDERNESS IN MOUTH AND THROAT WITH OR WITHOUT PRESENCE OF ULCERS (sore throat, sores in mouth, or a toothache) UNUSUAL RASH, SWELLING OR PAIN  UNUSUAL VAGINAL DISCHARGE OR ITCHING   Items with * indicate a potential emergency and should be followed up as soon as possible or go to the Emergency Department if any  problems should occur.  Please show the CHEMOTHERAPY ALERT CARD or IMMUNOTHERAPY ALERT CARD at check-in to the Emergency Department and triage nurse.  Should you have questions after your visit or need to cancel or reschedule your appointment, please contact [facility] CENTER AT [facility] [phone]  and follow the prompts.  Office hours are 8:00 a.m. to 4:30 p.m. Monday - Friday. Please note that voicemails left after 4:00 p.m. may not be returned until the following business day.  We are closed weekends and major holidays. You have access to a nurse at all times for urgent questions. Please call the main number to the clinic [phone] and follow the prompts.  For any non-urgent questions, you may also contact your provider using MyChart. We now offer e-Visits for anyone 71 and older to request care online for non-urgent symptoms. For details visit [email or web address].   Also download the MyChart app! Go to the app store, search "MyChart", open the app, select [place], and log in with your MyChart username and password.

## 2023-07-07 NOTE — Progress Notes (Signed)
Patient in clinic today for Vitamin B12 injection. See MAR for injection information. Patient remained stable throughout injection. Patient discharged from clinic in ambulatory and in stable condition.

## 2023-07-12 DIAGNOSIS — I5020 Unspecified systolic (congestive) heart failure: Secondary | ICD-10-CM | POA: Diagnosis not present

## 2023-07-12 DIAGNOSIS — E782 Mixed hyperlipidemia: Secondary | ICD-10-CM | POA: Diagnosis not present

## 2023-07-12 DIAGNOSIS — E1122 Type 2 diabetes mellitus with diabetic chronic kidney disease: Secondary | ICD-10-CM | POA: Diagnosis not present

## 2023-07-13 ENCOUNTER — Other Ambulatory Visit (HOSPITAL_COMMUNITY): Payer: Self-pay

## 2023-07-14 ENCOUNTER — Other Ambulatory Visit (HOSPITAL_COMMUNITY): Payer: Self-pay

## 2023-07-15 DIAGNOSIS — E1122 Type 2 diabetes mellitus with diabetic chronic kidney disease: Secondary | ICD-10-CM | POA: Diagnosis not present

## 2023-07-15 DIAGNOSIS — E7849 Other hyperlipidemia: Secondary | ICD-10-CM | POA: Diagnosis not present

## 2023-07-15 DIAGNOSIS — D509 Iron deficiency anemia, unspecified: Secondary | ICD-10-CM | POA: Diagnosis not present

## 2023-07-15 DIAGNOSIS — R5383 Other fatigue: Secondary | ICD-10-CM | POA: Diagnosis not present

## 2023-07-15 DIAGNOSIS — D649 Anemia, unspecified: Secondary | ICD-10-CM | POA: Diagnosis not present

## 2023-07-22 DIAGNOSIS — M797 Fibromyalgia: Secondary | ICD-10-CM | POA: Diagnosis not present

## 2023-07-22 DIAGNOSIS — R4582 Worries: Secondary | ICD-10-CM | POA: Diagnosis not present

## 2023-07-22 DIAGNOSIS — Z23 Encounter for immunization: Secondary | ICD-10-CM | POA: Diagnosis not present

## 2023-07-22 DIAGNOSIS — M1611 Unilateral primary osteoarthritis, right hip: Secondary | ICD-10-CM | POA: Diagnosis not present

## 2023-07-22 DIAGNOSIS — F331 Major depressive disorder, recurrent, moderate: Secondary | ICD-10-CM | POA: Diagnosis not present

## 2023-07-22 DIAGNOSIS — L280 Lichen simplex chronicus: Secondary | ICD-10-CM | POA: Diagnosis not present

## 2023-07-22 DIAGNOSIS — E7849 Other hyperlipidemia: Secondary | ICD-10-CM | POA: Diagnosis not present

## 2023-07-22 DIAGNOSIS — R413 Other amnesia: Secondary | ICD-10-CM | POA: Diagnosis not present

## 2023-07-22 DIAGNOSIS — E1122 Type 2 diabetes mellitus with diabetic chronic kidney disease: Secondary | ICD-10-CM | POA: Diagnosis not present

## 2023-07-22 DIAGNOSIS — L03211 Cellulitis of face: Secondary | ICD-10-CM | POA: Diagnosis not present

## 2023-07-22 DIAGNOSIS — I10 Essential (primary) hypertension: Secondary | ICD-10-CM | POA: Diagnosis not present

## 2023-07-22 DIAGNOSIS — F900 Attention-deficit hyperactivity disorder, predominantly inattentive type: Secondary | ICD-10-CM | POA: Diagnosis not present

## 2023-08-02 DIAGNOSIS — H43813 Vitreous degeneration, bilateral: Secondary | ICD-10-CM | POA: Diagnosis not present

## 2023-08-02 DIAGNOSIS — H524 Presbyopia: Secondary | ICD-10-CM | POA: Diagnosis not present

## 2023-08-05 ENCOUNTER — Inpatient Hospital Stay: Payer: PPO | Attending: Hematology

## 2023-08-05 VITALS — BP 154/78 | HR 73 | Temp 96.7°F | Resp 20

## 2023-08-05 DIAGNOSIS — D509 Iron deficiency anemia, unspecified: Secondary | ICD-10-CM

## 2023-08-05 DIAGNOSIS — E538 Deficiency of other specified B group vitamins: Secondary | ICD-10-CM | POA: Diagnosis not present

## 2023-08-05 MED ORDER — CYANOCOBALAMIN 1000 MCG/ML IJ SOLN
1000.0000 ug | Freq: Once | INTRAMUSCULAR | Status: AC
  Administered 2023-08-05: 1000 ug via INTRAMUSCULAR
  Filled 2023-08-05: qty 1

## 2023-08-05 NOTE — Patient Instructions (Signed)
Vitamin B12 Deficiency Vitamin B12 deficiency means that your body does not have enough vitamin B12. The body needs this important vitamin: To make red blood cells. To make genes (DNA). To help the nerves work. If you do not have enough vitamin B12 in your body, you can have health problems, such as not having enough red blood cells in the blood (anemia). What are the causes? Not eating enough foods that contain vitamin B12. Not being able to take in (absorb) vitamin B12 from the food that you eat. Certain diseases. A condition in which the body does not make enough of a certain protein. This results in your body not taking in enough vitamin B12. Having a surgery in which part of the stomach or small intestine is taken out. Taking medicines that make it hard for the body to take in vitamin B12. These include: Heartburn medicines. Some medicines that are used to treat diabetes. What increases the risk? Being an older adult. Eating a vegetarian or vegan diet that does not include any foods that come from animals. Not eating enough foods that contain vitamin B12 while you are pregnant. Taking certain medicines. Having alcoholism. What are the signs or symptoms? In some cases, there are no symptoms. If the condition leads to too few blood cells or nerve damage, symptoms can occur, such as: Feeling weak or tired. Not being hungry. Losing feeling (numbness) or tingling in your hands and feet. Redness and burning of the tongue. Feeling sad (depressed). Confusion or memory problems. Trouble walking. If anemia is very bad, symptoms can include: Being short of breath. Being dizzy. Having a very fast heartbeat. How is this treated? Changing the way you eat and drink, such as: Eating more foods that contain vitamin B12. Drinking little or no alcohol. Getting vitamin B12 shots. Taking vitamin B12 supplements by mouth (orally). Your doctor will tell you the dose that is best for you. Follow  these instructions at home: Eating and drinking  Eat foods that come from animals and have a lot of vitamin B12 in them. These include: Meats and poultry. This includes beef, pork, chicken, [place], and organ meats, such as liver. Seafood, such as clams, rainbow trout, salmon, tuna, and haddock. Eggs. Dairy foods such as milk, yogurt, and cheese. Eat breakfast cereals that have vitamin B12 added to them (are fortified). Check the label. The items listed above may not be a complete list of foods and beverages you can eat and drink. Contact a dietitian for more information. Alcohol use Do not drink alcohol if: Your doctor tells you not to drink. You are pregnant, may be pregnant, or are planning to become pregnant. If you drink alcohol: Limit how much you have to: 0-1 drink a day for women. 0-2 drinks a day for men. Know how much alcohol is in your drink. In the [place] one drink equals one 12 oz bottle of beer (355 mL), one 5 oz glass of wine (148 mL), or one 1 oz glass of hard liquor (44 mL). General instructions Get any vitamin B12 shots if told by your doctor. Take supplements only as told by your doctor. Follow the directions. Keep all follow-up visits. Contact a doctor if: Your symptoms come back. Your symptoms get worse or do not get better with treatment. Get help right away if: You have trouble breathing. You have a very fast heartbeat. You have chest pain. You get dizzy. You faint. These symptoms may be an emergency. Get help right away. Call 911.  Do not wait to see if the symptoms will go away. Do not drive yourself to the hospital. Summary Vitamin B12 deficiency means that your body is not getting enough of the vitamin. In some cases, there are no symptoms of this condition. Treatment may include making a change in the way you eat and drink, getting shots, or taking supplements. Eat foods that have vitamin B12 in them. This information is not intended to replace advice  given to you by your health care provider. Make sure you discuss any questions you have with your health care provider. Document Revised: 05/23/2021 Document Reviewed: 05/23/2021 Elsevier Patient Education  2024 [employer].

## 2023-08-05 NOTE — Progress Notes (Signed)
Patient tolerated B12 injection with no complaints voiced.  Site clean and dry with no bruising or swelling noted at site.  See MAR for details.  Band aid applied.  Patient stable during and after injection.  Vss with discharge and left in satisfactory condition with no s/s of distress noted. All follow ups as scheduled.  Erica Cervantes [employer]

## 2023-08-06 ENCOUNTER — Inpatient Hospital Stay: Payer: PPO

## 2023-08-12 DIAGNOSIS — E782 Mixed hyperlipidemia: Secondary | ICD-10-CM | POA: Diagnosis not present

## 2023-08-12 DIAGNOSIS — I5020 Unspecified systolic (congestive) heart failure: Secondary | ICD-10-CM | POA: Diagnosis not present

## 2023-08-12 DIAGNOSIS — F900 Attention-deficit hyperactivity disorder, predominantly inattentive type: Secondary | ICD-10-CM | POA: Diagnosis not present

## 2023-08-12 DIAGNOSIS — E1165 Type 2 diabetes mellitus with hyperglycemia: Secondary | ICD-10-CM | POA: Diagnosis not present

## 2023-09-01 DIAGNOSIS — F900 Attention-deficit hyperactivity disorder, predominantly inattentive type: Secondary | ICD-10-CM | POA: Diagnosis not present

## 2023-09-01 DIAGNOSIS — E559 Vitamin D deficiency, unspecified: Secondary | ICD-10-CM | POA: Diagnosis not present

## 2023-09-01 DIAGNOSIS — I10 Essential (primary) hypertension: Secondary | ICD-10-CM | POA: Diagnosis not present

## 2023-09-01 DIAGNOSIS — E1165 Type 2 diabetes mellitus with hyperglycemia: Secondary | ICD-10-CM | POA: Diagnosis not present

## 2023-09-01 DIAGNOSIS — E1122 Type 2 diabetes mellitus with diabetic chronic kidney disease: Secondary | ICD-10-CM | POA: Diagnosis not present

## 2023-09-01 DIAGNOSIS — Z0001 Encounter for general adult medical examination with abnormal findings: Secondary | ICD-10-CM | POA: Diagnosis not present

## 2023-09-01 DIAGNOSIS — N183 Chronic kidney disease, stage 3 unspecified: Secondary | ICD-10-CM | POA: Diagnosis not present

## 2023-09-01 DIAGNOSIS — E7849 Other hyperlipidemia: Secondary | ICD-10-CM | POA: Diagnosis not present

## 2023-09-01 DIAGNOSIS — E782 Mixed hyperlipidemia: Secondary | ICD-10-CM | POA: Diagnosis not present

## 2023-09-06 ENCOUNTER — Inpatient Hospital Stay: Payer: PPO | Attending: Hematology

## 2023-09-06 VITALS — BP 135/75 | HR 73 | Temp 97.5°F | Resp 18

## 2023-09-06 DIAGNOSIS — E538 Deficiency of other specified B group vitamins: Secondary | ICD-10-CM | POA: Insufficient documentation

## 2023-09-06 DIAGNOSIS — D509 Iron deficiency anemia, unspecified: Secondary | ICD-10-CM | POA: Insufficient documentation

## 2023-09-06 MED ORDER — CYANOCOBALAMIN 1000 MCG/ML IJ SOLN
1000.0000 ug | Freq: Once | INTRAMUSCULAR | Status: AC
Start: 2023-09-06 — End: 2023-09-06
  Administered 2023-09-06: 1000 ug via INTRAMUSCULAR
  Filled 2023-09-06: qty 1

## 2023-09-06 NOTE — Progress Notes (Signed)
Erica Cervantes presents today for B12 injection per the provider's orders.  Stable during administration without incident; injection site WNL; see MAR for injection details.  Patient tolerated procedure well and without incident.  No questions or complaints noted at this time.

## 2023-09-06 NOTE — Patient Instructions (Signed)
[facility] - A DEPT OF [facility]  Discharge Instructions: Thank you for choosing [facility] to provide your oncology and hematology care.  If you have a lab appointment with the Cancer Center - please note that after April 8th, 2024, all labs will be drawn in the cancer center.  You do not have to check in or register with the main entrance as you have in the past but will complete your check-in in the cancer center.  Wear comfortable clothing and clothing appropriate for easy access to any Portacath or PICC line.   We strive to give you quality time with your provider. You may need to reschedule your appointment if you arrive late (15 or more minutes).  Arriving late affects you and other patients whose appointments are after yours.  Also, if you miss three or more appointments without notifying the office, you may be dismissed from the clinic at the provider's discretion.      For prescription refill requests, have your pharmacy contact our office and allow 72 hours for refills to be completed.    Today you received the following chemotherapy and/or immunotherapy agents b12      To help prevent nausea and vomiting after your treatment, we encourage you to take your nausea medication as directed.  BELOW ARE SYMPTOMS THAT SHOULD BE REPORTED IMMEDIATELY: *FEVER GREATER THAN 100.4 F (38 C) OR HIGHER *CHILLS OR SWEATING *NAUSEA AND VOMITING THAT IS NOT CONTROLLED WITH YOUR NAUSEA MEDICATION *UNUSUAL SHORTNESS OF BREATH *UNUSUAL BRUISING OR BLEEDING *URINARY PROBLEMS (pain or burning when urinating, or frequent urination) *BOWEL PROBLEMS (unusual diarrhea, constipation, pain near the anus) TENDERNESS IN MOUTH AND THROAT WITH OR WITHOUT PRESENCE OF ULCERS (sore throat, sores in mouth, or a toothache) UNUSUAL RASH, SWELLING OR PAIN  UNUSUAL VAGINAL DISCHARGE OR ITCHING   Items with * indicate a potential emergency and should be followed up as  soon as possible or go to the Emergency Department if any problems should occur.  Please show the CHEMOTHERAPY ALERT CARD or IMMUNOTHERAPY ALERT CARD at check-in to the Emergency Department and triage nurse.  Should you have questions after your visit or need to cancel or reschedule your appointment, please contact [place] [facility] - A DEPT OF [facility] [phone]  and follow the prompts.  Office hours are 8:00 a.m. to 4:30 p.m. Monday - Friday. Please note that voicemails left after 4:00 p.m. may not be returned until the following business day.  We are closed weekends and major holidays. You have access to a nurse at all times for urgent questions. Please call the main number to the clinic [phone] and follow the prompts.  For any non-urgent questions, you may also contact your provider using MyChart. We now offer e-Visits for anyone 89 and older to request care online for non-urgent symptoms. For details visit [email or web address].   Also download the MyChart app! Go to the app store, search "MyChart", open the app, select [place], and log in with your MyChart username and password.

## 2023-09-07 DIAGNOSIS — Z0001 Encounter for general adult medical examination with abnormal findings: Secondary | ICD-10-CM | POA: Diagnosis not present

## 2023-09-07 DIAGNOSIS — M1611 Unilateral primary osteoarthritis, right hip: Secondary | ICD-10-CM | POA: Diagnosis not present

## 2023-09-07 DIAGNOSIS — M797 Fibromyalgia: Secondary | ICD-10-CM | POA: Diagnosis not present

## 2023-09-07 DIAGNOSIS — F331 Major depressive disorder, recurrent, moderate: Secondary | ICD-10-CM | POA: Diagnosis not present

## 2023-09-07 DIAGNOSIS — E1122 Type 2 diabetes mellitus with diabetic chronic kidney disease: Secondary | ICD-10-CM | POA: Diagnosis not present

## 2023-09-07 DIAGNOSIS — L280 Lichen simplex chronicus: Secondary | ICD-10-CM | POA: Diagnosis not present

## 2023-09-07 DIAGNOSIS — L03211 Cellulitis of face: Secondary | ICD-10-CM | POA: Diagnosis not present

## 2023-09-07 DIAGNOSIS — R4582 Worries: Secondary | ICD-10-CM | POA: Diagnosis not present

## 2023-09-07 DIAGNOSIS — I10 Essential (primary) hypertension: Secondary | ICD-10-CM | POA: Diagnosis not present

## 2023-09-07 DIAGNOSIS — E7849 Other hyperlipidemia: Secondary | ICD-10-CM | POA: Diagnosis not present

## 2023-09-07 DIAGNOSIS — F900 Attention-deficit hyperactivity disorder, predominantly inattentive type: Secondary | ICD-10-CM | POA: Diagnosis not present

## 2023-09-07 DIAGNOSIS — R413 Other amnesia: Secondary | ICD-10-CM | POA: Diagnosis not present

## 2023-09-08 DIAGNOSIS — F17210 Nicotine dependence, cigarettes, uncomplicated: Secondary | ICD-10-CM | POA: Diagnosis not present

## 2023-09-08 DIAGNOSIS — I10 Essential (primary) hypertension: Secondary | ICD-10-CM | POA: Diagnosis not present

## 2023-09-08 DIAGNOSIS — Z0001 Encounter for general adult medical examination with abnormal findings: Secondary | ICD-10-CM | POA: Diagnosis not present

## 2023-09-08 DIAGNOSIS — E7849 Other hyperlipidemia: Secondary | ICD-10-CM | POA: Diagnosis not present

## 2023-09-08 DIAGNOSIS — I5020 Unspecified systolic (congestive) heart failure: Secondary | ICD-10-CM | POA: Diagnosis not present

## 2023-09-08 DIAGNOSIS — E1122 Type 2 diabetes mellitus with diabetic chronic kidney disease: Secondary | ICD-10-CM | POA: Diagnosis not present

## 2023-09-08 DIAGNOSIS — M797 Fibromyalgia: Secondary | ICD-10-CM | POA: Diagnosis not present

## 2023-09-08 DIAGNOSIS — N183 Chronic kidney disease, stage 3 unspecified: Secondary | ICD-10-CM | POA: Diagnosis not present

## 2023-09-08 DIAGNOSIS — D649 Anemia, unspecified: Secondary | ICD-10-CM | POA: Diagnosis not present

## 2023-09-10 DIAGNOSIS — E782 Mixed hyperlipidemia: Secondary | ICD-10-CM | POA: Diagnosis not present

## 2023-09-10 DIAGNOSIS — E1122 Type 2 diabetes mellitus with diabetic chronic kidney disease: Secondary | ICD-10-CM | POA: Diagnosis not present

## 2023-09-10 DIAGNOSIS — I10 Essential (primary) hypertension: Secondary | ICD-10-CM | POA: Diagnosis not present

## 2023-09-17 DIAGNOSIS — N183 Chronic kidney disease, stage 3 unspecified: Secondary | ICD-10-CM | POA: Diagnosis not present

## 2023-09-23 DIAGNOSIS — H26493 Other secondary cataract, bilateral: Secondary | ICD-10-CM | POA: Diagnosis not present

## 2023-09-30 DIAGNOSIS — N1831 Chronic kidney disease, stage 3a: Secondary | ICD-10-CM | POA: Diagnosis not present

## 2023-10-07 ENCOUNTER — Other Ambulatory Visit: Payer: Self-pay

## 2023-10-07 DIAGNOSIS — E538 Deficiency of other specified B group vitamins: Secondary | ICD-10-CM

## 2023-10-07 DIAGNOSIS — D509 Iron deficiency anemia, unspecified: Secondary | ICD-10-CM

## 2023-10-08 ENCOUNTER — Other Ambulatory Visit: Payer: Self-pay

## 2023-10-08 ENCOUNTER — Inpatient Hospital Stay: Payer: PPO | Attending: Hematology

## 2023-10-08 ENCOUNTER — Inpatient Hospital Stay: Payer: PPO

## 2023-10-08 VITALS — BP 168/82 | HR 68 | Temp 98.1°F | Resp 18

## 2023-10-08 DIAGNOSIS — E538 Deficiency of other specified B group vitamins: Secondary | ICD-10-CM

## 2023-10-08 DIAGNOSIS — D509 Iron deficiency anemia, unspecified: Secondary | ICD-10-CM | POA: Diagnosis not present

## 2023-10-08 LAB — CBC WITH DIFFERENTIAL/PLATELET
Abs Immature Granulocytes: 0.06 10*3/uL (ref 0.00–0.07)
Basophils Absolute: 0.1 10*3/uL (ref 0.0–0.1)
Basophils Relative: 1 %
Eosinophils Absolute: 0.3 10*3/uL (ref 0.0–0.5)
Eosinophils Relative: 3 %
HCT: 40.9 % (ref 36.0–46.0)
Hemoglobin: 13 g/dL (ref 12.0–15.0)
Immature Granulocytes: 1 %
Lymphocytes Relative: 32 %
Lymphs Abs: 2.5 10*3/uL (ref 0.7–4.0)
MCH: 30.7 pg (ref 26.0–34.0)
MCHC: 31.8 g/dL (ref 30.0–36.0)
MCV: 96.5 fL (ref 80.0–100.0)
Monocytes Absolute: 0.7 10*3/uL (ref 0.1–1.0)
Monocytes Relative: 9 %
Neutro Abs: 4.2 10*3/uL (ref 1.7–7.7)
Neutrophils Relative %: 54 %
Platelets: 311 10*3/uL (ref 150–400)
RBC: 4.24 MIL/uL (ref 3.87–5.11)
RDW: 13.6 % (ref 11.5–15.5)
WBC: 7.9 10*3/uL (ref 4.0–10.5)
nRBC: 0 % (ref 0.0–0.2)

## 2023-10-08 LAB — IRON AND TIBC
Iron: 75 ug/dL (ref 28–170)
Saturation Ratios: 25 % (ref 10.4–31.8)
TIBC: 298 ug/dL (ref 250–450)
UIBC: 223 ug/dL

## 2023-10-08 LAB — VITAMIN B12: Vitamin B-12: 277 pg/mL (ref 180–914)

## 2023-10-08 LAB — FOLATE: Folate: 39.1 ng/mL (ref >5.9)

## 2023-10-08 LAB — FERRITIN: Ferritin: 123 ng/mL (ref 11–307)

## 2023-10-08 MED ORDER — CYANOCOBALAMIN 1000 MCG/ML IJ SOLN
1000.0000 ug | Freq: Once | INTRAMUSCULAR | Status: AC
  Administered 2023-10-08: 1000 ug via INTRAMUSCULAR
  Filled 2023-10-08: qty 1

## 2023-10-08 NOTE — Progress Notes (Signed)
Erica Cervantes presents today for injection per the provider's orders. B12 administration without incident; injection site WNL; see MAR for injection details. Patient tolerated procedure well and without incident. No questions or complaints noted at this time.

## 2023-10-10 LAB — METHYLMALONIC ACID, SERUM: Methylmalonic Acid, Quantitative: 222 nmol/L (ref 0–378)

## 2023-10-15 ENCOUNTER — Inpatient Hospital Stay: Payer: PPO | Admitting: Oncology

## 2023-10-15 NOTE — Progress Notes (Deleted)
 [facility] [address] [place] [address]   Clinic Day:  10/15/2023  Referring physician: Rogers Hai, MD  Patient Care Team: Rogers Hai, MD as PCP - General (Hematology) Rogers Hai, MD as Medical Oncologist (Hematology)   ASSESSMENT & PLAN:   Assessment:  1.  Severe microcytic anemia from iron deficiency: - CBC (11/06/2022): Hb-10.1, MCV-73.3 - 10/30/2022: Ferritin-12, percent saturation 12 - Feraheme x 2 (3/19 and 3/26) - She is not on oral iron therapy.  No prior history of blood transfusion.  Denies any bleeding per rectum or melena. - Last colonoscopy at [facility] long time ago.  She thinks she had at least 5 colonoscopies.  2.  Social/family history: - Lives with a friend at home.  She is independent of ADLs and IADLs.  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  Non-smoker. - Son has anemia.  Mother had ovarian cancer.  Father had prostate cancer.  Sister had melanoma.  Plan:  1.  Microcytic anemia from iron deficiency: -Received 2 doses of IV Feraheme on 12/29/2022 and 01/05/2023. -Denies any melena, hematochezia, hemoptysis or nosebleeds.  Fecal occult x 3 was negative. -Received weekly B12 injections x 4 and monthly x 1.  She is due for her second monthly B12 injection today. -Labs from 04/06/2023 show hemoglobin of 13.5, platelet count 277,000 with a normal differential.  B12 level is 223.  Ferritin 232.  Iron saturation 25%. -Recommend continuing monthly B12 injections and f/u labs in 4-6 months.  2.  Hypokalemia: -Potassium level of 3.3. -She will be seeing her PCP tomorrow to discuss her low potassium level.  -She is on both furosemide  and Aldactone  prescribed by her PCP.  3.  Elevated kappa lambda free light chain: -Kappa lambda light chain ratio 1.83.  M spike not observed.  Likely secondary to CKD but does need an immunofixation electrophoresis for further evaluation of  elevated light chains.  This was not collected today but will be added when she has her potassium rechecked in 2 weeks  4.  Right-sided rib pain: -X 2 weeks.  Denies injury. -Recommend x-ray to evaluate.   -Discussed results with patient.  No evidence of fracture.  PLAN SUMMARY: >> Monthly B12 injections x 6.  She will get 1 today. >> Recheck potassium level in [redacted] weeks along with immunofixation electrophoresis (04/21/23). >> Return to clinic monthly for B12 injections and in 6 months for lab work and see MD/NP.    I spent 20 minutes dedicated to the care of this patient (face-to-face and non-face-to-face) on the date of the encounter to include what is described in the assessment and plan.    No orders of the defined types were placed in this encounter.    Delon FORBES Hope, NP   1/3/20259:50 AM  CHIEF COMPLAINT/PURPOSE OF CONSULT:   Diagnosis: Microcytic anemia from iron deficiency  Current Therapy: Feraheme   HISTORY OF PRESENT ILLNESS:   Erica Cervantes is a 78 y.o. female presenting to clinic today for follow-up for anemia.  She was last seen in clinic on 01/07/2023.  At that time, she was found to have a low B12 level, elevated MMA, ferritin 18 and iron saturation 15%.  Hemoglobin was normal.  She also had elevated kappa lambda light chains and kappa lambda light chain ratio.  She received weekly B12 x 4 and monthly x 1.  She states that she started to feel slightly better.  Appetite is 70% and  energy levels are 60%.  She has chronic shortness of breath but this is stable.    Reports new onset right-sided rib pain.  Denies injury or mechanical fall.  Reports right bicep soreness as well.  Denies any recent exercises or lifting heavy objects.  Reports swelling to her right bicep has improved over the past week or so.  Has been taking Tylenol  which has been helping.  Denies previous history of similar pains.  Recently has been experiencing diarrhea greater than 6 episodes each day for the  past 2 days.  Denies any abdominal discomfort.  Took Imodium with relief.    She has received 3 doses of IV Feraheme beginning of the year.  Received 1 dose in January and 2 doses in March.  Tolerated infusions well.  She denies any ice pica.  Denies any bleeding per rectum or melena.  No prior history of blood transfusion.     PAST MEDICAL HISTORY:   Past Medical History: Past Medical History:  Diagnosis Date  . Chronic kidney disease    stage 4  . Depression   . Diabetes mellitus without complication (HCC)   . GERD (gastroesophageal reflux disease)   . Hypertension   . Neuromuscular disorder (HCC)    peripheral neuropathy    Surgical History: Past Surgical History:  Procedure Laterality Date  . ABDOMINAL HYSTERECTOMY  1990  . CHOLECYSTECTOMY     age 75  . DILATION AND CURETTAGE OF UTERUS     x 4  1980  . RIGHT/LEFT HEART CATH AND CORONARY ANGIOGRAPHY N/A 10/30/2022   Procedure: RIGHT/LEFT HEART CATH AND CORONARY ANGIOGRAPHY;  Surgeon: Cherrie Toribio SAUNDERS, MD;  Location: [facility] INVASIVE CV LAB;  Service: Cardiovascular;  Laterality: N/A;  . TONSILLECTOMY     age 49  . 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 Marital status: Single    Spouse name: Not on file  . Number of children: Not on file  . Years of education: Not on file  . Highest education level: Not on file  Occupational History  . Not on file  Tobacco Use  . Smoking status: Never    Passive exposure: Never  . Smokeless tobacco: Never  Vaping Use  . Vaping status: Never Used  Substance and Sexual Activity  . Alcohol  use: No  . Drug use: No  . Sexual activity: Not on file  Other Topics Concern  . Not on file  Social History Narrative  . Not on file   Social Drivers of Health   Financial Resource  Strain: Not on file  Food Insecurity: No Food Insecurity (12/25/2022)   Hunger Vital Sign   . Worried About Programme Researcher, Broadcasting/film/video in the Last Year: Never true   . Ran Out of Food in the Last Year: Never true  Transportation Needs: No Transportation Needs (12/25/2022)   PRAPARE - Transportation   . Lack of Transportation (Medical): No   . Lack of Transportation (Non-Medical): No  Physical Activity: Not on file  Stress: Not on file  Social Connections: Not on file  Intimate Partner Violence: Not At Risk (12/25/2022)   Humiliation, Afraid, Rape, and Kick questionnaire   . Fear of Current or Ex-Partner: No   . Emotionally Abused: No   . Physically Abused: No   . Sexually  Abused: No    Family History: Family History  Problem Relation Age of Onset  . Cancer Mother   . Cancer Father   . Heart attack Brother   . Polycystic kidney disease Brother   . Cancer Other     Current Medications:  Current Outpatient Medications:  .  amphetamine -dextroamphetamine  (ADDERALL) 20 MG tablet, Take 20-40 mg by mouth See admin instructions. Take 40 mg (2 tablets) by mouth in the morning and 20 mg (1 tablet) at 2 pm, Disp: , Rfl:  .  aspirin  EC 81 MG tablet, Take 1 tablet (81 mg total) by mouth daily. Swallow whole., Disp: 30 tablet, Rfl: 12 .  atorvastatin  (LIPITOR) 10 MG tablet, Take 10 mg by mouth every Wednesday., Disp: , Rfl:  .  busPIRone  (BUSPAR ) 30 MG tablet, Take 30 mg by mouth 3 (three) times daily., Disp: , Rfl:  .  Calcium  Carb-Cholecalciferol (CALCIUM  500+D PO), Take 1 tablet by mouth daily., Disp: , Rfl:  .  cholecalciferol (VITAMIN D3) 25 MCG (1000 UNIT) tablet, Take 2,000 Units by mouth daily., Disp: , Rfl:  .  cycloSPORINE (RESTASIS) 0.05 % ophthalmic emulsion, Place 2 drops into both eyes 2 (two) times daily., Disp: , Rfl:  .  digoxin  (LANOXIN ) 0.125 MG tablet, Take 0.5 tablets (0.0625 mg total) by mouth daily., Disp: 15 tablet, Rfl: 0 .  donepezil  (ARICEPT ) 5 MG tablet, Take 5 mg by mouth  at bedtime., Disp: , Rfl:  .  DULoxetine  (CYMBALTA ) 60 MG capsule, Take 120 mg by mouth daily at 12 noon., Disp: , Rfl:  .  folic acid  (FOLVITE ) 800 MCG tablet, Take 800-2,400 mcg by mouth daily., Disp: , Rfl:  .  furosemide  (LASIX ) 20 MG tablet, Take 1 tablet (20 mg total) by mouth daily., Disp: 90 tablet, Rfl: 4 .  Guaifenesin (MUCINEX MAXIMUM STRENGTH) 1200 MG TB12, Take 1,200 mg by mouth daily as needed (severe congestion)., Disp: , Rfl:  .  metFORMIN (GLUCOPHAGE-XR) 500 MG 24 hr tablet, Take 1,000 mg by mouth 2 (two) times daily., Disp: , Rfl:  .  metoprolol  succinate (TOPROL  XL) 25 MG 24 hr tablet, Take 1 tablet (25 mg total) by mouth daily., Disp: 60 tablet, Rfl: 5 .  Multiple Minerals-Vitamins (CAL MAG ZINC +D3) TABS, Take 1 tablet by mouth daily., Disp: , Rfl:  .  omeprazole (PRILOSEC) 20 MG capsule, Take 20 mg by mouth daily., Disp: , Rfl:  .  Probiotic Product (PROBIOTIC PO), Take 1 capsule by mouth daily., Disp: , Rfl:  .  sacubitril -valsartan  (ENTRESTO ) 49-51 MG, Take 1 tablet by mouth 2 (two) times daily., Disp: 90 tablet, Rfl: 7 .  spironolactone  (ALDACTONE ) 25 MG tablet, Take 1 tablet (25 mg total) by mouth daily., Disp: 30 tablet, Rfl: 0 .  triamcinolone  cream (KENALOG ) 0.1 %, Apply 1 application. topically 3 (three) times daily as needed for itching or rash., Disp: , Rfl:    Allergies: Allergies  Allergen Reactions  . Sulfa Antibiotics Shortness Of Breath  . Ace Inhibitors Cough    Pt tolerates lisinopril   . Codeine Itching and Swelling  . Penicillins     Unknown reaction    . Statins     Body aches   . Tramadol     Numbness   . Zetia [Ezetimibe]     Myalgia   . Neosporin [Neomycin-Bacitracin Zn-Polymyx] Rash    REVIEW OF SYSTEMS:   Review of Systems  Constitutional:  Positive for fatigue.  HENT:   Negative for nosebleeds.  Respiratory:  Positive for shortness of breath.   Gastrointestinal:  Positive for diarrhea. Negative for nausea and vomiting.   Musculoskeletal:  Positive for arthralgias and myalgias.       Right sides rib pain   Neurological:  Negative for headaches.     VITALS:   There were no vitals taken for this visit.  Wt Readings from Last 3 Encounters:  07/07/23 140 lb (63.5 kg)  05/06/23 137 lb 6.4 oz (62.3 kg)  04/06/23 135 lb 2.3 oz (61.3 kg)    There is no height or weight on file to calculate BMI.   PHYSICAL EXAM:   Physical Exam Constitutional:      Appearance: Normal appearance.  HENT:     Head: Normocephalic and atraumatic.  Eyes:     Pupils: Pupils are equal, round, and reactive to light.  Cardiovascular:     Rate and Rhythm: Normal rate and regular rhythm.     Heart sounds: Normal heart sounds. No murmur heard. Pulmonary:     Effort: Pulmonary effort is normal.     Breath sounds: Normal breath sounds. No wheezing.  Abdominal:     General: Bowel sounds are normal. There is no distension.     Palpations: Abdomen is soft.     Tenderness: There is no abdominal tenderness.  Musculoskeletal:        General: Normal range of motion.     Right upper arm: Swelling and tenderness present.       Arms:     Cervical back: Normal range of motion.     Comments: Right sided rib pain- No obvious deformity Right bicep edema and tenderness with palpation  Skin:    General: Skin is warm and dry.     Findings: No rash.  Neurological:     Mental Status: She is alert and oriented to person, place, and time.  Psychiatric:        Judgment: Judgment normal.    LABS:      Latest Ref Rng & Units 10/08/2023    9:22 AM 04/06/2023    9:37 AM 12/25/2022   11:43 AM  CBC  WBC 4.0 - 10.5 K/uL 7.9  8.3  8.0   Hemoglobin 12.0 - 15.0 g/dL 86.9  86.4  86.4   Hematocrit 36.0 - 46.0 % 40.9  42.4  44.6   Platelets 150 - 400 K/uL 311  277  368       Latest Ref Rng & Units 04/21/2023    9:16 AM 04/06/2023    9:37 AM 02/24/2023   12:02 PM  CMP  Glucose 70 - 99 mg/dL  864  840   BUN 8 - 23 mg/dL  19  19   Creatinine  9.55 - 1.00 mg/dL  8.94  8.88   Sodium 864 - 145 mmol/L  138  135   Potassium 3.5 - 5.1 mmol/L 4.2  3.3  4.2   Chloride 98 - 111 mmol/L  100  99   CO2 22 - 32 mmol/L  27  28   Calcium  8.9 - 10.3 mg/dL  9.5  9.7      No results found for: CEA1, CEA / No results found for: CEA1, CEA No results found for: PSA1 No results found for: CAN199 No results found for: CAN125  Lab Results  Component Value Date   TOTALPROTELP 6.8 04/21/2023   ALBUMINELP 3.8 12/25/2022   A1GS 0.2 12/25/2022   A2GS 0.8 12/25/2022   BETS 1.1 12/25/2022  GAMS 1.1 12/25/2022   MSPIKE Not Observed 12/25/2022   SPEI Comment 12/25/2022   Lab Results  Component Value Date   TIBC 298 10/08/2023   TIBC 248 (L) 04/06/2023   TIBC 417 12/25/2022   FERRITIN 123 10/08/2023   FERRITIN 232 04/06/2023   FERRITIN 18 12/25/2022   IRONPCTSAT 25 10/08/2023   IRONPCTSAT 25 04/06/2023   IRONPCTSAT 15 12/25/2022   Lab Results  Component Value Date   LDH 134 12/25/2022     STUDIES:   No results found.

## 2023-10-29 ENCOUNTER — Inpatient Hospital Stay: Payer: PPO

## 2023-10-29 ENCOUNTER — Inpatient Hospital Stay: Payer: PPO | Attending: Hematology | Admitting: Oncology

## 2023-10-29 ENCOUNTER — Ambulatory Visit (HOSPITAL_COMMUNITY)
Admission: RE | Admit: 2023-10-29 | Discharge: 2023-10-29 | Disposition: A | Discharge status: Home / Self Care | Payer: PPO | Source: Ambulatory Visit | Attending: Oncology | Admitting: Oncology

## 2023-10-29 VITALS — BP 144/83 | HR 65 | Temp 98.0°F | Resp 18 | Wt 140.3 lb

## 2023-10-29 DIAGNOSIS — D509 Iron deficiency anemia, unspecified: Secondary | ICD-10-CM

## 2023-10-29 DIAGNOSIS — R140 Abdominal distension (gaseous): Secondary | ICD-10-CM

## 2023-10-29 DIAGNOSIS — R109 Unspecified abdominal pain: Secondary | ICD-10-CM | POA: Diagnosis not present

## 2023-10-29 DIAGNOSIS — E1369 Other specified diabetes mellitus with other specified complication: Secondary | ICD-10-CM

## 2023-10-29 DIAGNOSIS — E538 Deficiency of other specified B group vitamins: Secondary | ICD-10-CM | POA: Insufficient documentation

## 2023-10-29 DIAGNOSIS — R110 Nausea: Secondary | ICD-10-CM | POA: Diagnosis not present

## 2023-10-29 DIAGNOSIS — R768 Other specified abnormal immunological findings in serum: Secondary | ICD-10-CM | POA: Diagnosis not present

## 2023-10-29 MED ORDER — CYANOCOBALAMIN 1000 MCG/ML IJ SOLN
1000.0000 ug | Freq: Once | INTRAMUSCULAR | Status: AC
  Administered 2023-10-29: 1000 ug via INTRAMUSCULAR
  Filled 2023-10-29: qty 1

## 2023-10-29 NOTE — Progress Notes (Signed)
[facility] [address], [place] [address]   Clinic Day:  10/29/2023  Referring physician: Doreatha Massed, MD  Patient Care Team: Doreatha Massed, MD as PCP - General (Hematology) Doreatha Massed, MD as Medical Oncologist (Hematology)   ASSESSMENT & PLAN:   Assessment:  1.  Severe microcytic anemia from iron deficiency: - CBC (11/06/2022): Hb-10.1, MCV-73.3 - 10/30/2022: Ferritin-12, percent saturation 12 - Feraheme x 2 (3/19 and 3/26) - She is not on oral iron therapy.  No prior history of blood transfusion.  Denies any bleeding per rectum or melena. - Last colonoscopy at [facility] long time ago.  She thinks she had at least 5 colonoscopies.  2.  Social/family history: - Lives with a friend at home.  She is independent of ADLs and IADLs.  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  Non-smoker. - Son has anemia.  Mother had ovarian cancer.  Father had prostate cancer.  Sister had melanoma.  Plan:  1.  Microcytic anemia from iron deficiency: -Received 2 doses of IV Feraheme on 12/29/2022 and 01/05/2023. -Denies any melena, hematochezia, hemoptysis or nosebleeds.  Fecal occult x 3 was negative. -Received weekly B12 injections x 4 and monthly x 6.  Last B12 injection was on 10/08/2023. -Labs from 10/08/2023 show a B12 level of 277 with an MMA of 222.  Ferritin 123, folate 39.1 and iron saturation 25% and TIBC 298.  Hemoglobin is 13.0, MCV 96.5 with normal platelet count.  Differential is unremarkable. -Recommend continuing monthly B12 injections and f/u labs in 4-6 months. -Proceed with B12 injection today although it is a week early.  2.  Hypokalemia: -Resolved  3.  Elevated kappa lambda free light chain: -Kappa lambda light chain ratio 1.83.  M spike not observed.  Immunofixation electrophoresis on 04/21/2023 was unremarkable.  No evidence of monoclonal protein. -Likely secondary to CKD.  -Will continue  to monitor.  4.  Abdominal bloating: -Unclear etiology. -Patient does not have a gastroenterologist but does have a PCP. -We discussed abdominal x-ray today and if any abnormality would recommend follow-up with PCP and/or establish care with gastroenterology.  5.  Foot pain: -Patient would like to establish care with podiatry given history of diabetes and small foot ulcer she noticed over the past few weeks. -Referral sent but it is an external referral which means patient will likely have to call and schedule an appointment on her own time.  PLAN SUMMARY: >> Monthly B12 injections x 6.  She will get one today. >> Return to clinic monthly for B12 injections and in 6 months for lab work and see MD/NP.    I spent 20 minutes dedicated to the care of this patient (face-to-face and non-face-to-face) on the date of the encounter to include what is described in the assessment and plan.    No orders of the defined types were placed in this encounter.  Mauro Kaufmann, NP   1/17/202511:35 AM  CHIEF COMPLAINT/PURPOSE OF CONSULT:   Diagnosis: Microcytic anemia from iron deficiency  Current Therapy: Feraheme   HISTORY OF PRESENT ILLNESS:   Erica Cervantes is a 78 y.o. female presenting to clinic today for follow-up for anemia.  She was last seen in clinic on 04/06/2023.   She has been receiving monthly B12 injections for over 6 months now.  Last B12 injection was given on 10/08/2023.  Appetite is 100% and energy levels are 50%.  Reports a myriad of complaints.  States over  the past few months she has noticed an increase in abdominal bloating and discomfort.  States she knows she has not been eating right but feels like her belly is full fluid.  Reports normal bowel movements.  No evidence of bleeding.  Having some acute chest pain often times late in the afternoons or when she is really full.  Has cough and shortness of breath that is slightly worse over the past 2 months.  Her appetite has increased  dramatically.  Reports increased energy levels after receiving B12 injections but continues to "give out easily".  Reports new onset foot pain and noticed some blood blisters on her toes.  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  She would like to be referred to a podiatrist.   She denies any ice pica.  Denies any bleeding per rectum or melena.  No prior history of blood transfusion.     PAST MEDICAL HISTORY:   Past Medical History: Past Medical History:  Diagnosis Date   Chronic kidney disease    stage 4   Depression    Diabetes mellitus without complication (HCC)    GERD (gastroesophageal reflux disease)    Hypertension    Neuromuscular disorder (HCC)    peripheral neuropathy    Surgical History: Past Surgical History:  Procedure Laterality Date   ABDOMINAL HYSTERECTOMY  49   CHOLECYSTECTOMY     age 46   DILATION AND CURETTAGE OF UTERUS     x 4  1980   RIGHT/LEFT HEART CATH AND CORONARY ANGIOGRAPHY N/A 10/30/2022   Procedure: RIGHT/LEFT HEART CATH AND CORONARY ANGIOGRAPHY;  Surgeon: Dolores Patty, MD;  Location: [facility];  Service: Cardiovascular;  Laterality: N/A;   TONSILLECTOMY     age 29   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   Procedure: TOTAL HIP ARTHROPLASTY ANTERIOR APPROACH;  Surgeon: Jodi Geralds, MD;  Location: [facility];  Service: Orthopedics;  Laterality: Right;    Social History: Social History   Socioeconomic History   Marital status: Single    Spouse name: Not on file   Number of children: Not on file   Years of education: Not on file   Highest education level: Not on file  Occupational History   Not on file  Tobacco Use   Smoking status: Never    Passive exposure: Never   Smokeless tobacco: Never  Vaping Use   Vaping status: Never Used  Substance and Sexual Activity   Alcohol use: No   Drug use: No   Sexual activity: Not on file  Other Topics Concern   Not on file  Social History Narrative   Not on  file   Social Drivers of Health   Financial Resource Strain: Not on file  Food Insecurity: No Food Insecurity (12/25/2022)   Hunger Vital Sign    Worried About Running Out of Food in the Last Year: Never true    Ran Out of Food in the Last Year: Never true  Transportation Needs: No Transportation Needs (12/25/2022)   PRAPARE - Administrator, Civil Service (Medical): No    Lack of Transportation (Non-Medical): No  Physical Activity: Not on file  Stress: Not on file  Social Connections: Not on file  Intimate Partner Violence: Not At Risk (12/25/2022)   Humiliation, Afraid, Rape, and Kick questionnaire    Fear of Current or Ex-Partner: No    Emotionally Abused: No    Physically Abused: No    Sexually Abused: No  Family History: Family History  Problem Relation Age of Onset   Cancer Mother    Cancer Father    Heart attack Brother    Polycystic kidney disease Brother    Cancer Other     Current Medications:  Current Outpatient Medications:    amphetamine-dextroamphetamine (ADDERALL) 20 MG tablet, Take 20-40 mg by mouth See admin instructions. Take 40 mg (2 tablets) by mouth in the morning and 20 mg (1 tablet) at 2 pm, Disp: , Rfl:    aspirin EC 81 MG tablet, Take 1 tablet (81 mg total) by mouth daily. Swallow whole., Disp: 30 tablet, Rfl: 12   atorvastatin (LIPITOR) 10 MG tablet, Take 10 mg by mouth every Wednesday., Disp: , Rfl:    busPIRone (BUSPAR) 30 MG tablet, Take 30 mg by mouth 3 (three) times daily., Disp: , Rfl:    Calcium Carb-Cholecalciferol (CALCIUM 500+D PO), Take 1 tablet by mouth daily., Disp: , Rfl:    cholecalciferol (VITAMIN D3) 25 MCG (1000 UNIT) tablet, Take 2,000 Units by mouth daily., Disp: , Rfl:    cycloSPORINE (RESTASIS) 0.05 % ophthalmic emulsion, Place 2 drops into both eyes 2 (two) times daily., Disp: , Rfl:    digoxin (LANOXIN) 0.125 MG tablet, Take 0.5 tablets (0.0625 mg total) by mouth daily., Disp: 15 tablet, Rfl: 0   donepezil  (ARICEPT) 5 MG tablet, Take 5 mg by mouth at bedtime., Disp: , Rfl:    DULoxetine (CYMBALTA) 60 MG capsule, Take 120 mg by mouth daily at 12 noon., Disp: , Rfl:    folic acid (FOLVITE) 800 MCG tablet, Take 800-2,400 mcg by mouth daily., Disp: , Rfl:    furosemide (LASIX) 20 MG tablet, Take 1 tablet (20 mg total) by mouth daily., Disp: 90 tablet, Rfl: 4   Guaifenesin (MUCINEX MAXIMUM STRENGTH) 1200 MG TB12, Take 1,200 mg by mouth daily as needed (severe congestion)., Disp: , Rfl:    metFORMIN (GLUCOPHAGE-XR) 500 MG 24 hr tablet, Take 1,000 mg by mouth 2 (two) times daily., Disp: , Rfl:    metoprolol succinate (TOPROL XL) 25 MG 24 hr tablet, Take 1 tablet (25 mg total) by mouth daily., Disp: 60 tablet, Rfl: 5   Multiple Minerals-Vitamins (CAL MAG ZINC +D3) TABS, Take 1 tablet by mouth daily., Disp: , Rfl:    omeprazole (PRILOSEC) 20 MG capsule, Take 20 mg by mouth daily., Disp: , Rfl:    Probiotic Product (PROBIOTIC PO), Take 1 capsule by mouth daily., Disp: , Rfl:    sacubitril-valsartan (ENTRESTO) 49-51 MG, Take 1 tablet by mouth 2 (two) times daily., Disp: 90 tablet, Rfl: 7   spironolactone (ALDACTONE) 25 MG tablet, Take 1 tablet (25 mg total) by mouth daily., Disp: 30 tablet, Rfl: 0   triamcinolone cream (KENALOG) 0.1 %, Apply 1 application. topically 3 (three) times daily as needed for itching or rash., Disp: , Rfl:    Allergies: Allergies  Allergen Reactions   Sulfa Antibiotics Shortness Of Breath   Ace Inhibitors Cough    Pt tolerates lisinopril    Codeine Itching and Swelling   Penicillins     Unknown reaction     Statins     Body aches    Tramadol     Numbness    Zetia [Ezetimibe]     Myalgia    Neosporin [Neomycin-Bacitracin Zn-Polymyx] Rash    REVIEW OF SYSTEMS:   Review of Systems  Respiratory:  Positive for cough and shortness of breath.   Cardiovascular:  Positive for chest pain.  Gastrointestinal:  Positive for nausea.  Musculoskeletal:  Positive for arthralgias,  back pain and neck pain.  Neurological:  Positive for dizziness and numbness.     VITALS:   There were no vitals taken for this visit.  Wt Readings from Last 3 Encounters:  07/07/23 140 lb (63.5 kg)  05/06/23 137 lb 6.4 oz (62.3 kg)  04/06/23 135 lb 2.3 oz (61.3 kg)    There is no height or weight on file to calculate BMI.   PHYSICAL EXAM:   Physical Exam Constitutional:      Appearance: Normal appearance.  Cardiovascular:     Rate and Rhythm: Normal rate and regular rhythm.  Pulmonary:     Effort: Pulmonary effort is normal.     Breath sounds: Normal breath sounds.  Abdominal:     General: Bowel sounds are normal.     Palpations: Abdomen is soft.     Tenderness: There is no abdominal tenderness.     Comments: No reproducible abdominal pain  Musculoskeletal:        General: No swelling. Normal range of motion.  Neurological:     Mental Status: She is alert and oriented to person, place, and time. Mental status is at baseline.     LABS:      Latest Ref Rng & Units 10/08/2023    9:22 AM 04/06/2023    9:37 AM 12/25/2022   11:43 AM  CBC  WBC 4.0 - 10.5 K/uL 7.9  8.3  8.0   Hemoglobin 12.0 - 15.0 g/dL 56.2  13.0  86.5   Hematocrit 36.0 - 46.0 % 40.9  42.4  44.6   Platelets 150 - 400 K/uL 311  277  368       Latest Ref Rng & Units 04/21/2023    9:16 AM 04/06/2023    9:37 AM 02/24/2023   12:02 PM  CMP  Glucose 70 - 99 mg/dL  784  696   BUN 8 - 23 mg/dL  19  19   Creatinine 2.95 - 1.00 mg/dL  2.84  1.32   Sodium 440 - 145 mmol/L  138  135   Potassium 3.5 - 5.1 mmol/L 4.2  3.3  4.2   Chloride 98 - 111 mmol/L  100  99   CO2 22 - 32 mmol/L  27  28   Calcium 8.9 - 10.3 mg/dL  9.5  9.7      No results found for: "CEA1", "CEA" / No results found for: "CEA1", "CEA" No results found for: "PSA1" No results found for: "CAN199" No results found for: "CAN125"  Lab Results  Component Value Date   TOTALPROTELP 6.8 04/21/2023   ALBUMINELP 3.8 12/25/2022   A1GS 0.2  12/25/2022   A2GS 0.8 12/25/2022   BETS 1.1 12/25/2022   GAMS 1.1 12/25/2022   MSPIKE Not Observed 12/25/2022   SPEI Comment 12/25/2022   Lab Results  Component Value Date   TIBC 298 10/08/2023   TIBC 248 (L) 04/06/2023   TIBC 417 12/25/2022   FERRITIN 123 10/08/2023   FERRITIN 232 04/06/2023   FERRITIN 18 12/25/2022   IRONPCTSAT 25 10/08/2023   IRONPCTSAT 25 04/06/2023   IRONPCTSAT 15 12/25/2022   Lab Results  Component Value Date   LDH 134 12/25/2022     STUDIES:   No results found.

## 2023-10-29 NOTE — Patient Instructions (Signed)
 [facility] [place] - A DEPT OF [facility]  Discharge Instructions: Thank you for choosing [facility] to provide your oncology and hematology care.  If you have a lab appointment with the Cancer Center - please note that after April 8th, 2024, all labs will be drawn in the cancer center.  You do not have to check in or register with the main entrance as you have in the past but will complete your check-in in the cancer center.  Wear comfortable clothing and clothing appropriate for easy access to any Portacath or PICC line.   We strive to give you quality time with your provider. You may need to reschedule your appointment if you arrive late (15 or more minutes).  Arriving late affects you and other patients whose appointments are after yours.  Also, if you miss three or more appointments without notifying the office, you may be dismissed from the clinic at the provider's discretion.      For prescription refill requests, have your pharmacy contact our office and allow 72 hours for refills to be completed.    Today you received the following: B12 injection   To help prevent nausea and vomiting after your treatment, we encourage you to take your nausea medication as directed.  BELOW ARE SYMPTOMS THAT SHOULD BE REPORTED IMMEDIATELY: *FEVER GREATER THAN 100.4 F (38 C) OR HIGHER *CHILLS OR SWEATING *NAUSEA AND VOMITING THAT IS NOT CONTROLLED WITH YOUR NAUSEA MEDICATION *UNUSUAL SHORTNESS OF BREATH *UNUSUAL BRUISING OR BLEEDING *URINARY PROBLEMS (pain or burning when urinating, or frequent urination) *BOWEL PROBLEMS (unusual diarrhea, constipation, pain near the anus) TENDERNESS IN MOUTH AND THROAT WITH OR WITHOUT PRESENCE OF ULCERS (sore throat, sores in mouth, or a toothache) UNUSUAL RASH, SWELLING OR PAIN  UNUSUAL VAGINAL DISCHARGE OR ITCHING   Items with * indicate a potential emergency and should be followed up as soon as possible or go to the  Emergency Department if any problems should occur.  Please show the CHEMOTHERAPY ALERT CARD or IMMUNOTHERAPY ALERT CARD at check-in to the Emergency Department and triage nurse.  Should you have questions after your visit or need to cancel or reschedule your appointment, please contact [facility] [place] - A DEPT OF [facility] [phone]  and follow the prompts.  Office hours are 8:00 a.m. to 4:30 p.m. Monday - Friday. Please note that voicemails left after 4:00 p.m. may not be returned until the following business day.  We are closed weekends and major holidays. You have access to a nurse at all times for urgent questions. Please call the main number to the clinic [phone] and follow the prompts.  For any non-urgent questions, you may also contact your provider using MyChart. We now offer e-Visits for anyone 44 and older to request care online for non-urgent symptoms. For details visit [email or web address].   Also download the MyChart app! Go to the app store, search "MyChart", open the app, select [place], and log in with your MyChart username and password.

## 2023-10-29 NOTE — Progress Notes (Signed)
Patient is here today for provider visit.  Per provider, she wants patient to get  her B-12 injection one week early because her levels are still low.  Patient will then resume every 28 days.  Injection given and patient tolerated without incidence.  She was discharged ambulatory and in stable condition. She will follow up as scheduled.

## 2023-11-12 DIAGNOSIS — D509 Iron deficiency anemia, unspecified: Secondary | ICD-10-CM | POA: Diagnosis not present

## 2023-11-12 DIAGNOSIS — E1122 Type 2 diabetes mellitus with diabetic chronic kidney disease: Secondary | ICD-10-CM | POA: Diagnosis not present

## 2023-11-12 DIAGNOSIS — I5020 Unspecified systolic (congestive) heart failure: Secondary | ICD-10-CM | POA: Diagnosis not present

## 2023-12-02 ENCOUNTER — Inpatient Hospital Stay: Payer: PPO

## 2023-12-03 ENCOUNTER — Inpatient Hospital Stay: Payer: PPO | Attending: Hematology

## 2023-12-03 VITALS — BP 138/73 | HR 71 | Temp 97.4°F | Resp 18

## 2023-12-03 DIAGNOSIS — D509 Iron deficiency anemia, unspecified: Secondary | ICD-10-CM

## 2023-12-03 DIAGNOSIS — E538 Deficiency of other specified B group vitamins: Secondary | ICD-10-CM | POA: Insufficient documentation

## 2023-12-03 MED ORDER — CYANOCOBALAMIN 1000 MCG/ML IJ SOLN
1000.0000 ug | Freq: Once | INTRAMUSCULAR | Status: AC
  Administered 2023-12-03: 1000 ug via INTRAMUSCULAR
  Filled 2023-12-03: qty 1

## 2023-12-03 NOTE — Patient Instructions (Signed)

## 2023-12-03 NOTE — Progress Notes (Signed)
 Erica Cervantes presents today for B12 injection per the provider's orders.  Stable during administration without incident; injection site WNL; see MAR for injection details.  Patient tolerated procedure well and without incident.  No questions or complaints noted at this time.

## 2023-12-08 ENCOUNTER — Ambulatory Visit (HOSPITAL_COMMUNITY)
Admission: RE | Admit: 2023-12-08 | Discharge: 2023-12-08 | Disposition: A | Discharge status: Home / Self Care | Payer: PPO | Source: Ambulatory Visit | Attending: Physician Assistant | Admitting: Physician Assistant

## 2023-12-08 VITALS — BP 142/90 | HR 74 | Wt 142.4 lb

## 2023-12-08 DIAGNOSIS — Z7984 Long term (current) use of oral hypoglycemic drugs: Secondary | ICD-10-CM | POA: Insufficient documentation

## 2023-12-08 DIAGNOSIS — I130 Hypertensive heart and chronic kidney disease with heart failure and stage 1 through stage 4 chronic kidney disease, or unspecified chronic kidney disease: Secondary | ICD-10-CM | POA: Diagnosis not present

## 2023-12-08 DIAGNOSIS — D649 Anemia, unspecified: Secondary | ICD-10-CM | POA: Diagnosis not present

## 2023-12-08 DIAGNOSIS — R9431 Abnormal electrocardiogram [ECG] [EKG]: Secondary | ICD-10-CM | POA: Diagnosis not present

## 2023-12-08 DIAGNOSIS — N184 Chronic kidney disease, stage 4 (severe): Secondary | ICD-10-CM | POA: Diagnosis not present

## 2023-12-08 DIAGNOSIS — M797 Fibromyalgia: Secondary | ICD-10-CM | POA: Insufficient documentation

## 2023-12-08 DIAGNOSIS — Z7901 Long term (current) use of anticoagulants: Secondary | ICD-10-CM | POA: Insufficient documentation

## 2023-12-08 DIAGNOSIS — I5022 Chronic systolic (congestive) heart failure: Secondary | ICD-10-CM

## 2023-12-08 DIAGNOSIS — K219 Gastro-esophageal reflux disease without esophagitis: Secondary | ICD-10-CM | POA: Diagnosis not present

## 2023-12-08 DIAGNOSIS — Z79899 Other long term (current) drug therapy: Secondary | ICD-10-CM | POA: Diagnosis not present

## 2023-12-08 DIAGNOSIS — D631 Anemia in chronic kidney disease: Secondary | ICD-10-CM | POA: Insufficient documentation

## 2023-12-08 DIAGNOSIS — I10 Essential (primary) hypertension: Secondary | ICD-10-CM | POA: Diagnosis not present

## 2023-12-08 DIAGNOSIS — I272 Pulmonary hypertension, unspecified: Secondary | ICD-10-CM | POA: Insufficient documentation

## 2023-12-08 DIAGNOSIS — I2510 Atherosclerotic heart disease of native coronary artery without angina pectoris: Secondary | ICD-10-CM | POA: Diagnosis not present

## 2023-12-08 DIAGNOSIS — G8929 Other chronic pain: Secondary | ICD-10-CM | POA: Diagnosis not present

## 2023-12-08 DIAGNOSIS — R41841 Cognitive communication deficit: Secondary | ICD-10-CM | POA: Insufficient documentation

## 2023-12-08 DIAGNOSIS — E1122 Type 2 diabetes mellitus with diabetic chronic kidney disease: Secondary | ICD-10-CM | POA: Diagnosis not present

## 2023-12-08 MED ORDER — SACUBITRIL-VALSARTAN 97-103 MG PO TABS: 1.0000 | ORAL_TABLET | Freq: Two times a day (BID) | ORAL | 3 refills | Status: DC

## 2023-12-08 NOTE — Progress Notes (Addendum)
 ADVANCED HF CLINIC NOTE  PCP: Doreatha Massed, MD HF Cardiologist: Dr. Gala Romney  HPI: Erica Cervantes is a 78 y.o. female with chronic systolic heart failure, DMII, CKD, HTN, cognitive deficits, GERD, chronic pain, and fibromyalgia.   She was admitted to [facility] 1/24 with SOB, fatigue and chest tightness. Echo showed EF 15% and she was transferred to [facility] for further workup. She was diuresed with IV lasix and underwent R/LHC showing mild CAD, severe biventricular heart failure with low-output. Started on IV lasix and milrinone. cMRI showed LVEF 17%, RVEF 23%, moderate MR and otherwise myocardial fibrosis, but no other findings of infiltrative disease.   Echo 5/24 EF 20-25%, RV mildly reduced, mild MR.    She was last seen for follow-up in 06/24. Volume stable. Entresto increased.   She is here today for CHF follow-up. Weight up 5 lb from last visit. She's noticed her abdomen and legs seem bigger but she isn't sure if this is due to fluid or just plain weight gain d/t better appetite and reduced activity level. Tends to have more joint pain in winter months and is less mobile. Notes some shortness of breath walking if her joints are hurting. Reports taking her medications as prescribed but does note a little trouble keeping up with things. 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   Family History  Some family cardiac history but she doesn't recall exactly what. Sister has a fib and her son has something cardiac related condition for which he's on meds.    Past Medical History:  Diagnosis Date   Chronic kidney disease    stage 4   Depression    Diabetes mellitus without complication (HCC)    GERD (gastroesophageal reflux disease)    Hypertension    Neuromuscular disorder (HCC)    peripheral neuropathy   Current Outpatient Medications  Medication Sig Dispense Refill   amphetamine-dextroamphetamine (ADDERALL) 20 MG tablet Take 20-40 mg by mouth  See admin instructions. Take 40 mg (2 tablets) by mouth in the morning and 20 mg (1 tablet) at 2 pm     aspirin EC 81 MG tablet Take 1 tablet (81 mg total) by mouth daily. Swallow whole. 30 tablet 12   atorvastatin (LIPITOR) 10 MG tablet Take 10 mg by mouth every Wednesday.     busPIRone (BUSPAR) 30 MG tablet Take 30 mg by mouth 3 (three) times daily.     Calcium Carb-Cholecalciferol (CALCIUM 500+D PO) Take 1 tablet by mouth daily.     cholecalciferol (VITAMIN D3) 25 MCG (1000 UNIT) tablet Take 2,000 Units by mouth daily.     Cyanocobalamin (B-12 COMPLIANCE INJECTION IJ) Inject as directed every 30 (thirty) days.     cycloSPORINE (RESTASIS) 0.05 % ophthalmic emulsion Place 2 drops into both eyes 2 (two) times daily.     digoxin (LANOXIN) 0.125 MG tablet Take 0.5 tablets (0.0625 mg total) by mouth daily. 15 tablet 0   donepezil (ARICEPT) 5 MG tablet Take 5 mg by mouth at bedtime.     DULoxetine (CYMBALTA) 60 MG capsule Take 120 mg by mouth daily at 12 noon.     folic acid (FOLVITE) 800 MCG tablet Take 800-2,400 mcg by mouth daily.     furosemide (LASIX) 20 MG tablet Take 1 tablet (20 mg total) by mouth daily. 90 tablet 4   Guaifenesin (MUCINEX MAXIMUM STRENGTH) 1200 MG TB12 Take 1,200 mg by mouth daily as needed (severe congestion).     metFORMIN (GLUCOPHAGE-XR) 500 MG  24 hr tablet Take 1,000 mg by mouth 2 (two) times daily.     metoprolol succinate (TOPROL XL) 25 MG 24 hr tablet Take 1 tablet (25 mg total) by mouth daily. 60 tablet 5   Multiple Minerals-Vitamins (CAL MAG ZINC +D3) TABS Take 1 tablet by mouth daily.     omeprazole (PRILOSEC) 20 MG capsule Take 20 mg by mouth daily.     Probiotic Product (PROBIOTIC PO) Take 1 capsule by mouth daily.     sacubitril-valsartan (ENTRESTO) 97-103 MG Take 1 tablet by mouth 2 (two) times daily. 60 tablet 3   spironolactone (ALDACTONE) 25 MG tablet Take 1 tablet (25 mg total) by mouth daily. 30 tablet 0   triamcinolone cream (KENALOG) 0.1 % Apply 1  application. topically 3 (three) times daily as needed for itching or rash.     No current facility-administered medications for this encounter.   Allergies  Allergen Reactions   Sulfa Antibiotics Shortness Of Breath   Ace Inhibitors Cough    Pt tolerates lisinopril    Codeine Itching and Swelling   Penicillins     Unknown reaction     Statins     Body aches    Tramadol     Numbness    Zetia [Ezetimibe]     Myalgia    Neosporin [Neomycin-Bacitracin Zn-Polymyx] Rash   Social History   Socioeconomic History   Marital status: Single    Spouse name: Not on file   Number of children: Not on file   Years of education: Not on file   Highest education level: Not on file  Occupational History   Not on file  Tobacco Use   Smoking status: Never    Passive exposure: Never   Smokeless tobacco: Never  Vaping Use   Vaping status: Never Used  Substance and Sexual Activity   Alcohol use: No   Drug use: No   Sexual activity: Not on file  Other Topics Concern   Not on file  Social History Narrative   Not on file   Social Drivers of Health   Financial Resource Strain: Not on file  Food Insecurity: No Food Insecurity (12/25/2022)   Hunger Vital Sign    Worried About Running Out of Food in the Last Year: Never true    Ran Out of Food in the Last Year: Never true  Transportation Needs: No Transportation Needs (12/25/2022)   PRAPARE - Administrator, Civil Service (Medical): No    Lack of Transportation (Non-Medical): No  Physical Activity: Not on file  Stress: Not on file  Social Connections: Not on file  Intimate Partner Violence: Not At Risk (12/25/2022)   Humiliation, Afraid, Rape, and Kick questionnaire    Fear of Current or Ex-Partner: No    Emotionally Abused: No    Physically Abused: No    Sexually Abused: No   Family History  Problem Relation Age of Onset   Cancer Mother    Cancer Father    Heart attack Brother    Polycystic kidney disease Brother     Cancer Other    BP (!) 142/90 (BP Location: Left Arm, Patient Position: Sitting, Cuff Size: Normal)   Pulse 74   Wt 64.6 kg (142 lb 6.4 oz)   SpO2 99%   BMI 25.23 kg/m   Wt Readings from Last 3 Encounters:  12/08/23 64.6 kg (142 lb 6.4 oz)  10/29/23 63.6 kg (140 lb 4.8 oz)  07/07/23 63.5 kg (140 lb)  PHYSICAL EXAM: General:  Chronically ill appearing elderly female. Neck: no JVD.  Cor: Regular rate & rhythm. No rubs, gallops or murmurs. Lungs: clear Abdomen: soft, nontender, nondistended.  Extremities: no edema Neuro: alert & orientedx3. Affect pleasant   ASSESSMENT & PLAN: 1. Chronic Systolic HF  - Echo at [facility] (1/24):  EF 15%, LV mod-sev dilated, LA mildly dilated, RV function normal, mod pulmonary hypertension, mod MR - R/LHC (1/24): mild CAD, biventricular failure and cardiogenic shock - cMRI (1/24): LVEF 17% RV EF 23%. Extracellular volume 36%. + myocardial fibrosis but no other findings of infiltrative disease  - Echo (5/24): EF 20-25%, RV mildly reduced, mild MR - Etiology of CM unclear. Not candidate for advanced therapies with age and comorbidities - Will continue w/ medical management and GDMT optimization  - NYHA II/early III confounded by joint pain. Weight up 5 lb but volume looks good. Prescribed 20 mg lasix daily, taking 40 mg daily. Would continue current dose. - Increase Entresto to 97/103 mg BID - Remain off Farxiga due to GU symptoms - Continue spiro 25 mg daily - Continue Toprol XL 25 mg daily. - Continue digoxin 0.0625 mg daily.  - Given lab slip to get BMET, BNP and digoxin level checked in [place] in 1 week. Given instruction to hold digoxin morning of lab. - Worry somewhat about cognitive impairment and medication adherence. She was gifted a pill box to help her organize daily medications. - Due for echo to reassess LV function, this was ordered today   2. CAD - mild, nonobstructive CAD by cath (1/24) - No chest pain - Continue 81 mg  aspirin + statin  3. DM II - Managed by PCP - Now off SGLT2i given GU symptoms - Remains on Metformin   4. Chronic anemia - Iron and B12 deficient  - She is followed by Hematology. Last office note from 01/25 reviewed. Receiving Vitamin B12 injections monthly. She received IV iron in 03/24 but has not required IV iron since. Last hemoglobin stable at 13.  - FOBT negative X 3  5. HTN - BP elevated today - Reports this is due to stress on the drive here but has been elevated at other appointments as well.  - Adderall may contribute to hypertension. She states she cannot function without the medication. - Increase Entresto to 97/103 mg BID   Follow-up: 4 months with APP  [facility], Chenika Nevils N, PA-C 12/08/2023

## 2023-12-08 NOTE — Addendum Note (Signed)
 Encounter addended by: Andrey Farmer, PA-C on: 12/08/2023 4:08 PM  Actions taken: Clinical Note Signed

## 2023-12-08 NOTE — Patient Instructions (Addendum)
 Medication Changes:  No Changes In Medications at this time.   Lab Work:  PLEASE GO TO [facility] IN [place] TO HAVE YOUR LABS DRAWN IN 1 WEEK  DO NOT TAKE DIGOXIN THE MORNING OF YOUR BLOOD WORK   PLEASE REFER TO [facility] FOR BLOOD WORK/URINE TESTING: LOCATIONS LISTED BELOW   Address: [address], [place] [address] Hours:  Open ? Closes 12:30?PM ? Reopens 1:30?PM Updated by this business 7 weeks ago Phone: [phone] Appointments: [email or web address]  Address: [address] [place], [place] [address] Hours:  Open ? Closes 12?PM ? Reopens 1?PM Updated by this business 7 weeks ago Phone: [phone] Appointments: [email or web address]   Testing/Procedures:  ECHOCARDIOGRAM AS SCHEDULED   Follow-Up in: 4 MONTHS AS SCHEDULED   At the [facility], you and your health needs are our priority. We have a designated team specialized in the treatment of Heart Failure. This Care Team includes your primary Heart Failure Specialized Cardiologist (physician), Advanced Practice Providers (APPs- Physician Assistants and Nurse Practitioners), and Pharmacist who all work together to provide you with the care you need, when you need it.   You may see any of the following providers on your designated Care Team at your next follow up:  Dr. Arvilla Meres Dr. Marca Ancona Dr. Dorthula Nettles Dr. Theresia Bough Tonye Becket, NP Robbie Lis, [place] [facility] [place], [place] Brynda Peon, NP [place] Lee, NP Karle Plumber, PharmD   Please be sure to bring in all your medications bottles to every appointment.   Need to Contact us:  If you have any questions or concerns before your next appointment please send [place] a message through [place] or call our office at [phone].    TO LEAVE A MESSAGE FOR THE NURSE SELECT OPTION 2, PLEASE LEAVE A MESSAGE INCLUDING: YOUR NAME DATE OF BIRTH CALL BACK NUMBER REASON FOR CALL**this is important as we prioritize the call  backs  YOU WILL RECEIVE A CALL BACK THE SAME DAY AS LONG AS YOU CALL BEFORE 4:00 PM

## 2023-12-09 ENCOUNTER — Inpatient Hospital Stay: Payer: PPO

## 2023-12-10 DIAGNOSIS — I5020 Unspecified systolic (congestive) heart failure: Secondary | ICD-10-CM | POA: Diagnosis not present

## 2023-12-10 DIAGNOSIS — D509 Iron deficiency anemia, unspecified: Secondary | ICD-10-CM | POA: Diagnosis not present

## 2023-12-10 DIAGNOSIS — E1122 Type 2 diabetes mellitus with diabetic chronic kidney disease: Secondary | ICD-10-CM | POA: Diagnosis not present

## 2023-12-13 ENCOUNTER — Other Ambulatory Visit (HOSPITAL_COMMUNITY): Payer: Self-pay | Admitting: Physician Assistant

## 2023-12-13 DIAGNOSIS — I5022 Chronic systolic (congestive) heart failure: Secondary | ICD-10-CM | POA: Diagnosis not present

## 2023-12-16 LAB — BASIC METABOLIC PANEL
BUN/Creatinine Ratio: 13 (ref 12–28)
BUN: 16 mg/dL (ref 8–27)
CO2: 25 mmol/L (ref 20–29)
Calcium: 9.9 mg/dL (ref 8.7–10.3)
Chloride: 98 mmol/L (ref 96–106)
Creatinine, Ser: 1.28 mg/dL — ABNORMAL HIGH (ref 0.57–1.00)
Glucose: 193 mg/dL — ABNORMAL HIGH (ref 70–99)
Potassium: 4.3 mmol/L (ref 3.5–5.2)
Sodium: 140 mmol/L (ref 134–144)
eGFR: 43 mL/min/{1.73_m2} — ABNORMAL LOW (ref >59)

## 2023-12-16 LAB — DIGOXIN LEVEL: Digoxin, Serum: 0.5 ng/mL (ref 0.5–0.9)

## 2023-12-16 LAB — BRAIN NATRIURETIC PEPTIDE: BNP: 37.2 pg/mL (ref 0.0–100.0)

## 2023-12-17 ENCOUNTER — Telehealth (HOSPITAL_COMMUNITY): Payer: Self-pay | Admitting: Cardiology

## 2023-12-17 NOTE — Telephone Encounter (Signed)
 Patient called to report decrease in UOP the past two days. Pt felt its related to increasing entresto  No weights to report Entresto 97/103 BID Lasix 40 mg daily  Denies dizziness, light hea

## 2023-12-24 ENCOUNTER — Ambulatory Visit (HOSPITAL_COMMUNITY)
Admission: RE | Admit: 2023-12-24 | Discharge: 2023-12-24 | Disposition: A | Discharge status: Home / Self Care | Payer: PPO | Source: Ambulatory Visit | Attending: Physician Assistant | Admitting: Physician Assistant

## 2023-12-24 DIAGNOSIS — I5022 Chronic systolic (congestive) heart failure: Secondary | ICD-10-CM | POA: Diagnosis not present

## 2023-12-24 DIAGNOSIS — E119 Type 2 diabetes mellitus without complications: Secondary | ICD-10-CM | POA: Diagnosis not present

## 2023-12-24 DIAGNOSIS — Z006 Encounter for examination for normal comparison and control in clinical research program: Secondary | ICD-10-CM

## 2023-12-24 LAB — ECHOCARDIOGRAM COMPLETE
AR max vel: 2.07 cm2
AV Peak grad: 5.4 mmHg
Ao pk vel: 1.16 m/s
Area-P 1/2: 3.99 cm2
Calc EF: 44.5 %
MV M vel: 3.83 m/s
MV Peak grad: 58.7 mmHg
S' Lateral: 3.9 cm
Single Plane A2C EF: 45.3 %
Single Plane A4C EF: 41.3 %

## 2023-12-24 NOTE — Research (Signed)
 SITE: [id number]     Subject # [id number]  Subprotocol: A  Inclusion Criteria  Patients who meet all of the following criteria are eligible for enrollment as study participants:  Yes No  Age > 80 years old X   Eligible to wear Holter Study X    Exclusion Criteria  Patients who meet any of these criteria are not eligible for enrollment as study participants: Yes No  1. Receiving any mechanical (respiratory or circulatory) or renal support therapy at Screening or during Visit #1.  X  2.  Any other conditions that in the opinion of the investigators are likely to prevent compliance with the study protocol or pose a safety concern if the subject participates in the study.  X  3. Poor tolerance, namely susceptible to severe skin allergies from ECG adhesive patch application.  X   Protocol: REV H                                     Residential Zip code [address] (First 3 digits ONLY)                                             [employer] Informed Consent   Subject Name: Erica Cervantes  Subject met inclusion and exclusion criteria.  The informed consent form, study requirements and expectations were reviewed with the subject. Subject had opportunity to read consent and questions and concerns were addressed prior to the signing of the consent form.  The subject verbalized understanding of the trial requirements.  The subject agreed to participate in the [employer] EF ACT trial and signed the informed consent at 13:55 on 24-Dec-2023.  The informed consent was obtained prior to performance of any protocol-specific procedures for the subject.  A copy of the signed informed consent was given to the subject and a copy was placed in the subject's medical record.   Dyanne Iha          Current Outpatient Medications:    amphetamine-dextroamphetamine (ADDERALL) 20 MG tablet, Take 20-40 mg by mouth See admin instructions. Take 40 mg (2 tablets) by mouth in the morning and 20 mg (1 tablet) at 2 pm, Disp: , Rfl:     aspirin EC 81 MG tablet, Take 1 tablet (81 mg total) by mouth daily. Swallow whole., Disp: 30 tablet, Rfl: 12   atorvastatin (LIPITOR) 10 MG tablet, Take 10 mg by mouth every Wednesday., Disp: , Rfl:    busPIRone (BUSPAR) 30 MG tablet, Take 30 mg by mouth 3 (three) times daily., Disp: , Rfl:    Calcium Carb-Cholecalciferol (CALCIUM 500+D PO), Take 1 tablet by mouth daily., Disp: , Rfl:    cholecalciferol (VITAMIN D3) 25 MCG (1000 UNIT) tablet, Take 2,000 Units by mouth daily., Disp: , Rfl:    Cyanocobalamin (B-12 COMPLIANCE INJECTION IJ), Inject as directed every 30 (thirty) days., Disp: , Rfl:    cycloSPORINE (RESTASIS) 0.05 % ophthalmic emulsion, Place 2 drops into both eyes 2 (two) times daily., Disp: , Rfl:    digoxin (LANOXIN) 0.125 MG tablet, Take 0.5 tablets (0.0625 mg total) by mouth daily., Disp: 15 tablet, Rfl: 0   donepezil (ARICEPT) 5 MG tablet, Take 5 mg by mouth at bedtime., Disp: , Rfl:    DULoxetine (CYMBALTA) 60 MG capsule, Take 120  mg by mouth daily at 12 noon., Disp: , Rfl:    folic acid (FOLVITE) 800 MCG tablet, Take 800-2,400 mcg by mouth daily., Disp: , Rfl:    furosemide (LASIX) 20 MG tablet, Take 1 tablet (20 mg total) by mouth daily., Disp: 90 tablet, Rfl: 4   Guaifenesin (MUCINEX MAXIMUM STRENGTH) 1200 MG TB12, Take 1,200 mg by mouth daily as needed (severe congestion)., Disp: , Rfl:    metFORMIN (GLUCOPHAGE-XR) 500 MG 24 hr tablet, Take 1,000 mg by mouth 2 (two) times daily., Disp: , Rfl:    metoprolol succinate (TOPROL XL) 25 MG 24 hr tablet, Take 1 tablet (25 mg total) by mouth daily., Disp: 60 tablet, Rfl: 5   Multiple Minerals-Vitamins (CAL MAG ZINC +D3) TABS, Take 1 tablet by mouth daily., Disp: , Rfl:    omeprazole (PRILOSEC) 20 MG capsule, Take 20 mg by mouth daily., Disp: , Rfl:    Probiotic Product (PROBIOTIC PO), Take 1 capsule by mouth daily., Disp: , Rfl:    sacubitril-valsartan (ENTRESTO) 97-103 MG, Take 1 tablet by mouth 2 (two) times daily., Disp: 60  tablet, Rfl: 3   spironolactone (ALDACTONE) 25 MG tablet, Take 1 tablet (25 mg total) by mouth daily., Disp: 30 tablet, Rfl: 0   triamcinolone cream (KENALOG) 0.1 %, Apply 1 application. topically 3 (three) times daily as needed for itching or rash., Disp: , Rfl:

## 2023-12-24 NOTE — Progress Notes (Signed)
 Echocardiogram 2D Echocardiogram has been performed.  Erica Cervantes 12/24/2023, 1:45 PM

## 2023-12-28 DIAGNOSIS — E559 Vitamin D deficiency, unspecified: Secondary | ICD-10-CM | POA: Diagnosis not present

## 2023-12-28 DIAGNOSIS — F900 Attention-deficit hyperactivity disorder, predominantly inattentive type: Secondary | ICD-10-CM | POA: Diagnosis not present

## 2023-12-28 DIAGNOSIS — E7849 Other hyperlipidemia: Secondary | ICD-10-CM | POA: Diagnosis not present

## 2023-12-28 DIAGNOSIS — E1165 Type 2 diabetes mellitus with hyperglycemia: Secondary | ICD-10-CM | POA: Diagnosis not present

## 2023-12-28 DIAGNOSIS — N1831 Chronic kidney disease, stage 3a: Secondary | ICD-10-CM | POA: Diagnosis not present

## 2023-12-29 ENCOUNTER — Other Ambulatory Visit (HOSPITAL_COMMUNITY): Payer: Self-pay | Admitting: Physician Assistant

## 2023-12-30 ENCOUNTER — Inpatient Hospital Stay: Payer: PPO | Attending: Hematology

## 2023-12-30 VITALS — BP 154/79 | HR 65 | Temp 97.2°F | Resp 18

## 2023-12-30 DIAGNOSIS — E538 Deficiency of other specified B group vitamins: Secondary | ICD-10-CM | POA: Insufficient documentation

## 2023-12-30 DIAGNOSIS — D509 Iron deficiency anemia, unspecified: Secondary | ICD-10-CM

## 2023-12-30 MED ORDER — CYANOCOBALAMIN 1000 MCG/ML IJ SOLN
1000.0000 ug | Freq: Once | INTRAMUSCULAR | Status: AC
Start: 2023-12-30 — End: 2023-12-30
  Administered 2023-12-30: 1000 ug via INTRAMUSCULAR
  Filled 2023-12-30: qty 1

## 2023-12-30 NOTE — Patient Instructions (Signed)
 [facility] [place] [facility]  Discharge Instructions: Thank you for choosing [facility] to provide your oncology and hematology care.  If you have a lab appointment with the Cancer Center - please note that after April 8th, 2024, all labs will be drawn in the cancer center.  You do not have to check in or register with the main entrance as you have in the past but will complete your check-in in the cancer center.  Wear comfortable clothing and clothing appropriate for easy access to any Portacath or PICC line.   We strive to give you quality time with your provider. You may need to reschedule your appointment if you arrive late (15 or more minutes).  Arriving late affects you and other patients whose appointments are after yours.  Also, if you miss three or more appointments without notifying the office, you may be dismissed from the clinic at the provider's discretion.      For prescription refill requests, have your pharmacy contact our office and allow 72 hours for refills to be completed.    Today you received B12 injection     BELOW ARE SYMPTOMS THAT SHOULD BE REPORTED IMMEDIATELY: *FEVER GREATER THAN 100.4 F (38 C) OR HIGHER *CHILLS OR SWEATING *NAUSEA AND VOMITING THAT IS NOT CONTROLLED WITH YOUR NAUSEA MEDICATION *UNUSUAL SHORTNESS OF BREATH *UNUSUAL BRUISING OR BLEEDING *URINARY PROBLEMS (pain or burning when urinating, or frequent urination) *BOWEL PROBLEMS (unusual diarrhea, constipation, pain near the anus) TENDERNESS IN MOUTH AND THROAT WITH OR WITHOUT PRESENCE OF ULCERS (sore throat, sores in mouth, or a toothache) UNUSUAL RASH, SWELLING OR PAIN  UNUSUAL VAGINAL DISCHARGE OR ITCHING   Items with * indicate a potential emergency and should be followed up as soon as possible or go to the Emergency Department if any problems should occur.  Please show the CHEMOTHERAPY ALERT CARD or IMMUNOTHERAPY ALERT CARD at check-in to  the Emergency Department and triage nurse.  Should you have questions after your visit or need to cancel or reschedule your appointment, please contact [facility] [place] - A DEPT OF [facility]ide[facility] [phone]  and follow the prompts.  Office hours are 8:00 a.m. to 4:30 p.m. Monday - Friday. Please note that voicemails left after 4:00 p.m. may not be returned until the following business day.  We are closed weekends and major holidays. You have access to a nurse at all times for urgent questions. Please call the main number to the clinic [phone] and follow the prompts.  For any non-urgent questions, you may also contact your provider using MyChart. We now offer e-Visits for anyone 57 and older to request care online for non-urgent symptoms. For details visit [email or web address].   Also download the MyChart app! Go to the app store, search "MyChart", open the app, select [place], and log in with your MyChart username and password.

## 2023-12-30 NOTE — Progress Notes (Signed)
Erica Cervantes presents today for injection per the provider's orders.  B12  administration without incident; injection site WNL; see MAR for injection details.  Patient tolerated procedure well and without incident.  No questions or complaints noted at this time. Discharged from clinic ambulatory in stable condition. Alert and oriented x 3. F/U with [facility] as scheduled.

## 2024-01-04 DIAGNOSIS — Z6826 Body mass index (BMI) 26.0-26.9, adult: Secondary | ICD-10-CM | POA: Diagnosis not present

## 2024-01-04 DIAGNOSIS — M797 Fibromyalgia: Secondary | ICD-10-CM | POA: Diagnosis not present

## 2024-01-04 DIAGNOSIS — E1122 Type 2 diabetes mellitus with diabetic chronic kidney disease: Secondary | ICD-10-CM | POA: Diagnosis not present

## 2024-01-04 DIAGNOSIS — N1831 Chronic kidney disease, stage 3a: Secondary | ICD-10-CM | POA: Diagnosis not present

## 2024-01-04 DIAGNOSIS — I10 Essential (primary) hypertension: Secondary | ICD-10-CM | POA: Diagnosis not present

## 2024-01-06 ENCOUNTER — Inpatient Hospital Stay: Payer: PPO

## 2024-01-10 DIAGNOSIS — D509 Iron deficiency anemia, unspecified: Secondary | ICD-10-CM | POA: Diagnosis not present

## 2024-01-10 DIAGNOSIS — I5020 Unspecified systolic (congestive) heart failure: Secondary | ICD-10-CM | POA: Diagnosis not present

## 2024-01-10 DIAGNOSIS — E1122 Type 2 diabetes mellitus with diabetic chronic kidney disease: Secondary | ICD-10-CM | POA: Diagnosis not present

## 2024-01-17 DIAGNOSIS — Z20822 Contact with and (suspected) exposure to covid-19: Secondary | ICD-10-CM | POA: Diagnosis not present

## 2024-01-17 DIAGNOSIS — Z6825 Body mass index (BMI) 25.0-25.9, adult: Secondary | ICD-10-CM | POA: Diagnosis not present

## 2024-01-17 DIAGNOSIS — J209 Acute bronchitis, unspecified: Secondary | ICD-10-CM | POA: Diagnosis not present

## 2024-01-17 DIAGNOSIS — J019 Acute sinusitis, unspecified: Secondary | ICD-10-CM | POA: Diagnosis not present

## 2024-01-24 DIAGNOSIS — Z6825 Body mass index (BMI) 25.0-25.9, adult: Secondary | ICD-10-CM | POA: Diagnosis not present

## 2024-01-24 DIAGNOSIS — E1122 Type 2 diabetes mellitus with diabetic chronic kidney disease: Secondary | ICD-10-CM | POA: Diagnosis not present

## 2024-01-24 DIAGNOSIS — J019 Acute sinusitis, unspecified: Secondary | ICD-10-CM | POA: Diagnosis not present

## 2024-01-31 ENCOUNTER — Inpatient Hospital Stay: Payer: PPO | Attending: Hematology

## 2024-01-31 ENCOUNTER — Inpatient Hospital Stay: Payer: PPO

## 2024-01-31 VITALS — BP 126/83 | HR 70 | Temp 97.9°F | Resp 18

## 2024-01-31 DIAGNOSIS — D509 Iron deficiency anemia, unspecified: Secondary | ICD-10-CM | POA: Diagnosis not present

## 2024-01-31 DIAGNOSIS — E538 Deficiency of other specified B group vitamins: Secondary | ICD-10-CM | POA: Insufficient documentation

## 2024-01-31 LAB — FERRITIN: Ferritin: 125 ng/mL (ref 11–307)

## 2024-01-31 LAB — CBC WITH DIFFERENTIAL/PLATELET
Abs Immature Granulocytes: 0.08 10*3/uL — ABNORMAL HIGH (ref 0.00–0.07)
Basophils Absolute: 0.1 10*3/uL (ref 0.0–0.1)
Basophils Relative: 1 %
Eosinophils Absolute: 0.3 10*3/uL (ref 0.0–0.5)
Eosinophils Relative: 4 %
HCT: 42.5 % (ref 36.0–46.0)
Hemoglobin: 13.2 g/dL (ref 12.0–15.0)
Immature Granulocytes: 1 %
Lymphocytes Relative: 27 %
Lymphs Abs: 2.6 10*3/uL (ref 0.7–4.0)
MCH: 29.7 pg (ref 26.0–34.0)
MCHC: 31.1 g/dL (ref 30.0–36.0)
MCV: 95.7 fL (ref 80.0–100.0)
Monocytes Absolute: 0.7 10*3/uL (ref 0.1–1.0)
Monocytes Relative: 8 %
Neutro Abs: 5.7 10*3/uL (ref 1.7–7.7)
Neutrophils Relative %: 59 %
Platelets: 308 10*3/uL (ref 150–400)
RBC: 4.44 MIL/uL (ref 3.87–5.11)
RDW: 13.3 % (ref 11.5–15.5)
WBC: 9.5 10*3/uL (ref 4.0–10.5)
nRBC: 0 % (ref 0.0–0.2)

## 2024-01-31 LAB — VITAMIN B12: Vitamin B-12: 3810 pg/mL — ABNORMAL HIGH (ref 180–914)

## 2024-01-31 LAB — IRON AND TIBC
Iron: 105 ug/dL (ref 28–170)
Saturation Ratios: 33 % — ABNORMAL HIGH (ref 10.4–31.8)
TIBC: 319 ug/dL (ref 250–450)
UIBC: 214 ug/dL

## 2024-01-31 LAB — FOLATE: Folate: >40 ng/mL (ref >5.9)

## 2024-01-31 MED ORDER — CYANOCOBALAMIN 1000 MCG/ML IJ SOLN
1000.0000 ug | Freq: Once | INTRAMUSCULAR | Status: AC
  Administered 2024-01-31: 1000 ug via INTRAMUSCULAR
  Filled 2024-01-31: qty 1

## 2024-01-31 NOTE — Progress Notes (Signed)
 B12 injection  given per orders. Patient tolerated it well without problems. Vitals stable and discharged home from clinic ambulatory. Follow up as scheduled.

## 2024-01-31 NOTE — Patient Instructions (Signed)
 [facility] [place] [facility]  Discharge Instructions: Thank you for choosing [facility] to provide your oncology and hematology care.  If you have a lab appointment with the Cancer Center - please note that after April 8th, 2024, all labs will be drawn in the cancer center.  You do not have to check in or register with the main entrance as you have in the past but will complete your check-in in the cancer center.  Wear comfortable clothing and clothing appropriate for easy access to any Portacath or PICC line.   We strive to give you quality time with your provider. You may need to reschedule your appointment if you arrive late (15 or more minutes).  Arriving late affects you and other patients whose appointments are after yours.  Also, if you miss three or more appointments without notifying the office, you may be dismissed from the clinic at the provider's discretion.      For prescription refill requests, have your pharmacy contact our office and allow 72 hours for refills to be completed.    Today you received the following injection: B12   To help prevent nausea and vomiting after your treatment, we encourage you to take your nausea medication as directed.  BELOW ARE SYMPTOMS THAT SHOULD BE REPORTED IMMEDIATELY: *FEVER GREATER THAN 100.4 F (38 C) OR HIGHER *CHILLS OR SWEATING *NAUSEA AND VOMITING THAT IS NOT CONTROLLED WITH YOUR NAUSEA MEDICATION *UNUSUAL SHORTNESS OF BREATH *UNUSUAL BRUISING OR BLEEDING *URINARY PROBLEMS (pain or burning when urinating, or frequent urination) *BOWEL PROBLEMS (unusual diarrhea, constipation, pain near the anus) TENDERNESS IN MOUTH AND THROAT WITH OR WITHOUT PRESENCE OF ULCERS (sore throat, sores in mouth, or a toothache) UNUSUAL RASH, SWELLING OR PAIN  UNUSUAL VAGINAL DISCHARGE OR ITCHING   Items with * indicate a potential emergency and should be followed up as soon as possible or go to the  Emergency Department if any problems should occur.  Please show the CHEMOTHERAPY ALERT CARD or IMMUNOTHERAPY ALERT CARD at check-in to the Emergency Department and triage nurse.  Should you have questions after your visit or need to cancel or reschedule your appointment, please contact [facility] [place] - A DEPT OF [facility] [phone]  and follow the prompts.  Office hours are 8:00 a.m. to 4:30 p.m. Monday - Friday. Please note that voicemails left after 4:00 p.m. may not be returned until the following business day.  We are closed weekends and major holidays. You have access to a nurse at all times for urgent questions. Please call the main number to the clinic [phone] and follow the prompts.  For any non-urgent questions, you may also contact your provider using MyChart. We now offer e-Visits for anyone 73 and older to request care online for non-urgent symptoms. For details visit [email or web address].   Also download the MyChart app! Go to the app store, search "MyChart", open the app, select [place], and log in with your MyChart username and password.

## 2024-02-02 LAB — METHYLMALONIC ACID, SERUM: Methylmalonic Acid, Quantitative: 225 nmol/L (ref 0–378)

## 2024-02-07 ENCOUNTER — Inpatient Hospital Stay: Payer: PPO

## 2024-02-07 ENCOUNTER — Other Ambulatory Visit: Payer: PPO

## 2024-02-09 DIAGNOSIS — D509 Iron deficiency anemia, unspecified: Secondary | ICD-10-CM | POA: Diagnosis not present

## 2024-02-09 DIAGNOSIS — I5020 Unspecified systolic (congestive) heart failure: Secondary | ICD-10-CM | POA: Diagnosis not present

## 2024-02-09 DIAGNOSIS — E1122 Type 2 diabetes mellitus with diabetic chronic kidney disease: Secondary | ICD-10-CM | POA: Diagnosis not present

## 2024-03-01 ENCOUNTER — Inpatient Hospital Stay: Payer: PPO

## 2024-03-02 DIAGNOSIS — L718 Other rosacea: Secondary | ICD-10-CM | POA: Diagnosis not present

## 2024-03-02 DIAGNOSIS — L281 Prurigo nodularis: Secondary | ICD-10-CM | POA: Diagnosis not present

## 2024-03-02 DIAGNOSIS — I872 Venous insufficiency (chronic) (peripheral): Secondary | ICD-10-CM | POA: Diagnosis not present

## 2024-03-08 ENCOUNTER — Inpatient Hospital Stay: Payer: PPO

## 2024-03-09 DIAGNOSIS — H02132 Senile ectropion of right lower eyelid: Secondary | ICD-10-CM | POA: Diagnosis not present

## 2024-03-09 DIAGNOSIS — H02832 Dermatochalasis of right lower eyelid: Secondary | ICD-10-CM | POA: Diagnosis not present

## 2024-03-09 DIAGNOSIS — H189 Unspecified disorder of cornea: Secondary | ICD-10-CM | POA: Diagnosis not present

## 2024-03-09 DIAGNOSIS — H02134 Senile ectropion of left upper eyelid: Secondary | ICD-10-CM | POA: Diagnosis not present

## 2024-03-10 DIAGNOSIS — I5020 Unspecified systolic (congestive) heart failure: Secondary | ICD-10-CM | POA: Diagnosis not present

## 2024-03-10 DIAGNOSIS — D509 Iron deficiency anemia, unspecified: Secondary | ICD-10-CM | POA: Diagnosis not present

## 2024-03-10 DIAGNOSIS — E1122 Type 2 diabetes mellitus with diabetic chronic kidney disease: Secondary | ICD-10-CM | POA: Diagnosis not present

## 2024-03-13 DIAGNOSIS — M542 Cervicalgia: Secondary | ICD-10-CM | POA: Diagnosis not present

## 2024-03-13 DIAGNOSIS — M25551 Pain in right hip: Secondary | ICD-10-CM | POA: Diagnosis not present

## 2024-03-13 DIAGNOSIS — Z13828 Encounter for screening for other musculoskeletal disorder: Secondary | ICD-10-CM | POA: Diagnosis not present

## 2024-03-22 DIAGNOSIS — M542 Cervicalgia: Secondary | ICD-10-CM | POA: Diagnosis not present

## 2024-03-22 DIAGNOSIS — M25511 Pain in right shoulder: Secondary | ICD-10-CM | POA: Diagnosis not present

## 2024-03-22 DIAGNOSIS — M25512 Pain in left shoulder: Secondary | ICD-10-CM | POA: Diagnosis not present

## 2024-03-22 DIAGNOSIS — M791 Myalgia, unspecified site: Secondary | ICD-10-CM | POA: Diagnosis not present

## 2024-03-31 ENCOUNTER — Inpatient Hospital Stay: Payer: PPO

## 2024-04-05 ENCOUNTER — Telehealth (HOSPITAL_COMMUNITY): Payer: Self-pay

## 2024-04-05 NOTE — Telephone Encounter (Signed)
 Called to confirm/remind patient of their appointment at the [facility] on 04/06/24.   Appointment:   [x] Confirmed  [] Left mess   [] No answer/No voice mail  [] VM Full/unable to leave message  [] Phone not in service  Patient reminded to bring all medications and/or complete list.  Confirmed patient has transportation. Gave directions, instructed to utilize valet parking.

## 2024-04-05 NOTE — Progress Notes (Signed)
 ADVANCED HF CLINIC NOTE  PCP: Rogers Hai, MD HF Cardiologist: Dr. Cherrie Reason for Visit: Heart Failure Follow-up  HPI: Erica Cervantes is a 78 y.o. female with chronic systolic heart failure, DMII, CKD, HTN, cognitive deficits, GERD, chronic pain, and fibromyalgia.   She was admitted to [facility] 1/24 with SOB, fatigue, and chest tightness. Echo showed EF 15% and she was transferred to [facility] for further workup. She was diuresed with IV lasix  and underwent R/LHC showing mild CAD, severe biventricular heart failure with low-output. Started on IV lasix  and milrinone . cMRI showed LVEF 17%, RVEF 23%, moderate MR and otherwise myocardial fibrosis, but no other findings of infiltrative disease.   Echo 5/24 EF 20-25%, RV mildly reduced, mild MR.    Repeat echo 3/25 EF 40-45%, G1DD, low-nl RV, triv MR  She returns today for heart failure follow up with her daughter. Overall feeling well. NYHA II. Has been having significant swings in her blood sugar, from 300 to 80 throughout the day. Had previously been on Metformin for 10 years, however stopped recently d/t diarrhea. She has not started on Glipizide and finds that she has to eat frequently throughout the day to keep her blood sugars up. Denies chest pain, dyspnea, fatigue, near-syncope, orthopnea, palpitations, dizziness, and abnormal bleeding. Able to perform ADLs. Appetite okay. Has been gaining some weight, she feels that this is because she has been eating more frequently. Compliant with all medications.  Family History: Some family cardiac history but she doesn't recall exactly what. Sister has a fib and her son has something cardiac related condition for which he's on meds.   Past Medical History:  Diagnosis Date   Chronic kidney disease    stage 4   Depression    Diabetes mellitus without complication (HCC)    GERD (gastroesophageal reflux disease)    Hypertension    Neuromuscular disorder (HCC)    peripheral neuropathy    Current Outpatient Medications  Medication Sig Dispense Refill   amphetamine-dextroamphetamine (ADDERALL) 20 MG tablet Take 20-40 mg by mouth See admin instructions. Take 40 mg (2 tablets) by mouth in the morning and 20 mg (1 tablet) at 2 pm     atorvastatin  (LIPITOR) 10 MG tablet Take 10 mg by mouth every Wednesday.     busPIRone  (BUSPAR ) 30 MG tablet Take 30 mg by mouth 3 (three) times daily.     Calcium  Carb-Cholecalciferol (CALCIUM  500+D PO) Take 1 tablet by mouth daily.     cholecalciferol (VITAMIN D3) 25 MCG (1000 UNIT) tablet Take 2,000 Units by mouth daily.     cycloSPORINE (RESTASIS) 0.05 % ophthalmic emulsion Place 2 drops into both eyes 2 (two) times daily.     donepezil  (ARICEPT ) 5 MG tablet Take 5 mg by mouth at bedtime.     DULoxetine  (CYMBALTA ) 60 MG capsule Take 120 mg by mouth daily at 12 noon.     folic acid  (FOLVITE ) 800 MCG tablet Take 800-2,400 mcg by mouth daily.     glipiZIDE (GLUCOTROL XL) 10 MG 24 hr tablet Take 10 mg by mouth daily.     Guaifenesin (MUCINEX MAXIMUM STRENGTH) 1200 MG TB12 Take 1,200 mg by mouth daily as needed (severe congestion).     metoprolol  succinate (TOPROL  XL) 25 MG 24 hr tablet Take 1 tablet (25 mg total) by mouth daily. 60 tablet 5   Multiple Minerals-Vitamins (CAL MAG ZINC +D3) TABS Take 1 tablet by mouth daily.     omeprazole (PRILOSEC) 20 MG capsule Take 20 mg  by mouth daily.     Probiotic Product (PROBIOTIC PO) Take 1 capsule by mouth daily.     sacubitril -valsartan  (ENTRESTO ) 97-103 MG Take 1 tablet by mouth 2 (two) times daily. 60 tablet 3   spironolactone  (ALDACTONE ) 25 MG tablet Take 1 tablet (25 mg total) by mouth daily. 30 tablet 0   triamcinolone  cream (KENALOG ) 0.1 % Apply 1 application. topically 3 (three) times daily as needed for itching or rash.     aspirin  EC 81 MG tablet Take 1 tablet (81 mg total) by mouth daily. Swallow whole. 30 tablet 12   digoxin  (LANOXIN ) 0.125 MG tablet Take 0.5 tablets (0.0625 mg total) by mouth  daily. 15 tablet 0   furosemide  (LASIX ) 20 MG tablet Take 1 tablet (20 mg total) by mouth daily. 90 tablet 4   No current facility-administered medications for this encounter.   Allergies  Allergen Reactions   Sulfa Antibiotics Shortness Of Breath   Ace Inhibitors Cough    Pt tolerates lisinopril    Codeine Itching and Swelling   Penicillins     Unknown reaction     Statins     Body aches    Tramadol     Numbness    Zetia [Ezetimibe]     Myalgia    Neosporin [Neomycin-Bacitracin Zn-Polymyx] Rash   Social History   Socioeconomic History   Marital status: Single    Spouse name: Not on file   Number of children: Not on file   Years of education: Not on file   Highest education level: Not on file  Occupational History   Not on file  Tobacco Use   Smoking status: Never    Passive exposure: Never   Smokeless tobacco: Never  Vaping Use   Vaping status: Never Used  Substance and Sexual Activity   Alcohol  use: No   Drug use: No   Sexual activity: Not on file  Other Topics Concern   Not on file  Social History Narrative   Not on file   Social Drivers of Health   Financial Resource Strain: Not on file  Food Insecurity: No Food Insecurity (12/25/2022)   Hunger Vital Sign    Worried About Running Out of Food in the Last Year: Never true    Ran Out of Food in the Last Year: Never true  Transportation Needs: No Transportation Needs (12/25/2022)   PRAPARE - Administrator, Civil Service (Medical): No    Lack of Transportation (Non-Medical): No  Physical Activity: Not on file  Stress: Not on file  Social Connections: Not on file  Intimate Partner Violence: Not At Risk (12/25/2022)   Humiliation, Afraid, Rape, and Kick questionnaire    Fear of Current or Ex-Partner: No    Emotionally Abused: No    Physically Abused: No    Sexually Abused: No   Family History  Problem Relation Age of Onset   Cancer Mother    Cancer Father    Heart attack Brother     Polycystic kidney disease Brother    Cancer Other    BP 138/82   Pulse 61   Ht 5' 3 (1.6 m)   Wt 69 kg (152 lb 3.2 oz)   SpO2 99%   BMI 26.96 kg/m   Wt Readings from Last 3 Encounters:  04/06/24 69 kg (152 lb 3.2 oz)  12/08/23 64.6 kg (142 lb 6.4 oz)  10/29/23 63.6 kg (140 lb 4.8 oz)   PHYSICAL EXAM: General: Frail appearing. No  distress on RA. Walking with cane Cardiac: JVP ~7cm. S1 and S2 present. No murmurs or rub. Abdomen: Soft, non-tender, non-distended.  Extremities: Warm and dry.  No peripheral edema. B/l bruising to dorsal feet Neuro: Alert and oriented x3. Affect pleasant.   ReDs reading: 24%, normal  ASSESSMENT & PLAN: 1. Chronic Systolic HF  - Echo at [facility] (1/24):  EF 15%, LV mod-sev dilated, LA mildly dilated, RV function normal, mod pulmonary hypertension, mod MR - R/LHC (1/24): mild CAD, biventricular failure and cardiogenic shock - cMRI (1/24): LVEF 17% RV EF 23%. Extracellular volume 36%. + myocardial fibrosis but no other findings of infiltrative disease  - Echo (5/24): EF 20-25%, RV mildly reduced, mild MR - Now with improved EF (3/25) 40-45%.  - Etiology of CM unclear. Not candidate for advanced therapies with age and comorbidities - NYHA II/early III confounded by joint pain. Volume okay by exam and reds - Continue Lasix  40 mg daily. BMET today - Continue Entresto  to 97/103 mg BID - Continue spiro 25 mg daily - Continue Toprol  XL 25 mg daily. - Continue digoxin  0.0625 mg daily.  - Remain off Farxiga  due to severe GU s/s and yeast infections - Worry somewhat about cognitive impairment and medication adherence. Uses a pill box daily, feels comfortable with this.    2. CAD - mild, nonobstructive CAD by cath (1/24) - No chest pain - Continue ASA + statin  3. DM II - Managed by PCP - Off SGLT2i given GU symptoms - Off GLP1i due to nausea and fatigue  - Now off metformin for diarrhea. Placed on glipizide. Reports that her BG has been  swinging from 300-50s daily.  - Refer to endocrine for assistance   4. Chronic anemia - Iron and B12 deficient. Management per Hematology - Receiving Vitamin B12 injections monthly. She received IV iron in 03/24 but has not required IV iron since.  5. HTN - BP stable today - GDMT as above   Follow up in 4 months with APP  Erica Alexander Aument, NP 04/06/2024

## 2024-04-06 ENCOUNTER — Ambulatory Visit (HOSPITAL_COMMUNITY): Payer: Self-pay | Admitting: Cardiology

## 2024-04-06 ENCOUNTER — Encounter (HOSPITAL_COMMUNITY): Payer: Self-pay

## 2024-04-06 ENCOUNTER — Ambulatory Visit (HOSPITAL_COMMUNITY)
Admission: RE | Admit: 2024-04-06 | Discharge: 2024-04-06 | Disposition: A | Discharge status: Home / Self Care | Payer: PPO | Source: Ambulatory Visit | Attending: Cardiology | Admitting: Cardiology

## 2024-04-06 VITALS — BP 138/82 | HR 61 | Ht 63.0 in | Wt 152.2 lb

## 2024-04-06 DIAGNOSIS — N1831 Chronic kidney disease, stage 3a: Secondary | ICD-10-CM | POA: Insufficient documentation

## 2024-04-06 DIAGNOSIS — E538 Deficiency of other specified B group vitamins: Secondary | ICD-10-CM | POA: Diagnosis not present

## 2024-04-06 DIAGNOSIS — K219 Gastro-esophageal reflux disease without esophagitis: Secondary | ICD-10-CM | POA: Insufficient documentation

## 2024-04-06 DIAGNOSIS — D649 Anemia, unspecified: Secondary | ICD-10-CM

## 2024-04-06 DIAGNOSIS — G8929 Other chronic pain: Secondary | ICD-10-CM | POA: Diagnosis not present

## 2024-04-06 DIAGNOSIS — I272 Pulmonary hypertension, unspecified: Secondary | ICD-10-CM | POA: Insufficient documentation

## 2024-04-06 DIAGNOSIS — I2510 Atherosclerotic heart disease of native coronary artery without angina pectoris: Secondary | ICD-10-CM | POA: Diagnosis not present

## 2024-04-06 DIAGNOSIS — I130 Hypertensive heart and chronic kidney disease with heart failure and stage 1 through stage 4 chronic kidney disease, or unspecified chronic kidney disease: Secondary | ICD-10-CM | POA: Diagnosis not present

## 2024-04-06 DIAGNOSIS — D631 Anemia in chronic kidney disease: Secondary | ICD-10-CM | POA: Insufficient documentation

## 2024-04-06 DIAGNOSIS — E611 Iron deficiency: Secondary | ICD-10-CM | POA: Diagnosis not present

## 2024-04-06 DIAGNOSIS — E1142 Type 2 diabetes mellitus with diabetic polyneuropathy: Secondary | ICD-10-CM | POA: Diagnosis not present

## 2024-04-06 DIAGNOSIS — M797 Fibromyalgia: Secondary | ICD-10-CM | POA: Diagnosis not present

## 2024-04-06 DIAGNOSIS — Z79899 Other long term (current) drug therapy: Secondary | ICD-10-CM | POA: Insufficient documentation

## 2024-04-06 DIAGNOSIS — I5022 Chronic systolic (congestive) heart failure: Secondary | ICD-10-CM | POA: Diagnosis not present

## 2024-04-06 DIAGNOSIS — I10 Essential (primary) hypertension: Secondary | ICD-10-CM | POA: Diagnosis not present

## 2024-04-06 DIAGNOSIS — M2550 Pain in unspecified joint: Secondary | ICD-10-CM | POA: Diagnosis not present

## 2024-04-06 DIAGNOSIS — E1122 Type 2 diabetes mellitus with diabetic chronic kidney disease: Secondary | ICD-10-CM | POA: Diagnosis not present

## 2024-04-06 LAB — BASIC METABOLIC PANEL WITH GFR
Anion gap: 9 (ref 5–15)
BUN: 23 mg/dL (ref 8–23)
CO2: 24 mmol/L (ref 22–32)
Calcium: 9.7 mg/dL (ref 8.9–10.3)
Chloride: 102 mmol/L (ref 98–111)
Creatinine, Ser: 1.17 mg/dL — ABNORMAL HIGH (ref 0.44–1.00)
GFR, Estimated: 47 mL/min — ABNORMAL LOW (ref >60)
Glucose, Bld: 159 mg/dL — ABNORMAL HIGH (ref 70–99)
Potassium: 4.8 mmol/L (ref 3.5–5.1)
Sodium: 135 mmol/L (ref 135–145)

## 2024-04-06 NOTE — Patient Instructions (Addendum)
 Thank you for coming in today  If you had labs drawn today, any labs that are abnormal the clinic will call you No news is good news  You have been referred to Endocrine clinic, their office will call you for further appointment details    Medications: No changes  Follow up appointments:  Your physician recommends that you schedule a follow-up appointment in:  4 months in clinic   Do the following things EVERYDAY: Weigh yourself in the morning before breakfast. Write it down and keep it in a log. Take your medicines as prescribed Eat low salt foods--Limit salt (sodium) to 2000 mg per day.  Stay as active as you can everyday Limit all fluids for the day to less than 2 liters   At the [facility], you and your health needs are our priority. As part of our continuing mission to provide you with exceptional heart care, we have created designated Provider Care Teams. These Care Teams include your primary Cardiologist (physician) and Advanced Practice Providers (APPs- Physician Assistants and Nurse Practitioners) who all work together to provide you with the care you need, when you need it.   You may see any of the following providers on your designated Care Team at your next follow up: Dr Toribio Fuel Dr Ezra Shuck Dr. Ria Gardenia Greig Lenetta, NP Caffie Shed, [place] [facility] [place], [place] Beckey Coe, NP Tinnie Redman, PharmD   Please be sure to bring in all your medications bottles to every appointment.    Thank you for choosing [facility]  If you have any questions or concerns before your next appointment please send us  a message through [place] or call our office at [phone].    TO LEAVE A MESSAGE FOR THE NURSE SELECT OPTION 2, PLEASE LEAVE A MESSAGE INCLUDING: YOUR NAME DATE OF BIRTH CALL BACK NUMBER REASON FOR CALL**this is important as we prioritize the call backs  YOU WILL RECEIVE  A CALL BACK THE SAME DAY AS LONG AS YOU CALL BEFORE 4:00 PM

## 2024-04-07 ENCOUNTER — Inpatient Hospital Stay: Payer: PPO

## 2024-04-10 DIAGNOSIS — I5020 Unspecified systolic (congestive) heart failure: Secondary | ICD-10-CM | POA: Diagnosis not present

## 2024-04-10 DIAGNOSIS — E1122 Type 2 diabetes mellitus with diabetic chronic kidney disease: Secondary | ICD-10-CM | POA: Diagnosis not present

## 2024-04-10 DIAGNOSIS — D509 Iron deficiency anemia, unspecified: Secondary | ICD-10-CM | POA: Diagnosis not present

## 2024-04-19 DIAGNOSIS — I10 Essential (primary) hypertension: Secondary | ICD-10-CM | POA: Diagnosis not present

## 2024-04-19 DIAGNOSIS — E782 Mixed hyperlipidemia: Secondary | ICD-10-CM | POA: Diagnosis not present

## 2024-04-19 DIAGNOSIS — Z6827 Body mass index (BMI) 27.0-27.9, adult: Secondary | ICD-10-CM | POA: Diagnosis not present

## 2024-04-19 DIAGNOSIS — E1122 Type 2 diabetes mellitus with diabetic chronic kidney disease: Secondary | ICD-10-CM | POA: Diagnosis not present

## 2024-04-19 DIAGNOSIS — I5020 Unspecified systolic (congestive) heart failure: Secondary | ICD-10-CM | POA: Diagnosis not present

## 2024-04-20 DIAGNOSIS — L281 Prurigo nodularis: Secondary | ICD-10-CM | POA: Diagnosis not present

## 2024-05-01 ENCOUNTER — Inpatient Hospital Stay: Payer: PPO

## 2024-05-01 ENCOUNTER — Inpatient Hospital Stay: Payer: PPO | Attending: Hematology

## 2024-05-01 DIAGNOSIS — E876 Hypokalemia: Secondary | ICD-10-CM | POA: Insufficient documentation

## 2024-05-01 DIAGNOSIS — D509 Iron deficiency anemia, unspecified: Secondary | ICD-10-CM | POA: Diagnosis not present

## 2024-05-01 DIAGNOSIS — E538 Deficiency of other specified B group vitamins: Secondary | ICD-10-CM | POA: Diagnosis not present

## 2024-05-01 DIAGNOSIS — R768 Other specified abnormal immunological findings in serum: Secondary | ICD-10-CM

## 2024-05-01 DIAGNOSIS — R7989 Other specified abnormal findings of blood chemistry: Secondary | ICD-10-CM | POA: Diagnosis not present

## 2024-05-01 LAB — FERRITIN: Ferritin: 84 ng/mL (ref 11–307)

## 2024-05-01 LAB — FOLATE: Folate: >40 ng/mL (ref >5.9)

## 2024-05-01 LAB — IRON AND TIBC
Iron: 82 ug/dL (ref 28–170)
Saturation Ratios: 25 % (ref 10.4–31.8)
TIBC: 322 ug/dL (ref 250–450)
UIBC: 240 ug/dL

## 2024-05-01 LAB — CBC WITH DIFFERENTIAL/PLATELET
Abs Immature Granulocytes: 0.07 K/uL (ref 0.00–0.07)
Basophils Absolute: 0.1 K/uL (ref 0.0–0.1)
Basophils Relative: 1 %
Eosinophils Absolute: 0.3 K/uL (ref 0.0–0.5)
Eosinophils Relative: 3 %
HCT: 42.8 % (ref 36.0–46.0)
Hemoglobin: 13.5 g/dL (ref 12.0–15.0)
Immature Granulocytes: 1 %
Lymphocytes Relative: 28 %
Lymphs Abs: 2.7 K/uL (ref 0.7–4.0)
MCH: 29.5 pg (ref 26.0–34.0)
MCHC: 31.5 g/dL (ref 30.0–36.0)
MCV: 93.4 fL (ref 80.0–100.0)
Monocytes Absolute: 0.9 K/uL (ref 0.1–1.0)
Monocytes Relative: 9 %
Neutro Abs: 5.8 K/uL (ref 1.7–7.7)
Neutrophils Relative %: 58 %
Platelets: 304 K/uL (ref 150–400)
RBC: 4.58 MIL/uL (ref 3.87–5.11)
RDW: 13.2 % (ref 11.5–15.5)
WBC: 9.9 K/uL (ref 4.0–10.5)
nRBC: 0 % (ref 0.0–0.2)

## 2024-05-01 LAB — VITAMIN B12: Vitamin B-12: 337 pg/mL (ref 180–914)

## 2024-05-02 DIAGNOSIS — M545 Low back pain, unspecified: Secondary | ICD-10-CM | POA: Diagnosis not present

## 2024-05-02 DIAGNOSIS — M542 Cervicalgia: Secondary | ICD-10-CM | POA: Diagnosis not present

## 2024-05-03 LAB — MULTIPLE MYELOMA PANEL, SERUM
Albumin SerPl Elph-Mcnc: 3.8 g/dL (ref 2.9–4.4)
Albumin/Glob SerPl: 1.2 (ref 0.7–1.7)
Alpha 1: 0.2 g/dL (ref 0.0–0.4)
Alpha2 Glob SerPl Elph-Mcnc: 0.9 g/dL (ref 0.4–1.0)
B-Globulin SerPl Elph-Mcnc: 1.2 g/dL (ref 0.7–1.3)
Gamma Glob SerPl Elph-Mcnc: 1 g/dL (ref 0.4–1.8)
Globulin, Total: 3.3 g/dL (ref 2.2–3.9)
IgA: 278 mg/dL (ref 64–422)
IgG (Immunoglobin G), Serum: 993 mg/dL (ref 586–1602)
IgM (Immunoglobulin M), Srm: 103 mg/dL (ref 26–217)
Total Protein ELP: 7.1 g/dL (ref 6.0–8.5)

## 2024-05-03 LAB — METHYLMALONIC ACID, SERUM: Methylmalonic Acid, Quantitative: 227 nmol/L (ref 0–378)

## 2024-05-04 ENCOUNTER — Other Ambulatory Visit: Payer: PPO

## 2024-05-04 DIAGNOSIS — F331 Major depressive disorder, recurrent, moderate: Secondary | ICD-10-CM | POA: Diagnosis not present

## 2024-05-04 DIAGNOSIS — I10 Essential (primary) hypertension: Secondary | ICD-10-CM | POA: Diagnosis not present

## 2024-05-04 DIAGNOSIS — E1122 Type 2 diabetes mellitus with diabetic chronic kidney disease: Secondary | ICD-10-CM | POA: Diagnosis not present

## 2024-05-04 DIAGNOSIS — E782 Mixed hyperlipidemia: Secondary | ICD-10-CM | POA: Diagnosis not present

## 2024-05-04 DIAGNOSIS — K2100 Gastro-esophageal reflux disease with esophagitis, without bleeding: Secondary | ICD-10-CM | POA: Diagnosis not present

## 2024-05-04 DIAGNOSIS — I5020 Unspecified systolic (congestive) heart failure: Secondary | ICD-10-CM | POA: Diagnosis not present

## 2024-05-04 DIAGNOSIS — Z96641 Presence of right artificial hip joint: Secondary | ICD-10-CM | POA: Diagnosis not present

## 2024-05-04 DIAGNOSIS — D509 Iron deficiency anemia, unspecified: Secondary | ICD-10-CM | POA: Diagnosis not present

## 2024-05-04 DIAGNOSIS — R413 Other amnesia: Secondary | ICD-10-CM | POA: Diagnosis not present

## 2024-05-05 ENCOUNTER — Other Ambulatory Visit (HOSPITAL_COMMUNITY): Payer: Self-pay | Admitting: Physician Assistant

## 2024-05-09 DIAGNOSIS — M79674 Pain in right toe(s): Secondary | ICD-10-CM | POA: Diagnosis not present

## 2024-05-09 DIAGNOSIS — L84 Corns and callosities: Secondary | ICD-10-CM | POA: Diagnosis not present

## 2024-05-09 DIAGNOSIS — E1142 Type 2 diabetes mellitus with diabetic polyneuropathy: Secondary | ICD-10-CM | POA: Diagnosis not present

## 2024-05-09 DIAGNOSIS — M79675 Pain in left toe(s): Secondary | ICD-10-CM | POA: Diagnosis not present

## 2024-05-09 DIAGNOSIS — B351 Tinea unguium: Secondary | ICD-10-CM | POA: Diagnosis not present

## 2024-05-11 ENCOUNTER — Inpatient Hospital Stay: Payer: PPO

## 2024-05-11 ENCOUNTER — Inpatient Hospital Stay: Payer: PPO | Admitting: Oncology

## 2024-05-11 DIAGNOSIS — E1122 Type 2 diabetes mellitus with diabetic chronic kidney disease: Secondary | ICD-10-CM | POA: Diagnosis not present

## 2024-05-11 DIAGNOSIS — E538 Deficiency of other specified B group vitamins: Secondary | ICD-10-CM

## 2024-05-11 DIAGNOSIS — I10 Essential (primary) hypertension: Secondary | ICD-10-CM | POA: Diagnosis not present

## 2024-05-11 DIAGNOSIS — D509 Iron deficiency anemia, unspecified: Secondary | ICD-10-CM | POA: Diagnosis not present

## 2024-05-11 DIAGNOSIS — I5020 Unspecified systolic (congestive) heart failure: Secondary | ICD-10-CM | POA: Diagnosis not present

## 2024-05-11 DIAGNOSIS — E782 Mixed hyperlipidemia: Secondary | ICD-10-CM | POA: Diagnosis not present

## 2024-05-11 NOTE — Progress Notes (Unsigned)
 [facility] [address], [place] [address]   Clinic Day:  05/11/2024  Referring physician: Rogers Hai, MD  Patient Care Team: Toribio Jerel MATSU, MD as PCP - General (Family Medicine) Rogers Hai, MD as Medical Oncologist (Hematology)   ASSESSMENT & PLAN:   Assessment:  1.  Severe microcytic anemia from iron deficiency: - CBC (11/06/2022): Hb-10.1, MCV-73.3 - 10/30/2022: Ferritin-12, percent saturation 12 - Feraheme x 2 (3/19 and 3/26) - She is not on oral iron therapy.  No prior history of blood transfusion.  Denies any bleeding per rectum or melena. - Last colonoscopy at [facility] long time ago.  She thinks she had at least 5 colonoscopies.  2.  Social/family history: - Lives with a friend at home.  She is independent of ADLs and IADLs.  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  Non-smoker. - Son has anemia.  Mother had ovarian cancer.  Father had prostate cancer.  Sister had melanoma.  Plan:  1.  Microcytic anemia from iron deficiency: -Received 2 doses of IV Feraheme on 12/29/2022 and 01/05/2023. -Denies any melena, hematochezia, hemoptysis or nosebleeds.  Fecal occult x 3 was negative. -Received monthly B12 injections up until 01/31/2024.  B12 levels on 01/31/2024 were significantly elevated at 3310 with MMA of 225.  B12 shots were stopped. -Lab work from 05/01/2024 shows ferritin of 84 (125), iron saturation 25%, B12 level of 337 with a normal MMA.  Hemoglobin is 13.5 with a normal differential. -We discussed restarting B12 given significant drop in her B12 levels along with 1 dose of IV Feraheme.  She reports she is extremely fatigued. -Recommend starting B12 shots monthly when she receives her dose of IV Feraheme.  2.  Hypokalemia: -Resolved.   3.  Elevated kappa lambda free light chain: -Most recent protein electrophoresis was WNL.  No evidence of M spike. -We will periodically continue to  monitor.   PLAN SUMMARY: >> Restart monthly B12 shots.  She will get her first injection when she starts her iron infusion. >> Recommend 1 dose of IV Feraheme. >> Return to clinic monthly for B12 injections and in 6 months for lab work and see MD/NP.    I spent 20 minutes dedicated to the care of this patient (face-to-face and non-face-to-face) on the date of the encounter to include what is described in the assessment and plan.    Orders Placed This Encounter  Procedures   CBC with Differential    Standing Status:   Future    Expected Date:   11/12/2024    Expiration Date:   05/11/2025   Ferritin    Standing Status:   Future    Expected Date:   11/12/2024    Expiration Date:   05/11/2025   Folate    Standing Status:   Future    Expected Date:   11/12/2024    Expiration Date:   05/11/2025   Iron and TIBC ([facility] ONLY)    Standing Status:   Future    Expected Date:   11/12/2024    Expiration Date:   05/11/2025   Methylmalonic acid, serum    Standing Status:   Future    Expected Date:   11/12/2024    Expiration Date:   05/11/2025   Vitamin B12    Standing Status:   Future    Expected Date:   11/12/2024    Expiration Date:   05/11/2025   Delon BRAVO  Cloma Rahrig, NP   7/31/20251:36 PM  CHIEF COMPLAINT/PURPOSE OF CONSULT:   Diagnosis: Microcytic anemia from iron deficiency  Current Therapy: Feraheme   HISTORY OF PRESENT ILLNESS:   Mykira is a 78 y.o. female presenting to clinic today for follow-up for anemia.  She was last seen in clinic on 10/29/2023.  She she was receiving monthly B12 shots but these were stopped back in April due to an elevated B12 level.  She has not had IV iron in over a year.  Reports energy levels that are very low.  Appetite is also low.  She was recently started on Mounjaro because her diabetes is controlled.  Reports she was having severe highs and lows of her blood sugars so she was taking off glipizide and put on Mounjaro.  This was started  about a week ago.  She already feels significantly better.  She was also recently placed on gabapentin  for low back pain.  She previously had received injections but unfortunately it had caused her blood sugars to be elevated.  Reports she has bilateral shoulder pain and low back pain.  She thinks she will be scheduled for an eye surgery as soon as her blood sugars are under control.  She sees [facility] for this.   She denies any ice pica.  Denies any bleeding per rectum or melena.  No prior history of blood transfusion.     PAST MEDICAL HISTORY:   Past Medical History: Past Medical History:  Diagnosis Date   Chronic kidney disease    stage 4   Depression    Diabetes mellitus without complication (HCC)    GERD (gastroesophageal reflux disease)    Hypertension    Neuromuscular disorder (HCC)    peripheral neuropathy    Surgical History: Past Surgical History:  Procedure Laterality Date   ABDOMINAL HYSTERECTOMY  10   CHOLECYSTECTOMY     age 46   DILATION AND CURETTAGE OF UTERUS     x 4  1980   RIGHT/LEFT HEART CATH AND CORONARY ANGIOGRAPHY N/A 10/30/2022   Procedure: RIGHT/LEFT HEART CATH AND CORONARY ANGIOGRAPHY;  Surgeon: Cherrie Toribio SAUNDERS, MD;  Location: [facility] INVASIVE CV [facility];  Service: Cardiovascular;  Laterality: N/A;   TONSILLECTOMY     age 59   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   Procedure: TOTAL HIP ARTHROPLASTY ANTERIOR APPROACH;  Surgeon: Yvone Rush, MD;  Location: [facility];  Service: Orthopedics;  Laterality: Right;    Social History: Social History   Socioeconomic History   Marital status: Single    Spouse name: Not on file   Number of children: Not on file   Years of education: Not on file   Highest education level: Not on file  Occupational History   Not on file  Tobacco Use   Smoking status: Never    Passive exposure: Never   Smokeless tobacco: Never  Vaping Use   Vaping status: Never Used  Substance and Sexual Activity   Alcohol  use: No   Drug use:  No   Sexual activity: Not on file  Other Topics Concern   Not on file  Social History Narrative   Not on file   Social Drivers of Health   Financial Resource Strain: Not on file  Food Insecurity: No Food Insecurity (12/25/2022)   Hunger Vital Sign    Worried About Running Out of Food in the Last Year: Never true    Ran Out of Food in the Last Year: Never true  Transportation Needs: No Transportation  Needs (12/25/2022)   PRAPARE - Administrator, Civil Service (Medical): No    Lack of Transportation (Non-Medical): No  Physical Activity: Not on file  Stress: Not on file  Social Connections: Not on file  Intimate Partner Violence: Not At Risk (12/25/2022)   Humiliation, Afraid, Rape, and Kick questionnaire    Fear of Current or Ex-Partner: No    Emotionally Abused: No    Physically Abused: No    Sexually Abused: No    Family History: Family History  Problem Relation Age of Onset   Cancer Mother    Cancer Father    Heart attack Brother    Polycystic kidney disease Brother    Cancer Other     Current Medications:  Current Outpatient Medications:    amphetamine-dextroamphetamine (ADDERALL) 20 MG tablet, Take 20-40 mg by mouth See admin instructions. Take 40 mg (2 tablets) by mouth in the morning and 20 mg (1 tablet) at 2 pm, Disp: , Rfl:    aspirin  EC 81 MG tablet, Take 1 tablet (81 mg total) by mouth daily. Swallow whole., Disp: 30 tablet, Rfl: 12   atorvastatin  (LIPITOR) 10 MG tablet, Take 10 mg by mouth every Wednesday., Disp: , Rfl:    busPIRone  (BUSPAR ) 30 MG tablet, Take 30 mg by mouth 3 (three) times daily., Disp: , Rfl:    Calcium  Carb-Cholecalciferol (CALCIUM  500+D PO), Take 1 tablet by mouth daily., Disp: , Rfl:    cholecalciferol (VITAMIN D3) 25 MCG (1000 UNIT) tablet, Take 2,000 Units by mouth daily., Disp: , Rfl:    cycloSPORINE (RESTASIS) 0.05 % ophthalmic emulsion, Place 2 drops into both eyes 2 (two) times daily., Disp: , Rfl:    digoxin  (LANOXIN )  0.125 MG tablet, Take 0.5 tablets (0.0625 mg total) by mouth daily., Disp: 15 tablet, Rfl: 0   donepezil  (ARICEPT ) 5 MG tablet, Take 5 mg by mouth at bedtime., Disp: , Rfl:    DULoxetine  (CYMBALTA ) 60 MG capsule, Take 120 mg by mouth daily at 12 noon., Disp: , Rfl:    ENTRESTO  97-103 MG, Take 1 tablet by mouth twice daily, Disp: 240 tablet, Rfl: 0   folic acid  (FOLVITE ) 800 MCG tablet, Take 800-2,400 mcg by mouth daily., Disp: , Rfl:    furosemide  (LASIX ) 40 MG tablet, Take 40 mg by mouth daily., Disp: , Rfl:    gabapentin  (NEURONTIN ) 100 MG capsule, Take 1 capsule qhs x 1 week then increase to 2 capsules qhs., Disp: , Rfl:    Guaifenesin (MUCINEX MAXIMUM STRENGTH) 1200 MG TB12, Take 1,200 mg by mouth daily as needed (severe congestion)., Disp: , Rfl:    Lancets (ONETOUCH DELICA PLUS LANCET30G) MISC, , Disp: , Rfl:    metoprolol  succinate (TOPROL  XL) 25 MG 24 hr tablet, Take 1 tablet (25 mg total) by mouth daily., Disp: 60 tablet, Rfl: 5   MOUNJARO 2.5 MG/0.5ML Pen, , Disp: , Rfl:    Multiple Minerals-Vitamins (CAL MAG ZINC +D3) TABS, Take 1 tablet by mouth daily., Disp: , Rfl:    omeprazole (PRILOSEC) 20 MG capsule, Take 20 mg by mouth daily., Disp: , Rfl:    ONETOUCH VERIO test strip, 1 each daily., Disp: , Rfl:    Probiotic Product (PROBIOTIC PO), Take 1 capsule by mouth daily., Disp: , Rfl:    spironolactone  (ALDACTONE ) 25 MG tablet, Take 1 tablet (25 mg total) by mouth daily., Disp: 30 tablet, Rfl: 0   triamcinolone  cream (KENALOG ) 0.1 %, Apply 1 application. topically 3 (three) times daily  as needed for itching or rash., Disp: , Rfl:    Allergies: Allergies  Allergen Reactions   Sulfa Antibiotics Shortness Of Breath    Other Reaction(s): Not available   Ace Inhibitors Cough    Pt tolerates lisinopril    Codeine Itching and Swelling    Other Reaction(s): Not available   Ezetimibe     Myalgia  Other Reaction(s): Not available   Penicillins     Unknown reaction     Statins      Body aches  Other Reaction(s): Not available   Tramadol     Numbness  Other Reaction(s): Not available   Neosporin [Neomycin-Bacitracin Zn-Polymyx] Rash    REVIEW OF SYSTEMS:   Review of Systems  Constitutional:  Positive for fatigue.  Respiratory:  Positive for shortness of breath.   Musculoskeletal:  Positive for back pain and neck pain.  Neurological:  Positive for dizziness and headaches.     VITALS:   Blood pressure 110/71, pulse 68, temperature 97.6 F (36.4 C), temperature source Oral, resp. rate 18, weight 149 lb 7.6 oz (67.8 kg), SpO2 98%.  Wt Readings from Last 3 Encounters:  05/11/24 149 lb 7.6 oz (67.8 kg)  04/06/24 152 lb 3.2 oz (69 kg)  12/08/23 142 lb 6.4 oz (64.6 kg)    Body mass index is 26.48 kg/m.   PHYSICAL EXAM:   Physical Exam Constitutional:      Appearance: Normal appearance.  Cardiovascular:     Rate and Rhythm: Normal rate and regular rhythm.  Pulmonary:     Effort: Pulmonary effort is normal.     Breath sounds: Normal breath sounds.  Abdominal:     General: Bowel sounds are normal.     Palpations: Abdomen is soft.  Musculoskeletal:        General: No swelling. Normal range of motion.  Neurological:     Mental Status: She is alert and oriented to person, place, and time. Mental status is at baseline.     LABS:      Latest Ref Rng & Units 05/01/2024    1:39 PM 01/31/2024    1:24 PM 10/08/2023    9:22 AM  CBC  WBC 4.0 - 10.5 K/uL 9.9  9.5  7.9   Hemoglobin 12.0 - 15.0 g/dL 86.4  86.7  86.9   Hematocrit 36.0 - 46.0 % 42.8  42.5  40.9   Platelets 150 - 400 K/uL 304  308  311       Latest Ref Rng & Units 04/06/2024    2:14 PM 12/13/2023   12:20 PM 04/21/2023    9:16 AM  CMP  Glucose 70 - 99 mg/dL 840  806    BUN 8 - 23 mg/dL 23  16    Creatinine 9.55 - 1.00 mg/dL 8.82  8.71    Sodium 864 - 145 mmol/L 135  140    Potassium 3.5 - 5.1 mmol/L 4.8  4.3  4.2   Chloride 98 - 111 mmol/L 102  98    CO2 22 - 32 mmol/L 24  25     Calcium  8.9 - 10.3 mg/dL 9.7  9.9       No results found for: CEA1, CEA / No results found for: CEA1, CEA No results found for: PSA1 No results found for: CAN199 No results found for: CAN125  Lab Results  Component Value Date   TOTALPROTELP 7.1 05/01/2024   ALBUMINELP 3.8 12/25/2022   A1GS 0.2 12/25/2022   A2GS 0.8 12/25/2022  BETS 1.1 12/25/2022   GAMS 1.1 12/25/2022   MSPIKE Not Observed 12/25/2022   SPEI Comment 12/25/2022   Lab Results  Component Value Date   TIBC 322 05/01/2024   TIBC 319 01/31/2024   TIBC 298 10/08/2023   FERRITIN 84 05/01/2024   FERRITIN 125 01/31/2024   FERRITIN 123 10/08/2023   IRONPCTSAT 25 05/01/2024   IRONPCTSAT 33 (H) 01/31/2024   IRONPCTSAT 25 10/08/2023   Lab Results  Component Value Date   LDH 134 12/25/2022     STUDIES:   No results found.

## 2024-05-12 ENCOUNTER — Encounter: Payer: Self-pay | Admitting: Hematology

## 2024-05-18 ENCOUNTER — Inpatient Hospital Stay: Attending: Hematology

## 2024-05-18 ENCOUNTER — Inpatient Hospital Stay

## 2024-05-18 VITALS — BP 107/51 | HR 53 | Temp 97.6°F | Resp 17 | Wt 148.4 lb

## 2024-05-18 DIAGNOSIS — D509 Iron deficiency anemia, unspecified: Secondary | ICD-10-CM | POA: Insufficient documentation

## 2024-05-18 DIAGNOSIS — E538 Deficiency of other specified B group vitamins: Secondary | ICD-10-CM | POA: Diagnosis not present

## 2024-05-18 MED ORDER — CYANOCOBALAMIN 1000 MCG/ML IJ SOLN
1000.0000 ug | Freq: Once | INTRAMUSCULAR | Status: AC
  Administered 2024-05-18: 1000 ug via INTRAMUSCULAR
  Filled 2024-05-18: qty 1

## 2024-05-18 MED ORDER — SODIUM CHLORIDE 0.9 % IV SOLN: Freq: Once | INTRAVENOUS | Status: AC

## 2024-05-18 MED ORDER — SODIUM CHLORIDE 0.9 % IV SOLN
510.0000 mg | Freq: Once | INTRAVENOUS | Status: AC
  Administered 2024-05-18: 510 mg via INTRAVENOUS
  Filled 2024-05-18: qty 510

## 2024-05-18 MED ORDER — CETIRIZINE HCL 10 MG PO TABS
10.0000 mg | ORAL_TABLET | Freq: Once | ORAL | Status: AC
  Administered 2024-05-18: 10 mg via ORAL
  Filled 2024-05-18: qty 1

## 2024-05-18 MED ORDER — ACETAMINOPHEN 325 MG PO TABS
650.0000 mg | ORAL_TABLET | Freq: Once | ORAL | Status: AC
  Administered 2024-05-18: 650 mg via ORAL
  Filled 2024-05-18: qty 2

## 2024-05-18 NOTE — Patient Instructions (Signed)
 [facility] [place] - A DEPT OF [facility]  Discharge Instructions: Thank you for choosing [facility] to provide your oncology and hematology care.  If you have a lab appointment with the Cancer Center - please note that after April 8th, 2024, all labs will be drawn in the cancer center.  You do not have to check in or register with the main entrance as you have in the past but will complete your check-in in the cancer center.  Wear comfortable clothing and clothing appropriate for easy access to any Portacath or PICC line.   We strive to give you quality time with your provider. You may need to reschedule your appointment if you arrive late (15 or more minutes).  Arriving late affects you and other patients whose appointments are after yours.  Also, if you miss three or more appointments without notifying the office, you may be dismissed from the clinic at the provider's discretion.      For prescription refill requests, have your pharmacy contact our office and allow 72 hours for refills to be completed.    Today you received the following chemotherapy and/or immunotherapy agents Feraheme. Ferumoxytol Injection What is this medication? FERUMOXYTOL (FER ue MOX i tol) treats low levels of iron in your body (iron deficiency anemia). Iron is a mineral that plays an important role in making red blood cells, which carry oxygen from your lungs to the rest of your body. This medicine may be used for other purposes; ask your health care provider or pharmacist if you have questions. COMMON BRAND NAME(S): Feraheme What should I tell my care team before I take this medication? They need to know if you have any of these conditions: Anemia not caused by low iron levels High levels of iron in the blood Magnetic resonance imaging (MRI) test scheduled An unusual or allergic reaction to iron, other medications, foods, dyes, or preservatives Pregnant or trying to get  pregnant Breastfeeding How should I use this medication? This medication is injected into a vein. It is given by your care team in a hospital or clinic setting. Talk to your care team the use of this medication in children. Special care may be needed. Overdosage: If you think you have taken too much of this medicine contact a poison control center or emergency room at once. NOTE: This medicine is only for you. Do not share this medicine with others. What if I miss a dose? It is important not to miss your dose. Call your care team if you are unable to keep an appointment. What may interact with this medication? Other iron products This list may not describe all possible interactions. Give your health care provider a list of all the medicines, herbs, non-prescription drugs, or dietary supplements you use. Also tell them if you smoke, drink alcohol, or use illegal drugs. Some items may interact with your medicine. What should I watch for while using this medication? Visit your care team for regular checks on your progress. Tell your care team if your symptoms do not start to get better or if they get worse. You may need blood work done while you are taking this medication. You may need to eat more foods that contain iron. Talk to your care team. Foods that contain iron include whole grains or cereals, dried fruits, beans, peas, leafy green vegetables, and organ meats (liver, kidney). What side effects may I notice from receiving this medication? Side effects that  you should report to your care team as soon as possible: Allergic reactions--skin rash, itching, hives, swelling of the face, lips, tongue, or throat Low blood pressure--dizziness, feeling faint or lightheaded, blurry vision Shortness of breath Side effects that usually do not require medical attention (report to your care team if they continue or are bothersome): Flushing Headache Joint pain Muscle pain Nausea Pain, redness, or  irritation at injection site This list may not describe all possible side effects. Call your doctor for medical advice about side effects. You may report side effects to FDA at 1-800-FDA-1088. Where should I keep my medication? This medication is given in a hospital or clinic. It will not be stored at home. NOTE: This sheet is a summary. It may not cover all possible information. If you have questions about this medicine, talk to your doctor, pharmacist, or health care provider.  2024 Elsevier/Gold Standard (2023-05-19 00:00:00)      To help prevent nausea and vomiting after your treatment, we encourage you to take your nausea medication as directed.  BELOW ARE SYMPTOMS THAT SHOULD BE REPORTED IMMEDIATELY: *FEVER GREATER THAN 100.4 F (38 C) OR HIGHER *CHILLS OR SWEATING *NAUSEA AND VOMITING THAT IS NOT CONTROLLED WITH YOUR NAUSEA MEDICATION *UNUSUAL SHORTNESS OF BREATH *UNUSUAL BRUISING OR BLEEDING *URINARY PROBLEMS (pain or burning when urinating, or frequent urination) *BOWEL PROBLEMS (unusual diarrhea, constipation, pain near the anus) TENDERNESS IN MOUTH AND THROAT WITH OR WITHOUT PRESENCE OF ULCERS (sore throat, sores in mouth, or a toothache) UNUSUAL RASH, SWELLING OR PAIN  UNUSUAL VAGINAL DISCHARGE OR ITCHING   Items with * indicate a potential emergency and should be followed up as soon as possible or go to the Emergency Department if any problems should occur.  Please show the CHEMOTHERAPY ALERT CARD or IMMUNOTHERAPY ALERT CARD at check-in to the Emergency Department and triage nurse.  Should you have questions after your visit or need to cancel or reschedule your appointment, please contact [facility] [place] - A DEPT OF Eligha Bridegroom [facility] [phone]  and follow the prompts.  Office hours are 8:00 a.m. to 4:30 p.m. Monday - Friday. Please note that voicemails left after 4:00 p.m. may not be returned until the following business day.  We are closed weekends  and major holidays. You have access to a nurse at all times for urgent questions. Please call the main number to the clinic [phone] and follow the prompts.  For any non-urgent questions, you may also contact your provider using MyChart. We now offer e-Visits for anyone 67 and older to request care online for non-urgent symptoms. For details visit [email or web address].   Also download the MyChart app! Go to the app store, search "MyChart", open the app, select [place], and log in with your MyChart username and password.

## 2024-05-18 NOTE — Progress Notes (Signed)
 Patient presents today for Feraheme infusion.  Vital signs stable. Patient has complaints of fatigue not relieved with rest. Patient has no complaints of side effects related to last iron infusion.   Feraheme given today per MD orders. Tolerated infusion without adverse affects. Vital signs stable. No complaints at this time. Discharged from clinic ambulatory in stable condition. Alert and oriented x 3. F/U with [facility] as scheduled.

## 2024-05-30 DIAGNOSIS — K2100 Gastro-esophageal reflux disease with esophagitis, without bleeding: Secondary | ICD-10-CM | POA: Diagnosis not present

## 2024-05-30 DIAGNOSIS — D509 Iron deficiency anemia, unspecified: Secondary | ICD-10-CM | POA: Diagnosis not present

## 2024-05-30 DIAGNOSIS — E1122 Type 2 diabetes mellitus with diabetic chronic kidney disease: Secondary | ICD-10-CM | POA: Diagnosis not present

## 2024-05-30 DIAGNOSIS — I10 Essential (primary) hypertension: Secondary | ICD-10-CM | POA: Diagnosis not present

## 2024-05-30 DIAGNOSIS — Z96641 Presence of right artificial hip joint: Secondary | ICD-10-CM | POA: Diagnosis not present

## 2024-05-30 DIAGNOSIS — I5020 Unspecified systolic (congestive) heart failure: Secondary | ICD-10-CM | POA: Diagnosis not present

## 2024-05-30 DIAGNOSIS — E782 Mixed hyperlipidemia: Secondary | ICD-10-CM | POA: Diagnosis not present

## 2024-05-30 DIAGNOSIS — R413 Other amnesia: Secondary | ICD-10-CM | POA: Diagnosis not present

## 2024-05-30 DIAGNOSIS — F331 Major depressive disorder, recurrent, moderate: Secondary | ICD-10-CM | POA: Diagnosis not present

## 2024-06-09 DIAGNOSIS — F331 Major depressive disorder, recurrent, moderate: Secondary | ICD-10-CM | POA: Diagnosis not present

## 2024-06-09 DIAGNOSIS — I10 Essential (primary) hypertension: Secondary | ICD-10-CM | POA: Diagnosis not present

## 2024-06-09 DIAGNOSIS — E782 Mixed hyperlipidemia: Secondary | ICD-10-CM | POA: Diagnosis not present

## 2024-06-09 DIAGNOSIS — E1122 Type 2 diabetes mellitus with diabetic chronic kidney disease: Secondary | ICD-10-CM | POA: Diagnosis not present

## 2024-06-19 ENCOUNTER — Inpatient Hospital Stay: Attending: Hematology

## 2024-06-19 VITALS — BP 126/77 | HR 60 | Temp 97.6°F

## 2024-06-19 DIAGNOSIS — E538 Deficiency of other specified B group vitamins: Secondary | ICD-10-CM | POA: Insufficient documentation

## 2024-06-19 DIAGNOSIS — D509 Iron deficiency anemia, unspecified: Secondary | ICD-10-CM

## 2024-06-19 MED ORDER — CYANOCOBALAMIN 1000 MCG/ML IJ SOLN
1000.0000 ug | Freq: Once | INTRAMUSCULAR | Status: AC
  Administered 2024-06-19: 1000 ug via INTRAMUSCULAR
  Filled 2024-06-19: qty 1

## 2024-06-19 NOTE — Patient Instructions (Signed)

## 2024-06-19 NOTE — Progress Notes (Signed)
 Patient tolerated injection with no complaints voiced.  Site clean and dry with no bruising or swelling noted at site.  See MAR for details.  Band aid applied.  Patient stable during and after injection.  Vss with discharge and left in satisfactory condition with no s/s of distress noted.

## 2024-06-20 DIAGNOSIS — E1122 Type 2 diabetes mellitus with diabetic chronic kidney disease: Secondary | ICD-10-CM | POA: Diagnosis not present

## 2024-06-20 DIAGNOSIS — K2100 Gastro-esophageal reflux disease with esophagitis, without bleeding: Secondary | ICD-10-CM | POA: Diagnosis not present

## 2024-06-20 DIAGNOSIS — Z96641 Presence of right artificial hip joint: Secondary | ICD-10-CM | POA: Diagnosis not present

## 2024-06-20 DIAGNOSIS — E782 Mixed hyperlipidemia: Secondary | ICD-10-CM | POA: Diagnosis not present

## 2024-06-20 DIAGNOSIS — R413 Other amnesia: Secondary | ICD-10-CM | POA: Diagnosis not present

## 2024-06-20 DIAGNOSIS — I5020 Unspecified systolic (congestive) heart failure: Secondary | ICD-10-CM | POA: Diagnosis not present

## 2024-06-20 DIAGNOSIS — D509 Iron deficiency anemia, unspecified: Secondary | ICD-10-CM | POA: Diagnosis not present

## 2024-06-20 DIAGNOSIS — G629 Polyneuropathy, unspecified: Secondary | ICD-10-CM | POA: Diagnosis not present

## 2024-06-20 DIAGNOSIS — I10 Essential (primary) hypertension: Secondary | ICD-10-CM | POA: Diagnosis not present

## 2024-06-20 DIAGNOSIS — F331 Major depressive disorder, recurrent, moderate: Secondary | ICD-10-CM | POA: Diagnosis not present

## 2024-07-03 DIAGNOSIS — F331 Major depressive disorder, recurrent, moderate: Secondary | ICD-10-CM | POA: Diagnosis not present

## 2024-07-03 DIAGNOSIS — E11649 Type 2 diabetes mellitus with hypoglycemia without coma: Secondary | ICD-10-CM | POA: Diagnosis not present

## 2024-07-03 DIAGNOSIS — R410 Disorientation, unspecified: Secondary | ICD-10-CM | POA: Diagnosis not present

## 2024-07-04 DIAGNOSIS — Z6824 Body mass index (BMI) 24.0-24.9, adult: Secondary | ICD-10-CM | POA: Diagnosis not present

## 2024-07-04 DIAGNOSIS — E1122 Type 2 diabetes mellitus with diabetic chronic kidney disease: Secondary | ICD-10-CM | POA: Diagnosis not present

## 2024-07-04 DIAGNOSIS — I10 Essential (primary) hypertension: Secondary | ICD-10-CM | POA: Diagnosis not present

## 2024-07-04 DIAGNOSIS — Z1329 Encounter for screening for other suspected endocrine disorder: Secondary | ICD-10-CM | POA: Diagnosis not present

## 2024-07-04 DIAGNOSIS — E782 Mixed hyperlipidemia: Secondary | ICD-10-CM | POA: Diagnosis not present

## 2024-07-04 DIAGNOSIS — I5020 Unspecified systolic (congestive) heart failure: Secondary | ICD-10-CM | POA: Diagnosis not present

## 2024-07-05 DIAGNOSIS — G629 Polyneuropathy, unspecified: Secondary | ICD-10-CM | POA: Diagnosis not present

## 2024-07-05 DIAGNOSIS — F331 Major depressive disorder, recurrent, moderate: Secondary | ICD-10-CM | POA: Diagnosis not present

## 2024-07-05 DIAGNOSIS — K2100 Gastro-esophageal reflux disease with esophagitis, without bleeding: Secondary | ICD-10-CM | POA: Diagnosis not present

## 2024-07-05 DIAGNOSIS — E782 Mixed hyperlipidemia: Secondary | ICD-10-CM | POA: Diagnosis not present

## 2024-07-05 DIAGNOSIS — I10 Essential (primary) hypertension: Secondary | ICD-10-CM | POA: Diagnosis not present

## 2024-07-05 DIAGNOSIS — I5020 Unspecified systolic (congestive) heart failure: Secondary | ICD-10-CM | POA: Diagnosis not present

## 2024-07-05 DIAGNOSIS — D509 Iron deficiency anemia, unspecified: Secondary | ICD-10-CM | POA: Diagnosis not present

## 2024-07-05 DIAGNOSIS — Z96641 Presence of right artificial hip joint: Secondary | ICD-10-CM | POA: Diagnosis not present

## 2024-07-05 DIAGNOSIS — E1122 Type 2 diabetes mellitus with diabetic chronic kidney disease: Secondary | ICD-10-CM | POA: Diagnosis not present

## 2024-07-05 DIAGNOSIS — R413 Other amnesia: Secondary | ICD-10-CM | POA: Diagnosis not present

## 2024-07-06 ENCOUNTER — Other Ambulatory Visit (HOSPITAL_COMMUNITY): Payer: Self-pay | Admitting: Physician Assistant

## 2024-07-10 DIAGNOSIS — E782 Mixed hyperlipidemia: Secondary | ICD-10-CM | POA: Diagnosis not present

## 2024-07-10 DIAGNOSIS — E11649 Type 2 diabetes mellitus with hypoglycemia without coma: Secondary | ICD-10-CM | POA: Diagnosis not present

## 2024-07-10 DIAGNOSIS — I10 Essential (primary) hypertension: Secondary | ICD-10-CM | POA: Diagnosis not present

## 2024-07-10 DIAGNOSIS — F331 Major depressive disorder, recurrent, moderate: Secondary | ICD-10-CM | POA: Diagnosis not present

## 2024-07-11 DIAGNOSIS — E1122 Type 2 diabetes mellitus with diabetic chronic kidney disease: Secondary | ICD-10-CM | POA: Diagnosis not present

## 2024-07-11 DIAGNOSIS — F331 Major depressive disorder, recurrent, moderate: Secondary | ICD-10-CM | POA: Diagnosis not present

## 2024-07-11 DIAGNOSIS — R413 Other amnesia: Secondary | ICD-10-CM | POA: Diagnosis not present

## 2024-07-11 DIAGNOSIS — Z96641 Presence of right artificial hip joint: Secondary | ICD-10-CM | POA: Diagnosis not present

## 2024-07-11 DIAGNOSIS — I10 Essential (primary) hypertension: Secondary | ICD-10-CM | POA: Diagnosis not present

## 2024-07-11 DIAGNOSIS — E782 Mixed hyperlipidemia: Secondary | ICD-10-CM | POA: Diagnosis not present

## 2024-07-11 DIAGNOSIS — I5020 Unspecified systolic (congestive) heart failure: Secondary | ICD-10-CM | POA: Diagnosis not present

## 2024-07-11 DIAGNOSIS — K2100 Gastro-esophageal reflux disease with esophagitis, without bleeding: Secondary | ICD-10-CM | POA: Diagnosis not present

## 2024-07-11 DIAGNOSIS — D509 Iron deficiency anemia, unspecified: Secondary | ICD-10-CM | POA: Diagnosis not present

## 2024-07-11 DIAGNOSIS — G629 Polyneuropathy, unspecified: Secondary | ICD-10-CM | POA: Diagnosis not present

## 2024-07-19 ENCOUNTER — Inpatient Hospital Stay: Attending: Hematology

## 2024-07-19 VITALS — BP 108/72 | HR 71 | Temp 97.8°F | Resp 18

## 2024-07-19 DIAGNOSIS — R413 Other amnesia: Secondary | ICD-10-CM | POA: Diagnosis not present

## 2024-07-19 DIAGNOSIS — E538 Deficiency of other specified B group vitamins: Secondary | ICD-10-CM | POA: Insufficient documentation

## 2024-07-19 DIAGNOSIS — Z96641 Presence of right artificial hip joint: Secondary | ICD-10-CM | POA: Diagnosis not present

## 2024-07-19 DIAGNOSIS — D509 Iron deficiency anemia, unspecified: Secondary | ICD-10-CM | POA: Diagnosis not present

## 2024-07-19 DIAGNOSIS — F331 Major depressive disorder, recurrent, moderate: Secondary | ICD-10-CM | POA: Diagnosis not present

## 2024-07-19 DIAGNOSIS — E782 Mixed hyperlipidemia: Secondary | ICD-10-CM | POA: Diagnosis not present

## 2024-07-19 DIAGNOSIS — I10 Essential (primary) hypertension: Secondary | ICD-10-CM | POA: Diagnosis not present

## 2024-07-19 DIAGNOSIS — E1122 Type 2 diabetes mellitus with diabetic chronic kidney disease: Secondary | ICD-10-CM | POA: Diagnosis not present

## 2024-07-19 DIAGNOSIS — G629 Polyneuropathy, unspecified: Secondary | ICD-10-CM | POA: Diagnosis not present

## 2024-07-19 DIAGNOSIS — K2100 Gastro-esophageal reflux disease with esophagitis, without bleeding: Secondary | ICD-10-CM | POA: Diagnosis not present

## 2024-07-19 DIAGNOSIS — I5020 Unspecified systolic (congestive) heart failure: Secondary | ICD-10-CM | POA: Diagnosis not present

## 2024-07-19 MED ORDER — CYANOCOBALAMIN 1000 MCG/ML IJ SOLN
1000.0000 ug | Freq: Once | INTRAMUSCULAR | Status: AC
  Administered 2024-07-19: 1000 ug via INTRAMUSCULAR
  Filled 2024-07-19: qty 1

## 2024-07-19 NOTE — Patient Instructions (Signed)
 [facility] [place] [facility]  Discharge Instructions: Thank you for choosing [facility] to provide your oncology and hematology care.  If you have a lab appointment with the Cancer Center - please note that after April 8th, 2024, all labs will be drawn in the cancer center.  You do not have to check in or register with the main entrance as you have in the past but will complete your check-in in the cancer center.  Wear comfortable clothing and clothing appropriate for easy access to any Portacath or PICC line.   We strive to give you quality time with your provider. You may need to reschedule your appointment if you arrive late (15 or more minutes).  Arriving late affects you and other patients whose appointments are after yours.  Also, if you miss three or more appointments without notifying the office, you may be dismissed from the clinic at the provider's discretion.      For prescription refill requests, have your pharmacy contact our office and allow 72 hours for refills to be completed.    Today you received B12 injection     BELOW ARE SYMPTOMS THAT SHOULD BE REPORTED IMMEDIATELY: *FEVER GREATER THAN 100.4 F (38 C) OR HIGHER *CHILLS OR SWEATING *NAUSEA AND VOMITING THAT IS NOT CONTROLLED WITH YOUR NAUSEA MEDICATION *UNUSUAL SHORTNESS OF BREATH *UNUSUAL BRUISING OR BLEEDING *URINARY PROBLEMS (pain or burning when urinating, or frequent urination) *BOWEL PROBLEMS (unusual diarrhea, constipation, pain near the anus) TENDERNESS IN MOUTH AND THROAT WITH OR WITHOUT PRESENCE OF ULCERS (sore throat, sores in mouth, or a toothache) UNUSUAL RASH, SWELLING OR PAIN  UNUSUAL VAGINAL DISCHARGE OR ITCHING   Items with * indicate a potential emergency and should be followed up as soon as possible or go to the Emergency Department if any problems should occur.  Please show the CHEMOTHERAPY ALERT CARD or IMMUNOTHERAPY ALERT CARD at check-in to  the Emergency Department and triage nurse.  Should you have questions after your visit or need to cancel or reschedule your appointment, please contact [facility] [place] - A DEPT OF [facility]ide[facility] [phone]  and follow the prompts.  Office hours are 8:00 a.m. to 4:30 p.m. Monday - Friday. Please note that voicemails left after 4:00 p.m. may not be returned until the following business day.  We are closed weekends and major holidays. You have access to a nurse at all times for urgent questions. Please call the main number to the clinic [phone] and follow the prompts.  For any non-urgent questions, you may also contact your provider using MyChart. We now offer e-Visits for anyone 57 and older to request care online for non-urgent symptoms. For details visit [email or web address].   Also download the MyChart app! Go to the app store, search "MyChart", open the app, select [place], and log in with your MyChart username and password.

## 2024-07-19 NOTE — Progress Notes (Signed)
 Erica Cervantes presents today for injection per the provider's orders.  B12 administration without incident; injection site WNL; see MAR for injection details.  Patient tolerated procedure well and without incident.  No questions or complaints noted at this time.   Treatment given today per MD orders. Tolerated infusion without adverse affects. Vital signs stable. No complaints at this time. Discharged from clinic ambulatory in stable condition. Alert and oriented x 3. F/U with [facility] as scheduled.

## 2024-07-24 ENCOUNTER — Emergency Department (HOSPITAL_COMMUNITY)

## 2024-07-24 ENCOUNTER — Other Ambulatory Visit: Payer: Self-pay

## 2024-07-24 ENCOUNTER — Encounter (HOSPITAL_COMMUNITY): Payer: Self-pay

## 2024-07-24 ENCOUNTER — Observation Stay (HOSPITAL_COMMUNITY)
Admission: EM | Admit: 2024-07-24 | Discharge: 2024-07-26 | Disposition: A | Discharge status: Home Health | Attending: Internal Medicine | Admitting: Internal Medicine

## 2024-07-24 DIAGNOSIS — K219 Gastro-esophageal reflux disease without esophagitis: Secondary | ICD-10-CM | POA: Diagnosis not present

## 2024-07-24 DIAGNOSIS — I130 Hypertensive heart and chronic kidney disease with heart failure and stage 1 through stage 4 chronic kidney disease, or unspecified chronic kidney disease: Secondary | ICD-10-CM | POA: Insufficient documentation

## 2024-07-24 DIAGNOSIS — E1169 Type 2 diabetes mellitus with other specified complication: Secondary | ICD-10-CM

## 2024-07-24 DIAGNOSIS — R55 Syncope and collapse: Secondary | ICD-10-CM | POA: Diagnosis not present

## 2024-07-24 DIAGNOSIS — R519 Headache, unspecified: Secondary | ICD-10-CM | POA: Diagnosis not present

## 2024-07-24 DIAGNOSIS — S2249XA Multiple fractures of ribs, unspecified side, initial encounter for closed fracture: Secondary | ICD-10-CM

## 2024-07-24 DIAGNOSIS — I5022 Chronic systolic (congestive) heart failure: Secondary | ICD-10-CM | POA: Diagnosis not present

## 2024-07-24 DIAGNOSIS — E1142 Type 2 diabetes mellitus with diabetic polyneuropathy: Secondary | ICD-10-CM | POA: Insufficient documentation

## 2024-07-24 DIAGNOSIS — R2689 Other abnormalities of gait and mobility: Secondary | ICD-10-CM | POA: Insufficient documentation

## 2024-07-24 DIAGNOSIS — Z79899 Other long term (current) drug therapy: Secondary | ICD-10-CM | POA: Diagnosis not present

## 2024-07-24 DIAGNOSIS — T07XXXA Unspecified multiple injuries, initial encounter: Secondary | ICD-10-CM | POA: Diagnosis not present

## 2024-07-24 DIAGNOSIS — E785 Hyperlipidemia, unspecified: Secondary | ICD-10-CM | POA: Diagnosis not present

## 2024-07-24 DIAGNOSIS — Z7982 Long term (current) use of aspirin: Secondary | ICD-10-CM | POA: Diagnosis not present

## 2024-07-24 DIAGNOSIS — W19XXXA Unspecified fall, initial encounter: Secondary | ICD-10-CM | POA: Insufficient documentation

## 2024-07-24 DIAGNOSIS — M4802 Spinal stenosis, cervical region: Secondary | ICD-10-CM | POA: Diagnosis not present

## 2024-07-24 DIAGNOSIS — I700 Atherosclerosis of aorta: Secondary | ICD-10-CM | POA: Diagnosis not present

## 2024-07-24 DIAGNOSIS — L989 Disorder of the skin and subcutaneous tissue, unspecified: Secondary | ICD-10-CM | POA: Insufficient documentation

## 2024-07-24 DIAGNOSIS — M549 Dorsalgia, unspecified: Secondary | ICD-10-CM | POA: Diagnosis not present

## 2024-07-24 DIAGNOSIS — F32A Depression, unspecified: Secondary | ICD-10-CM | POA: Insufficient documentation

## 2024-07-24 DIAGNOSIS — S0990XA Unspecified injury of head, initial encounter: Secondary | ICD-10-CM | POA: Diagnosis not present

## 2024-07-24 DIAGNOSIS — S199XXA Unspecified injury of neck, initial encounter: Secondary | ICD-10-CM | POA: Diagnosis not present

## 2024-07-24 DIAGNOSIS — N1832 Chronic kidney disease, stage 3b: Secondary | ICD-10-CM | POA: Diagnosis not present

## 2024-07-24 DIAGNOSIS — E1122 Type 2 diabetes mellitus with diabetic chronic kidney disease: Secondary | ICD-10-CM | POA: Diagnosis not present

## 2024-07-24 DIAGNOSIS — E1165 Type 2 diabetes mellitus with hyperglycemia: Secondary | ICD-10-CM | POA: Diagnosis not present

## 2024-07-24 DIAGNOSIS — I672 Cerebral atherosclerosis: Secondary | ICD-10-CM | POA: Diagnosis not present

## 2024-07-24 DIAGNOSIS — Z794 Long term (current) use of insulin: Secondary | ICD-10-CM | POA: Diagnosis not present

## 2024-07-24 DIAGNOSIS — S2242XA Multiple fractures of ribs, left side, initial encounter for closed fracture: Secondary | ICD-10-CM | POA: Insufficient documentation

## 2024-07-24 DIAGNOSIS — M47812 Spondylosis without myelopathy or radiculopathy, cervical region: Secondary | ICD-10-CM | POA: Diagnosis not present

## 2024-07-24 DIAGNOSIS — G3184 Mild cognitive impairment, so stated: Secondary | ICD-10-CM | POA: Diagnosis not present

## 2024-07-24 DIAGNOSIS — N179 Acute kidney failure, unspecified: Secondary | ICD-10-CM | POA: Diagnosis not present

## 2024-07-24 DIAGNOSIS — R0602 Shortness of breath: Secondary | ICD-10-CM | POA: Diagnosis not present

## 2024-07-24 DIAGNOSIS — E119 Type 2 diabetes mellitus without complications: Secondary | ICD-10-CM

## 2024-07-24 LAB — COMPREHENSIVE METABOLIC PANEL WITH GFR
ALT: 75 U/L — ABNORMAL HIGH (ref 0–44)
AST: 35 U/L (ref 15–41)
Albumin: 3.5 g/dL (ref 3.5–5.0)
Alkaline Phosphatase: 120 U/L (ref 38–126)
Anion gap: 12 (ref 5–15)
BUN: 56 mg/dL — ABNORMAL HIGH (ref 8–23)
CO2: 22 mmol/L (ref 22–32)
Calcium: 9.9 mg/dL (ref 8.9–10.3)
Chloride: 97 mmol/L — ABNORMAL LOW (ref 98–111)
Creatinine, Ser: 1.44 mg/dL — ABNORMAL HIGH (ref 0.44–1.00)
GFR, Estimated: 37 mL/min — ABNORMAL LOW (ref >60)
Glucose, Bld: 244 mg/dL — ABNORMAL HIGH (ref 70–99)
Potassium: 3.7 mmol/L (ref 3.5–5.1)
Sodium: 131 mmol/L — ABNORMAL LOW (ref 135–145)
Total Bilirubin: 0.7 mg/dL (ref 0.0–1.2)
Total Protein: 6.8 g/dL (ref 6.5–8.1)

## 2024-07-24 LAB — CBC
HCT: 38.8 % (ref 36.0–46.0)
Hemoglobin: 12.6 g/dL (ref 12.0–15.0)
MCH: 30.3 pg (ref 26.0–34.0)
MCHC: 32.5 g/dL (ref 30.0–36.0)
MCV: 93.3 fL (ref 80.0–100.0)
Platelets: 296 K/uL (ref 150–400)
RBC: 4.16 MIL/uL (ref 3.87–5.11)
RDW: 13.3 % (ref 11.5–15.5)
WBC: 10 K/uL (ref 4.0–10.5)
nRBC: 0 % (ref 0.0–0.2)

## 2024-07-24 LAB — CBC WITH DIFFERENTIAL/PLATELET
Abs Immature Granulocytes: 0.06 K/uL (ref 0.00–0.07)
Basophils Absolute: 0.1 K/uL (ref 0.0–0.1)
Basophils Relative: 1 %
Eosinophils Absolute: 0.2 K/uL (ref 0.0–0.5)
Eosinophils Relative: 1 %
HCT: 42.5 % (ref 36.0–46.0)
Hemoglobin: 13.6 g/dL (ref 12.0–15.0)
Immature Granulocytes: 1 %
Lymphocytes Relative: 17 %
Lymphs Abs: 2.3 K/uL (ref 0.7–4.0)
MCH: 31.1 pg (ref 26.0–34.0)
MCHC: 32 g/dL (ref 30.0–36.0)
MCV: 97 fL (ref 80.0–100.0)
Monocytes Absolute: 1.1 K/uL — ABNORMAL HIGH (ref 0.1–1.0)
Monocytes Relative: 8 %
Neutro Abs: 9.5 K/uL — ABNORMAL HIGH (ref 1.7–7.7)
Neutrophils Relative %: 72 %
Platelets: 348 K/uL (ref 150–400)
RBC: 4.38 MIL/uL (ref 3.87–5.11)
RDW: 13.5 % (ref 11.5–15.5)
WBC: 13.2 K/uL — ABNORMAL HIGH (ref 4.0–10.5)
nRBC: 0 % (ref 0.0–0.2)

## 2024-07-24 LAB — I-STAT CHEM 8, ED
BUN: 56 mg/dL — ABNORMAL HIGH (ref 8–23)
Calcium, Ion: 1.28 mmol/L (ref 1.15–1.40)
Chloride: 101 mmol/L (ref 98–111)
Creatinine, Ser: 1.6 mg/dL — ABNORMAL HIGH (ref 0.44–1.00)
Glucose, Bld: 234 mg/dL — ABNORMAL HIGH (ref 70–99)
HCT: 43 % (ref 36.0–46.0)
Hemoglobin: 14.6 g/dL (ref 12.0–15.0)
Potassium: 3.8 mmol/L (ref 3.5–5.1)
Sodium: 135 mmol/L (ref 135–145)
TCO2: 23 mmol/L (ref 22–32)

## 2024-07-24 LAB — URINALYSIS, ROUTINE W REFLEX MICROSCOPIC
Bilirubin Urine: NEGATIVE
Glucose, UA: 50 mg/dL — AB
Hgb urine dipstick: NEGATIVE
Ketones, ur: NEGATIVE mg/dL
Leukocytes,Ua: NEGATIVE
Nitrite: NEGATIVE
Protein, ur: NEGATIVE mg/dL
Specific Gravity, Urine: 1.013 (ref 1.005–1.030)
pH: 5 (ref 5.0–8.0)

## 2024-07-24 LAB — TROPONIN I (HIGH SENSITIVITY)
Troponin I (High Sensitivity): 3 ng/L (ref <18)
Troponin I (High Sensitivity): 4 ng/L (ref <18)

## 2024-07-24 MED ORDER — DULOXETINE HCL 30 MG PO CPEP
120.0000 mg | ORAL_CAPSULE | Freq: Every day | ORAL | Status: DC
  Administered 2024-07-25: 120 mg via ORAL
  Filled 2024-07-24: qty 4

## 2024-07-24 MED ORDER — ATORVASTATIN CALCIUM 10 MG PO TABS
10.0000 mg | ORAL_TABLET | ORAL | Status: DC
  Administered 2024-07-26: 10 mg via ORAL
  Filled 2024-07-24: qty 1

## 2024-07-24 MED ORDER — AMPHETAMINE-DEXTROAMPHETAMINE 10 MG PO TABS
40.0000 mg | ORAL_TABLET | Freq: Every morning | ORAL | Status: DC
  Administered 2024-07-25 – 2024-07-26 (×2): 40 mg via ORAL
  Filled 2024-07-24 (×2): qty 4

## 2024-07-24 MED ORDER — PANTOPRAZOLE SODIUM 40 MG PO TBEC
40.0000 mg | DELAYED_RELEASE_TABLET | Freq: Every day | ORAL | Status: DC
  Administered 2024-07-25 – 2024-07-26 (×2): 40 mg via ORAL
  Filled 2024-07-24 (×2): qty 1

## 2024-07-24 MED ORDER — ACETAMINOPHEN 650 MG RE SUPP: 650.0000 mg | Freq: Four times a day (QID) | RECTAL | Status: DC | PRN

## 2024-07-24 MED ORDER — SACUBITRIL-VALSARTAN 97-103 MG PO TABS
1.0000 | ORAL_TABLET | Freq: Two times a day (BID) | ORAL | Status: DC
  Administered 2024-07-24 – 2024-07-25 (×2): 1 via ORAL
  Filled 2024-07-24 (×3): qty 1

## 2024-07-24 MED ORDER — IOHEXOL 350 MG/ML SOLN
75.0000 mL | Freq: Once | INTRAVENOUS | Status: AC | PRN
  Administered 2024-07-24: 75 mL via INTRAVENOUS

## 2024-07-24 MED ORDER — ENOXAPARIN SODIUM 40 MG/0.4ML IJ SOSY
40.0000 mg | PREFILLED_SYRINGE | INTRAMUSCULAR | Status: DC
  Administered 2024-07-25 – 2024-07-26 (×2): 40 mg via SUBCUTANEOUS
  Filled 2024-07-24 (×2): qty 0.4

## 2024-07-24 MED ORDER — METOPROLOL SUCCINATE ER 25 MG PO TB24
25.0000 mg | ORAL_TABLET | Freq: Every day | ORAL | Status: DC
Start: 2024-07-25 — End: 2024-07-26
  Filled 2024-07-24: qty 1

## 2024-07-24 MED ORDER — ACETAMINOPHEN 500 MG PO TABS
1000.0000 mg | ORAL_TABLET | Freq: Once | ORAL | Status: AC
Start: 2024-07-24 — End: 2024-07-24
  Administered 2024-07-24: 1000 mg via ORAL
  Filled 2024-07-24: qty 2

## 2024-07-24 MED ORDER — LACTATED RINGERS IV BOLUS
250.0000 mL | Freq: Once | INTRAVENOUS | Status: AC
  Administered 2024-07-24: 250 mL via INTRAVENOUS

## 2024-07-24 MED ORDER — DIGOXIN 125 MCG PO TABS
0.0625 mg | ORAL_TABLET | Freq: Every day | ORAL | Status: DC
  Filled 2024-07-24: qty 0.5

## 2024-07-24 MED ORDER — ASPIRIN 81 MG PO TBEC
81.0000 mg | DELAYED_RELEASE_TABLET | Freq: Every day | ORAL | Status: DC
  Administered 2024-07-25 – 2024-07-26 (×2): 81 mg via ORAL
  Filled 2024-07-24 (×2): qty 1

## 2024-07-24 MED ORDER — INSULIN ASPART 100 UNIT/ML IJ SOLN
0.0000 [IU] | Freq: Three times a day (TID) | INTRAMUSCULAR | Status: DC
  Administered 2024-07-25: 3 [IU] via SUBCUTANEOUS
  Administered 2024-07-25: 1 [IU] via SUBCUTANEOUS
  Administered 2024-07-25 – 2024-07-26 (×2): 2 [IU] via SUBCUTANEOUS

## 2024-07-24 MED ORDER — AMPHETAMINE-DEXTROAMPHETAMINE 10 MG PO TABS
20.0000 mg | ORAL_TABLET | Freq: Every day | ORAL | Status: DC
Start: 2024-07-25 — End: 2024-07-26
  Administered 2024-07-25: 20 mg via ORAL
  Filled 2024-07-24: qty 2

## 2024-07-24 MED ORDER — AMPHETAMINE-DEXTROAMPHETAMINE 20 MG PO TABS: 20.0000 mg | ORAL_TABLET | ORAL | Status: DC

## 2024-07-24 MED ORDER — INFLUENZA VAC SPLIT HIGH-DOSE 0.5 ML IM SUSY
0.5000 mL | PREFILLED_SYRINGE | INTRAMUSCULAR | Status: DC
  Filled 2024-07-24: qty 0.5

## 2024-07-24 MED ORDER — BUSPIRONE HCL 10 MG PO TABS
30.0000 mg | ORAL_TABLET | Freq: Three times a day (TID) | ORAL | Status: DC
  Administered 2024-07-24 – 2024-07-26 (×5): 30 mg via ORAL
  Filled 2024-07-24 (×5): qty 3

## 2024-07-24 MED ORDER — SPIRONOLACTONE 25 MG PO TABS
25.0000 mg | ORAL_TABLET | Freq: Every day | ORAL | Status: DC
  Administered 2024-07-25 – 2024-07-26 (×2): 25 mg via ORAL
  Filled 2024-07-24: qty 1
  Filled 2024-07-24: qty 2
  Filled 2024-07-24: qty 1

## 2024-07-24 MED ORDER — ACETAMINOPHEN 325 MG PO TABS
650.0000 mg | ORAL_TABLET | Freq: Four times a day (QID) | ORAL | Status: DC | PRN
  Administered 2024-07-25 (×2): 650 mg via ORAL
  Filled 2024-07-24 (×2): qty 2

## 2024-07-24 NOTE — ED Triage Notes (Signed)
 Pt states she passed out last night and the night before. She hit her head night before and left flank last night. 10/10 pain in L. Flank.

## 2024-07-24 NOTE — ED Provider Notes (Signed)
 [facility] Provider Note   CSN: [id number] Arrival date & time: 07/24/24  1414     Patient presents with: Loss of Consciousness, Fall, and Flank Pain   Erica Cervantes is a 78 y.o. female.  Patient is an 78 year old female with PMH of severe microcytic anemia from iron deficiency, CKD , HFrEF with EF ~40%, insulin -dependent diabetes, GERD, HTN, and peripheral neuropathy, presenting to the ED with chief complaint of recurrent falls both last night and the night prior.  Patient reports that over the last 2 nights, she has heard her doorbell ring, at which time she got up and ambulated to the door, resulting in sudden syncopal event without practicing symptoms.  Patient reports that following the first fall, she experienced some right hip discomfort and generalized headaches.  Last night, she following her second fall, she endorses some left-sided flank pain.  Patient denies any recent fevers, chills, nausea, vomiting, focal weakness/sensation changes, vision changes, neck pain, back pain, palpitations, changes in urination, or changes in bowel movements.  Of note, patient reports that she has trialed multiple diabetic medications recently that have dropped her glucose, notably including Mounjaro and SGLT2 medication.  Prior to Admission medications   Medication Sig Start Date End Date Taking? Authorizing Provider  amphetamine-dextroamphetamine (ADDERALL) 20 MG tablet Take 20-40 mg by mouth See admin instructions. Take 40 mg (2 tablets) by mouth in the morning and 20 mg (1 tablet) at 2 pm 10/26/17  Yes [provider]  aspirin  EC 81 MG tablet Take 1 tablet (81 mg total) by mouth daily. Swallow whole. 11/07/22  Yes Arrien, Elidia Sieving, MD  atorvastatin  (LIPITOR) 10 MG tablet Take 10 mg by mouth See admin instructions. Take 1 tablet by mouth every Sunday and Wednesday   Yes [provider]  busPIRone  (BUSPAR ) 30 MG tablet Take 30 mg by  mouth 3 (three) times daily. 08/23/17  Yes [provider]  cholecalciferol (VITAMIN D3) 25 MCG (1000 UNIT) tablet Take 2,000 Units by mouth daily.   Yes [provider]  cycloSPORINE (RESTASIS) 0.05 % ophthalmic emulsion Place 2 drops into both eyes 2 (two) times daily.   Yes [provider]  digoxin  (LANOXIN ) 0.125 MG tablet Take 0.5 tablets (0.0625 mg total) by mouth daily. 11/07/22 07/24/24 Yes Arrien, Elidia Sieving, MD  donepezil  (ARICEPT ) 5 MG tablet Take 5 mg by mouth at bedtime.   Yes [provider]  doxycycline  (VIBRA -TABS) 100 MG tablet Take 100 mg by mouth 2 (two) times daily. 06/19/24  Yes [provider]  DULoxetine  (CYMBALTA ) 60 MG capsule Take 120 mg by mouth daily at 12 noon. 09/25/17  Yes [provider]  ENTRESTO  97-103 MG take 1 tablet by mouth 2 (two) times daily. 07/06/24  Yes Colletta Manuelita Garre, PA-C  folic acid  (FOLVITE ) 800 MCG tablet Take 800-2,400 mcg by mouth daily.   Yes [provider]  furosemide  (LASIX ) 40 MG tablet Take 40 mg by mouth daily. 05/04/24  Yes [provider]  Guaifenesin (MUCINEX MAXIMUM STRENGTH) 1200 MG TB12 Take 1,200 mg by mouth daily as needed (severe congestion).   Yes [provider]  metformin (FORTAMET) 500 MG (OSM) 24 hr tablet Take 500 mg by mouth daily with breakfast.   Yes [provider]  metoprolol  succinate (TOPROL  XL) 25 MG 24 hr tablet Take 1 tablet (25 mg total) by mouth daily. 12/04/22  Yes [place], Harlene HERO, FNP  omeprazole (PRILOSEC) 20 MG capsule Take 20  mg by mouth daily.   Yes [provider]  spironolactone  (ALDACTONE ) 25 MG tablet Take 1 tablet (25 mg total) by mouth daily. 11/07/22 07/24/25 Yes Arrien, Elidia Sieving, MD  triamcinolone  cream (KENALOG ) 0.1 % Apply 1 application. topically 3 (three) times daily as needed for itching or rash. 12/29/21  Yes [provider]  gabapentin  (NEURONTIN ) 100 MG capsule Take 1 capsule  qhs x 1 week then increase to 2 capsules qhs. Patient not taking: Reported on 07/24/2024    [provider]  Lancets [facility] CATHRYNE PLUS [place]) MISC  12/02/23   [provider]  MOUNJARO 2.5 MG/0.5ML Pen  05/04/24   [provider]  [facility] VERIO test strip 1 each daily. 12/02/23   [provider]    Allergies: Sulfa antibiotics, Ace inhibitors, Codeine, Ezetimibe, Penicillins, Statins, Tramadol, and Neosporin [neomycin-bacitracin zn-polymyx]    Review of Systems  Genitourinary:  Positive for flank pain.    Updated Vital Signs BP (!) 154/73   Pulse (!) 50   Temp 97.8 F (36.6 C)   Resp 12   Ht 5' 3 (1.6 m)   Wt 67 kg   SpO2 100%   BMI 26.17 kg/m   Physical Exam Constitutional:      Appearance: Normal appearance.  HENT:     Head: Normocephalic and atraumatic.     Mouth/Throat:     Mouth: Mucous membranes are moist.     Pharynx: Oropharynx is clear.  Eyes:     Extraocular Movements: Extraocular movements intact.     Pupils: Pupils are equal, round, and reactive to light.  Cardiovascular:     Rate and Rhythm: Normal rate and regular rhythm.     Pulses: Normal pulses.     Heart sounds: Normal heart sounds.  Pulmonary:     Effort: Pulmonary effort is normal.     Breath sounds: Normal breath sounds.  Abdominal:     General: Abdomen is flat.     Palpations: Abdomen is soft.  Musculoskeletal:     Cervical back: Normal range of motion.  Skin:    General: Skin is warm and dry.     Capillary Refill: Capillary refill takes less than 2 seconds.  Neurological:     General: No focal deficit present.     Mental Status: She is alert.     (all labs ordered are listed, but only abnormal results are displayed) Labs Reviewed  COMPREHENSIVE METABOLIC PANEL WITH GFR - Abnormal; Notable for the following components:      Result Value   Sodium 131 (*)    Chloride 97 (*)    Glucose, Bld 244 (*)    BUN 56 (*)    Creatinine, Ser 1.44 (*)     ALT 75 (*)    GFR, Estimated 37 (*)    All other components within normal limits  CBC WITH DIFFERENTIAL/PLATELET - Abnormal; Notable for the following components:   WBC 13.2 (*)    Neutro Abs 9.5 (*)    Monocytes Absolute 1.1 (*)    All other components within normal limits  URINALYSIS, ROUTINE W REFLEX MICROSCOPIC - Abnormal; Notable for the following components:   Glucose, UA 50 (*)    All other components within normal limits  I-STAT CHEM 8, ED - Abnormal; Notable for the following components:   BUN 56 (*)    Creatinine, Ser 1.60 (*)    Glucose, Bld 234 (*)    All other components within normal limits  HEMOGLOBIN A1C  CBC  CREATININE, SERUM  MAGNESIUM   TSH  BASIC METABOLIC PANEL WITH GFR  CBC  DIGOXIN  LEVEL  TROPONIN I (HIGH SENSITIVITY)  TROPONIN I (HIGH SENSITIVITY)    EKG: EKG Interpretation Date/Time:  Monday July 24 2024 17:09:58 EDT Ventricular Rate:  59 PR Interval:  136 QRS Duration:  98 QT Interval:  380 QTC Calculation: 376 R Axis:   31  Text Interpretation: Sinus bradycardia Cannot rule out Anterior infarct , age undetermined Abnormal ECG When compared with ECG of 08-Dec-2023 10:10, PREVIOUS ECG IS PRESENT when compared to prior, similar appearance with slower rate No STEMI Confirmed by Ginger Barefoot ([id number]) on 07/24/2024 6:30:49 PM  Radiology: CT L-SPINE NO CHARGE Result Date: 07/24/2024 EXAM: CT OF THE LUMBAR SPINE WITHOUT CONTRAST 07/24/2024 08:23:40 PM TECHNIQUE: CT of the lumbar spine was performed without the administration of intravenous contrast. Multiplanar reformatted images are provided for review. Automated exposure control, iterative reconstruction, and/or weight based adjustment of the mA/kV was utilized to reduce the radiation dose to as low as reasonably achievable. COMPARISON: None available. CLINICAL HISTORY: Fall, Back pain FINDINGS: BONES AND ALIGNMENT: Normal vertebral body heights. No acute fracture or suspicious bone lesion. Grade  1 anterolisthesis of L5 on S1. DEGENERATIVE CHANGES: Moderate degenerative changes at L4-L5 and L5-S1. SOFT TISSUES: Evaluated on dedicated CT abdomen/pelvis. IMPRESSION: 1. No acute traumatic injury. 2. Degenerative changes of the lower lumbar spine. Electronically signed by: Pinkie Pebbles MD 07/24/2024 08:31 PM EDT RP Workstation: [id number]   CT T-SPINE NO CHARGE Result Date: 07/24/2024 EXAM: CT THORACIC SPINE WITHOUT CONTRAST 07/24/2024 08:22:53 PM TECHNIQUE: CT of the thoracic spine was performed without the administration of intravenous contrast. Multiplanar reformatted images are provided for review. Automated exposure control, iterative reconstruction, and/or weight based adjustment of the mA/kV was utilized to reduce the radiation dose to as low as reasonably achievable. COMPARISON: None available. CLINICAL HISTORY: Fall, Back pain FINDINGS: BONES AND ALIGNMENT: Normal vertebral body heights. No acute fracture or suspicious bone lesion. Normal alignment. Mild superior endplate changes at T2 and T3, chronic. DEGENERATIVE CHANGES: Mild to moderate degenerative changes of the mid to lower thoracic spine. SOFT TISSUES: Evaluated on dedicated CT chest. IMPRESSION: 1. No acute abnormality of the thoracic spine. Electronically signed by: Pinkie Pebbles MD 07/24/2024 08:29 PM EDT RP Workstation: [id number]   CT Head Wo Contrast Result Date: 07/24/2024 EXAM: CT HEAD WITHOUT CONTRAST 07/24/2024 07:03:19 PM TECHNIQUE: CT of the head was performed without the administration of intravenous contrast. Automated exposure control, iterative reconstruction, and/or weight based adjustment of the mA/kV was utilized to reduce the radiation dose to as low as reasonably achievable. COMPARISON: 03/04/2022 CLINICAL HISTORY: Head trauma, intracranial arterial injury suspected. Pt states she passed out last night and the night before. She hit her head night before and left flank last night. 10/10 pain in L. Flank.  FINDINGS: BRAIN AND VENTRICLES: No acute hemorrhage. No evidence of acute infarct. No hydrocephalus. No extra-axial collection. No mass effect or midline shift. Mild intracranial atherosclerosis. Subcortical and periventricular small vessel ischemic changes. ORBITS: No acute abnormality. SINUSES: No acute abnormality. SOFT TISSUES AND SKULL: No acute soft tissue abnormality. No skull fracture. IMPRESSION: 1. No acute intracranial abnormality. Electronically signed by: Pinkie Pebbles MD 07/24/2024 07:19 PM EDT RP Workstation: [id number]   CT CHEST ABDOMEN PELVIS W CONTRAST Result Date: 07/24/2024 EXAM: CT CHEST, ABDOMEN AND PELVIS WITH CONTRAST 07/24/2024 07:03:19 PM TECHNIQUE: CT of the chest, abdomen and pelvis was performed with the administration of 75 mL of iohexol  (OMNIPAQUE )  350 MG/ML injection. Multiplanar reformatted images are provided for review. Automated exposure control, iterative reconstruction, and/or weight based adjustment of the mA/kV was utilized to reduce the radiation dose to as low as reasonably achievable. COMPARISON: CT chest dated 10/29/2022. CLINICAL HISTORY: Polytrauma, blunt. Pt states she passed out last night and the night before. She hit her head night before and left flank last night. 10/10 pain in L. Flank. FINDINGS: CHEST: MEDIASTINUM AND LYMPH NODES: Heart and pericardium are unremarkable. The central airways are clear. No mediastinal, hilar or axillary lymphadenopathy. Thoracic aortic atherosclerosis. LUNGS AND PLEURA: No focal consolidation or pulmonary edema. No pleural effusion or pneumothorax. Mild subpleural scarring versus atelectasis in the lingula and bilateral lower lobes. ABDOMEN AND PELVIS: LIVER: The liver is unremarkable. GALLBLADDER AND BILE DUCTS: Gallbladder is unremarkable. No biliary ductal dilatation. SPLEEN: No acute abnormality. PANCREAS: No acute abnormality. ADRENAL GLANDS: No acute abnormality. KIDNEYS, URETERS AND BLADDER: No stones in the kidneys or  ureters. No hydronephrosis. No perinephric or periureteral stranding. Urinary bladder is unremarkable. GI AND BOWEL: Stomach demonstrates no acute abnormality. There is no bowel obstruction. Normal appendix (image 89). Mild sigmoid diverticulosis, without evidence of diverticulitis. REPRODUCTIVE ORGANS: Status post hysterectomy. PERITONEUM AND RETROPERITONEUM: No ascites. No free air. VASCULATURE: Aorta is normal in caliber. Atherosclerotic calcifications of the abdominal aorta and branch vessels, although patent. ABDOMINAL AND PELVIS LYMPH NODES: No lymphadenopathy. BONES AND SOFT TISSUES: Nondisplaced left lateral 6th through 8th rib fractures. Right hip arthroplasty, without evidence of complication. Degenerative changes of the visualized thoracolumbar spine most prominent at L5-S1. No focal soft tissue abnormality. IMPRESSION: 1. Nondisplaced left lateral 6th8th rib fractures. No pneumothorax. 2. No traumatic injury to the abdomen/pelvis. 3. Additional ancillary findings as above. Electronically signed by: Pinkie Pebbles MD 07/24/2024 07:18 PM EDT RP Workstation: [id number]   CT Cervical Spine Wo Contrast Result Date: 07/24/2024 CLINICAL DATA:  Neck trauma.  Fall last night with head injury. EXAM: CT CERVICAL SPINE WITHOUT CONTRAST TECHNIQUE: Multidetector CT imaging of the cervical spine was performed without intravenous contrast. Multiplanar CT image reconstructions were also generated. RADIATION DOSE REDUCTION: This exam was performed according to the departmental dose-optimization program which includes automated exposure control, adjustment of the mA and/or kV according to patient size and/or use of iterative reconstruction technique. COMPARISON:  Cervical spine radiographs 02/10/2021. FINDINGS: Alignment: Straightening with mild degenerative anterolisthesis at C5-6. Skull base and vertebrae: No evidence of acute cervical spine fracture or traumatic subluxation. Soft tissues and spinal canal: No  prevertebral fluid or swelling. No visible canal hematoma. Disc levels: Multilevel spondylosis with disc space narrowing, uncinate spurring and facet hypertrophy most advanced at C4-5, C5-6 and C6-7. No large disc herniation or high-grade foraminal narrowing demonstrated. Up to moderate foraminal narrowing at C5-6 and C6-7. Upper chest: Clear lung apices. Aortic and great vessel atherosclerosis. Other: Bilateral TMJ degenerative changes. Asymmetric degenerative changes at the left sternoclavicular joint with associated left clavicular head sclerosis. IMPRESSION: 1. No evidence of acute cervical spine fracture, traumatic subluxation or static signs of instability. 2. Multilevel cervical spondylosis as described. 3.  Aortic Atherosclerosis (ICD10-I70.0). Electronically Signed   By: Elsie Perone M.D.   On: 07/24/2024 19:17   DG Chest 2 View Result Date: 07/24/2024 EXAM: 2 VIEW(S) XRAY OF THE CHEST 07/24/2024 06:15:00 PM COMPARISON: CT chest 04/06/2023. CT chest 10/29/2022. CLINICAL HISTORY: fall. L side chest pain, sob, fall FINDINGS: LUNGS AND PLEURA: No focal pulmonary opacity. No pulmonary edema. No pleural effusion. No pneumothorax. HEART AND MEDIASTINUM: No acute abnormality  of the cardiac and mediastinal silhouettes. BONES AND SOFT TISSUES: Acute left sixth and seventh rib fractures. IMPRESSION: 1. Acute left sixth and seventh rib fractures Electronically signed by: Greig Pique MD 07/24/2024 06:35 PM EDT RP Workstation: [id number]     Procedures   Medications Ordered in the ED  aspirin  EC tablet 81 mg (has no administration in time range)  atorvastatin  (LIPITOR) tablet 10 mg (has no administration in time range)  digoxin  (LANOXIN ) tablet 0.0625 mg (has no administration in time range)  sacubitril -valsartan  (ENTRESTO ) 97-103 mg per tablet (has no administration in time range)  metoprolol  succinate (TOPROL -XL) 24 hr tablet 25 mg (has no administration in time range)  spironolactone  (ALDACTONE )  tablet 25 mg (has no administration in time range)  amphetamine-dextroamphetamine (ADDERALL) tablet 20-40 mg (has no administration in time range)  busPIRone  (BUSPAR ) tablet 30 mg (has no administration in time range)  DULoxetine  (CYMBALTA ) DR capsule 120 mg (has no administration in time range)  pantoprazole (PROTONIX) EC tablet 40 mg (has no administration in time range)  insulin  aspart (novoLOG ) injection 0-9 Units (has no administration in time range)  enoxaparin  (LOVENOX ) injection 40 mg (has no administration in time range)  acetaminophen  (TYLENOL ) tablet 650 mg (has no administration in time range)    Or  acetaminophen  (TYLENOL ) suppository 650 mg (has no administration in time range)  Influenza vac split trivalent PF (FLUZONE HIGH-DOSE) injection 0.5 mL (has no administration in time range)  iohexol  (OMNIPAQUE ) 350 MG/ML injection 75 mL (75 mLs Intravenous Contrast Given 07/24/24 1904)  lactated ringers  bolus 250 mL (250 mLs Intravenous New Bag/Given 07/24/24 2003)  acetaminophen  (TYLENOL ) tablet 1,000 mg (1,000 mg Oral Given 07/24/24 1956)    Clinical Course as of 07/24/24 2242  Mon Jul 24, 2024  1926  Nondisplaced left lateral 6th-8th rib fractures. No pneumothorax. [WB]    Clinical Course User Index [WB] Ko Bardon, Elsie, MD                                 Medical Decision Making Amount and/or Complexity of Data Reviewed Radiology: ordered.  Risk OTC drugs. Decision regarding hospitalization.  78 year old female with PMH as above presenting to the ED with chief complaint of left flank pain in the setting of recurrent syncopal episodes over the last 2 nights.  Patient describes events following transition from supine to standing, however denies precipitating presyncopal symptoms.  No recent infectious symptoms.  No N/V/D.  Upon arrival, patient hypertensive 154/73.  Afebrile.  No tachycardia or tachypnea.  Saturating 100% RA.  GCS 15.  CN II through XII intact.  Moving all  extremities with 5/5 strength.  Patient primarily endorsing left flank/lateral chest wall discomfort with tenderness to palpation. Patient given 1 g oral Tylenol  for symptomatic improvement/analgesia on reassessment.  CXR significant for nondisplaced left lateral 6th through 8th rib fractures.  No pneumothorax.  EKG reviewed indicative of sinus bradycardia 59 bpm.  No STEMI or acute ischemic changes.  Troponin 3, 4.  Patient denying any active chest pain or anginal equivalents.  Low suspicion for ACS at this time.  UA without evidence of acute infection.  CMP with creatinine 1.44 and eGFR 37, worsened from baseline, however Sub diagnostic for AKI.  With concern for mild dehydration, Patient given cautious 250 cc crystalloid bolus, with acknowledgment of reduced LV systolic function.  CBC without evidence of anemia.  At this time, patient appropriate for admission for high risk syncope.  Plan of care discussed with admitting team.  Final diagnosis : Cardiogenic syncope   Final diagnoses:  None    ED Discharge Orders     None          Ahni Bradwell, Elsie, MD 07/24/24 2243    Tegeler, Lonni PARAS, MD 07/24/24 2354

## 2024-07-24 NOTE — ED Triage Notes (Signed)
 Pt reports multiple syncopal episodes over the last few days. Multiple complaints. L rib pain. Recent diabetes med changes.

## 2024-07-24 NOTE — ED Provider Triage Note (Signed)
 Emergency Medicine Provider Triage Evaluation Note  Erica Cervantes , a 78 y.o. female  was evaluated in triage.  Pt complains of reports 2 syncopal episodes.  She reports that she has had 1 each night for the last 2 days.  She reports she does not know why she passed out and she woke up on the ground.  She does note she has had similar recent glycemic changes, she does have a history of diabetes.  She reports headache, left sided chest and abdomen pain.  She reports that her chest pain started after the fall.  Regardless we will check troponin, EKG chest x-ray.  I am also obtaining CT scan of head cervical spine chest abdomen and pelvis.  Review of Systems  Positive: Pain, LOC Negative: Fever  Physical Exam  BP 131/72 (BP Location: Left Arm)   Pulse (!) 56   Temp (!) 97.4 F (36.3 C)   Resp 17   Ht 5' 3 (1.6 m)   Wt 67 kg   SpO2 100%   BMI 26.17 kg/m  Gen:   Awake, no distress   Resp:  Normal effort  MSK:   Moves extremities without difficulty  Other:  Head appears atraumatic.  No cervical midline tenderness.  Significant tenderness to palpation to left sided chest wall, left upper abdomen and right hip.  Medical Decision Making  Medically screening exam initiated at 4:57 PM.  Appropriate orders placed.  Erica Cervantes was informed that the remainder of the evaluation will be completed by another provider, this initial triage assessment does not replace that evaluation, and the importance of remaining in the ED until their evaluation is complete.     Erica Warren SAILOR, PA-C 07/24/24 1700

## 2024-07-24 NOTE — H&P (Addendum)
 History and Physical    Erica Cervantes:[id number] DOB: [birth year] DOA: 07/24/2024  Patient coming from: Assisted living facility.  Chief Complaint: Loss of consciousness.  HPI: Erica Cervantes is a 78 y.o. female with history of chronic HFrEF, chronic kidney disease stage III, diabetes mellitus type 2, GERD, chronic pain/fibromyalgia, hypertension was brought to the ER after patient had syncopal episodes.  Patient states over the last 3 days patient had 2 syncopal episodes.  The first time was on Saturday, 11 October when was walking to open the door when she suddenly lost consciousness.  There was no prodrome and she fell onto the floor briefly and regained consciousness soon.  The same thing happened the following day again with a similar situation.  But the second time she did hit her head also her chest onto the vacuum cleaner.  Her chest has been hurting since and she presents to the ER.  Patient states over the last 1 week she stopped taking gabapentin  since patient was having nightmares.  Patient also was switched to metformin last 1 week has had some diarrhea from it.  Before that patient used to be on glipizide and Jardiance which was stopped.  Patient states her blood glucose has been poorly controlled.  ED Course: In the ER patient CT head chest abdomen pelvis and C-spine T-spine and L-spine shows fractures involving the left 6th through 8th rib.  EKG shows sinus bradycardia heart rate is around 59 bpm.  Labs show sodium 131 creatinine 1.4 glucose 244 WBC 13.2 hemoglobin 13.6 ALT 75.  Patient was given to 50 cc normal saline bolus with concern for possible dehydration admitted for syncope with no prodrome.  Review of Systems: As per HPI, rest all negative.   Past Medical History:  Diagnosis Date   Chronic kidney disease    stage 4   Depression    Diabetes mellitus without complication (HCC)    GERD (gastroesophageal reflux disease)    Hypertension    Neuromuscular disorder (HCC)     peripheral neuropathy    Past Surgical History:  Procedure Laterality Date   ABDOMINAL HYSTERECTOMY  17   CHOLECYSTECTOMY     age 11   DILATION AND CURETTAGE OF UTERUS     x 4  1980   RIGHT/LEFT HEART CATH AND CORONARY ANGIOGRAPHY N/A 10/30/2022   Procedure: RIGHT/LEFT HEART CATH AND CORONARY ANGIOGRAPHY;  Surgeon: Cherrie Toribio SAUNDERS, MD;  Location: [facility];  Service: Cardiovascular;  Laterality: N/A;   TONSILLECTOMY     age 20   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   Procedure: TOTAL HIP ARTHROPLASTY ANTERIOR APPROACH;  Surgeon: Yvone Rush, MD;  Location: [facility];  Service: Orthopedics;  Laterality: Right;     reports that she has never smoked. She has never been exposed to tobacco smoke. She has never used smokeless tobacco. She reports that she does not drink alcohol  and does not use drugs.  Allergies  Allergen Reactions   Sulfa Antibiotics Shortness Of Breath    Other Reaction(s): Not available   Ace Inhibitors Cough    Pt tolerates lisinopril    Codeine Itching and Swelling    Other Reaction(s): Not available   Ezetimibe     Myalgia  Other Reaction(s): Not available   Penicillins     Unknown reaction     Statins     Body aches  Other Reaction(s): Not available   Tramadol     Numbness  Other Reaction(s): Not available  Neosporin [Neomycin-Bacitracin Zn-Polymyx] Rash    Family History  Problem Relation Age of Onset   Cancer Mother    Cancer Father    Heart attack Brother    Polycystic kidney disease Brother    Cancer Other     Prior to Admission medications   Medication Sig Start Date End Date Taking? Authorizing Provider  amphetamine-dextroamphetamine (ADDERALL) 20 MG tablet Take 20-40 mg by mouth See admin instructions. Take 40 mg (2 tablets) by mouth in the morning and 20 mg (1 tablet) at 2 pm 10/26/17   [provider]  aspirin  EC 81 MG tablet Take 1 tablet (81 mg total) by mouth daily. Swallow whole. 11/07/22   Arrien,  Elidia Sieving, MD  atorvastatin  (LIPITOR) 10 MG tablet Take 10 mg by mouth every Wednesday.    [provider]  busPIRone  (BUSPAR ) 30 MG tablet Take 30 mg by mouth 3 (three) times daily. 08/23/17   [provider]  Calcium  Carb-Cholecalciferol (CALCIUM  500+D PO) Take 1 tablet by mouth daily.    [provider]  cholecalciferol (VITAMIN D3) 25 MCG (1000 UNIT) tablet Take 2,000 Units by mouth daily.    [provider]  cycloSPORINE (RESTASIS) 0.05 % ophthalmic emulsion Place 2 drops into both eyes 2 (two) times daily.    [provider]  digoxin  (LANOXIN ) 0.125 MG tablet Take 0.5 tablets (0.0625 mg total) by mouth daily. 11/07/22 07/19/24  Arrien, Elidia Sieving, MD  donepezil  (ARICEPT ) 5 MG tablet Take 5 mg by mouth at bedtime.    [provider]  DULoxetine  (CYMBALTA ) 60 MG capsule Take 120 mg by mouth daily at 12 noon. 09/25/17   [provider]  ENTRESTO  97-103 MG take 1 tablet by mouth 2 (two) times daily. 07/06/24   Colletta Manuelita Garre, PA-C  folic acid  (FOLVITE ) 800 MCG tablet Take 800-2,400 mcg by mouth daily.    [provider]  furosemide  (LASIX ) 40 MG tablet Take 40 mg by mouth daily. 05/04/24   [provider]  gabapentin  (NEURONTIN ) 100 MG capsule Take 1 capsule qhs x 1 week then increase to 2 capsules qhs.    [provider]  Guaifenesin (MUCINEX MAXIMUM STRENGTH) 1200 MG TB12 Take 1,200 mg by mouth daily as needed (severe congestion).    [provider]  Lancets [facility] DELICA PLUS [place]) MISC  12/02/23   [provider]  metoprolol  succinate (TOPROL  XL) 25 MG 24 hr tablet Take 1 tablet (25 mg total) by mouth daily. 12/04/22   Glena Harlene HERO, FNP  MOUNJARO 2.5 MG/0.5ML Pen  05/04/24   [provider]  Multiple Minerals-Vitamins (CAL MAG ZINC +D3) TABS Take 1 tablet by mouth daily.    [provider]  omeprazole (PRILOSEC) 20 MG capsule Take 20 mg by  mouth daily.    [provider]  [facility] VERIO test strip 1 each daily. 12/02/23   [provider]  Probiotic Product (PROBIOTIC PO) Take 1 capsule by mouth daily.    [provider]  spironolactone  (ALDACTONE ) 25 MG tablet Take 1 tablet (25 mg total) by mouth daily. 11/07/22 07/19/24  Arrien, Elidia Sieving, MD  triamcinolone  cream (KENALOG ) 0.1 % Apply 1 application. topically 3 (three) times daily as needed for itching or rash. 12/29/21   [provider]    Physical Exam: Constitutional: Moderately built and nourished. Vitals:   07/24/24 2100 07/24/24 2110 07/24/24 2113 07/24/24 2115  BP:    (!) 160/52  Pulse: (!) 59 61  (!) 59  Resp: 16 14  12   Temp:   97.7 F (36.5 C)   SpO2: 98% 100%  97%  Weight:      Height:       Eyes: Anicteric no pallor. ENMT: No discharge from the ears eyes nose and mouth. Neck: No mass felt.  No neck rigidity. Respiratory: No rhonchi or crepitations. Cardiovascular: S1 S2 heard. Abdomen: Soft nontender bowel sound present. Musculoskeletal: No edema. Skin: Multiple skin rash. Neurologic: Alert awake oriented to time place and person.  Moves all extremities. Psychiatric: Appears normal.  Normal affect.   Labs on Admission: I have personally reviewed following labs and imaging studies  CBC: Recent Labs  Lab 07/24/24 1640 07/24/24 1641  WBC 13.2*  --   NEUTROABS 9.5*  --   HGB 13.6 14.6  HCT 42.5 43.0  MCV 97.0  --   PLT 348  --    Basic Metabolic Panel: Recent Labs  Lab 07/24/24 1640 07/24/24 1641  NA 131* 135  K 3.7 3.8  CL 97* 101  CO2 22  --   GLUCOSE 244* 234*  BUN 56* 56*  CREATININE 1.44* 1.60*  CALCIUM  9.9  --    GFR: Estimated Creatinine Clearance: 25.8 mL/min (A) (by C-G formula based on SCr of 1.6 mg/dL (H)). Liver Function Tests: Recent Labs  Lab 07/24/24 1640  AST 35  ALT 75*  ALKPHOS 120  BILITOT 0.7  PROT 6.8  ALBUMIN 3.5   No results for input(s): LIPASE, AMYLASE in  the last 168 hours. No results for input(s): AMMONIA in the last 168 hours. Coagulation Profile: No results for input(s): INR, PROTIME in the last 168 hours. Cardiac Enzymes: No results for input(s): CKTOTAL, CKMB, CKMBINDEX, TROPONINI in the last 168 hours. BNP (last 3 results) No results for input(s): PROBNP in the last 8760 hours. HbA1C: No results for input(s): HGBA1C in the last 72 hours. CBG: No results for input(s): GLUCAP in the last 168 hours. Lipid Profile: No results for input(s): CHOL, HDL, LDLCALC, TRIG, CHOLHDL, LDLDIRECT in the last 72 hours. Thyroid  Function Tests: No results for input(s): TSH, T4TOTAL, FREET4, T3FREE, THYROIDAB in the last 72 hours. Anemia Panel: No results for input(s): VITAMINB12, FOLATE, FERRITIN, TIBC, IRON, RETICCTPCT in the last 72 hours. Urine analysis:    Component Value Date/Time   COLORURINE YELLOW 07/24/2024 1650   APPEARANCEUR CLEAR 07/24/2024 1650   LABSPEC 1.013 07/24/2024 1650   PHURINE 5.0 07/24/2024 1650   GLUCOSEU 50 (A) 07/24/2024 1650   HGBUR NEGATIVE 07/24/2024 1650   BILIRUBINUR NEGATIVE 07/24/2024 1650   KETONESUR NEGATIVE 07/24/2024 1650   PROTEINUR NEGATIVE 07/24/2024 1650   NITRITE NEGATIVE 07/24/2024 1650   LEUKOCYTESUR NEGATIVE 07/24/2024 1650   Sepsis Labs: @LABRCNTIP (procalcitonin:4,lacticidven:4) )No results found for this or any previous visit (from the past 240 hours).   Radiological Exams on Admission: CT L-SPINE NO CHARGE Result Date: 07/24/2024 EXAM: CT OF THE LUMBAR SPINE WITHOUT CONTRAST 07/24/2024 08:23:40 PM TECHNIQUE: CT of the lumbar spine was performed without the administration of intravenous contrast. Multiplanar reformatted images are provided for review. Automated exposure control, iterative reconstruction, and/or weight based adjustment of the mA/kV was utilized to reduce the radiation dose to as low as reasonably achievable. COMPARISON: None  available. CLINICAL HISTORY: Fall, Back pain FINDINGS: BONES AND ALIGNMENT: Normal vertebral body heights. No acute fracture or suspicious bone lesion. Grade 1 anterolisthesis of L5 on S1. DEGENERATIVE CHANGES: Moderate degenerative changes at L4-L5 and L5-S1. SOFT TISSUES: Evaluated on dedicated CT abdomen/pelvis. IMPRESSION: 1.  No acute traumatic injury. 2. Degenerative changes of the lower lumbar spine. Electronically signed by: Pinkie Pebbles MD 07/24/2024 08:31 PM EDT RP Workstation: [id number]   CT T-SPINE NO CHARGE Result Date: 07/24/2024 EXAM: CT THORACIC SPINE WITHOUT CONTRAST 07/24/2024 08:22:53 PM TECHNIQUE: CT of the thoracic spine was performed without the administration of intravenous contrast. Multiplanar reformatted images are provided for review. Automated exposure control, iterative reconstruction, and/or weight based adjustment of the mA/kV was utilized to reduce the radiation dose to as low as reasonably achievable. COMPARISON: None available. CLINICAL HISTORY: Fall, Back pain FINDINGS: BONES AND ALIGNMENT: Normal vertebral body heights. No acute fracture or suspicious bone lesion. Normal alignment. Mild superior endplate changes at T2 and T3, chronic. DEGENERATIVE CHANGES: Mild to moderate degenerative changes of the mid to lower thoracic spine. SOFT TISSUES: Evaluated on dedicated CT chest. IMPRESSION: 1. No acute abnormality of the thoracic spine. Electronically signed by: Pinkie Pebbles MD 07/24/2024 08:29 PM EDT RP Workstation: [id number]   CT Head Wo Contrast Result Date: 07/24/2024 EXAM: CT HEAD WITHOUT CONTRAST 07/24/2024 07:03:19 PM TECHNIQUE: CT of the head was performed without the administration of intravenous contrast. Automated exposure control, iterative reconstruction, and/or weight based adjustment of the mA/kV was utilized to reduce the radiation dose to as low as reasonably achievable. COMPARISON: 03/04/2022 CLINICAL HISTORY: Head trauma, intracranial arterial  injury suspected. Pt states she passed out last night and the night before. She hit her head night before and left flank last night. 10/10 pain in L. Flank. FINDINGS: BRAIN AND VENTRICLES: No acute hemorrhage. No evidence of acute infarct. No hydrocephalus. No extra-axial collection. No mass effect or midline shift. Mild intracranial atherosclerosis. Subcortical and periventricular small vessel ischemic changes. ORBITS: No acute abnormality. SINUSES: No acute abnormality. SOFT TISSUES AND SKULL: No acute soft tissue abnormality. No skull fracture. IMPRESSION: 1. No acute intracranial abnormality. Electronically signed by: Pinkie Pebbles MD 07/24/2024 07:19 PM EDT RP Workstation: [id number]   CT CHEST ABDOMEN PELVIS W CONTRAST Result Date: 07/24/2024 EXAM: CT CHEST, ABDOMEN AND PELVIS WITH CONTRAST 07/24/2024 07:03:19 PM TECHNIQUE: CT of the chest, abdomen and pelvis was performed with the administration of 75 mL of iohexol  (OMNIPAQUE ) 350 MG/ML injection. Multiplanar reformatted images are provided for review. Automated exposure control, iterative reconstruction, and/or weight based adjustment of the mA/kV was utilized to reduce the radiation dose to as low as reasonably achievable. COMPARISON: CT chest dated 10/29/2022. CLINICAL HISTORY: Polytrauma, blunt. Pt states she passed out last night and the night before. She hit her head night before and left flank last night. 10/10 pain in L. Flank. FINDINGS: CHEST: MEDIASTINUM AND LYMPH NODES: Heart and pericardium are unremarkable. The central airways are clear. No mediastinal, hilar or axillary lymphadenopathy. Thoracic aortic atherosclerosis. LUNGS AND PLEURA: No focal consolidation or pulmonary edema. No pleural effusion or pneumothorax. Mild subpleural scarring versus atelectasis in the lingula and bilateral lower lobes. ABDOMEN AND PELVIS: LIVER: The liver is unremarkable. GALLBLADDER AND BILE DUCTS: Gallbladder is unremarkable. No biliary ductal dilatation.  SPLEEN: No acute abnormality. PANCREAS: No acute abnormality. ADRENAL GLANDS: No acute abnormality. KIDNEYS, URETERS AND BLADDER: No stones in the kidneys or ureters. No hydronephrosis. No perinephric or periureteral stranding. Urinary bladder is unremarkable. GI AND BOWEL: Stomach demonstrates no acute abnormality. There is no bowel obstruction. Normal appendix (image 89). Mild sigmoid diverticulosis, without evidence of diverticulitis. REPRODUCTIVE ORGANS: Status post hysterectomy. PERITONEUM AND RETROPERITONEUM: No ascites. No free air. VASCULATURE: Aorta is normal in caliber. Atherosclerotic calcifications of the abdominal aorta and branch  vessels, although patent. ABDOMINAL AND PELVIS LYMPH NODES: No lymphadenopathy. BONES AND SOFT TISSUES: Nondisplaced left lateral 6th through 8th rib fractures. Right hip arthroplasty, without evidence of complication. Degenerative changes of the visualized thoracolumbar spine most prominent at L5-S1. No focal soft tissue abnormality. IMPRESSION: 1. Nondisplaced left lateral 6th8th rib fractures. No pneumothorax. 2. No traumatic injury to the abdomen/pelvis. 3. Additional ancillary findings as above. Electronically signed by: Pinkie Pebbles MD 07/24/2024 07:18 PM EDT RP Workstation: [id number]   CT Cervical Spine Wo Contrast Result Date: 07/24/2024 CLINICAL DATA:  Neck trauma.  Fall last night with head injury. EXAM: CT CERVICAL SPINE WITHOUT CONTRAST TECHNIQUE: Multidetector CT imaging of the cervical spine was performed without intravenous contrast. Multiplanar CT image reconstructions were also generated. RADIATION DOSE REDUCTION: This exam was performed according to the departmental dose-optimization program which includes automated exposure control, adjustment of the mA and/or kV according to patient size and/or use of iterative reconstruction technique. COMPARISON:  Cervical spine radiographs 02/10/2021. FINDINGS: Alignment: Straightening with mild degenerative  anterolisthesis at C5-6. Skull base and vertebrae: No evidence of acute cervical spine fracture or traumatic subluxation. Soft tissues and spinal canal: No prevertebral fluid or swelling. No visible canal hematoma. Disc levels: Multilevel spondylosis with disc space narrowing, uncinate spurring and facet hypertrophy most advanced at C4-5, C5-6 and C6-7. No large disc herniation or high-grade foraminal narrowing demonstrated. Up to moderate foraminal narrowing at C5-6 and C6-7. Upper chest: Clear lung apices. Aortic and great vessel atherosclerosis. Other: Bilateral TMJ degenerative changes. Asymmetric degenerative changes at the left sternoclavicular joint with associated left clavicular head sclerosis. IMPRESSION: 1. No evidence of acute cervical spine fracture, traumatic subluxation or static signs of instability. 2. Multilevel cervical spondylosis as described. 3.  Aortic Atherosclerosis (ICD10-I70.0). Electronically Signed   By: Elsie Perone M.D.   On: 07/24/2024 19:17   DG Chest 2 View Result Date: 07/24/2024 EXAM: 2 VIEW(S) XRAY OF THE CHEST 07/24/2024 06:15:00 PM COMPARISON: CT chest 04/06/2023. CT chest 10/29/2022. CLINICAL HISTORY: fall. L side chest pain, sob, fall FINDINGS: LUNGS AND PLEURA: No focal pulmonary opacity. No pulmonary edema. No pleural effusion. No pneumothorax. HEART AND MEDIASTINUM: No acute abnormality of the cardiac and mediastinal silhouettes. BONES AND SOFT TISSUES: Acute left sixth and seventh rib fractures. IMPRESSION: 1. Acute left sixth and seventh rib fractures Electronically signed by: Greig Pique MD 07/24/2024 06:35 PM EDT RP Workstation: [id number]    EKG: Independently reviewed.  Sinus bradycardia.  Assessment/Plan Principal Problem:   Syncope Active Problems:   Chronic HFrEF (heart failure with reduced ejection fraction) (HCC)   AKI (acute kidney injury)   Type 2 diabetes mellitus (HCC)    Syncope -   patient did not have any prodromal symptoms prior to  that 2 syncopal episode happened over the last 3 days.  Denies any chest pain shortness of breath or palpitations.  EKG does show some sinus bradycardia.  Will closely monitor on telemetry.  Check orthostatics in the morning.  Check digoxin  levels TSH.  I have also placed holding orders for digoxin  and metoprolol  and holding Aricept  for now given the bradycardia.  Given the chronic HFrEF with a syncopal episode will consult cardiology. Rib fracture left-sided 6th through 8th ribs.  Incentive spirometry.  May discuss with trauma team in the morning. Acute on chronic kidney disease stage III creatinine mildly increased from baseline.  Patient did receive 250 cc bolus in the ER.  Will hold patient's diuretics for now.  May need to hold Entresto  and  spironolactone  in the morning if creatinine worsens. Chronic HFrEF last EF measured was in March 2025 when EF was 40 to 45%.  Holding Lasix  for now and also patient received fluid 250 cc in the ER.  Check orthostatics.  May have to hold Entresto  and spironolactone  if creatinine worsens. Diabetes mellitus type 2 with hyperglycemia -   patient states her blood glucose has been poorly controlled and recently was restarted on metformin.  Patient was switched from glipizide due to hypoglycemic episodes.  Check hemoglobin A1c presently on sliding scale coverage.  May need long-acting insulin . Hyperlipidemia on statins. GERD on PPI. History of fibromyalgia and chronic pain has stopped taking gabapentin  last 1 week due to nightmares. History of cognitive impairment takes Aricept  which is on hold due to bradycardia. History of ADHD on Adderall. Multiple skin lesions from picking.  Does not look infected.  Since patient has syncopal episode with no prodromal symptoms will need close monitoring further workup and more than 2 midnight stay.   DVT prophylaxis: Lovenox . Code Status: DNR. Family Communication: Discussed with patient. Disposition Plan: Monitored  bed. Consults called: Cardiology. Admission status: The patient.

## 2024-07-25 ENCOUNTER — Ambulatory Visit: Admitting: "Endocrinology

## 2024-07-25 DIAGNOSIS — I509 Heart failure, unspecified: Secondary | ICD-10-CM | POA: Diagnosis not present

## 2024-07-25 DIAGNOSIS — R55 Syncope and collapse: Secondary | ICD-10-CM | POA: Diagnosis not present

## 2024-07-25 DIAGNOSIS — E785 Hyperlipidemia, unspecified: Secondary | ICD-10-CM | POA: Diagnosis not present

## 2024-07-25 DIAGNOSIS — I2510 Atherosclerotic heart disease of native coronary artery without angina pectoris: Secondary | ICD-10-CM

## 2024-07-25 LAB — DIGOXIN LEVEL: Digoxin Level: <0.6 ng/mL — ABNORMAL LOW (ref 0.8–2.0)

## 2024-07-25 LAB — CBC
HCT: 38.6 % (ref 36.0–46.0)
Hemoglobin: 12.8 g/dL (ref 12.0–15.0)
MCH: 30.8 pg (ref 26.0–34.0)
MCHC: 33.2 g/dL (ref 30.0–36.0)
MCV: 93 fL (ref 80.0–100.0)
Platelets: 268 K/uL (ref 150–400)
RBC: 4.15 MIL/uL (ref 3.87–5.11)
RDW: 13.4 % (ref 11.5–15.5)
WBC: 9.1 K/uL (ref 4.0–10.5)
nRBC: 0 % (ref 0.0–0.2)

## 2024-07-25 LAB — BASIC METABOLIC PANEL WITH GFR
Anion gap: 12 (ref 5–15)
BUN: 49 mg/dL — ABNORMAL HIGH (ref 8–23)
CO2: 22 mmol/L (ref 22–32)
Calcium: 9.4 mg/dL (ref 8.9–10.3)
Chloride: 100 mmol/L (ref 98–111)
Creatinine, Ser: 1.26 mg/dL — ABNORMAL HIGH (ref 0.44–1.00)
GFR, Estimated: 43 mL/min — ABNORMAL LOW (ref >60)
Glucose, Bld: 310 mg/dL — ABNORMAL HIGH (ref 70–99)
Potassium: 3.7 mmol/L (ref 3.5–5.1)
Sodium: 134 mmol/L — ABNORMAL LOW (ref 135–145)

## 2024-07-25 LAB — HEMOGLOBIN A1C
Hgb A1c MFr Bld: 7.4 % — ABNORMAL HIGH (ref 4.8–5.6)
Mean Plasma Glucose: 165.68 mg/dL

## 2024-07-25 LAB — CREATININE, SERUM
Creatinine, Ser: 1.36 mg/dL — ABNORMAL HIGH (ref 0.44–1.00)
GFR, Estimated: 39 mL/min — ABNORMAL LOW (ref >60)

## 2024-07-25 LAB — GLUCOSE, CAPILLARY
Glucose-Capillary: 148 mg/dL — ABNORMAL HIGH (ref 70–99)
Glucose-Capillary: 236 mg/dL — ABNORMAL HIGH (ref 70–99)
Glucose-Capillary: 182 mg/dL — ABNORMAL HIGH (ref 70–99)

## 2024-07-25 LAB — MAGNESIUM: Magnesium: 1.8 mg/dL (ref 1.7–2.4)

## 2024-07-25 LAB — TSH: TSH: 1.675 u[IU]/mL (ref 0.350–4.500)

## 2024-07-25 MED ORDER — LIDOCAINE 5 % EX PTCH
1.0000 | MEDICATED_PATCH | CUTANEOUS | Status: DC
  Administered 2024-07-25: 1 via TRANSDERMAL
  Filled 2024-07-25: qty 1

## 2024-07-25 MED ORDER — SACUBITRIL-VALSARTAN 49-51 MG PO TABS
1.0000 | ORAL_TABLET | Freq: Two times a day (BID) | ORAL | Status: DC
  Administered 2024-07-25 – 2024-07-26 (×2): 1 via ORAL
  Filled 2024-07-25 (×3): qty 1

## 2024-07-25 MED ORDER — POLYETHYLENE GLYCOL 3350 17 G PO PACK
17.0000 g | PACK | Freq: Every day | ORAL | Status: DC | PRN
  Administered 2024-07-25: 17 g via ORAL
  Filled 2024-07-25: qty 1

## 2024-07-25 MED ORDER — METHOCARBAMOL 500 MG PO TABS
500.0000 mg | ORAL_TABLET | Freq: Three times a day (TID) | ORAL | Status: DC | PRN
Start: 2024-07-25 — End: 2024-07-26
  Administered 2024-07-25: 500 mg via ORAL
  Filled 2024-07-25: qty 1

## 2024-07-25 NOTE — Progress Notes (Signed)
 PROGRESS NOTE Erica Cervantes  FMW:[id number] DOB: 04-Jun-1946 DOA: 07/24/2024 PCP: Toribio Jerel [name], MD  Brief Narrative/Hospital Course: Erica Cervantes is a 78 y.o. female with PMH of chronic HFrEF, chronic kidney disease stage III, diabetes mellitus type 2, GERD, chronic pain/fibromyalgia, hypertension was brought to the ER after patient had syncopal episodes- for last 3 days patient had 2 syncopal episodes.First time was on Saturday, 11 October when was walking to open the door when she suddenly lost consciousness.  There was no prodrome and she fell onto the floor briefly and regained consciousness soon.  The same thing happened the following day again with a similar situation.  But the second time she did hit her head also her chest onto the vacuum cleaner.  Her chest has been hurting since and she presents to the ER.  Patient states over the last 1 week she stopped taking gabapentin  since patient was having nightmares.  Patient also was switched to metformin last 1 week has had some diarrhea from it.  Before that patient used to be on glipizide and Jardiance which was stopped.  Patient states her blood glucose has been poorly controlled.   ED Course: In the ER patient CT head chest abdomen pelvis and C-spine T-spine and L-spine shows fractures involving the left 6th through 8th rib.  EKG shows sinus bradycardia heart rate is around 59 bpm.  Labs show sodium 131 creatinine 1.4 glucose 244 WBC 13.2 hemoglobin 13.6 ALT 75.  Patient was given to 50 cc normal saline bolus with concern for possible dehydration admitted for syncope with no prodrome.   Subjective: Seen and examined today Overnight heart rate in 50s-60s, BP stable on room air Labs reviewed creatinine slightly better 1.2 blood sugar 310 A1c 7.4 Resting comfortably tolerating p.o. while awaiting to speak with cardiology  Assessment and plan:  Syncope: Without any prodrome prior to that 2 syncopal episode happened over the last 3 days.No  chest pain shortness of breath.  EKG sinus bradycardia, orthostatic vitals negative this morning.  Cardiology has been consulted holding beta-blocker digoxin  for now.  Dig level less than 0.6 TSH normal. Ptot eval.  Rib fracture left-sided 6th through 8th ribs: Continue incentive spirometry. Spoke w/ trauma team-Kaylea, advised multimodal pain management with scheduled Tylenol , lidocaine  patch and Robaxin  and as needed opiates  IS  Acute on chronic kidney disease stage III: creatinine mildly increased from baseline.  S/p 250 cc bolus in the ER. Will hold patient's diuretics for now.   Creatinine slightly improving, monitor closely while on Entresto  and Aldactone .   Recent Labs    12/13/23 1220 04/06/24 1414 07/24/24 1640 07/24/24 1641 07/24/24 2337 07/25/24 0319  BUN 16 23 56* 56*  --  49*  CREATININE 1.28* 1.17* 1.44* 1.60* 1.36* 1.26*  CO2 25 24 22   --   --  22  K 4.3 4.8 3.7 3.8  --  3.7    Chronic HFrEF: last EF measured was in March 2025 when EF was 40 to 45%.  Holding Lasix  for now Cardio consulted continue home Entresto  Aldactone   Diabetes mellitus type 2 with hyperglycemia: Poorly controlled recently was placed on metformin.  Switched from glipizide due to hypoglycemia episodes.  A1c 9.1 blood sugar stable continue SSI Recent Labs  Lab 07/24/24 2337 07/25/24 0817 07/25/24 1224  GLUCAP  --  148* 236*  HGBA1C 7.4*  --   --     Hyperlipidemia: Continue statins.  GERD: cont PPI.  History of fibromyalgia and chronic pain: has  stopped taking gabapentin  last 1 week due to nightmares.  History of cognitive impairment: cont Aricept  which is on hold due to bradycardia.  History of ADHD:  Cont Adderall.  Multiple skin lesions : from picking.  Does not look infected.  Mobility: PT Orders: Active  PT Follow up Rec:     DVT prophylaxis: enoxaparin  (LOVENOX ) injection 40 mg Start: 07/25/24 1000 Code Status:   Code Status: Limited: Do not attempt resuscitation (DNR)  -DNR-LIMITED -Do Not Intubate/DNI  Family Communication: plan of care discussed with patient/daughter at bedside. Patient status is: Remains hospitalized because of severity of illness Level of care: Telemetry Cardiac   Dispo: The patient is from: home'            Anticipated disposition: TBD Objective: Vitals last 24 hrs: Vitals:   07/25/24 0016 07/25/24 0420 07/25/24 0826 07/25/24 1224  BP: 131/68 128/61 120/69 (!) 101/55  Pulse: 62 (!) 57 (!) 59 88  Resp: 15     Temp: (!) 97.5 F (36.4 C) 98.5 F (36.9 C) (!) 97.5 F (36.4 C) 97.9 F (36.6 C)  TempSrc: Oral Oral    SpO2: 100% 100% 100% 97%  Weight:      Height:        Physical Examination: General exam: alert awake, oriented, older than stated age HEENT:Oral mucosa moist, Ear/Nose WNL grossly Respiratory system: Bilaterally clear BS,no use of accessory muscle Cardiovascular system: S1 & S2 +, No JVD. Gastrointestinal system: Abdomen soft,NT,ND, BS+ Nervous System: Alert, awake, moving all extremities,and following commands. Extremities: extremities warm, leg edema neg Skin: No rashes,no icterus. MSK: Normal muscle bulk,tone, power   Medications reviewed:  Scheduled Meds:  amphetamine-dextroamphetamine  40 mg Oral q morning   And   amphetamine-dextroamphetamine  20 mg Oral Q1400   aspirin  EC  81 mg Oral Daily   [START ON 07/26/2024] atorvastatin   10 mg Oral Q Wed   busPIRone   30 mg Oral TID   digoxin   0.0625 mg Oral Daily   DULoxetine   120 mg Oral Q1200   enoxaparin  (LOVENOX ) injection  40 mg Subcutaneous Q24H   Influenza vac split trivalent PF  0.5 mL Intramuscular Tomorrow-1000   insulin  aspart  0-9 Units Subcutaneous TID WC   lidocaine   1 patch Transdermal Q24H   metoprolol  succinate  25 mg Oral Daily   pantoprazole  40 mg Oral Daily   sacubitril -valsartan   1 tablet Oral BID   spironolactone   25 mg Oral Daily   Continuous Infusions: Diet: Diet Order             Diet heart healthy/carb modified Room  service appropriate? Yes; Fluid consistency: Thin  Diet effective now                    Data Reviewed: I have personally reviewed following labs and imaging studies ( see epic result tab) CBC: Recent Labs  Lab 07/24/24 1640 07/24/24 1641 07/24/24 2337 07/25/24 0319  WBC 13.2*  --  10.0 9.1  NEUTROABS 9.5*  --   --   --   HGB 13.6 14.6 12.6 12.8  HCT 42.5 43.0 38.8 38.6  MCV 97.0  --  93.3 93.0  PLT 348  --  296 268   CMP: Recent Labs  Lab 07/24/24 1640 07/24/24 1641 07/24/24 2337 07/25/24 0319  NA 131* 135  --  134*  K 3.7 3.8  --  3.7  CL 97* 101  --  100  CO2 22  --   --  22  GLUCOSE 244* 234*  --  310*  BUN 56* 56*  --  49*  CREATININE 1.44* 1.60* 1.36* 1.26*  CALCIUM  9.9  --   --  9.4  MG  --   --  1.8  --    GFR: Estimated Creatinine Clearance: 32.7 mL/min (A) (by C-G formula based on SCr of 1.26 mg/dL (H)). Recent Labs  Lab 07/24/24 1640  AST 35  ALT 75*  ALKPHOS 120  BILITOT 0.7  PROT 6.8  ALBUMIN 3.5   No results for input(s): LIPASE, AMYLASE in the last 168 hours. No results for input(s): AMMONIA in the last 168 hours. Coagulation Profile: No results for input(s): INR, PROTIME in the last 168 hours. Unresulted Labs (From admission, onward)     Start     Ordered   07/31/24 0500  Creatinine, serum  (enoxaparin  (LOVENOX )    CrCl >/= 30 ml/min)  Weekly,   R     Comments: while on enoxaparin  therapy    07/24/24 2120           Antimicrobials/Microbiology: Anti-infectives (From admission, onward)    None         Component Value Date/Time   SDES BLOOD LEFT ANTECUBITAL 10/29/2022 2145   SDES BLOOD LEFT ANTECUBITAL 10/29/2022 2145   SPECREQUEST  10/29/2022 2145    BOTTLES DRAWN AEROBIC AND ANAEROBIC Blood Culture results may not be optimal due to an excessive volume of blood received in culture bottles   SPECREQUEST  10/29/2022 2145    BOTTLES DRAWN AEROBIC AND ANAEROBIC Blood Culture adequate volume   CULT  10/29/2022 2145     NO GROWTH 5 DAYS Performed at [facility], [address], [place], [place] [address]    CULT  10/29/2022 2145    NO GROWTH 5 DAYS Performed at [facility], [address], [place], [place] [address]    REPTSTATUS 11/03/2022 FINAL 10/29/2022 2145   REPTSTATUS 11/03/2022 FINAL 10/29/2022 2145    Procedures:    Mennie LAMY, MD [facility] 07/25/2024, 1:51 PM

## 2024-07-25 NOTE — Hospital Course (Addendum)
 Erica Cervantes is a 78 y.o. female with PMH of chronic HFrEF, chronic kidney disease stage III, diabetes mellitus type 2, GERD, chronic pain/fibromyalgia, hypertension was brought to the ER after patient had syncopal episodes- for last 3 days patient had 2 syncopal episodes.First time was on Saturday, 11 October when was walking to open the door when she suddenly lost consciousness.  There was no prodrome and she fell onto the floor briefly and regained consciousness soon.  The same thing happened the following day again with a similar situation.  But the second time she did hit her head also her chest onto the vacuum cleaner.  Her chest has been hurting since and she presents to the ER.  Patient states over the last 1 week she stopped taking gabapentin  since patient was having nightmares.  Patient also was switched to metformin last 1 week has had some diarrhea from it.  Before that patient used to be on glipizide and Jardiance which was stopped.  Patient states her blood glucose has been poorly controlled.   ED Course: In the ER patient CT head chest abdomen pelvis and C-spine T-spine and L-spine shows fractures involving the left 6th through 8th rib.  EKG shows sinus bradycardia heart rate is around 59 bpm.  Labs show sodium 131 creatinine 1.4 glucose 244 WBC 13.2 hemoglobin 13.6 ALT 75.  Patient was given to 50 cc normal saline bolus with concern for possible dehydration admitted for syncope with no prodrome.   Subjective: Seen and examined today Overnight heart rate in 50s-60s, BP stable on room air Labs reviewed creatinine slightly better 1.2 blood sugar 310 A1c 7.4 Resting comfortably tolerating p.o. while awaiting to speak with cardiology  Assessment and plan:  Syncope: Without any prodrome prior to that 2 syncopal episode happened over the last 3 days.No chest pain shortness of breath.  EKG sinus bradycardia, orthostatic vitals negative this morning.  Cardiology has been consulted holding  beta-blocker digoxin  for now.  Dig level less than 0.6 TSH normal. Ptot eval.  Rib fracture left-sided 6th through 8th ribs: Continue incentive spirometry. Spoke w/ trauma team-Kaylea, advised multimodal pain management with scheduled Tylenol , lidocaine  patch and Robaxin  and as needed opiates  IS  Acute on chronic kidney disease stage III: creatinine mildly increased from baseline.  S/p 250 cc bolus in the ER. Will hold patient's diuretics for now.   Creatinine slightly improving, monitor closely while on Entresto  and Aldactone .   Recent Labs    12/13/23 1220 04/06/24 1414 07/24/24 1640 07/24/24 1641 07/24/24 2337 07/25/24 0319  BUN 16 23 56* 56*  --  49*  CREATININE 1.28* 1.17* 1.44* 1.60* 1.36* 1.26*  CO2 25 24 22   --   --  22  K 4.3 4.8 3.7 3.8  --  3.7    Chronic HFrEF: last EF measured was in March 2025 when EF was 40 to 45%.  Holding Lasix  for now Cardio consulted continue home Entresto  Aldactone   Diabetes mellitus type 2 with hyperglycemia: Poorly controlled recently was placed on metformin.  Switched from glipizide due to hypoglycemia episodes.  A1c 9.1 blood sugar stable continue SSI Recent Labs  Lab 07/24/24 2337 07/25/24 0817 07/25/24 1224  GLUCAP  --  148* 236*  HGBA1C 7.4*  --   --     Hyperlipidemia: Continue statins.  GERD: cont PPI.  History of fibromyalgia and chronic pain: has stopped taking gabapentin  last 1 week due to nightmares.  History of cognitive impairment: cont Aricept  which is on hold  due to bradycardia.  History of ADHD:  Cont Adderall.  Multiple skin lesions : from picking.  Does not look infected.  Mobility: PT Orders: Active  PT Follow up Rec:     DVT prophylaxis: enoxaparin  (LOVENOX ) injection 40 mg Start: 07/25/24 1000 Code Status:   Code Status: Limited: Do not attempt resuscitation (DNR) -DNR-LIMITED -Do Not Intubate/DNI  Family Communication: plan of care discussed with patient/daughter at bedside. Patient status is:  Remains hospitalized because of severity of illness Level of care: Telemetry Cardiac   Dispo: The patient is from: home'            Anticipated disposition: TBD Objective: Vitals last 24 hrs: Vitals:   07/25/24 0016 07/25/24 0420 07/25/24 0826 07/25/24 1224  BP: 131/68 128/61 120/69 (!) 101/55  Pulse: 62 (!) 57 (!) 59 88  Resp: 15     Temp: (!) 97.5 F (36.4 C) 98.5 F (36.9 C) (!) 97.5 F (36.4 C) 97.9 F (36.6 C)  TempSrc: Oral Oral    SpO2: 100% 100% 100% 97%  Weight:      Height:        Physical Examination: General exam: alert awake, oriented, older than stated age HEENT:Oral mucosa moist, Ear/Nose WNL grossly Respiratory system: Bilaterally clear BS,no use of accessory muscle Cardiovascular system: S1 & S2 +, No JVD. Gastrointestinal system: Abdomen soft,NT,ND, BS+ Nervous System: Alert, awake, moving all extremities,and following commands. Extremities: extremities warm, leg edema neg Skin: No rashes,no icterus. MSK: Normal muscle bulk,tone, power   Medications reviewed:  Scheduled Meds:  amphetamine-dextroamphetamine  40 mg Oral q morning   And   amphetamine-dextroamphetamine  20 mg Oral Q1400   aspirin  EC  81 mg Oral Daily   [START ON 07/26/2024] atorvastatin   10 mg Oral Q Wed   busPIRone   30 mg Oral TID   digoxin   0.0625 mg Oral Daily   DULoxetine   120 mg Oral Q1200   enoxaparin  (LOVENOX ) injection  40 mg Subcutaneous Q24H   Influenza vac split trivalent PF  0.5 mL Intramuscular Tomorrow-1000   insulin  aspart  0-9 Units Subcutaneous TID WC   lidocaine   1 patch Transdermal Q24H   metoprolol  succinate  25 mg Oral Daily   pantoprazole  40 mg Oral Daily   sacubitril -valsartan   1 tablet Oral BID   spironolactone   25 mg Oral Daily   Continuous Infusions: Diet: Diet Order             Diet heart healthy/carb modified Room service appropriate? Yes; Fluid consistency: Thin  Diet effective now

## 2024-07-25 NOTE — Evaluation (Signed)
 Physical Therapy Evaluation Patient Details Name: KIMILA PAPALEO MRN: [id number] DOB: 02-Nov-1945 Today's Date: 07/25/2024  History of Present Illness  Pt is a 78 y.o. F who presents 07/24/2024 with syncopal episodes. Significant PMH: DM2, HTN, CKD3a, cognitive deficits, chronic pain, fibromyalgia, R THA, chronic HFrEF.  Clinical Impression  Pt pleasant and agreeable to PT evaluation. PTA, pt lives alone, uses a cane intermittently and has family assist occasionally for bringing groceries and meals. Pt with goal of going to grandson's wedding on Thursday. Pt presents with expected left flank pain with mobility, but overall is moving well. Pt exiting right side of bed without physical assist. Performs transfers and ambulates household distances with a RW and supervision. Orthostatics negative (see vitals flowsheet), SpO2 96% on RA, HR 73-94 bpm. Pt educated on IS use. Recommend HHPT at d/c in setting of falls and for home safety evaluation.      If plan is discharge home, recommend the following: Assistance with cooking/housework;Help with stairs or ramp for entrance;Assist for transportation   Can travel by private vehicle        Equipment Recommendations None recommended by PT  Recommendations for Other Services       Functional Status Assessment Patient has had a recent decline in their functional status and demonstrates the ability to make significant improvements in function in a reasonable and predictable amount of time.     Precautions / Restrictions Precautions Precautions: Fall Restrictions Weight Bearing Restrictions Per Provider Order: No      Mobility  Bed Mobility Overal bed mobility: Needs Assistance Bed Mobility: Supine to Sit     Supine to sit: Supervision     General bed mobility comments: Exiting R side of bed, no physical assist required, increased time    Transfers Overall transfer level: Needs assistance Equipment used: Rolling walker (2  wheels) Transfers: Sit to/from Stand Sit to Stand: Supervision                Ambulation/Gait Ambulation/Gait assistance: Supervision Gait Distance (Feet): 250 Feet Assistive device: Rolling walker (2 wheels) Gait Pattern/deviations: Step-through pattern, Decreased stride length          Stairs            Wheelchair Mobility     Tilt Bed    Modified Rankin (Stroke Patients Only)       Balance Overall balance assessment: Mild deficits observed, not formally tested                                           Pertinent Vitals/Pain Pain Assessment Pain Assessment: Faces Faces Pain Scale: Hurts little more Pain Location: L flank with movement Pain Descriptors / Indicators: Discomfort, Grimacing, Guarding Pain Intervention(s): Limited activity within patient's tolerance, Monitored during session, Premedicated before session, Repositioned    Home Living Family/patient expects to be discharged to:: Private residence Living Arrangements: Alone Available Help at Discharge: Family;Available PRN/intermittently (Son intermittently available.) Type of Home: House Home Access: Stairs to enter Entrance Stairs-Rails: Doctor, general practice of Steps: 1   Home Layout: One level Home Equipment: Agricultural consultant (2 wheels);Rollator (4 wheels);Shower seat;Cane - single point;BSC/3in1;Wheelchair - manual;Other (comment) (adjustable bed with no rails)      Prior Function Prior Level of Function : Needs assist             Mobility Comments: uses cane prn ADLs Comments: Some assist  for IADLs and getting grocieries.     Extremity/Trunk Assessment   Upper Extremity Assessment Upper Extremity Assessment: Defer to OT evaluation    Lower Extremity Assessment Lower Extremity Assessment: Overall WFL for tasks assessed    Cervical / Trunk Assessment Cervical / Trunk Assessment: Normal  Communication   Communication Communication: No  apparent difficulties    Cognition Arousal: Alert Behavior During Therapy: WFL for tasks assessed/performed   PT - Cognitive impairments: No apparent impairments                         Following commands: Intact       Cueing Cueing Techniques: Verbal cues     General Comments      Exercises     Assessment/Plan    PT Assessment Patient needs continued PT services  PT Problem List Decreased strength;Decreased activity tolerance;Decreased balance;Decreased mobility;Pain       PT Treatment Interventions DME instruction;Gait training;Stair training;Therapeutic exercise;Therapeutic activities;Functional mobility training;Balance training;Patient/family education    PT Goals (Current goals can be found in the Care Plan section)  Acute Rehab PT Goals Patient Stated Goal: go to grandson's wedding on Thursday PT Goal Formulation: With patient Time For Goal Achievement: 08/08/24 Potential to Achieve Goals: Good    Frequency Min 2X/week     Co-evaluation               AM-PAC PT 6 Clicks Mobility  Outcome Measure Help needed turning from your back to your side while in a flat bed without using bedrails?: None Help needed moving from lying on your back to sitting on the side of a flat bed without using bedrails?: A Little Help needed moving to and from a bed to a chair (including a wheelchair)?: A Little Help needed standing up from a chair using your arms (e.g., wheelchair or bedside chair)?: A Little Help needed to walk in hospital room?: A Little Help needed climbing 3-5 steps with a railing? : A Little 6 Click Score: 19    End of Session Equipment Utilized During Treatment: Gait belt Activity Tolerance: Patient tolerated treatment well Patient left: in chair;with call bell/phone within reach;with chair alarm set;with nursing/sitter in room Nurse Communication: Mobility status PT Visit Diagnosis: History of falling (Z91.81)    Time: 8592-8540 PT  Time Calculation (min) (ACUTE ONLY): 52 min   Charges:   PT Evaluation $PT Eval Low Complexity: 1 Low PT Treatments $Therapeutic Activity: 23-37 mins PT General Charges $$ ACUTE PT VISIT: 1 Visit         Aleck Daring, PT, DPT Acute Rehabilitation Services Office [phone]   Aleck ONEIDA Daring 07/25/2024, 3:12 PM

## 2024-07-25 NOTE — TOC CAGE-AID Note (Signed)
 Transition of Care [facility]) - CAGE-AID Screening   Patient Details  Name: Erica Cervantes MRN: [id number] Date of Birth: [birth year]  Transition of Care [facility]) CM/SW Contact:    Hayslee Casebolt E Hildegarde Dunaway, LCSW Phone Number: 07/25/2024, 10:38 AM   Clinical Narrative: No SA noted.   CAGE-AID Screening:    Have You Ever Felt You Ought to Cut Down on Your Drinking or Drug Use?: No Have People Annoyed You By Critizing Your Drinking Or Drug Use?: No Have You Felt Bad Or Guilty About Your Drinking Or Drug Use?: No Have You Ever Had a Drink or Used Drugs First Thing In The Morning to Steady Your Nerves or to Get Rid of a Hangover?: No CAGE-AID Score: 0  Substance Abuse Education Offered: No

## 2024-07-25 NOTE — Care Management Obs Status (Signed)
 MEDICARE OBSERVATION STATUS NOTIFICATION   Patient Details  Name: Erica Cervantes MRN: [id number] Date of Birth: 1945-11-15   Medicare Observation Status Notification Given:  Yes Verbally reviewed observation notice with [name] telephonically at [phone].  Copy will be delivered to the patients room.    [name] 07/25/2024, 11:38 AM

## 2024-07-25 NOTE — Consult Note (Signed)
 Cardiology Consultation   Patient ID: Erica Cervantes MRN: [id number]; DOB: 06-Feb-1946  Admit date: 07/24/2024 Date of Consult: 07/25/2024  PCP:  Erica Jerel [name], MD   [facility] C[facility] Providers Cardiologist:  Erica Parchment, MD        Patient Profile: Erica Cervantes is a 78 y.o. female with a hx of chronic anemia, type 2 diabetes, hypertension, chronic pain, cognitive deficits, GERD, nonischemic cardiomyopathy, and chronic systolic heart failure who is being seen 07/25/2024 for the evaluation of syncope at the request of Erica LAMY MD.  History of Present Illness: Erica Cervantes is an 78 year old female with prior cardiac history listed below.  On 10/2022 the patient was hospitalized at [facility] for shortness of breath.  Echocardiogram was done and showed a severely reduced LVEF of 15%, moderate pulmonary hypertension, and moderate MR.  Later on 1/24 the patient had a cardiac catheterization that showed mild nonobstructive CAD (30% in the mid to proximal LCx, 50% in the ramus).  A cardiac MRI was done and showed myocardial fibrosis but no other findings of infiltrative disease.  Echo on 02/2023 was reduced at 20 to 25%.  On 12/2023 the patient's LVEF improved to 40 to 45%.  Was last seen at the heart failure clinic on 03/2024 at that time was on spironolactone , metoprolol , digoxin , Entresto , and Lasix .  Patient was off Farxiga  due to history of yeast infections.  At that time there were concerns about the patient's cognitive impairment and the patient's medication adherence.  The patient presented to the emergency department after falling and hitting her head and left flank.   On interview the patient reported that she had 2 episodes of syncope.  Stated that she was going to the front door both times that the syncopal episode happened.  Denies any dizziness or lightheadedness prior to the fall.  Both of these episodes happen during the evening.  Has reported some poor oral intake.  Stated that she  has had significant weakness multiple episodes of hypoglycemia while trying Mounjaro.  Because of this the Mounjaro was discontinued on 07/05/2024.  Stated that she has been able to be more active for the 2 weeks since Mounjaro was stopped.  Labs here did show the patient had an AKI with a creatinine of 1.6 on admission.  This supports patient being dehydrated.  Denies nicotine use, alcohol  use, illicit substance use.  Stated that she does spend some time outside in the yard raking but was unable to tell me when was the last time she was able to do this.  Denies chest pain, shortness of breath, orthopnea, lower extremity edema, fever, diaphoresis, nausea, vomiting.  Labs showed hemoglobin A1c of 7.4, normal TSH of 1.67, sodium of 134, potassium of 3.7, hyperglycemia of 310, creatinine of 1.26 this appears to be about patient's baseline, BUN of 49, normal hemoglobin of 12.8, and normal platelets of 268.  CT head, chest, abdomen, pelvis, and C-spine showed: Nondisplaced fractures of the left 6th and 8th ribs, no acute intracranial abnormality, and no acute cervical spine abnormality.  EKG showed sinus bradycardia with a rate of 59.   Past Medical History:  Diagnosis Date   Chronic kidney disease    stage 4   Depression    Diabetes mellitus without complication (HCC)    GERD (gastroesophageal reflux disease)    Hypertension    Neuromuscular disorder (HCC)    peripheral neuropathy    Past Surgical History:  Procedure Laterality Date   ABDOMINAL HYSTERECTOMY  52   CHOLECYSTECTOMY     age 78   DILATION AND CURETTAGE OF UTERUS     x 4  1980   RIGHT/LEFT HEART CATH AND CORONARY ANGIOGRAPHY N/A 10/30/2022   Procedure: RIGHT/LEFT HEART CATH AND CORONARY ANGIOGRAPHY;  Surgeon: Erica Erica SAUNDERS, MD;  Location: [facility];  Service: Cardiovascular;  Laterality: N/A;   TONSILLECTOMY     age 65   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   Procedure: TOTAL HIP ARTHROPLASTY ANTERIOR APPROACH;   Surgeon: Yvone Rush, MD;  Location: [facility];  Service: Orthopedics;  Laterality: Right;     Home Medications:  Prior to Admission medications   Medication Sig Start Date End Date Taking? Authorizing Provider  amphetamine-dextroamphetamine (ADDERALL) 20 MG tablet Take 20-40 mg by mouth See admin instructions. Take 40 mg (2 tablets) by mouth in the morning and 20 mg (1 tablet) at 2 pm 10/26/17  Yes [provider]  aspirin  EC 81 MG tablet Take 1 tablet (81 mg total) by mouth daily. Swallow whole. 11/07/22  Yes Cervantes, Erica Toribio, MD  atorvastatin  (LIPITOR) 10 MG tablet Take 10 mg by mouth See admin instructions. Take 1 tablet by mouth every Sunday and Wednesday   Yes [provider]  busPIRone  (BUSPAR ) 30 MG tablet Take 30 mg by mouth 3 (three) times daily. 08/23/17  Yes [provider]  cholecalciferol (VITAMIN D3) 25 MCG (1000 UNIT) tablet Take 2,000 Units by mouth daily.   Yes [provider]  cycloSPORINE (RESTASIS) 0.05 % ophthalmic emulsion Place 2 drops into both eyes 2 (two) times daily.   Yes [provider]  digoxin  (LANOXIN ) 0.125 MG tablet Take 0.5 tablets (0.0625 mg total) by mouth daily. 11/07/22 07/24/24 Yes Cervantes, Erica Toribio, MD  donepezil  (ARICEPT ) 5 MG tablet Take 5 mg by mouth at bedtime.   Yes [provider]  doxycycline  (VIBRA -TABS) 100 MG tablet Take 100 mg by mouth 2 (two) times daily. 06/19/24  Yes [provider]  DULoxetine  (CYMBALTA ) 60 MG capsule Take 120 mg by mouth daily at 12 noon. 09/25/17  Yes [provider]  ENTRESTO  97-103 MG take 1 tablet by mouth 2 (two) times daily. 07/06/24  Yes Erica Manuelita Garre, PA-C  folic acid  (FOLVITE ) 800 MCG tablet Take 800-2,400 mcg by mouth daily.   Yes [provider]  furosemide  (LASIX ) 40 MG tablet Take 40 mg by mouth daily. 05/04/24  Yes [provider]  Guaifenesin (MUCINEX MAXIMUM STRENGTH) 1200 MG TB12 Take 1,200 mg by mouth  daily as needed (severe congestion).   Yes [provider]  metformin (FORTAMET) 500 MG (OSM) 24 hr tablet Take 500 mg by mouth daily with breakfast.   Yes [provider]  metoprolol  succinate (TOPROL  XL) 25 MG 24 hr tablet Take 1 tablet (25 mg total) by mouth daily. 12/04/22  Yes [place], Harlene HERO, FNP  omeprazole (PRILOSEC) 20 MG capsule Take 20 mg by mouth daily.   Yes [provider]  spironolactone  (ALDACTONE ) 25 MG tablet Take 1 tablet (25 mg total) by mouth daily. 11/07/22 07/24/25 Yes Cervantes, Erica Toribio, MD  triamcinolone  cream (KENALOG ) 0.1 % Apply 1 application. topically 3 (three) times daily as needed for itching or rash. 12/29/21  Yes [provider]  gabapentin  (NEURONTIN ) 100 MG capsule Take 1 capsule qhs x 1 week then increase to 2 capsules qhs. Patient not taking: Reported on 07/24/2024    [provider]  Lancets [facility] DELICA PLUS LANCET30G) MISC  12/02/23  [provider]  MOUNJARO 2.5 MG/0.5ML Pen  05/04/24   [provider]  [facility] VERIO test strip 1 each daily. 12/02/23   [provider]    Scheduled Meds:  amphetamine-dextroamphetamine  40 mg Oral q morning   And   amphetamine-dextroamphetamine  20 mg Oral Q1400   aspirin  EC  81 mg Oral Daily   [START ON 07/26/2024] atorvastatin   10 mg Oral Q Wed   busPIRone   30 mg Oral TID   DULoxetine   120 mg Oral Q1200   enoxaparin  (LOVENOX ) injection  40 mg Subcutaneous Q24H   Influenza vac split trivalent PF  0.5 mL Intramuscular Tomorrow-1000   insulin  aspart  0-9 Units Subcutaneous TID WC   lidocaine   1 patch Transdermal Q24H   metoprolol  succinate  25 mg Oral Daily   pantoprazole  40 mg Oral Daily   sacubitril -valsartan   1 tablet Oral BID   spironolactone   25 mg Oral Daily   Continuous Infusions:  PRN Meds: acetaminophen  **OR** acetaminophen , methocarbamol   Allergies:    Allergies  Allergen Reactions   Sulfa Antibiotics Shortness Of  Breath    Other Reaction(s): Not available   Ace Inhibitors Cough    Pt tolerates lisinopril    Codeine Itching and Swelling    Other Reaction(s): Not available   Ezetimibe     Myalgia  Other Reaction(s): Not available   Penicillins     Unknown reaction     Statins     Body aches  Other Reaction(s): Not available   Tramadol     Numbness  Other Reaction(s): Not available   Neosporin [Neomycin-Bacitracin Zn-Polymyx] Rash    Social History:   Social History   Socioeconomic History   Marital status: Single    Spouse name: Not on file   Number of children: Not on file   Years of education: Not on file   Highest education level: Not on file  Occupational History   Not on file  Tobacco Use   Smoking status: Never    Passive exposure: Never   Smokeless tobacco: Never  Vaping Use   Vaping status: Never Used  Substance and Sexual Activity   Alcohol  use: No   Drug use: No   Sexual activity: Not on file  Other Topics Concern   Not on file  Social History Narrative   Not on file   Social Drivers of Health   Financial Resource Strain: Not on file  Food Insecurity: No Food Insecurity (07/24/2024)   Hunger Vital Sign    Worried About Running Out of Food in the Last Year: Never true    Ran Out of Food in the Last Year: Never true  Transportation Needs: No Transportation Needs (07/24/2024)   PRAPARE - Administrator, Civil Service (Medical): No    Lack of Transportation (Non-Medical): No  Physical Activity: Not on file  Stress: Not on file  Social Connections: Socially Isolated (07/24/2024)   Social Connection and Isolation Panel    Frequency of Communication with Friends and Family: Three times a week    Frequency of Social Gatherings with Friends and Family: Once a week    Attends Religious Services: Patient declined    Database administrator or Organizations: No    Attends Banker Meetings: Never    Marital Status: Divorced  Careers information officer Violence: Not At Risk (07/24/2024)   Humiliation, Afraid, Rape, and Kick questionnaire    Fear of Current or Ex-Partner: No  Emotionally Abused: No    Physically Abused: No    Sexually Abused: No    Family History:    Family History  Problem Relation Age of Onset   Cancer Mother    Cancer Father    Heart attack Brother    Polycystic kidney disease Brother    Cancer Other      ROS:  Please see the history of present illness.   All other ROS reviewed and negative.     Physical Exam/Data: Vitals:   07/25/24 0016 07/25/24 0420 07/25/24 0826 07/25/24 1224  BP: 131/68 128/61 120/69 (!) 101/55  Pulse: 62 (!) 57 (!) 59 88  Resp: 15     Temp: (!) 97.5 F (36.4 C) 98.5 F (36.9 C) (!) 97.5 F (36.4 C) 97.9 F (36.6 C)  TempSrc: Oral Oral    SpO2: 100% 100% 100% 97%  Weight:      Height:        Intake/Output Summary (Last 24 hours) at 07/25/2024 1526 Last data filed at 07/25/2024 1311 Gross per 24 hour  Intake 240 ml  Output --  Net 240 ml      07/24/2024    4:32 PM 05/18/2024   11:10 AM 05/11/2024   12:57 PM  Last 3 Weights  Weight (lbs) 147 lb 11.3 oz 148 lb 6.4 oz 149 lb 7.6 oz  Weight (kg) 67 kg 67.314 kg 67.8 kg     Body mass index is 26.17 kg/m.  General:  Well nourished, well developed, in no acute distress.  Alert and orientated on room air HEENT: normal Neck: no JVD Vascular: No carotid bruits; Distal pulses 2+ bilaterally Cardiac:  normal S1, S2; RRR; no murmur. Lungs:  clear to auscultation bilaterally, no wheezing, rhonchi or rales  Abd: soft, has left flank pain, no hepatomegaly  Ext: no edema Musculoskeletal:  No deformities. Skin: warm and dry  Neuro:   no focal abnormalities noted Psych:  Normal affect   EKG:  The EKG was personally reviewed and demonstrates:  sinus bradycardia with a rate of 59. Telemetry:  Telemetry was personally reviewed and demonstrates: Normal sinus rhythm with heart rates in the 50s to 90s.  Heart rates today  appear to be running in the 80s - 100s.  Had 2 runs of NSVT today at about 1:07 PM.  Relevant CV Studies:   Laboratory Data: High Sensitivity Troponin:   Recent Labs  Lab 07/24/24 1640 07/24/24 1820  TROPONINIHS 3 4     Chemistry Recent Labs  Lab 07/24/24 1640 07/24/24 1641 07/24/24 2337 07/25/24 0319  NA 131* 135  --  134*  K 3.7 3.8  --  3.7  CL 97* 101  --  100  CO2 22  --   --  22  GLUCOSE 244* 234*  --  310*  BUN 56* 56*  --  49*  CREATININE 1.44* 1.60* 1.36* 1.26*  CALCIUM  9.9  --   --  9.4  MG  --   --  1.8  --   GFRNONAA 37*  --  39* 43*  ANIONGAP 12  --   --  12    Recent Labs  Lab 07/24/24 1640  PROT 6.8  ALBUMIN 3.5  AST 35  ALT 75*  ALKPHOS 120  BILITOT 0.7   Lipids No results for input(s): CHOL, TRIG, HDL, LABVLDL, LDLCALC, CHOLHDL in the last 168 hours.  Hematology Recent Labs  Lab 07/24/24 1640 07/24/24 1641 07/24/24 2337 07/25/24 0319  WBC 13.2*  --  10.0 9.1  RBC 4.38  --  4.16 4.15  HGB 13.6 14.6 12.6 12.8  HCT 42.5 43.0 38.8 38.6  MCV 97.0  --  93.3 93.0  MCH 31.1  --  30.3 30.8  MCHC 32.0  --  32.5 33.2  RDW 13.5  --  13.3 13.4  PLT 348  --  296 268   Thyroid   Recent Labs  Lab 07/24/24 2337  TSH 1.675    BNPNo results for input(s): BNP, PROBNP in the last 168 hours.  DDimer No results for input(s): DDIMER in the last 168 hours.  Radiology/Studies:  CT L-SPINE NO CHARGE Result Date: 07/24/2024 EXAM: CT OF THE LUMBAR SPINE WITHOUT CONTRAST 07/24/2024 08:23:40 PM TECHNIQUE: CT of the lumbar spine was performed without the administration of intravenous contrast. Multiplanar reformatted images are provided for review. Automated exposure control, iterative reconstruction, and/or weight based adjustment of the mA/kV was utilized to reduce the radiation dose to as low as reasonably achievable. COMPARISON: None available. CLINICAL HISTORY: Fall, Back pain FINDINGS: BONES AND ALIGNMENT: Normal vertebral body heights.  No acute fracture or suspicious bone lesion. Grade 1 anterolisthesis of L5 on S1. DEGENERATIVE CHANGES: Moderate degenerative changes at L4-L5 and L5-S1. SOFT TISSUES: Evaluated on dedicated CT abdomen/pelvis. IMPRESSION: 1. No acute traumatic injury. 2. Degenerative changes of the lower lumbar spine. Electronically signed by: Pinkie Pebbles MD 07/24/2024 08:31 PM EDT RP Workstation: [id number]   CT T-SPINE NO CHARGE Result Date: 07/24/2024 EXAM: CT THORACIC SPINE WITHOUT CONTRAST 07/24/2024 08:22:53 PM TECHNIQUE: CT of the thoracic spine was performed without the administration of intravenous contrast. Multiplanar reformatted images are provided for review. Automated exposure control, iterative reconstruction, and/or weight based adjustment of the mA/kV was utilized to reduce the radiation dose to as low as reasonably achievable. COMPARISON: None available. CLINICAL HISTORY: Fall, Back pain FINDINGS: BONES AND ALIGNMENT: Normal vertebral body heights. No acute fracture or suspicious bone lesion. Normal alignment. Mild superior endplate changes at T2 and T3, chronic. DEGENERATIVE CHANGES: Mild to moderate degenerative changes of the mid to lower thoracic spine. SOFT TISSUES: Evaluated on dedicated CT chest. IMPRESSION: 1. No acute abnormality of the thoracic spine. Electronically signed by: Pinkie Pebbles MD 07/24/2024 08:29 PM EDT RP Workstation: [id number]   CT Head Wo Contrast Result Date: 07/24/2024 EXAM: CT HEAD WITHOUT CONTRAST 07/24/2024 07:03:19 PM TECHNIQUE: CT of the head was performed without the administration of intravenous contrast. Automated exposure control, iterative reconstruction, and/or weight based adjustment of the mA/kV was utilized to reduce the radiation dose to as low as reasonably achievable. COMPARISON: 03/04/2022 CLINICAL HISTORY: Head trauma, intracranial arterial injury suspected. Pt states she passed out last night and the night before. She hit her head night before and  left flank last night. 10/10 pain in L. Flank. FINDINGS: BRAIN AND VENTRICLES: No acute hemorrhage. No evidence of acute infarct. No hydrocephalus. No extra-axial collection. No mass effect or midline shift. Mild intracranial atherosclerosis. Subcortical and periventricular small vessel ischemic changes. ORBITS: No acute abnormality. SINUSES: No acute abnormality. SOFT TISSUES AND SKULL: No acute soft tissue abnormality. No skull fracture. IMPRESSION: 1. No acute intracranial abnormality. Electronically signed by: Pinkie Pebbles MD 07/24/2024 07:19 PM EDT RP Workstation: [id number]   CT CHEST ABDOMEN PELVIS W CONTRAST Result Date: 07/24/2024 EXAM: CT CHEST, ABDOMEN AND PELVIS WITH CONTRAST 07/24/2024 07:03:19 PM TECHNIQUE: CT of the chest, abdomen and pelvis was performed with the administration of 75 mL of iohexol  (OMNIPAQUE ) 350 MG/ML injection. Multiplanar reformatted images are provided for review. Automated  exposure control, iterative reconstruction, and/or weight based adjustment of the mA/kV was utilized to reduce the radiation dose to as low as reasonably achievable. COMPARISON: CT chest dated 10/29/2022. CLINICAL HISTORY: Polytrauma, blunt. Pt states she passed out last night and the night before. She hit her head night before and left flank last night. 10/10 pain in L. Flank. FINDINGS: CHEST: MEDIASTINUM AND LYMPH NODES: Heart and pericardium are unremarkable. The central airways are clear. No mediastinal, hilar or axillary lymphadenopathy. Thoracic aortic atherosclerosis. LUNGS AND PLEURA: No focal consolidation or pulmonary edema. No pleural effusion or pneumothorax. Mild subpleural scarring versus atelectasis in the lingula and bilateral lower lobes. ABDOMEN AND PELVIS: LIVER: The liver is unremarkable. GALLBLADDER AND BILE DUCTS: Gallbladder is unremarkable. No biliary ductal dilatation. SPLEEN: No acute abnormality. PANCREAS: No acute abnormality. ADRENAL GLANDS: No acute abnormality. KIDNEYS,  URETERS AND BLADDER: No stones in the kidneys or ureters. No hydronephrosis. No perinephric or periureteral stranding. Urinary bladder is unremarkable. GI AND BOWEL: Stomach demonstrates no acute abnormality. There is no bowel obstruction. Normal appendix (image 89). Mild sigmoid diverticulosis, without evidence of diverticulitis. REPRODUCTIVE ORGANS: Status post hysterectomy. PERITONEUM AND RETROPERITONEUM: No ascites. No free air. VASCULATURE: Aorta is normal in caliber. Atherosclerotic calcifications of the abdominal aorta and branch vessels, although patent. ABDOMINAL AND PELVIS LYMPH NODES: No lymphadenopathy. BONES AND SOFT TISSUES: Nondisplaced left lateral 6th through 8th rib fractures. Right hip arthroplasty, without evidence of complication. Degenerative changes of the visualized thoracolumbar spine most prominent at L5-S1. No focal soft tissue abnormality. IMPRESSION: 1. Nondisplaced left lateral 6th8th rib fractures. No pneumothorax. 2. No traumatic injury to the abdomen/pelvis. 3. Additional ancillary findings as above. Electronically signed by: Pinkie Pebbles MD 07/24/2024 07:18 PM EDT RP Workstation: [id number]   CT Cervical Spine Wo Contrast Result Date: 07/24/2024 CLINICAL DATA:  Neck trauma.  Fall last night with head injury. EXAM: CT CERVICAL SPINE WITHOUT CONTRAST TECHNIQUE: Multidetector CT imaging of the cervical spine was performed without intravenous contrast. Multiplanar CT image reconstructions were also generated. RADIATION DOSE REDUCTION: This exam was performed according to the departmental dose-optimization program which includes automated exposure control, adjustment of the mA and/or kV according to patient size and/or use of iterative reconstruction technique. COMPARISON:  Cervical spine radiographs 02/10/2021. FINDINGS: Alignment: Straightening with mild degenerative anterolisthesis at C5-6. Skull base and vertebrae: No evidence of acute cervical spine fracture or traumatic  subluxation. Soft tissues and spinal canal: No prevertebral fluid or swelling. No visible canal hematoma. Disc levels: Multilevel spondylosis with disc space narrowing, uncinate spurring and facet hypertrophy most advanced at C4-5, C5-6 and C6-7. No large disc herniation or high-grade foraminal narrowing demonstrated. Up to moderate foraminal narrowing at C5-6 and C6-7. Upper chest: Clear lung apices. Aortic and great vessel atherosclerosis. Other: Bilateral TMJ degenerative changes. Asymmetric degenerative changes at the left sternoclavicular joint with associated left clavicular head sclerosis. IMPRESSION: 1. No evidence of acute cervical spine fracture, traumatic subluxation or static signs of instability. 2. Multilevel cervical spondylosis as described. 3.  Aortic Atherosclerosis (ICD10-I70.0). Electronically Signed   By: Elsie Perone M.D.   On: 07/24/2024 19:17   DG Chest 2 View Result Date: 07/24/2024 EXAM: 2 VIEW(S) XRAY OF THE CHEST 07/24/2024 06:15:00 PM COMPARISON: CT chest 04/06/2023. CT chest 10/29/2022. CLINICAL HISTORY: fall. L side chest pain, sob, fall FINDINGS: LUNGS AND PLEURA: No focal pulmonary opacity. No pulmonary edema. No pleural effusion. No pneumothorax. HEART AND MEDIASTINUM: No acute abnormality of the cardiac and mediastinal silhouettes. BONES AND SOFT TISSUES: Acute  left sixth and seventh rib fractures. IMPRESSION: 1. Acute left sixth and seventh rib fractures Electronically signed by: Greig Pique MD 07/24/2024 06:35 PM EDT RP Workstation: [id number]     Assessment and Plan: VEDIKA DUMLAO is a 78 y.o. female with a hx of chronic anemia, type 2 diabetes, hypertension, chronic pain, cognitive deficits, GERD, nonischemic cardiomyopathy, and chronic systolic heart failure who is being seen 07/25/2024 for the evaluation of syncope at the request of Erica LAMY MD.  Syncope Rib fracture On interview the patient reported that she had 2 episodes of syncope.  Stated that she was  going to the front door both times that the syncopal episode happened.  Denies any dizziness or lightheadedness prior to the fall.  Both of these episodes happen during the evening.  Patient has been able to get up and walk in the hospital without having any symptoms. She did report poor oral intake and lab work showed an AKI when the patient arrived.  She did fracture her 6 and 8 ribs from the fall.   TSH normal at 1.60 EKG showed sinus bradycardia with a rate of 59. Telemetry shows normal sinus rhythm with heart rates in the 50s to 90s. Order live 14-day ZIO.   Heart failure with recovered LVEF Nonischemic cardiomyopathy On 10/2022 the patient was hospitalized at [facility] for shortness of breath.  Echocardiogram was done and showed a severely reduced LVEF of 15%, moderate pulmonary hypertension, and moderate MR.  Later on 1/24 the patient had a cardiac catheterization that showed mild nonobstructive CAD (30% in the mid to proximal LCx, 50% in the ramus).  A cardiac MRI was done and showed myocardial fibrosis but no other findings of infiltrative disease. Denies any shortness of breath or orthopnea.  Appears euvolemic on exam. Digoxin  levels were low at 0.6. Increased heart rates on telemetry today likely correlate with digoxin  and metoprolol  being held. Has follow-up appointment with heart failure on 08/07/2024 Ordered limited echo. GDMT Stop digoxin  0.0625 mg Continue metoprolol  succinate 25 mg daily Reduce Entresto  to 49-51 twice daily Continue Aldactone  25 mg daily   Mild nonobstructive CAD Hyperlipidemia Cardiac cath on 10/2022 showed mild nonobstructive CAD Continue atorvastatin  10 mg daily. Continue aspirin  81 mg daily.   Type II diabetes Had multiple episodes of hypoglycemia in the 40's while trying Mounjaro.  Mounjaro was discontinued on 06/2024.  Patient did not have any hypoglycemia when arriving to the ER.     Risk Assessment/Risk Scores:     [place]  Heart  Association (NYHA) Functional Class NYHA Class II       For questions or updates, please contact Erica Cervantes Please consult [email or web address] for contact info under     Signed, Morse Clause, PA-C  07/25/2024 3:26 PM

## 2024-07-25 NOTE — Plan of Care (Signed)
 Patient arrived to unit A&O X4. Scattered abrasion and scabs. Patient able to ambulate one person assist to bathroom. Patient left with call bell in reach, bed in lowest position and side rails up.  Problem: Education: Goal: Knowledge of General Education information will improve Description: Including pain rating scale, medication(s)/side effects and non-pharmacologic comfort measures Outcome: Progressing   Problem: Health Behavior/Discharge Planning: Goal: Ability to manage health-related needs will improve Outcome: Progressing   Problem: Activity: Goal: Risk for activity intolerance will decrease Outcome: Progressing   Problem: Nutrition: Goal: Adequate nutrition will be maintained Outcome: Progressing   Problem: Coping: Goal: Level of anxiety will decrease Outcome: Progressing

## 2024-07-26 ENCOUNTER — Other Ambulatory Visit (HOSPITAL_COMMUNITY): Payer: Self-pay

## 2024-07-26 ENCOUNTER — Encounter: Payer: Self-pay | Admitting: Oncology

## 2024-07-26 ENCOUNTER — Other Ambulatory Visit: Payer: Self-pay | Admitting: Student

## 2024-07-26 ENCOUNTER — Observation Stay (HOSPITAL_COMMUNITY)

## 2024-07-26 ENCOUNTER — Telehealth (HOSPITAL_COMMUNITY): Payer: Self-pay

## 2024-07-26 ENCOUNTER — Observation Stay (HOSPITAL_COMMUNITY): Admit: 2024-07-26 | Discharge: 2024-07-26 | Disposition: A

## 2024-07-26 DIAGNOSIS — R55 Syncope and collapse: Secondary | ICD-10-CM

## 2024-07-26 DIAGNOSIS — I5022 Chronic systolic (congestive) heart failure: Secondary | ICD-10-CM | POA: Diagnosis not present

## 2024-07-26 DIAGNOSIS — I2510 Atherosclerotic heart disease of native coronary artery without angina pectoris: Secondary | ICD-10-CM | POA: Diagnosis not present

## 2024-07-26 DIAGNOSIS — I509 Heart failure, unspecified: Secondary | ICD-10-CM | POA: Diagnosis not present

## 2024-07-26 DIAGNOSIS — E785 Hyperlipidemia, unspecified: Secondary | ICD-10-CM | POA: Diagnosis not present

## 2024-07-26 LAB — BASIC METABOLIC PANEL WITH GFR
Anion gap: 11 (ref 5–15)
BUN: 34 mg/dL — ABNORMAL HIGH (ref 8–23)
CO2: 23 mmol/L (ref 22–32)
Calcium: 10 mg/dL (ref 8.9–10.3)
Chloride: 102 mmol/L (ref 98–111)
Creatinine, Ser: 1.17 mg/dL — ABNORMAL HIGH (ref 0.44–1.00)
GFR, Estimated: 47 mL/min — ABNORMAL LOW (ref >60)
Glucose, Bld: 170 mg/dL — ABNORMAL HIGH (ref 70–99)
Potassium: 4.1 mmol/L (ref 3.5–5.1)
Sodium: 136 mmol/L (ref 135–145)

## 2024-07-26 LAB — CBC
HCT: 41.1 % (ref 36.0–46.0)
Hemoglobin: 13.3 g/dL (ref 12.0–15.0)
MCH: 30.5 pg (ref 26.0–34.0)
MCHC: 32.4 g/dL (ref 30.0–36.0)
MCV: 94.3 fL (ref 80.0–100.0)
Platelets: 269 K/uL (ref 150–400)
RBC: 4.36 MIL/uL (ref 3.87–5.11)
RDW: 13.3 % (ref 11.5–15.5)
WBC: 9.9 K/uL (ref 4.0–10.5)
nRBC: 0 % (ref 0.0–0.2)

## 2024-07-26 LAB — ECHOCARDIOGRAM LIMITED
AR max vel: 2.06 cm2
AV Area VTI: 2.19 cm2
AV Area mean vel: 1.92 cm2
AV Mean grad: 3 mmHg
AV Peak grad: 6.6 mmHg
Ao pk vel: 1.28 m/s
Area-P 1/2: 3.23 cm2
Calc EF: 61.5 %
Height: 63 in
MV VTI: 1.96 cm2
P 1/2 time: 834 ms
S' Lateral: 2.8 cm
Single Plane A2C EF: 63.3 %
Single Plane A4C EF: 57.4 %
Weight: 2363.33 [oz_av]

## 2024-07-26 LAB — GLUCOSE, CAPILLARY
Glucose-Capillary: 175 mg/dL — ABNORMAL HIGH (ref 70–99)
Glucose-Capillary: 251 mg/dL — ABNORMAL HIGH (ref 70–99)

## 2024-07-26 MED ORDER — LIDOCAINE 4 % EX PTCH
1.0000 | MEDICATED_PATCH | Freq: Every day | CUTANEOUS | 0 refills | Status: AC | PRN
  Filled 2024-07-26: qty 14, 14d supply, fill #0

## 2024-07-26 MED ORDER — ACETAMINOPHEN 500 MG PO TABS
1000.0000 mg | ORAL_TABLET | Freq: Three times a day (TID) | ORAL | 0 refills | Status: AC
  Filled 2024-07-26: qty 42, 7d supply, fill #0

## 2024-07-26 MED ORDER — OXYCODONE HCL 5 MG PO TABS
5.0000 mg | ORAL_TABLET | Freq: Four times a day (QID) | ORAL | 0 refills | Status: AC | PRN
Start: 2024-07-26 — End: ?
  Filled 2024-07-26: qty 20, 5d supply, fill #0

## 2024-07-26 MED ORDER — METHOCARBAMOL 500 MG PO TABS
500.0000 mg | ORAL_TABLET | Freq: Three times a day (TID) | ORAL | 0 refills | Status: AC | PRN
  Filled 2024-07-26: qty 30, 10d supply, fill #0

## 2024-07-26 MED ORDER — SACUBITRIL-VALSARTAN 49-51 MG PO TABS
1.0000 | ORAL_TABLET | Freq: Two times a day (BID) | ORAL | 0 refills | Status: AC
  Filled 2024-07-26: qty 60, 30d supply, fill #0

## 2024-07-26 MED ORDER — LINAGLIPTIN 5 MG PO TABS
5.0000 mg | ORAL_TABLET | Freq: Every day | ORAL | 0 refills | Status: AC
  Filled 2024-07-26: qty 30, 30d supply, fill #0

## 2024-07-26 NOTE — Progress Notes (Signed)
  Progress Note  Patient Name: LEENAH SEIDNER Date of Encounter: 07/26/2024 [facility] Cardiologist: Oneil Parchment, MD   Interval Summary   No events overnight. She walked yesterday down the hall and had no issues. She continues to have some rib pain.   Vital Signs Vitals:   07/25/24 0826 07/25/24 1224 07/25/24 1548 07/25/24 2100  BP: 120/69 (!) 101/55 (!) 93/55 126/64  Pulse: (!) 59 88 85 82  Resp:      Temp: (!) 97.5 F (36.4 C) 97.9 F (36.6 C) (!) 97.3 F (36.3 C) 98.6 F (37 C)  TempSrc:    Oral  SpO2: 100% 97% 100% 98%  Weight:      Height:        Intake/Output Summary (Last 24 hours) at 07/26/2024 0657 Last data filed at 07/25/2024 1311 Gross per 24 hour  Intake 240 ml  Output --  Net 240 ml      07/24/2024    4:32 PM 05/18/2024   11:10 AM 05/11/2024   12:57 PM  Last 3 Weights  Weight (lbs) 147 lb 11.3 oz 148 lb 6.4 oz 149 lb 7.6 oz  Weight (kg) 67 kg 67.314 kg 67.8 kg      Telemetry/ECG  NSR - Personally Reviewed  Physical Exam  GEN: No acute distress.   Neck: No JVD Cardiac: RRR, no murmurs, rubs, or gallops.  Respiratory: Clear to auscultation bilaterally. GI: Soft, nontender, non-distended  MS: No edema  Assessment & Plan  Ms Vinciguerra is a 47 yoF with Hx of NICM (EF 15% --> 40-45% 03/25) and HTN who is presenting with 2 episodes of syncope c/b rib fractures.   #Syncope #HFimprovedEF #Sinus bradycardia #AKI - Presented with syncope and found to have some sinus bradycardia 59. Ddx includes orthostatic hypotension given improvement in AKI with IVF vs. Tachy-arrhythmias. TSH N.  - Discontinued digoxin  on 10/14 and reduced Entresto  to 49/51 - Follow up with limited echo and plan for 14-day live monitor on d/c - Cont ASA 81, lipitor 10, Lasix  40 daily, metoprolol  25 daily, spironolactone  25 mg daily; not on SGLT2 due to severe UTI infection not on GLP-1 due to nausea and fatigue  - We will sign off; feel free to call us  back if you have any  questions or concerns  For questions or updates, please contact [facility] Please consult [email or web address] for contact info under   Signed, Joelle VEAR Ren Donley, MD

## 2024-07-26 NOTE — Evaluation (Signed)
 Occupational Therapy Evaluation Patient Details Name: Erica Cervantes MRN: [id number] DOB: Dec 29, 1945 Today's Date: 07/26/2024   History of Present Illness   Pt is a 78 y.o. F who presents 07/24/2024 with syncopal episodes. Significant PMH: DM2, HTN, CKD3a, cognitive deficits, chronic pain, fibromyalgia, R THA, chronic HFrEF.     Clinical Impressions Prior to this admission, patient living alone, receiving occasional assist for IADL assist, but otherwise independent. Currently, patient with L rib pain, negative orthostatics (see below) and set up for ADL management. OT will continue to follow in the hospital, however patient will not need OT at discharge.   BP in supine: 131/62 (80) BP in sitting: 119/68 (82) BP in standing 0 minutes: 117/74 (87) BP in standing 4 minutes: 119/72 (81)     If plan is discharge home, recommend the following:   Help with stairs or ramp for entrance;Assist for transportation (initially)     Functional Status Assessment   Patient has had a recent decline in their functional status and demonstrates the ability to make significant improvements in function in a reasonable and predictable amount of time.     Equipment Recommendations   None recommended by OT     Recommendations for Other Services         Precautions/Restrictions   Precautions Precautions: Fall Restrictions Weight Bearing Restrictions Per Provider Order: No     Mobility Bed Mobility Overal bed mobility: Needs Assistance Bed Mobility: Supine to Sit     Supine to sit: Supervision     General bed mobility comments: Exiting R side of bed, no physical assist required, increased time    Transfers Overall transfer level: Needs assistance Equipment used: None Transfers: Sit to/from Stand Sit to Stand: Supervision           General transfer comment: minimal increased time      Balance Overall balance assessment: Mild deficits observed, not formally tested                                          ADL either performed or assessed with clinical judgement   ADL Overall ADL's : Needs assistance/impaired Eating/Feeding: Independent   Grooming: Supervision/safety;Wash/dry hands;Standing   Upper Body Bathing: Set up;Sitting   Lower Body Bathing: Set up;Sitting/lateral leans;Sit to/from stand   Upper Body Dressing : Set up;Sitting   Lower Body Dressing: Set up;Sitting/lateral leans;Sit to/from stand Lower Body Dressing Details (indicate cue type and reason): increased time due to rib fractures Toilet Transfer: Supervision/safety;Ambulation   Toileting- Clothing Manipulation and Hygiene: Supervision/safety;Sitting/lateral lean;Sit to/from stand       Functional mobility during ADLs: Set up General ADL Comments: Prior to this admission, patient living alone, receiving occasional assist for IADL assist, but otherwise independent. Currently, patient with L rib pain, negative orthostatics (see below) and set up for ADL management. OT will continue to follow in the hospital, however patient will not need OT at discharge.     Vision Baseline Vision/History: 0 No visual deficits Ability to See in Adequate Light: 0 Adequate Patient Visual Report: No change from baseline Vision Assessment?: No apparent visual deficits     Perception Perception: Not tested       Praxis Praxis: Not tested       Pertinent Vitals/Pain Pain Assessment Pain Assessment: Faces Faces Pain Scale: Hurts little more Pain Location: L ribs Pain Descriptors / Indicators: Discomfort, Grimacing, Guarding  Extremity/Trunk Assessment Upper Extremity Assessment Upper Extremity Assessment: Right hand dominant;Generalized weakness   Lower Extremity Assessment Lower Extremity Assessment: Overall WFL for tasks assessed   Cervical / Trunk Assessment Cervical / Trunk Assessment: Normal   Communication Communication Communication: No apparent  difficulties   Cognition Arousal: Alert Behavior During Therapy: WFL for tasks assessed/performed Cognition: No apparent impairments                               Following commands: Intact       Cueing  General Comments   Cueing Techniques: Verbal cues      Exercises     Shoulder Instructions      Home Living Family/patient expects to be discharged to:: Private residence Living Arrangements: Alone Available Help at Discharge: Family;Available PRN/intermittently (Son intermittently available.) Type of Home: House Home Access: Stairs to enter [employer] of Steps: 1 Entrance Stairs-Rails: Right;Left Home Layout: One level     Bathroom Shower/Tub: Walk-in shower;Tub/shower unit (Has tub bench available.)         Home Equipment: Rolling Walker (2 wheels);Rollator (4 wheels);Shower seat;Cane - single point;BSC/3in1;Wheelchair - manual;Other (comment) (adjustable bed with no rails)          Prior Functioning/Environment Prior Level of Function : Needs assist             Mobility Comments: uses cane prn ADLs Comments: Some assist for IADLs and getting grocieries.    OT Problem List: Decreased activity tolerance;Impaired balance (sitting and/or standing)   OT Treatment/Interventions: Self-care/ADL training;Therapeutic exercise;Manual therapy;Therapeutic activities;Balance training;Patient/family education;Energy conservation      OT Goals(Current goals can be found in the care plan section)   Acute Rehab OT Goals Patient Stated Goal: to get better OT Goal Formulation: With patient Time For Goal Achievement: 08/09/24 Potential to Achieve Goals: Good ADL Goals Pt Will Perform Lower Body Bathing: with modified independence;sitting/lateral leans;sit to/from stand Pt Will Perform Lower Body Dressing: with modified independence;sit to/from stand;sitting/lateral leans Pt Will Transfer to Toilet: with modified  independence;ambulating;regular height toilet Pt/caregiver will Perform Home Exercise Program: Increased strength;With written HEP provided;Both right and left upper extremity Additional ADL Goal #1: Patient will be able to verbalize 3 strategies for energy conservation and fall prevention in order to promote increased safety at home.   OT Frequency:  Min 2X/week    Co-evaluation              AM-PAC OT 6 Clicks Daily Activity     Outcome Measure Help from another person eating meals?: A Little Help from another person taking care of personal grooming?: A Little Help from another person toileting, which includes using toliet, bedpan, or urinal?: A Little Help from another person bathing (including washing, rinsing, drying)?: A Little Help from another person to put on and taking off regular upper body clothing?: A Little Help from another person to put on and taking off regular lower body clothing?: A Little 6 Click Score: 18   End of Session Nurse Communication: Mobility status  Activity Tolerance: Patient tolerated treatment well Patient left: in chair;with call bell/phone within reach;with chair alarm set;Other (comment) (Case Manager present)  OT Visit Diagnosis: Unsteadiness on feet (R26.81);Muscle weakness (generalized) (M62.81);History of falling (Z91.81);Pain Pain - Right/Left: Left Pain - part of body:  (Ribs)                Time: 8962-8897 OT Time Calculation (min): 25 min Charges:  OT  General Charges $OT Visit: 1 Visit OT Evaluation $OT Eval Moderate Complexity: 1 Mod OT Treatments $Self Care/Home Management : 8-22 mins  Ronal Gift E. Krishna Dancel, OTR/L Acute Rehabilitation Services [phone]   Ronal Gift Salt 07/26/2024, 12:52 PM

## 2024-07-26 NOTE — Discharge Summary (Addendum)
 Physician Discharge Summary  Erica Cervantes FMW:[id number] DOB: 01-Jan-1946 DOA: 07/24/2024  PCP: Toribio Jerel [name], MD  Admit date: 07/24/2024 Discharge date: 07/26/2024  Admitted from: Home Discharge disposition: Home with home health PT  Recommendations at discharge:  Per cardiology recommendation, digoxin  has been stopped.  Entresto  dose has been reduced.  Discharge med list -Toprol  25 mg daily, Entresto  49-51 mg twice daily Lasix  40 mg daily, Aldactone  25 mg daily For rib fracture pain, use Tylenol  scheduled, oxycodone  as needed, Robaxin  as needed and Lidoderm  patch. Continue metformin as before.  Tradjenta 5 mg daily has been added.  Need to follow-up with endocrinologist as an outpatient for better antidiabetic regimen. Follow-up with cardiologist for device monitoring   Subjective: Patient was seen and examined this morning. Pleasant elderly Caucasian female.  Propped up in bed.  Taking her breakfast.  Not in distress.  I called her daughter Ms. [name] from bedside and updated her. No events overnight.  Ambulated yesterday without issues. In the last 24 hours, Afebrile, hemodynamically stable, breathing on room air No labs today  Brief narrative: MARVENE Cervantes is a 78 y.o. female with PMH significant for DM2, HTN, CAD, peripheral neuropathy, GERD, depression, chronic pain/fibromyalgia 10/13, patient brought to ED for syncopal episodes, had 2 syncopal episodes in the last 3 days. First was while she was walking to open the door and suddenly passed out without any prodrome symptoms, briefly gained consciousness.  Next day, she had similar sudden loss of consciousness.  She did hit her head and chest on the vacuum cleaner and hence presented to ED.  Patient reports difficulty controlling her blood sugar level.  She used to be on glipizide and Jardiance which were recently switched to metformin a week ago because of recurrent hypoglycemic episodes and vaginal rash.  With metformin, she  has had few episodes of diarrhea.  Patient also reports that she stopped taking gabapentin  a week ago because of nightmares.  In the ED, febrile, hemodynamically stable. Skeletal survey done with CT head, chest abdomen pelvis and C-spine T-spine and L-spine.   Found to have fractures involving the left 6th through 8th rib.   EKG showed sinus bradycardia heart rate is around 59 bpm.   Labs with WBC count 13.2, creatinine 1.4 Started on IV fluid Admitted to [facility] course: Syncopal episodes Presented with 2 syncopal episodes in 3 days without any prodromal symptoms.  Specifically no chest pain, palpitation, dizziness EKG was sinus bradycardia in 50s. Cardiology consulted Suspected orthostatic syncope but orthostatic vital signs negative on 10/14, probably because she was already given IV hydration. Echocardiogram with EF 50 to 55%, G1 DD. Cardiology recommended for 14-day live monitor on discharge Med adjustments as below.  Chronic systolic CHF Last known echo from March 2025 with EF 40 to 45%.  Repeat echo this admission showed improvement in EF to 50 to 55%. PTA meds- Toprol , digoxin , Entresto , Lasix , Aldactone  Given high suspicion of orthostatic hypotension, medicines have been adjusted. Digoxin  has been discontinued, and Entresto  dose has been reduced Per cardio recommendation, patient to be discharged on Toprol  25 mg daily, Entresto  49-51 mg twice daily, Lasix  40 mg daily, Aldactone  25 mg daily  AKI on CKD 3b Baseline creatinine less than 1.3. Patient with creatinine elevated to 1.6, gradually improving with IV fluid and meds adjustment Continue to monitor  Recent Labs    12/13/23 1220 04/06/24 1414 07/24/24 1640 07/24/24 1641 07/24/24 2337 07/25/24 0319 07/26/24 0749  BUN 16 23 56* 56*  --  49* 34*  CREATININE 1.28* 1.17* 1.44* 1.60* 1.36* 1.26* 1.17*  CO2 25 24 22   --   --  22 23   Hyponatremia Mild.  Continue to monitor Recent Labs  Lab 07/24/24 1640  07/24/24 1641 07/25/24 0319 07/26/24 0749  NA 131* 135 134* 136   Left 6th-8th rib fractures Secondary to fall from syncope.   Currently not requiring supplemental oxygen Needs adequate pain control to minimize risk of pneumonia Continue incentive spirometry Pain regimen --- Scheduled: Tylenol , lidocaine  patch --- PRN: Robaxin ,  Type 2 diabetes mellitus with hyperglycemia A1c 7.4 on 07/24/2024 Patient reports difficulty controlling her blood sugar level.  She used to be on glipizide and Jardiance which were recently switched to metformin a week ago because of recurrent hypoglycemic episodes and vaginal rash.  She reports she could not continue Mounjaro because of nausea. Lately she has been resumed on metformin and so far she has tolerated well except for a day of diarrhea.  She states she would like to continue metformin for now and follow-up with endocrinologist as an outpatient. Diabetes care coordinator consulted.  Patient has also been started on Tradjenta 5 mg daily. Recent Labs  Lab 07/25/24 0817 07/25/24 1224 07/25/24 1547 07/26/24 0841  GLUCAP 148* 236* 182* 175*   HLD Continue statins.   GERD cont PPI  H/o chronic pain, fibromyalgia, neuropathy Depression Reports stopping gabapentin  last 1 week due to nightmares. Continue BuSpar , Cymbalta    Cognitive impairment Aricept  held due to bradycardia   H/o ADHD Cont Adderall.   Multiple skin lesions from picking.  Does not look infected.  Impaired mobility/falls Seen by PT.  Home with PT recommended  Diet:  Diet Order             Diet general           Diet heart healthy/carb modified Room service appropriate? Yes; Fluid consistency: Thin  Diet effective now                   Nutritional status:  Body mass index is 26.17 kg/m.       Wounds:  - Wound 07/24/24 2220 Atypical Arm Anterior;Left;Lower;Posterior;Mid;Upper (Active)  Date First Assessed/Time First Assessed: 07/24/24 2220   Present  on Original Admission: Yes  Primary Wound Type: Atypical  Secondary Wound Type - Atypical: (c) Other (Comments)  Location: Arm  Location Orientation: Anterior;Left;Lower;Posterior;Mid;Upper    Assessments 07/24/2024 10:15 PM 07/25/2024  9:00 PM  Wound Image     Site / Wound Assessment Clean Clean;Bleeding  Peri-wound Assessment Erythema (blanchable);Pink --  Drainage Amount None --  Dressing Type -- Gauze (Comment)  Dressing Changed -- Changed     No associated orders.    Discharge Medications:   Allergies as of 07/26/2024       Reactions   Sulfa Antibiotics Shortness Of Breath   Other Reaction(s): Not available   Ace Inhibitors Cough   Pt tolerates lisinopril    Codeine Itching, Swelling   Other Reaction(s): Not available   Ezetimibe    Myalgia Other Reaction(s): Not available   Penicillins    Unknown reaction    Statins    Body aches Other Reaction(s): Not available   Tramadol    Numbness Other Reaction(s): Not available   Neosporin [neomycin-bacitracin Zn-polymyx] Rash        Medication List     STOP taking these medications    digoxin  0.125 MG tablet Commonly known as: LANOXIN    donepezil  5 MG tablet Commonly known  as: ARICEPT    doxycycline  100 MG tablet Commonly known as: VIBRA -TABS   Entresto  97-103 MG Generic drug: sacubitril -valsartan  Replaced by: sacubitril -valsartan  49-51 MG   Mounjaro 2.5 MG/0.5ML Pen Generic drug: tirzepatide   OneTouch Delica Plus Lancet30G Misc       TAKE these medications    acetaminophen  500 MG tablet Commonly known as: TYLENOL  Take 2 tablets (1,000 mg total) by mouth every 8 (eight) hours for 7 days.   amphetamine-dextroamphetamine 20 MG tablet Commonly known as: ADDERALL Take 20-40 mg by mouth See admin instructions. Take 40 mg (2 tablets) by mouth in the morning and 20 mg (1 tablet) at 2 pm   Aspirin  Low Dose 81 MG tablet Generic drug: aspirin  EC Take 1 tablet (81 mg total) by mouth daily. Swallow  whole.   atorvastatin  10 MG tablet Commonly known as: LIPITOR Take 10 mg by mouth See admin instructions. Take 1 tablet by mouth every Sunday and Wednesday   busPIRone 30 MG tablet Commonly known as: BUSPAR Take 30 mg by mouth 3 (three) times daily.   cholecalciferol 25 MCG (1000 UNIT) tablet Commonly known as: VITAMIN D3 Take 2,000 Units by mouth daily.   cycloSPORINE 0.05 % ophthalmic emulsion Commonly known as: RESTASIS Place 2 drops into both eyes 2 (two) times daily.   DULoxetine 60 MG capsule Commonly known as: CYMBALTA Take 120 mg by mouth daily at 12 noon.   folic acid 800 MCG tablet Commonly known as: FOLVITE Take 800-2,400 mcg by mouth daily.   furosemide 40 MG tablet Commonly known as: LASIX Take 40 mg by mouth daily.   lidocaine 4 % Place 1 patch onto the skin daily as needed (pain).   linagliptin 5 MG Tabs tablet Commonly known as: Tradjenta Take 1 tablet (5 mg total) by mouth daily.   metformin 500 MG (OSM) 24 hr tablet Commonly known as: FORTAMET Take 500 mg by mouth daily with breakfast.   methocarbamol 500 MG tablet Commonly known as: ROBAXIN Take 1 tablet (500 mg total) by mouth every 8 (eight) hours as needed for muscle spasms.   metoprolol succinate 25 MG 24 hr tablet Commonly known as: Toprol XL Take 1 tablet (25 mg total) by mouth daily.   Mucinex Maximum Strength 1200 MG Tb12 Generic drug: Guaifenesin Take 1,200 mg by mouth daily as needed (severe congestion).   omeprazole 20 MG capsule Commonly known as: PRILOSEC Take 20 mg by mouth daily.   OneTouch Verio test strip Generic drug: glucose blood 1 each daily.   oxyCODONE 5 MG immediate release tablet Commonly known as: Roxicodone Take 1 tablet (5 mg total) by mouth every 6 (six) hours as needed for severe pain (pain score 7-10).   sacubitril-valsartan 49-51 MG Commonly known as: ENTRESTO Take 1 tablet by mouth 2 (two) times daily. Replaces: Entresto 97-103 MG    spironolactone 25 MG tablet Commonly known as: ALDACTONE Take 1 tablet (25 mg total) by mouth daily.   triamcinolone cream 0.1 % Commonly known as: KENALOG Apply 1 application. topically 3 (three) times daily as needed for itching or rash.               Discharge Care Instructions  (From admission, onward)           Start     Ordered   07/26/24 0000  Discharge wound care:        10 /15/25 1040             Follow ups:    Follow-up  Information     Toribio Jerel MATSU, MD Follow up.   Specialty: Family Medicine Contact information: [address] [place] [address] [phone]                 Discharge Instructions:   Discharge Instructions     Call MD for:  difficulty breathing, headache or visual disturbances   Complete by: As directed    Call MD for:  extreme fatigue   Complete by: As directed    Call MD for:  hives   Complete by: As directed    Call MD for:  persistant dizziness or light-headedness   Complete by: As directed    Call MD for:  persistant nausea and vomiting   Complete by: As directed    Call MD for:  severe uncontrolled pain   Complete by: As directed    Call MD for:  temperature >100.4   Complete by: As directed    Diet general   Complete by: As directed    Discharge instructions   Complete by: As directed    Recommendations at discharge:   Per cardiology recommendation, digoxin  has been stopped.  Entresto  dose has been reduced.  Discharge med list -Toprol  25 mg daily, Entresto  49-51 mg twice daily Lasix  40 mg daily, Aldactone  25 mg daily  For rib fracture pain, use Tylenol  scheduled, oxycodone  as needed, Robaxin  as needed and Lidoderm  patch.  Continue metformin as before.  Tradjenta 5 mg daily has been added.  Need to follow-up with endocrinologist as an outpatient for better antidiabetic regimen.  Follow-up with cardiologist for device monitoring   General discharge instructions: Follow with Primary MD Toribio Jerel MATSU, MD in 7 days  Please request your PCP  to go over your hospital tests, procedures, radiology results at the follow up. Please get your medicines reviewed and adjusted.  Your PCP may decide to repeat certain labs or tests as needed. Do not drive, operate heavy machinery, perform activities at heights, swimming or participation in water  activities or provide baby sitting services if your were admitted for syncope or siezures until you have seen by Primary MD or a Neurologist and advised to do so again. [place]  Controlled Substance Reporting System database was reviewed. Do not drive, operate heavy machinery, perform activities at heights, swim, participate in water  activities or provide baby-sitting services while on medications for pain, sleep and mood until your outpatient physician has reevaluated you and advised to do so again.  You are strongly recommended to comply with the dose, frequency and duration of prescribed medications. Activity: As tolerated with Full fall precautions use walker/cane & assistance as needed Avoid using any recreational substances like cigarette, tobacco, alcohol , or non-prescribed drug. If you experience worsening of your admission symptoms, develop shortness of breath, life threatening emergency, suicidal or homicidal thoughts you must seek medical attention immediately by calling 911 or calling your MD immediately  if symptoms less severe. You must read complete instructions/literature along with all the possible adverse reactions/side effects for all the medicines you take and that have been prescribed to you. Take any new medicine only after you have completely understood and accepted all the possible adverse reactions/side effects.  Wear Seat belts while driving. You were cared for by a hospitalist during your hospital stay. If you have any questions about your discharge medications or the care you received while you were in the hospital after you are discharged,  you can call the unit and ask to speak  with the hospitalist or the covering physician. Once you are discharged, your primary care physician will handle any further medical issues. Please note that NO REFILLS for any discharge medications will be authorized once you are discharged, as it is imperative that you return to your primary care physician (or establish a relationship with a primary care physician if you do not have one).   Discharge wound care:   Complete by: As directed    Increase activity slowly   Complete by: As directed        Discharge Exam:   Vitals:   07/25/24 1224 07/25/24 1548 07/25/24 2100 07/26/24 0842  BP: (!) 101/55 (!) 93/55 126/64 (!) 119/52  Pulse: 88 85 82 63  Resp:      Temp: 97.9 F (36.6 C) (!) 97.3 F (36.3 C) 98.6 F (37 C) (!) 97.5 F (36.4 C)  TempSrc:   Oral   SpO2: 97% 100% 98% 97%  Weight:      Height:        Body mass index is 26.17 kg/m.  General exam: Pleasant, elderly Caucasian female. Skin: No rashes, lesions or ulcers. HEENT: Atraumatic, normocephalic, no obvious bleeding Lungs: Clear to auscultation bilaterally,  CVS: S1, S2, no murmur,   GI/Abd: Soft, nontender, nondistended, bowel sound present,   CNS: Alert, awake, oriented x 3 Psychiatry: Mood appropriate Extremities: No pedal edema, no calf tenderness,    The results of significant diagnostics from this hospitalization (including imaging, microbiology, ancillary and laboratory) are listed below for reference.    Procedures and Diagnostic Studies:   ECHOCARDIOGRAM LIMITED Result Date: 07/26/2024    ECHOCARDIOGRAM LIMITED REPORT   Patient Name:   Erica Cervantes [place] Date of Exam: 07/26/2024 Medical Rec #:  [id number]    Height:       63.0 in Accession #:    [id number]   Weight:       147.7 lb Date of Birth:  1946/04/29    BSA:          1.700 m Patient Age:    78 years     BP:           126/64 mmHg Patient Gender: F            HR:           61 bpm. Exam Location:  Inpatient Procedure:  2D Echo, Limited Echo, Cardiac Doppler and Color Doppler (Both            Spectral and Color Flow Doppler were utilized during procedure). Indications:    CHF I50.9  History:        Patient has prior history of Echocardiogram examinations, most                 recent 12/24/2023. CHF, Signs/Symptoms:Syncope; Risk                 Factors:Dyslipidemia and Diabetes.  Sonographer:    BERNARDA ROCKS Referring Phys: [id number] ZANE ADAMS IMPRESSIONS  1. Left ventricular ejection fraction, by estimation, is 50 to 55%. The left ventricle has low normal function. The left ventricle has no regional wall motion abnormalities. Left ventricular diastolic parameters are consistent with Grade I diastolic dysfunction (impaired relaxation).  2. Right ventricular systolic function is normal. The right ventricular size is normal.  3. The mitral valve is normal in structure. Trivial mitral valve regurgitation. No evidence of mitral stenosis.  4. The aortic valve is tricuspid. Aortic valve regurgitation is trivial. No aortic stenosis  is present.  5. The inferior vena cava is normal in size with greater than 50% respiratory variability, suggesting right atrial pressure of 3 mmHg. FINDINGS  Left Ventricle: Left ventricular ejection fraction, by estimation, is 50 to 55%. The left ventricle has low normal function. The left ventricle has no regional wall motion abnormalities. The left ventricular internal cavity size was normal in size. There is no left ventricular hypertrophy. Left ventricular diastolic parameters are consistent with Grade I diastolic dysfunction (impaired relaxation). Right Ventricle: The right ventricular size is normal. Right ventricular systolic function is normal. Left Atrium: Left atrial size was normal in size. Right Atrium: Right atrial size was normal in size. Pericardium: Trivial pericardial effusion is present. Mitral Valve: The mitral valve is normal in structure. Trivial mitral valve regurgitation. No evidence of  mitral valve stenosis. MV peak gradient, 4.2 mmHg. The mean mitral valve gradient is 1.0 mmHg. Tricuspid Valve: The tricuspid valve is normal in structure. Tricuspid valve regurgitation is trivial. No evidence of tricuspid stenosis. Aortic Valve: The aortic valve is tricuspid. Aortic valve regurgitation is trivial. Aortic regurgitation PHT measures 834 msec. No aortic stenosis is present. Aortic valve mean gradient measures 3.0 mmHg. Aortic valve peak gradient measures 6.6 mmHg. Aortic valve area, by VTI measures 2.19 cm. Pulmonic Valve: The pulmonic valve was normal in structure. Pulmonic valve regurgitation is not visualized. No evidence of pulmonic stenosis. Aorta: The aortic root is normal in size and structure. Venous: The inferior vena cava is normal in size with greater than 50% respiratory variability, suggesting right atrial pressure of 3 mmHg. IAS/Shunts: No atrial level shunt detected by color flow Doppler. LEFT VENTRICLE PLAX 2D LVIDd:         4.50 cm     Diastology LVIDs:         2.80 cm     LV e' medial:    7.72 cm/s LV PW:         0.80 cm     LV E/e' medial:  10.0 LV IVS:        0.90 cm     LV e' lateral:   7.40 cm/s LVOT diam:     1.70 cm     LV E/e' lateral: 10.4 LV SV:         53 LV SV Index:   31 LVOT Area:     2.27 cm  LV Volumes (MOD) LV vol d, MOD A2C: 69.5 ml LV vol d, MOD A4C: 98.8 ml LV vol s, MOD A2C: 25.5 ml LV vol s, MOD A4C: 42.1 ml LV SV MOD A2C:     44.0 ml LV SV MOD A4C:     98.8 ml LV SV MOD BP:      52.2 ml RIGHT VENTRICLE             IVC RV Basal diam:  3.20 cm     IVC diam: 1.30 cm RV S prime:     12.10 cm/s TAPSE (M-mode): 1.6 cm LEFT ATRIUM             Index        RIGHT ATRIUM           Index LA diam:        1.80 cm 1.06 cm/m   RA Area:     10.90 cm LA Vol (A2C):   22.0 ml 12.94 ml/m  RA Volume:   25.10 ml  14.77 ml/m LA Vol (A4C):   15.0 ml 8.82 ml/m LA  Biplane Vol: 18.3 ml 10.77 ml/m  AORTIC VALVE                    PULMONIC VALVE AV Area (Vmax):    2.06 cm     PV  Vmax:          1.04 m/s AV Area (Vmean):   1.92 cm     PV Peak grad:     4.3 mmHg AV Area (VTI):     2.19 cm     PR End Diast Vel: 2.03 msec AV Vmax:           128.00 cm/s AV Vmean:          78.400 cm/s AV VTI:            0.242 m AV Peak Grad:      6.6 mmHg AV Mean Grad:      3.0 mmHg LVOT Vmax:         116.00 cm/s LVOT Vmean:        66.400 cm/s LVOT VTI:          0.233 m LVOT/AV VTI ratio: 0.96 AI PHT:            834 msec  AORTA Ao Root diam: 3.50 cm Ao Asc diam:  3.60 cm MITRAL VALVE MV Area (PHT): 3.23 cm    SHUNTS MV Area VTI:   1.96 cm    Systemic VTI:  0.23 m MV Peak grad:  4.2 mmHg    Systemic Diam: 1.70 cm MV Mean grad:  1.0 mmHg MV Vmax:       1.02 m/s MV Vmean:      44.6 cm/s MV Decel Time: 235 msec MV E velocity: 77.10 cm/s MV A velocity: 99.90 cm/s MV E/A ratio:  0.77 Redell Shallow MD Electronically signed by Redell Shallow MD Signature Date/Time: 07/26/2024/9:48:34 AM    Final      Labs:   Basic Metabolic Panel: Recent Labs  Lab 07/24/24 1640 07/24/24 1641 07/24/24 2337 07/25/24 0319 07/26/24 0749  NA 131* 135  --  134* 136  K 3.7 3.8  --  3.7 4.1  CL 97* 101  --  100 102  CO2 22  --   --  22 23  GLUCOSE 244* 234*  --  310* 170*  BUN 56* 56*  --  49* 34*  CREATININE 1.44* 1.60* 1.36* 1.26* 1.17*  CALCIUM  9.9  --   --  9.4 10.0  MG  --   --  1.8  --   --    GFR Estimated Creatinine Clearance: 35.2 mL/min (A) (by C-G formula based on SCr of 1.17 mg/dL (H)). Liver Function Tests: Recent Labs  Lab 07/24/24 1640  AST 35  ALT 75*  ALKPHOS 120  BILITOT 0.7  PROT 6.8  ALBUMIN 3.5   No results for input(s): LIPASE, AMYLASE in the last 168 hours. No results for input(s): AMMONIA in the last 168 hours. Coagulation profile No results for input(s): INR, PROTIME in the last 168 hours.  CBC: Recent Labs  Lab 07/24/24 1640 07/24/24 1641 07/24/24 2337 07/25/24 0319 07/26/24 0749  WBC 13.2*  --  10.0 9.1 9.9  NEUTROABS 9.5*  --   --   --   --   HGB 13.6  14.6 12.6 12.8 13.3  HCT 42.5 43.0 38.8 38.6 41.1  MCV 97.0  --  93.3 93.0 94.3  PLT 348  --  296 268 269   Cardiac Enzymes: No  results for input(s): CKTOTAL, CKMB, CKMBINDEX, TROPONINI in the last 168 hours. BNP: Invalid input(s): POCBNP CBG: Recent Labs  Lab 07/25/24 0817 07/25/24 1224 07/25/24 1547 07/26/24 0841  GLUCAP 148* 236* 182* 175*   D-Dimer No results for input(s): DDIMER in the last 72 hours. Hgb A1c Recent Labs    07/24/24 2337  HGBA1C 7.4*   Lipid Profile No results for input(s): CHOL, HDL, LDLCALC, TRIG, CHOLHDL, LDLDIRECT in the last 72 hours. Thyroid  function studies Recent Labs    07/24/24 2337  TSH 1.675   Anemia work up No results for input(s): VITAMINB12, FOLATE, FERRITIN, TIBC, IRON, RETICCTPCT in the last 72 hours. Microbiology No results found for this or any previous visit (from the past 240 hours).  Time coordinating discharge: 45 minutes  Signed: Lavaeh Bau  [facility] 07/26/2024, 11:27 AM

## 2024-07-26 NOTE — Plan of Care (Signed)
 Patient calm and cooperative dressing on left arm changed. Patient left with call bell in reach bed in lowest position and side rails up.   Problem: Education: Goal: Knowledge of General Education information will improve Description: Including pain rating scale, medication(s)/side effects and non-pharmacologic comfort measures Outcome: Progressing   Problem: Health Behavior/Discharge Planning: Goal: Ability to manage health-related needs will improve Outcome: Progressing   Problem: Clinical Measurements: Goal: Ability to maintain clinical measurements within normal limits will improve Outcome: Progressing   Problem: Activity: Goal: Risk for activity intolerance will decrease Outcome: Progressing   Problem: Nutrition: Goal: Adequate nutrition will be maintained Outcome: Progressing   Problem: Coping: Goal: Level of anxiety will decrease Outcome: Progressing   Problem: Pain Managment: Goal: General experience of comfort will improve and/or be controlled Outcome: Progressing   Problem: Safety: Goal: Ability to remain free from injury will improve Outcome: Progressing

## 2024-07-26 NOTE — Telephone Encounter (Signed)
 Pharmacy Patient Advocate Encounter  Insurance verification completed.    The patient is insured through [employer]/ [employer]. Patient has Medicare and is not eligible for a copay card, but may be able to apply for patient assistance or Medicare RX Payment Plan (Patient Must reach out to their plan, if eligible for payment plan), if available.    Ran test claim for Tradjenta 5MG  and the current 30 day co-pay is $0.   This test claim was processed through [employer]- copay amounts may vary at other pharmacies due to [employer], or as the patient moves through the different stages of their insurance plan.

## 2024-07-26 NOTE — Progress Notes (Signed)
 Family is at bedside concerned about her bill whether insurance will cover bill due her being listed under observation. Family needs to find out due to insurance will not cover bill if its under observation.

## 2024-07-26 NOTE — Progress Notes (Signed)
 Transition of Care [facility]) - Inpatient Brief Assessment   Patient Details  Name: Erica Cervantes MRN: [id number] Date of Birth: [birth year]  [facility] [place] CM/SW Contact:    Erica JONELLE Joe, RN Phone Number: 07/26/2024, 11:24 AM   Clinical Narrative: CM met with the patient at the bedside - patient plans to return home when stable.  The patient has home cardiac monitor in place per bedside nursing.  The patient states that she lives alone but plans to go out of town with her family and will return home to her home on Monday, 07/31/24.  The patient states that she has a RW at home.  I provided the patient with Medicare choice regarding home health and patient states that she was active with [facility] in the past and preferred to have services restarted with the agency.  I called the patient's daughter and she plans to provide transportation for discharge today but plans to pick up the patient at the main entrance and does not plan to come to the Cervantes room to receive instructions.  HH orders are in place for Erica Cervantes and Erica Cervantes plans to start services on 07/31/24 when patient return from being out of town with family.   Transition of Care Asessment: Insurance and Status: (P) Insurance coverage has been reviewed Patient has primary care physician: (P) Yes Home environment has been reviewed: (P) from home alone Prior level of function:: (P) RW Prior/Current Home Services: (P) No current home services Social Drivers of Health Review: (P) SDOH reviewed interventions complete Readmission risk has been reviewed: (P) Yes Transition of care needs: (P) transition of care needs identified, TOC will continue to follow

## 2024-07-26 NOTE — Inpatient Diabetes Management (Signed)
 Inpatient Diabetes Program Recommendations  AACE/ADA: New Consensus Statement on Inpatient Glycemic Control (2015)  Target Ranges:  Prepandial:   less than 140 mg/dL      Peak postprandial:   less than 180 mg/dL (1-2 hours)      Critically ill patients:  140 - 180 mg/dL   Lab Results  Component Value Date   GLUCAP 175 (H) 07/26/2024   HGBA1C 7.4 (H) 07/24/2024    Review of Glycemic Control  Latest Reference Range & Units 07/25/24 08:17 07/25/24 12:24 07/25/24 15:47 07/26/24 08:41  Glucose-Capillary 70 - 99 mg/dL 851 (H) 763 (H) 817 (H) 175 (H)  (H): Data is abnormally high  Diabetes history: DM2 Outpatient Diabetes medications: Metformin 500 mg QD Current orders for Inpatient glycemic control: Novolog  0-9 units TID  Discharge Diabetes Recommendations:    Might consider at discharge:  Metformin 1000 QAM with breakfast and 500 mg wither supper Tradjenta 5 mg every day (if affordable).  I have asked out [facility] pharmacy for a benefit check. If Braulio is too costly, might consider Amaryl 1 mg with breakfast.  This has a lower risk of hypoglycemia than Glipizide.    Met with patient at bedside.  She is wearing her Freestyle Libre CGM.  Current CBG is 215 mg/dL.  She has used Jardiance in the past but that was discontinued due to vaginal infections.  She has used a GLP-1 in the past but she could not tolerate the side effects.  Was recently taken off Glipizide due to having episodes of hypoglycemia and started in Metformin 500 mg every day.  She has taken up to 1 g bid of Metformin in the past.    Discussed hypoglycemia and hyperglycemia, signs, symptoms and treatments.  Will attach education to exit care.    Thank you, Wyvonna Pinal, MSN, CDCES Diabetes Coordinator Inpatient Diabetes Program [phone] (team pager from 8a-5p)

## 2024-07-26 NOTE — Progress Notes (Signed)
 Nurse tried to go over discharged paper work with daughter Joen over the phone. Nurse wasn't able to go over instructions fully with daughter due to several interruptions and daughter being in a rush to get instructions. Nurse made daughter aware all new medications prescribed was picked up and will be sent with patient. Daughter states she's not able to come into hospital due to a procedure she has had. Made her aware pt will be in discharge lounge. She will just need to call when she has arrived. Discharge nurse went over paperwork with patient.

## 2024-07-26 NOTE — Progress Notes (Signed)
 14 day cardiac monitor for syncopy. Dr Jeffrie to read.

## 2024-08-01 DIAGNOSIS — Z7984 Long term (current) use of oral hypoglycemic drugs: Secondary | ICD-10-CM | POA: Diagnosis not present

## 2024-08-01 DIAGNOSIS — E871 Hypo-osmolality and hyponatremia: Secondary | ICD-10-CM | POA: Diagnosis not present

## 2024-08-01 DIAGNOSIS — I700 Atherosclerosis of aorta: Secondary | ICD-10-CM | POA: Diagnosis not present

## 2024-08-01 DIAGNOSIS — I5022 Chronic systolic (congestive) heart failure: Secondary | ICD-10-CM | POA: Diagnosis not present

## 2024-08-01 DIAGNOSIS — M47816 Spondylosis without myelopathy or radiculopathy, lumbar region: Secondary | ICD-10-CM | POA: Diagnosis not present

## 2024-08-01 DIAGNOSIS — Z7982 Long term (current) use of aspirin: Secondary | ICD-10-CM | POA: Diagnosis not present

## 2024-08-01 DIAGNOSIS — M47812 Spondylosis without myelopathy or radiculopathy, cervical region: Secondary | ICD-10-CM | POA: Diagnosis not present

## 2024-08-01 DIAGNOSIS — Z96641 Presence of right artificial hip joint: Secondary | ICD-10-CM | POA: Diagnosis not present

## 2024-08-01 DIAGNOSIS — E1165 Type 2 diabetes mellitus with hyperglycemia: Secondary | ICD-10-CM | POA: Diagnosis not present

## 2024-08-01 DIAGNOSIS — I2510 Atherosclerotic heart disease of native coronary artery without angina pectoris: Secondary | ICD-10-CM | POA: Diagnosis not present

## 2024-08-01 DIAGNOSIS — F909 Attention-deficit hyperactivity disorder, unspecified type: Secondary | ICD-10-CM | POA: Diagnosis not present

## 2024-08-01 DIAGNOSIS — F32A Depression, unspecified: Secondary | ICD-10-CM | POA: Diagnosis not present

## 2024-08-01 DIAGNOSIS — I083 Combined rheumatic disorders of mitral, aortic and tricuspid valves: Secondary | ICD-10-CM | POA: Diagnosis not present

## 2024-08-01 DIAGNOSIS — L989 Disorder of the skin and subcutaneous tissue, unspecified: Secondary | ICD-10-CM | POA: Diagnosis not present

## 2024-08-01 DIAGNOSIS — E1122 Type 2 diabetes mellitus with diabetic chronic kidney disease: Secondary | ICD-10-CM | POA: Diagnosis not present

## 2024-08-01 DIAGNOSIS — K219 Gastro-esophageal reflux disease without esophagitis: Secondary | ICD-10-CM | POA: Diagnosis not present

## 2024-08-01 DIAGNOSIS — I130 Hypertensive heart and chronic kidney disease with heart failure and stage 1 through stage 4 chronic kidney disease, or unspecified chronic kidney disease: Secondary | ICD-10-CM | POA: Diagnosis not present

## 2024-08-01 DIAGNOSIS — E1142 Type 2 diabetes mellitus with diabetic polyneuropathy: Secondary | ICD-10-CM | POA: Diagnosis not present

## 2024-08-01 DIAGNOSIS — M797 Fibromyalgia: Secondary | ICD-10-CM | POA: Diagnosis not present

## 2024-08-01 DIAGNOSIS — S2242XD Multiple fractures of ribs, left side, subsequent encounter for fracture with routine healing: Secondary | ICD-10-CM | POA: Diagnosis not present

## 2024-08-01 DIAGNOSIS — G8929 Other chronic pain: Secondary | ICD-10-CM | POA: Diagnosis not present

## 2024-08-01 DIAGNOSIS — N179 Acute kidney failure, unspecified: Secondary | ICD-10-CM | POA: Diagnosis not present

## 2024-08-01 DIAGNOSIS — N184 Chronic kidney disease, stage 4 (severe): Secondary | ICD-10-CM | POA: Diagnosis not present

## 2024-08-01 DIAGNOSIS — E785 Hyperlipidemia, unspecified: Secondary | ICD-10-CM | POA: Diagnosis not present

## 2024-08-02 DIAGNOSIS — I5020 Unspecified systolic (congestive) heart failure: Secondary | ICD-10-CM | POA: Diagnosis not present

## 2024-08-02 DIAGNOSIS — F331 Major depressive disorder, recurrent, moderate: Secondary | ICD-10-CM | POA: Diagnosis not present

## 2024-08-02 DIAGNOSIS — G629 Polyneuropathy, unspecified: Secondary | ICD-10-CM | POA: Diagnosis not present

## 2024-08-02 DIAGNOSIS — E1122 Type 2 diabetes mellitus with diabetic chronic kidney disease: Secondary | ICD-10-CM | POA: Diagnosis not present

## 2024-08-02 DIAGNOSIS — F424 Excoriation (skin-picking) disorder: Secondary | ICD-10-CM | POA: Diagnosis not present

## 2024-08-02 DIAGNOSIS — R413 Other amnesia: Secondary | ICD-10-CM | POA: Diagnosis not present

## 2024-08-02 DIAGNOSIS — E782 Mixed hyperlipidemia: Secondary | ICD-10-CM | POA: Diagnosis not present

## 2024-08-02 DIAGNOSIS — I10 Essential (primary) hypertension: Secondary | ICD-10-CM | POA: Diagnosis not present

## 2024-08-02 DIAGNOSIS — D509 Iron deficiency anemia, unspecified: Secondary | ICD-10-CM | POA: Diagnosis not present

## 2024-08-02 DIAGNOSIS — Z96641 Presence of right artificial hip joint: Secondary | ICD-10-CM | POA: Diagnosis not present

## 2024-08-02 DIAGNOSIS — K2100 Gastro-esophageal reflux disease with esophagitis, without bleeding: Secondary | ICD-10-CM | POA: Diagnosis not present

## 2024-08-04 ENCOUNTER — Telehealth (HOSPITAL_COMMUNITY): Payer: Self-pay

## 2024-08-04 NOTE — Telephone Encounter (Signed)
 Called to confirm/remind patient of their appointment at the [facility] on 08/07/24.   Appointment:   [x] Confirmed  [] Left mess   [] No answer/No voice mail  [] VM Full/unable to leave message  [] Phone not in service  Patient reminded to bring all medications and/or complete list.  Confirmed patient has transportation. Gave directions, instructed to utilize valet parking.

## 2024-08-04 NOTE — Progress Notes (Signed)
 ADVANCED HF CLINIC NOTE  PCP: Toribio Jerel MATSU, MD HF Cardiologist: Dr. Cherrie  HPI: Erica Cervantes is a 78 y.o. female with chronic systolic heart failure, DMII, CKD, HTN, cognitive deficits, GERD, chronic pain, and fibromyalgia.   She was admitted to [facility] 1/24 with SOB, fatigue, and chest tightness. Echo showed EF 15% and she was transferred to [facility] for further workup. She was diuresed with IV lasix  and underwent R/LHC showing mild CAD, severe biventricular heart failure with low-output. Started on IV lasix  and milrinone . cMRI showed LVEF 17%, RVEF 23%, moderate MR and otherwise myocardial fibrosis, but no other findings of infiltrative disease.   Echo 5/24 EF 20-25%, RV mildly reduced, mild MR.    Echo 3/25 EF 40-45%, G1DD, low-nl RV, triv MR  Admitted 10/25 with syncope resulting with rib fractures. Found to have AKI and given IVF. Had bradycardia and digoxin  stopped, Entresto  dose reduced to 49/51. Ltd echo showed EF 50-55%, RV normal. 2 week Zio placed.  Today she returns for post hospital HF follow up with her family member. Overall feeling fine. Ribs are sore but she feels better each day on lower doses of her meds. She is not SOB walking on flat ground. Denies palpitations, abnormal bleeding, CP, dizziness, edema, or PND/Orthopnea. Appetite ok. Weight at home 136-141 pounds. Taking all medications, out of Lasix  x 3 days. No further syncope.  Family History: Some family cardiac history but she doesn't recall exactly what. Sister has a fib and her son has something cardiac related condition for which he's on meds.   Past Medical History:  Diagnosis Date   Chronic kidney disease    stage 4   Depression    Diabetes mellitus without complication (HCC)    GERD (gastroesophageal reflux disease)    Hypertension    Neuromuscular disorder (HCC)    peripheral neuropathy   Current Outpatient Medications  Medication Sig Dispense Refill   amphetamine-dextroamphetamine (ADDERALL)  20 MG tablet Take 20-40 mg by mouth See admin instructions. Take 40 mg (2 tablets) by mouth in the morning and 20 mg (1 tablet) at 2 pm     aspirin  EC 81 MG tablet Take 1 tablet (81 mg total) by mouth daily. Swallow whole. 30 tablet 12   atorvastatin  (LIPITOR) 10 MG tablet Take 10 mg by mouth See admin instructions. Take 1 tablet by mouth every Sunday and Wednesday     busPIRone  (BUSPAR ) 30 MG tablet Take 30 mg by mouth 3 (three) times daily.     cholecalciferol (VITAMIN D3) 25 MCG (1000 UNIT) tablet Take 2,000 Units by mouth daily.     cycloSPORINE (RESTASIS) 0.05 % ophthalmic emulsion Place 2 drops into both eyes 2 (two) times daily.     DICYCLOMINE HCL PO Take 50 mg by mouth daily.     donepezil  (ARICEPT ) 5 MG tablet Take 5 mg by mouth at bedtime.     DULoxetine  (CYMBALTA ) 60 MG capsule Take 120 mg by mouth daily at 12 noon.     folic acid  (FOLVITE ) 800 MCG tablet Take 800-2,400 mcg by mouth daily.     furosemide  (LASIX ) 40 MG tablet Take 40 mg by mouth daily.     Guaifenesin (MUCINEX MAXIMUM STRENGTH) 1200 MG TB12 Take 1,200 mg by mouth daily as needed (severe congestion).     lidocaine  4 % Place 1 patch onto the skin daily as needed (pain). 14 patch 0   linagliptin (TRADJENTA) 5 MG TABS tablet Take 1 tablet (5 mg total) by  mouth daily. 30 tablet 0   metformin (FORTAMET) 500 MG (OSM) 24 hr tablet Take 500 mg by mouth daily with breakfast.     methocarbamol  (ROBAXIN ) 500 MG tablet Take 1 tablet (500 mg total) by mouth every 8 (eight) hours as needed for muscle spasms. 30 tablet 0   metoprolol  succinate (TOPROL  XL) 25 MG 24 hr tablet Take 1 tablet (25 mg total) by mouth daily. 60 tablet 5   omeprazole (PRILOSEC) 20 MG capsule Take 20 mg by mouth daily.     ONETOUCH VERIO test strip 1 each daily.     oxyCODONE  (ROXICODONE ) 5 MG immediate release tablet Take 1 tablet (5 mg total) by mouth every 6 (six) hours as needed for severe pain (pain score 7-10). 20 tablet 0   sacubitril -valsartan   (ENTRESTO ) 49-51 MG Take 1 tablet by mouth 2 (two) times daily. 60 tablet 0   spironolactone  (ALDACTONE ) 25 MG tablet Take 1 tablet (25 mg total) by mouth daily. 30 tablet 0   triamcinolone  cream (KENALOG ) 0.1 % Apply 1 application. topically 3 (three) times daily as needed for itching or rash.     No current facility-administered medications for this encounter.   Allergies  Allergen Reactions   Sulfa Antibiotics Shortness Of Breath    Other Reaction(s): Not available   Ace Inhibitors Cough    Pt tolerates lisinopril    Codeine Itching and Swelling    Other Reaction(s): Not available   Ezetimibe     Myalgia  Other Reaction(s): Not available   Penicillins     Unknown reaction     Statins     Body aches  Other Reaction(s): Not available   Tramadol     Numbness  Other Reaction(s): Not available   Neosporin [Neomycin-Bacitracin Zn-Polymyx] Rash   Social History   Socioeconomic History   Marital status: Single    Spouse name: Not on file   Number of children: Not on file   Years of education: Not on file   Highest education level: Not on file  Occupational History   Not on file  Tobacco Use   Smoking status: Never    Passive exposure: Never   Smokeless tobacco: Never  Vaping Use   Vaping status: Never Used  Substance and Sexual Activity   Alcohol  use: No   Drug use: No   Sexual activity: Not on file  Other Topics Concern   Not on file  Social History Narrative   Not on file   Social Drivers of Health   Financial Resource Strain: Not on file  Food Insecurity: No Food Insecurity (07/24/2024)   Hunger Vital Sign    Worried About Running Out of Food in the Last Year: Never true    Ran Out of Food in the Last Year: Never true  Transportation Needs: No Transportation Needs (07/24/2024)   PRAPARE - Administrator, Civil Service (Medical): No    Lack of Transportation (Non-Medical): No  Physical Activity: Not on file  Stress: Not on file  Social  Connections: Socially Isolated (07/24/2024)   Social Connection and Isolation Panel    Frequency of Communication with Friends and Family: Three times a week    Frequency of Social Gatherings with Friends and Family: Once a week    Attends Religious Services: Patient declined    Database Administrator or Organizations: No    Attends Banker Meetings: Never    Marital Status: Divorced  Catering Manager Violence: Not At  Risk (07/24/2024)   Humiliation, Afraid, Rape, and Kick questionnaire    Fear of Current or Ex-Partner: No    Emotionally Abused: No    Physically Abused: No    Sexually Abused: No   Family History  Problem Relation Age of Onset   Cancer Mother    Cancer Father    Heart attack Brother    Polycystic kidney disease Brother    Cancer Other    BP 124/76   Pulse 95   Wt 64.3 kg (141 lb 12.8 oz)   SpO2 97%   BMI 25.12 kg/m   Wt Readings from Last 3 Encounters:  08/07/24 64.3 kg (141 lb 12.8 oz)  07/24/24 67 kg (147 lb 11.3 oz)  05/18/24 67.3 kg (148 lb 6.4 oz)   PHYSICAL EXAM: General:  NAD. No resp difficulty, walked into clinic, frail HEENT: Normal Neck: Supple. No JVD. Cor: Regular rate & rhythm. No rubs, gallops or murmurs. Lungs: Clear Abdomen: Soft, nontender, nondistended.  Extremities: No cyanosis, clubbing, rash, edema Neuro: Alert & oriented x 3, moves all 4 extremities w/o difficulty. Affect pleasant.  ReDs reading: 25 %, normal  ASSESSMENT & PLAN: 1. Chronic Systolic HF  - Echo at [facility] (1/24):  EF 15%, LV mod-sev dilated, LA mildly dilated, RV function normal, mod pulmonary hypertension, mod MR - R/LHC (1/24): mild CAD, biventricular failure and cardiogenic shock - cMRI (1/24): LVEF 17% RV EF 23%. Extracellular volume 36%. + myocardial fibrosis but no other findings of infiltrative disease  - Echo (5/24): EF 20-25%, RV mildly reduced, mild MR - Etiology of CM unclear. Not candidate for advanced therapies with age and  comorbidities - Echo (3/25): EF 40-45%.  - Ltd echo (10/25): EF 50-55%, normal RV - NYHA II/early III confounded by joint pain. Volume low/normal, ReDs 25% - Change Lasix  40 mg to MWF - Continue Entresto  49/51 mg bid. - Continue spironolactone  25 mg daily. - Continue Toprol  XL 25 mg daily. - No SGLT2i with severe GU symptoms. - Off dig with AKi and bradycardia. - Labs today.   2. Syncope - EF recovered - Likely orthostasis vs AKI - Currently wearing 2 week Zio, await results.  3. 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 DM II - Managed by PCP - Off SGLT2i given GU symptoms - Off GLP1 due to nausea and fatigue   5. Chronic anemia - Iron and B12 deficient.  - Receiving Vitamin B12 injections monthly.  - Followed by Heme/Onc  6. HTN - BP stable today - GDMT as above   Follow up in 6 months with Dr. Bensimhon  Erica Cervantes M Kristan Votta, FNP 08/07/2024

## 2024-08-05 DIAGNOSIS — Z23 Encounter for immunization: Secondary | ICD-10-CM | POA: Diagnosis not present

## 2024-08-05 DIAGNOSIS — M797 Fibromyalgia: Secondary | ICD-10-CM | POA: Diagnosis not present

## 2024-08-05 DIAGNOSIS — N1831 Chronic kidney disease, stage 3a: Secondary | ICD-10-CM | POA: Diagnosis not present

## 2024-08-05 DIAGNOSIS — E1122 Type 2 diabetes mellitus with diabetic chronic kidney disease: Secondary | ICD-10-CM | POA: Diagnosis not present

## 2024-08-05 DIAGNOSIS — I5020 Unspecified systolic (congestive) heart failure: Secondary | ICD-10-CM | POA: Diagnosis not present

## 2024-08-05 DIAGNOSIS — F331 Major depressive disorder, recurrent, moderate: Secondary | ICD-10-CM | POA: Diagnosis not present

## 2024-08-05 DIAGNOSIS — I10 Essential (primary) hypertension: Secondary | ICD-10-CM | POA: Diagnosis not present

## 2024-08-05 DIAGNOSIS — G309 Alzheimer's disease, unspecified: Secondary | ICD-10-CM | POA: Diagnosis not present

## 2024-08-05 DIAGNOSIS — F900 Attention-deficit hyperactivity disorder, predominantly inattentive type: Secondary | ICD-10-CM | POA: Diagnosis not present

## 2024-08-05 DIAGNOSIS — K2100 Gastro-esophageal reflux disease with esophagitis, without bleeding: Secondary | ICD-10-CM | POA: Diagnosis not present

## 2024-08-05 DIAGNOSIS — Z6825 Body mass index (BMI) 25.0-25.9, adult: Secondary | ICD-10-CM | POA: Diagnosis not present

## 2024-08-07 ENCOUNTER — Ambulatory Visit (HOSPITAL_COMMUNITY): Payer: Self-pay | Admitting: Family Medicine

## 2024-08-07 ENCOUNTER — Encounter (HOSPITAL_COMMUNITY): Payer: Self-pay

## 2024-08-07 ENCOUNTER — Ambulatory Visit (HOSPITAL_COMMUNITY)
Admission: RE | Admit: 2024-08-07 | Discharge: 2024-08-07 | Disposition: A | Discharge status: Home / Self Care | Source: Ambulatory Visit | Attending: Family Medicine | Admitting: Family Medicine

## 2024-08-07 VITALS — BP 124/76 | HR 95 | Wt 141.8 lb

## 2024-08-07 DIAGNOSIS — R0789 Other chest pain: Secondary | ICD-10-CM | POA: Diagnosis not present

## 2024-08-07 DIAGNOSIS — Z79899 Other long term (current) drug therapy: Secondary | ICD-10-CM | POA: Diagnosis not present

## 2024-08-07 DIAGNOSIS — D649 Anemia, unspecified: Secondary | ICD-10-CM | POA: Diagnosis not present

## 2024-08-07 DIAGNOSIS — N189 Chronic kidney disease, unspecified: Secondary | ICD-10-CM | POA: Insufficient documentation

## 2024-08-07 DIAGNOSIS — I514 Myocarditis, unspecified: Secondary | ICD-10-CM | POA: Diagnosis not present

## 2024-08-07 DIAGNOSIS — I5032 Chronic diastolic (congestive) heart failure: Secondary | ICD-10-CM

## 2024-08-07 DIAGNOSIS — K219 Gastro-esophageal reflux disease without esophagitis: Secondary | ICD-10-CM | POA: Insufficient documentation

## 2024-08-07 DIAGNOSIS — R4189 Other symptoms and signs involving cognitive functions and awareness: Secondary | ICD-10-CM | POA: Insufficient documentation

## 2024-08-07 DIAGNOSIS — I130 Hypertensive heart and chronic kidney disease with heart failure and stage 1 through stage 4 chronic kidney disease, or unspecified chronic kidney disease: Secondary | ICD-10-CM | POA: Diagnosis not present

## 2024-08-07 DIAGNOSIS — R570 Cardiogenic shock: Secondary | ICD-10-CM | POA: Insufficient documentation

## 2024-08-07 DIAGNOSIS — R5383 Other fatigue: Secondary | ICD-10-CM | POA: Insufficient documentation

## 2024-08-07 DIAGNOSIS — I5082 Biventricular heart failure: Secondary | ICD-10-CM | POA: Insufficient documentation

## 2024-08-07 DIAGNOSIS — I272 Pulmonary hypertension, unspecified: Secondary | ICD-10-CM | POA: Insufficient documentation

## 2024-08-07 DIAGNOSIS — E538 Deficiency of other specified B group vitamins: Secondary | ICD-10-CM | POA: Insufficient documentation

## 2024-08-07 DIAGNOSIS — E1142 Type 2 diabetes mellitus with diabetic polyneuropathy: Secondary | ICD-10-CM | POA: Diagnosis not present

## 2024-08-07 DIAGNOSIS — I10 Essential (primary) hypertension: Secondary | ICD-10-CM

## 2024-08-07 DIAGNOSIS — Z8679 Personal history of other diseases of the circulatory system: Secondary | ICD-10-CM | POA: Insufficient documentation

## 2024-08-07 DIAGNOSIS — R110 Nausea: Secondary | ICD-10-CM | POA: Diagnosis not present

## 2024-08-07 DIAGNOSIS — M797 Fibromyalgia: Secondary | ICD-10-CM | POA: Insufficient documentation

## 2024-08-07 DIAGNOSIS — R0602 Shortness of breath: Secondary | ICD-10-CM | POA: Diagnosis not present

## 2024-08-07 DIAGNOSIS — G8929 Other chronic pain: Secondary | ICD-10-CM | POA: Diagnosis not present

## 2024-08-07 DIAGNOSIS — Z7984 Long term (current) use of oral hypoglycemic drugs: Secondary | ICD-10-CM | POA: Insufficient documentation

## 2024-08-07 DIAGNOSIS — N179 Acute kidney failure, unspecified: Secondary | ICD-10-CM | POA: Diagnosis not present

## 2024-08-07 DIAGNOSIS — E611 Iron deficiency: Secondary | ICD-10-CM | POA: Diagnosis not present

## 2024-08-07 DIAGNOSIS — I5022 Chronic systolic (congestive) heart failure: Secondary | ICD-10-CM | POA: Insufficient documentation

## 2024-08-07 DIAGNOSIS — I2510 Atherosclerotic heart disease of native coronary artery without angina pectoris: Secondary | ICD-10-CM | POA: Insufficient documentation

## 2024-08-07 DIAGNOSIS — E1122 Type 2 diabetes mellitus with diabetic chronic kidney disease: Secondary | ICD-10-CM | POA: Diagnosis not present

## 2024-08-07 DIAGNOSIS — N1831 Chronic kidney disease, stage 3a: Secondary | ICD-10-CM

## 2024-08-07 DIAGNOSIS — Z7982 Long term (current) use of aspirin: Secondary | ICD-10-CM | POA: Diagnosis not present

## 2024-08-07 DIAGNOSIS — R55 Syncope and collapse: Secondary | ICD-10-CM

## 2024-08-07 LAB — BRAIN NATRIURETIC PEPTIDE: B Natriuretic Peptide: 39.7 pg/mL (ref 0.0–100.0)

## 2024-08-07 LAB — BASIC METABOLIC PANEL WITH GFR
Anion gap: 12 (ref 5–15)
BUN: 28 mg/dL — ABNORMAL HIGH (ref 8–23)
CO2: 25 mmol/L (ref 22–32)
Calcium: 9.3 mg/dL (ref 8.9–10.3)
Chloride: 103 mmol/L (ref 98–111)
Creatinine, Ser: 1.02 mg/dL — ABNORMAL HIGH (ref 0.44–1.00)
GFR, Estimated: 56 mL/min — ABNORMAL LOW (ref >60)
Glucose, Bld: 154 mg/dL — ABNORMAL HIGH (ref 70–99)
Potassium: 4 mmol/L (ref 3.5–5.1)
Sodium: 140 mmol/L (ref 135–145)

## 2024-08-07 MED ORDER — FUROSEMIDE 40 MG PO TABS: 40.0000 mg | ORAL_TABLET | ORAL | 3 refills | Status: AC

## 2024-08-07 NOTE — Progress Notes (Signed)
 ReDS Vest / Clip - 08/07/24 1500       ReDS Vest / Clip   Station Marker A    Ruler Value 27    ReDS Value Range Low volume    ReDS Actual Value 25

## 2024-08-07 NOTE — Patient Instructions (Addendum)
 Thank you for coming in today  If you had labs drawn today, any labs that are abnormal the clinic will call you No news is good news  Medications: CHANGE Lasix  to 40 mg every Monday Wednesday Friday  Follow up appointments:  Your physician recommends that you schedule a follow-up appointment in:  6 months With Dr. Cherrie April 2026  Please call our office to schedule the follow-up appointment in March 2026.     Do the following things EVERYDAY: Weigh yourself in the morning before breakfast. Write it down and keep it in a log. Take your medicines as prescribed Eat low salt foods--Limit salt (sodium) to 2000 mg per day.  Stay as active as you can everyday Limit all fluids for the day to less than 2 liters   At the [facility], you and your health needs are our priority. As part of our continuing mission to provide you with exceptional heart care, we have created designated Provider Care Teams. These Care Teams include your primary Cardiologist (physician) and Advanced Practice Providers (APPs- Physician Assistants and Nurse Practitioners) who all work together to provide you with the care you need, when you need it.   You may see any of the following providers on your designated Care Team at your next follow up: Dr Toribio Cherrie Dr Ezra Shuck Dr. Ria Gardenia Greig Lenetta, NP Caffie Shed, [place] [facility] [place], [place] Beckey Coe, NP Tinnie Redman, PharmD   Please be sure to bring in all your medications bottles to every appointment.    Thank you for choosing [facility]  If you have any questions or concerns before your next appointment please send us  a message through [place] or call our office at [phone].    TO LEAVE A MESSAGE FOR THE NURSE SELECT OPTION 2, PLEASE LEAVE A MESSAGE INCLUDING: YOUR NAME DATE OF BIRTH CALL BACK NUMBER REASON FOR CALL**this is important as we prioritize  the call backs  YOU WILL RECEIVE A CALL BACK THE SAME DAY AS LONG AS YOU CALL BEFORE 4:00 PM

## 2024-08-11 DIAGNOSIS — E11649 Type 2 diabetes mellitus with hypoglycemia without coma: Secondary | ICD-10-CM | POA: Diagnosis not present

## 2024-08-11 DIAGNOSIS — F331 Major depressive disorder, recurrent, moderate: Secondary | ICD-10-CM | POA: Diagnosis not present

## 2024-08-11 DIAGNOSIS — E782 Mixed hyperlipidemia: Secondary | ICD-10-CM | POA: Diagnosis not present

## 2024-08-16 DIAGNOSIS — D509 Iron deficiency anemia, unspecified: Secondary | ICD-10-CM | POA: Diagnosis not present

## 2024-08-16 DIAGNOSIS — Z96641 Presence of right artificial hip joint: Secondary | ICD-10-CM | POA: Diagnosis not present

## 2024-08-16 DIAGNOSIS — R413 Other amnesia: Secondary | ICD-10-CM | POA: Diagnosis not present

## 2024-08-16 DIAGNOSIS — K2100 Gastro-esophageal reflux disease with esophagitis, without bleeding: Secondary | ICD-10-CM | POA: Diagnosis not present

## 2024-08-16 DIAGNOSIS — E1122 Type 2 diabetes mellitus with diabetic chronic kidney disease: Secondary | ICD-10-CM | POA: Diagnosis not present

## 2024-08-16 DIAGNOSIS — I5020 Unspecified systolic (congestive) heart failure: Secondary | ICD-10-CM | POA: Diagnosis not present

## 2024-08-16 DIAGNOSIS — E782 Mixed hyperlipidemia: Secondary | ICD-10-CM | POA: Diagnosis not present

## 2024-08-16 DIAGNOSIS — F424 Excoriation (skin-picking) disorder: Secondary | ICD-10-CM | POA: Diagnosis not present

## 2024-08-16 DIAGNOSIS — F331 Major depressive disorder, recurrent, moderate: Secondary | ICD-10-CM | POA: Diagnosis not present

## 2024-08-16 DIAGNOSIS — G629 Polyneuropathy, unspecified: Secondary | ICD-10-CM | POA: Diagnosis not present

## 2024-08-16 DIAGNOSIS — I10 Essential (primary) hypertension: Secondary | ICD-10-CM | POA: Diagnosis not present

## 2024-08-18 ENCOUNTER — Inpatient Hospital Stay

## 2024-08-18 ENCOUNTER — Inpatient Hospital Stay: Attending: Hematology

## 2024-08-18 VITALS — BP 126/67 | HR 70 | Temp 96.0°F | Resp 20

## 2024-08-18 DIAGNOSIS — Z8041 Family history of malignant neoplasm of ovary: Secondary | ICD-10-CM | POA: Diagnosis not present

## 2024-08-18 DIAGNOSIS — R779 Abnormality of plasma protein, unspecified: Secondary | ICD-10-CM | POA: Diagnosis not present

## 2024-08-18 DIAGNOSIS — D509 Iron deficiency anemia, unspecified: Secondary | ICD-10-CM | POA: Diagnosis not present

## 2024-08-18 DIAGNOSIS — E538 Deficiency of other specified B group vitamins: Secondary | ICD-10-CM | POA: Diagnosis not present

## 2024-08-18 LAB — CBC WITH DIFFERENTIAL/PLATELET
Abs Immature Granulocytes: 0.06 K/uL (ref 0.00–0.07)
Basophils Absolute: 0.1 K/uL (ref 0.0–0.1)
Basophils Relative: 1 %
Eosinophils Absolute: 0.2 K/uL (ref 0.0–0.5)
Eosinophils Relative: 2 %
HCT: 43.2 % (ref 36.0–46.0)
Hemoglobin: 13.7 g/dL (ref 12.0–15.0)
Immature Granulocytes: 1 %
Lymphocytes Relative: 19 %
Lymphs Abs: 1.9 K/uL (ref 0.7–4.0)
MCH: 30.8 pg (ref 26.0–34.0)
MCHC: 31.7 g/dL (ref 30.0–36.0)
MCV: 97.1 fL (ref 80.0–100.0)
Monocytes Absolute: 0.8 K/uL (ref 0.1–1.0)
Monocytes Relative: 7 %
Neutro Abs: 7.1 K/uL (ref 1.7–7.7)
Neutrophils Relative %: 70 %
Platelets: 349 K/uL (ref 150–400)
RBC: 4.45 MIL/uL (ref 3.87–5.11)
RDW: 13.3 % (ref 11.5–15.5)
WBC: 10.1 K/uL (ref 4.0–10.5)
nRBC: 0 % (ref 0.0–0.2)

## 2024-08-18 LAB — IRON AND TIBC
Iron: 79 ug/dL (ref 28–170)
Saturation Ratios: 30 % (ref 10.4–31.8)
TIBC: 262 ug/dL (ref 250–450)
UIBC: 183 ug/dL

## 2024-08-18 LAB — FOLATE: Folate: >20 ng/mL (ref >5.9)

## 2024-08-18 LAB — FERRITIN: Ferritin: 234 ng/mL (ref 11–307)

## 2024-08-18 LAB — VITAMIN B12: Vitamin B-12: 895 pg/mL (ref 180–914)

## 2024-08-18 MED ORDER — CYANOCOBALAMIN 1000 MCG/ML IJ SOLN
1000.0000 ug | Freq: Once | INTRAMUSCULAR | Status: AC
  Administered 2024-08-18: 1000 ug via INTRAMUSCULAR
  Filled 2024-08-18: qty 1

## 2024-08-18 NOTE — Progress Notes (Signed)
    Cardiology Office Note Date:  08/21/2024  ID:  Erica Cervantes, Erica Cervantes [birth year], MRN [id number] PCP:  Toribio Jerel [name], MD  Cardiologist:   Joelle VEAR Ren Donley, MD  Chief Complaint  Patient presents with   Follow-up     Problems HfimprovedEF EF 15% --> TTE 10/25: EF 50-55% Non-obstructive CAD on LHC 1/24- Lcx 30%, RI 50% DM HA1C 7.4 10/25 Orthostatic hypotension Sinus bradycardia M: ASA81, AN10, FE40, XL25, SE25, SV49-51; no SGLT2 2/2 UTI and significant nausea w/ GLP1  Visits  10/25: admitted for syncope and rib fractures in setting of orthostatic hypotension--> discontinued dig and reduced Entresto  to 49/51 , EM unremarkable 10/25 post hospitalization: Lasix  to 40 mg MWF    History of Present Illness: Erica Cervantes is a 78 y.o. female who presents for follow up.   She has been doing well since she left the hospital.  She still has intermittent rib pain with coughing and pleuritic chest pain but has improved significantly.  Her Lasix  dosing was recently reduced to 40 mg 3 times a week.  She denies any changes in her dyspnea.  She also denies any orthopnea or PND.  She has not had any more dizziness or presyncope since discharge from the hospital.      ROS: Please see the history of present illness. All other systems are reviewed and negative.    PHYSICAL EXAM: VS:  BP 118/68 (BP Location: Left Arm, Patient Position: Sitting, Cuff Size: Normal)   Pulse 82   Ht 5' 2 (1.575 m)   Wt 141 lb (64 kg)   SpO2 98%   BMI 25.79 kg/m  , BMI Body mass index is 25.79 kg/m. GEN: Well nourished, well developed, in no acute distress HEENT: normal Neck: no JVD, carotid bruits, or masses Cardiac: RRR; no murmurs, rubs, or gallops,no edema  Respiratory:  CTAB bilaterally, normal work of breathing GI: soft, nontender, nondistended, + BS Extremities: No LE edema Skin: warm and dry, no rash Neuro:  Strength and sensation are intact   Recent Labs: Reviewed  Studies:  Reviewed  ASSESSMENT AND PLAN: Erica Cervantes is a 78 y.o. female who presents for follow up - She seems to be doing well overall with no dyspnea on the new Lasix  dose and no syncope since discharge from the hospital. - We will check a lipid panel today. - Will see her back in a year.   Signed, Joelle VEAR Ren Donley, MD  08/21/2024 11:30 AM    [facility]

## 2024-08-18 NOTE — Patient Instructions (Signed)
 [facility] [place] - A DEPT OF [facility]  Discharge Instructions: Thank you for choosing [facility] to provide your oncology and hematology care.  If you have a lab appointment with the Cancer Center - please note that after April 8th, 2024, all labs will be drawn in the cancer center.  You do not have to check in or register with the main entrance as you have in the past but will complete your check-in in the cancer center.  Wear comfortable clothing and clothing appropriate for easy access to any Portacath or PICC line.   We strive to give you quality time with your provider. You may need to reschedule your appointment if you arrive late (15 or more minutes).  Arriving late affects you and other patients whose appointments are after yours.  Also, if you miss three or more appointments without notifying the office, you may be dismissed from the clinic at the provider's discretion.      For prescription refill requests, have your pharmacy contact our office and allow 72 hours for refills to be completed.    Today you received the following B12 injection, return as scheduled.   To help prevent nausea and vomiting after your treatment, we encourage you to take your nausea medication as directed.  BELOW ARE SYMPTOMS THAT SHOULD BE REPORTED IMMEDIATELY: *FEVER GREATER THAN 100.4 F (38 C) OR HIGHER *CHILLS OR SWEATING *NAUSEA AND VOMITING THAT IS NOT CONTROLLED WITH YOUR NAUSEA MEDICATION *UNUSUAL SHORTNESS OF BREATH *UNUSUAL BRUISING OR BLEEDING *URINARY PROBLEMS (pain or burning when urinating, or frequent urination) *BOWEL PROBLEMS (unusual diarrhea, constipation, pain near the anus) TENDERNESS IN MOUTH AND THROAT WITH OR WITHOUT PRESENCE OF ULCERS (sore throat, sores in mouth, or a toothache) UNUSUAL RASH, SWELLING OR PAIN  UNUSUAL VAGINAL DISCHARGE OR ITCHING   Items with * indicate a potential emergency and should be followed up as soon as  possible or go to the Emergency Department if any problems should occur.  Please show the CHEMOTHERAPY ALERT CARD or IMMUNOTHERAPY ALERT CARD at check-in to the Emergency Department and triage nurse.  Should you have questions after your visit or need to cancel or reschedule your appointment, please contact [facility] [place] - A DEPT OF [facility]igh[facility] [phone]  and follow the prompts.  Office hours are 8:00 a.m. to 4:30 p.m. Monday - Friday. Please note that voicemails left after 4:00 p.m. may not be returned until the following business day.  We are closed weekends and major holidays. You have access to a nurse at all times for urgent questions. Please call the main number to the clinic [phone] and follow the prompts.  For any non-urgent questions, you may also contact your provider using MyChart. We now offer e-Visits for anyone 69 and older to request care online for non-urgent symptoms. For details visit [email or web address].   Also download the MyChart app! Go to the app store, search "MyChart", open the app, select [place], and log in with your MyChart username and password.

## 2024-08-18 NOTE — Progress Notes (Signed)
 Patient tolerated injection with no complaints voiced. Site clean and dry with no bruising or swelling noted at site. See MAR for details. Band aid applied.  Patient stable during and after injection. VSS with discharge and left in satisfactory condition with no s/s of distress noted.

## 2024-08-21 ENCOUNTER — Ambulatory Visit

## 2024-08-21 VITALS — BP 118/68 | HR 82 | Ht 62.0 in | Wt 141.0 lb

## 2024-08-21 DIAGNOSIS — I5032 Chronic diastolic (congestive) heart failure: Secondary | ICD-10-CM

## 2024-08-21 DIAGNOSIS — R55 Syncope and collapse: Secondary | ICD-10-CM | POA: Diagnosis not present

## 2024-08-21 DIAGNOSIS — E1169 Type 2 diabetes mellitus with other specified complication: Secondary | ICD-10-CM

## 2024-08-21 DIAGNOSIS — I10 Essential (primary) hypertension: Secondary | ICD-10-CM | POA: Diagnosis not present

## 2024-08-21 DIAGNOSIS — I2510 Atherosclerotic heart disease of native coronary artery without angina pectoris: Secondary | ICD-10-CM

## 2024-08-21 DIAGNOSIS — Z79899 Other long term (current) drug therapy: Secondary | ICD-10-CM | POA: Diagnosis not present

## 2024-08-21 NOTE — Patient Instructions (Signed)
 Medication Instructions:  Your physician recommends that you continue on your current medications as directed. Please refer to the Current Medication list given to you today.  *If you need a refill on your cardiac medications before your next appointment, please call your pharmacy*  Lab Work: TODAY: Lipid If you have labs (blood work) drawn today and your tests are completely normal, you will receive your results only by: MyChart Message (if you have MyChart) OR A paper copy in the mail If you have any lab test that is abnormal or we need to change your treatment, we will call you to review the results.  Testing/Procedures: NONE  Follow-Up: At [facility], you and your health needs are our priority.  As part of our continuing mission to provide you with exceptional heart care, our providers are all part of one team.  This team includes your primary Cardiologist (physician) and Advanced Practice Providers or APPs (Physician Assistants and Nurse Practitioners) who all work together to provide you with the care you need, when you need it.  Your next appointment:   1 year(s)  Provider:   Dr Ren

## 2024-08-22 DIAGNOSIS — R55 Syncope and collapse: Secondary | ICD-10-CM | POA: Diagnosis not present

## 2024-08-22 LAB — LIPID PANEL
Chol/HDL Ratio: 3.2 ratio (ref 0.0–4.4)
Cholesterol, Total: 190 mg/dL (ref 100–199)
HDL: 59 mg/dL (ref >39)
LDL Chol Calc (NIH): 96 mg/dL (ref 0–99)
Triglycerides: 204 mg/dL — ABNORMAL HIGH (ref 0–149)
VLDL Cholesterol Cal: 35 mg/dL (ref 5–40)

## 2024-08-23 ENCOUNTER — Ambulatory Visit: Payer: Self-pay

## 2024-08-24 ENCOUNTER — Telehealth: Payer: Self-pay | Admitting: Pharmacy Technician

## 2024-08-24 ENCOUNTER — Other Ambulatory Visit (HOSPITAL_COMMUNITY): Payer: Self-pay

## 2024-08-24 ENCOUNTER — Other Ambulatory Visit: Payer: Self-pay

## 2024-08-24 ENCOUNTER — Encounter: Payer: Self-pay | Admitting: Oncology

## 2024-08-24 MED ORDER — BEMPEDOIC ACID 180 MG PO TABS
1.0000 | ORAL_TABLET | Freq: Every day | ORAL | 3 refills | Status: AC
  Filled 2024-08-24: qty 90, 90d supply, fill #0

## 2024-08-24 NOTE — Telephone Encounter (Signed)
 Pharmacy Patient Advocate Encounter   Received notification from PHARMACY that prior authorization for NEXLETOL is required/requested.   Insurance verification completed.   The patient is insured through [facility] ADVANTAGE/RX ADVANCE.   Per test claim: PA required; PA submitted to above mentioned insurance via Latent Key/confirmation #/EOC [id number] Status is pending

## 2024-08-24 NOTE — Telephone Encounter (Signed)
 Pharmacy Patient Advocate Encounter  Received notification from [employer] that Prior Authorization for NEXLETOL has been APPROVED from 08/24/24 to 02/20/25   PA #/Case ID/Reference #: [id number]

## 2024-08-25 ENCOUNTER — Inpatient Hospital Stay: Admitting: Oncology

## 2024-08-25 VITALS — BP 113/77 | HR 80 | Temp 97.5°F | Resp 18 | Wt 138.0 lb

## 2024-08-25 DIAGNOSIS — D509 Iron deficiency anemia, unspecified: Secondary | ICD-10-CM

## 2024-08-25 DIAGNOSIS — E538 Deficiency of other specified B group vitamins: Secondary | ICD-10-CM

## 2024-08-25 NOTE — Progress Notes (Signed)
 [facility] [address] [place] [address]   Clinic Day:  08/25/2024  Referring physician: Toribio Jerel MATSU, MD  Patient Care Team: Toribio Jerel MATSU, MD as PCP - General (Family Medicine) Jeffrie Oneil BROCKS, MD as PCP - Cardiology (Cardiology)   ASSESSMENT & PLAN:   Assessment:  1.  Severe microcytic anemia from iron deficiency: - CBC (11/06/2022): Hb-10.1, MCV-73.3 - 10/30/2022: Ferritin-12, percent saturation 12 - Feraheme x 2 (3/19 and 3/26) - Erica Cervantes is not on oral iron therapy.  No prior history of blood transfusion.  Denies any bleeding per rectum or melena. - Last colonoscopy at [facility] long time ago.  Erica Cervantes thinks Erica Cervantes had at least 5 colonoscopies.  2.  Social/family history: - Lives with a friend at home.  Erica Cervantes is independent of ADLs and IADLs.  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  Non-smoker. - Son has anemia.  Mother had ovarian cancer.  Father had prostate cancer.  Sister had melanoma.  Plan:  1.  Microcytic anemia from iron deficiency: -Received 2 doses of IV Feraheme on 12/29/2022 and 01/05/2023. -Received 1 additional dose of IV Feraheme on 05/18/2024. -Denies any melena, hematochezia, hemoptysis or nosebleeds.  Fecal occult x 3 was negative. -Received monthly B12 injections most recently given on 08/18/2024. -Lab work from 08/18/2024 showed hemoglobin 13.7, ferritin 234, iron panel with iron saturation 30%.  B12 895 and MMA is pending. -No additional iron needed at this time.   --Continue monthly B12 shots. -Return to clinic in 4 months with labs and see NP.  2.  Hypokalemia: -Resolved.   3.  Elevated kappa lambda free light chain: -Most recent protein electrophoresis was WNL.  No evidence of M spike. -We will periodically continue to monitor.   PLAN SUMMARY: >> Continue monthly B12. >> Return to clinic monthly for B12 injections and in 6 months for lab work and see MD/NP.    I spent 20 minutes  dedicated to the care of this patient (face-to-face and non-face-to-face) on the date of the encounter to include what is described in the assessment and plan.    Orders Placed This Encounter  Procedures   Vitamin B12    Standing Status:   Future    Expected Date:   12/02/2024    Expiration Date:   08/25/2025   Iron and TIBC ([facility] ONLY)    Standing Status:   Future    Expected Date:   12/02/2024    Expiration Date:   08/25/2025   Folate    Standing Status:   Future    Expected Date:   12/02/2024    Expiration Date:   08/25/2025   Ferritin    Standing Status:   Future    Expected Date:   12/02/2024    Expiration Date:   08/25/2025   Methylmalonic acid, serum    Standing Status:   Future    Expected Date:   12/02/2024    Expiration Date:   08/25/2025   CBC with Differential    Standing Status:   Future    Expected Date:   12/02/2024    Expiration Date:   08/25/2025   Ambulatory referral to Gastroenterology    Referral Priority:   Routine    Referral Type:   Consultation    Referral Reason:   Specialty Services Required    Number of Visits Requested:   1   Delon FORBES Hope, NP  11/14/202512:54 PM  CHIEF COMPLAINT/PURPOSE OF CONSULT:   Diagnosis: Microcytic anemia from iron deficiency  Current Therapy: Feraheme   HISTORY OF PRESENT ILLNESS:   Erica Cervantes is a 78 y.o. female presenting to clinic today for follow-up for anemia.   Erica Cervantes is currently receiving monthly B12 shots.  Last was given on 08/18/2024.  Erica Cervantes was recently hospitalized for a syncopal episode.  Reports one of her heart medications needed to be adjusted-digoxin  was discontinued and Entresto  was reduced.  Appetite and energy levels are 80%.  Erica Cervantes denies any falls or syncope since.  Erica Cervantes continues to have right sided rib pain secondary to fracture.    Erica Cervantes was recently started on Mounjaro because her diabetes is controlled.  Reports Erica Cervantes was having severe highs and lows of her blood sugars so Erica Cervantes was  taking off glipizide and put on Mounjaro.     Erica Cervantes already feels significantly better.  Erica Cervantes was also recently placed on gabapentin  for low back pain.  Erica Cervantes previously had received injections but unfortunately it had caused her blood sugars to be elevated.    Reports increase in her energy levels following iron infusion.  Is wondering if Erica Cervantes still needs additional iron.  Any bright red blood per rectum, melena or hematochezia. Erica Cervantes denies any ice pica.     PAST MEDICAL HISTORY:   Past Medical History: Past Medical History:  Diagnosis Date   Chronic kidney disease    stage 4   Depression    Diabetes mellitus without complication (HCC)    GERD (gastroesophageal reflux disease)    Hypertension    Neuromuscular disorder (HCC)    peripheral neuropathy    Surgical History: Past Surgical History:  Procedure Laterality Date   ABDOMINAL HYSTERECTOMY  2   CHOLECYSTECTOMY     age 65   DILATION AND CURETTAGE OF UTERUS     x 4  1980   RIGHT/LEFT HEART CATH AND CORONARY ANGIOGRAPHY N/A 10/30/2022   Procedure: RIGHT/LEFT HEART CATH AND CORONARY ANGIOGRAPHY;  Surgeon: Cherrie Toribio SAUNDERS, MD;  Location: [facility];  Service: Cardiovascular;  Laterality: N/A;   TONSILLECTOMY     age 37   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   Procedure: TOTAL HIP ARTHROPLASTY ANTERIOR APPROACH;  Surgeon: Yvone Rush, MD;  Location: [facility];  Service: Orthopedics;  Laterality: Right;    Social History: Social History   Socioeconomic History   Marital status: Single    Spouse name: Not on file   Number of children: Not on file   Years of education: Not on file   Highest education level: Not on file  Occupational History   Not on file  Tobacco Use   Smoking status: Never    Passive exposure: Never   Smokeless tobacco: Never  Vaping Use   Vaping status: Never Used  Substance and Sexual Activity   Alcohol  use: No   Drug use: No   Sexual activity: Not on file  Other Topics Concern   Not on  file  Social History Narrative   Not on file   Social Drivers of Health   Financial Resource Strain: Not on file  Food Insecurity: No Food Insecurity (07/24/2024)   Hunger Vital Sign    Worried About Running Out of Food in the Last Year: Never true    Ran Out of Food in the Last Year: Never true  Transportation Needs: No Transportation Needs (07/24/2024)   PRAPARE - Administrator, Civil Service (Medical): No  Lack of Transportation (Non-Medical): No  Physical Activity: Not on file  Stress: Not on file  Social Connections: Socially Isolated (07/24/2024)   Social Connection and Isolation Panel    Frequency of Communication with Friends and Family: Three times a week    Frequency of Social Gatherings with Friends and Family: Once a week    Attends Religious Services: Patient declined    Database Administrator or Organizations: No    Attends Banker Meetings: Never    Marital Status: Divorced  Catering Manager Violence: Not At Risk (07/24/2024)   Humiliation, Afraid, Rape, and Kick questionnaire    Fear of Current or Ex-Partner: No    Emotionally Abused: No    Physically Abused: No    Sexually Abused: No    Family History: Family History  Problem Relation Age of Onset   Cancer Mother    Cancer Father    Heart attack Brother    Polycystic kidney disease Brother    Cancer Other     Current Medications:  Current Outpatient Medications:    amphetamine-dextroamphetamine (ADDERALL) 20 MG tablet, Take 20-40 mg by mouth See admin instructions. Take 40 mg (2 tablets) by mouth in the morning and 20 mg (1 tablet) at 2 pm, Disp: , Rfl:    aspirin  EC 81 MG tablet, Take 1 tablet (81 mg total) by mouth daily. Swallow whole., Disp: 30 tablet, Rfl: 12   atorvastatin  (LIPITOR) 10 MG tablet, Take 10 mg by mouth See admin instructions. Take 1 tablet by mouth every Sunday and Wednesday, Disp: , Rfl:    Bempedoic Acid 180 MG TABS, Take 1 tablet (180 mg total) by  mouth daily., Disp: 90 tablet, Rfl: 3   busPIRone  (BUSPAR ) 30 MG tablet, Take 30 mg by mouth 3 (three) times daily., Disp: , Rfl:    cholecalciferol (VITAMIN D3) 25 MCG (1000 UNIT) tablet, Take 2,000 Units by mouth daily., Disp: , Rfl:    cycloSPORINE (RESTASIS) 0.05 % ophthalmic emulsion, Place 2 drops into both eyes 2 (two) times daily., Disp: , Rfl:    DICYCLOMINE HCL PO, Take 50 mg by mouth daily., Disp: , Rfl:    donepezil  (ARICEPT ) 5 MG tablet, Take 5 mg by mouth at bedtime., Disp: , Rfl:    DULoxetine  (CYMBALTA ) 60 MG capsule, Take 120 mg by mouth daily at 12 noon., Disp: , Rfl:    folic acid  (FOLVITE ) 800 MCG tablet, Take 800-2,400 mcg by mouth daily., Disp: , Rfl:    furosemide  (LASIX ) 40 MG tablet, Take 1 tablet (40 mg total) by mouth every Monday, Wednesday, and Friday., Disp: 45 tablet, Rfl: 3   Guaifenesin (MUCINEX MAXIMUM STRENGTH) 1200 MG TB12, Take 1,200 mg by mouth daily as needed (severe congestion)., Disp: , Rfl:    lidocaine  4 %, Place 1 patch onto the skin daily as needed (pain)., Disp: 14 patch, Rfl: 0   linagliptin (TRADJENTA) 5 MG TABS tablet, Take 1 tablet (5 mg total) by mouth daily., Disp: 30 tablet, Rfl: 0   metformin (FORTAMET) 500 MG (OSM) 24 hr tablet, Take 500 mg by mouth daily with breakfast., Disp: , Rfl:    methocarbamol  (ROBAXIN ) 500 MG tablet, Take 1 tablet (500 mg total) by mouth every 8 (eight) hours as needed for muscle spasms., Disp: 30 tablet, Rfl: 0   metoprolol  succinate (TOPROL  XL) 25 MG 24 hr tablet, Take 1 tablet (25 mg total) by mouth daily., Disp: 60 tablet, Rfl: 5   omeprazole (PRILOSEC) 20 MG capsule,  Take 20 mg by mouth daily., Disp: , Rfl:    ONETOUCH VERIO test strip, 1 each daily., Disp: , Rfl:    oxyCODONE  (ROXICODONE ) 5 MG immediate release tablet, Take 1 tablet (5 mg total) by mouth every 6 (six) hours as needed for severe pain (pain score 7-10)., Disp: 20 tablet, Rfl: 0   sacubitril -valsartan  (ENTRESTO ) 49-51 MG, Take 1 tablet by mouth 2  (two) times daily., Disp: 60 tablet, Rfl: 0   spironolactone  (ALDACTONE ) 25 MG tablet, Take 1 tablet (25 mg total) by mouth daily., Disp: 30 tablet, Rfl: 0   triamcinolone  cream (KENALOG ) 0.1 %, Apply 1 application. topically 3 (three) times daily as needed for itching or rash., Disp: , Rfl:    Allergies: Allergies  Allergen Reactions   Sulfa Antibiotics Shortness Of Breath    Other Reaction(s): Not available   Ace Inhibitors Cough    Pt tolerates lisinopril    Codeine Itching and Swelling    Other Reaction(s): Not available   Ezetimibe     Myalgia  Other Reaction(s): Not available   Penicillins     Unknown reaction     Statins     Body aches  Other Reaction(s): Not available   Tramadol     Numbness  Other Reaction(s): Not available   Neosporin [Neomycin-Bacitracin Zn-Polymyx] Rash    REVIEW OF SYSTEMS:   Review of Systems  Constitutional:  Positive for fatigue.  Respiratory:  Positive for shortness of breath.   Musculoskeletal:  Positive for back pain and neck pain.  Neurological:  Positive for dizziness and headaches.     VITALS:   Blood pressure 113/77, pulse 80, temperature (!) 97.5 F (36.4 C), temperature source Oral, resp. rate 18, weight 138 lb (62.6 kg), SpO2 100%.  Wt Readings from Last 3 Encounters:  08/25/24 138 lb (62.6 kg)  08/21/24 141 lb (64 kg)  08/07/24 141 lb 12.8 oz (64.3 kg)    Body mass index is 25.24 kg/m.   PHYSICAL EXAM:   Physical Exam Constitutional:      Appearance: Normal appearance.  Cardiovascular:     Rate and Rhythm: Normal rate and regular rhythm.  Pulmonary:     Effort: Pulmonary effort is normal.     Breath sounds: Normal breath sounds.  Abdominal:     General: Bowel sounds are normal.     Palpations: Abdomen is soft.  Musculoskeletal:        General: No swelling. Normal range of motion.  Neurological:     Mental Status: Erica Cervantes is alert and oriented to person, place, and time. Mental status is at baseline.      LABS:      Latest Ref Rng & Units 08/18/2024   10:57 AM 07/26/2024    7:49 AM 07/25/2024    3:19 AM  CBC  WBC 4.0 - 10.5 K/uL 10.1  9.9  9.1   Hemoglobin 12.0 - 15.0 g/dL 86.2  86.6  87.1   Hematocrit 36.0 - 46.0 % 43.2  41.1  38.6   Platelets 150 - 400 K/uL 349  269  268       Latest Ref Rng & Units 08/07/2024    2:10 PM 07/26/2024    7:49 AM 07/25/2024    3:19 AM  CMP  Glucose 70 - 99 mg/dL 845  829  689   BUN 8 - 23 mg/dL 28  34  49   Creatinine 0.44 - 1.00 mg/dL 8.97  8.82  8.73   Sodium 135 -  145 mmol/L 140  136  134   Potassium 3.5 - 5.1 mmol/L 4.0  4.1  3.7   Chloride 98 - 111 mmol/L 103  102  100   CO2 22 - 32 mmol/L 25  23  22    Calcium  8.9 - 10.3 mg/dL 9.3  89.9  9.4      No results found for: CEA1, CEA / No results found for: CEA1, CEA No results found for: PSA1 No results found for: CAN199 No results found for: RJW874  Lab Results  Component Value Date   TOTALPROTELP 7.1 05/01/2024   ALBUMINELP 3.8 12/25/2022   A1GS 0.2 12/25/2022   A2GS 0.8 12/25/2022   BETS 1.1 12/25/2022   GAMS 1.1 12/25/2022   MSPIKE Not Observed 12/25/2022   SPEI Comment 12/25/2022   Lab Results  Component Value Date   TIBC 262 08/18/2024   TIBC 322 05/01/2024   TIBC 319 01/31/2024   FERRITIN 234 08/18/2024   FERRITIN 84 05/01/2024   FERRITIN 125 01/31/2024   IRONPCTSAT 30 08/18/2024   IRONPCTSAT 25 05/01/2024   IRONPCTSAT 33 (H) 01/31/2024   Lab Results  Component Value Date   LDH 134 12/25/2022     STUDIES:   LONG TERM MONITOR-LIVE TELEMETRY (3-14 DAYS) Result Date: 08/22/2024 - Patch Wear Time:  13 days and 23 hours (2025-10-15T10:37:20-0400 to 2025-10-29T10:03:32-0400) - No evidence of brady- or tachy-arrhythmias. - Rhythm is predominantly normal sinus rhythm.

## 2024-08-26 LAB — METHYLMALONIC ACID, SERUM: Methylmalonic Acid, Quantitative: 200 nmol/L (ref 0–378)

## 2024-08-29 DIAGNOSIS — K2100 Gastro-esophageal reflux disease with esophagitis, without bleeding: Secondary | ICD-10-CM | POA: Diagnosis not present

## 2024-08-29 DIAGNOSIS — F424 Excoriation (skin-picking) disorder: Secondary | ICD-10-CM | POA: Diagnosis not present

## 2024-08-29 DIAGNOSIS — F331 Major depressive disorder, recurrent, moderate: Secondary | ICD-10-CM | POA: Diagnosis not present

## 2024-08-29 DIAGNOSIS — D509 Iron deficiency anemia, unspecified: Secondary | ICD-10-CM | POA: Diagnosis not present

## 2024-08-29 DIAGNOSIS — R413 Other amnesia: Secondary | ICD-10-CM | POA: Diagnosis not present

## 2024-08-29 DIAGNOSIS — E782 Mixed hyperlipidemia: Secondary | ICD-10-CM | POA: Diagnosis not present

## 2024-08-29 DIAGNOSIS — I10 Essential (primary) hypertension: Secondary | ICD-10-CM | POA: Diagnosis not present

## 2024-08-29 DIAGNOSIS — I5020 Unspecified systolic (congestive) heart failure: Secondary | ICD-10-CM | POA: Diagnosis not present

## 2024-08-29 DIAGNOSIS — Z96641 Presence of right artificial hip joint: Secondary | ICD-10-CM | POA: Diagnosis not present

## 2024-08-29 DIAGNOSIS — G629 Polyneuropathy, unspecified: Secondary | ICD-10-CM | POA: Diagnosis not present

## 2024-08-29 DIAGNOSIS — E1122 Type 2 diabetes mellitus with diabetic chronic kidney disease: Secondary | ICD-10-CM | POA: Diagnosis not present

## 2024-08-31 ENCOUNTER — Telehealth: Payer: Self-pay | Admitting: Dietician

## 2024-08-31 NOTE — Telephone Encounter (Signed)
 Returned patient call.  She needs directions to our office for her appointment in 2 weeks.  Directions provided.  Reviewed date and time of appointment.  Leita Constable, RD, LDN, CDCES, DipACLM

## 2024-09-04 DIAGNOSIS — N1831 Chronic kidney disease, stage 3a: Secondary | ICD-10-CM | POA: Diagnosis not present

## 2024-09-04 DIAGNOSIS — I10 Essential (primary) hypertension: Secondary | ICD-10-CM | POA: Diagnosis not present

## 2024-09-04 DIAGNOSIS — E7849 Other hyperlipidemia: Secondary | ICD-10-CM | POA: Diagnosis not present

## 2024-09-04 DIAGNOSIS — Z1329 Encounter for screening for other suspected endocrine disorder: Secondary | ICD-10-CM | POA: Diagnosis not present

## 2024-09-04 DIAGNOSIS — E559 Vitamin D deficiency, unspecified: Secondary | ICD-10-CM | POA: Diagnosis not present

## 2024-09-04 DIAGNOSIS — E1165 Type 2 diabetes mellitus with hyperglycemia: Secondary | ICD-10-CM | POA: Diagnosis not present

## 2024-09-05 ENCOUNTER — Other Ambulatory Visit (HOSPITAL_COMMUNITY): Payer: Self-pay

## 2024-09-05 ENCOUNTER — Encounter (INDEPENDENT_AMBULATORY_CARE_PROVIDER_SITE_OTHER): Payer: Self-pay | Admitting: *Deleted

## 2024-09-06 ENCOUNTER — Encounter (INDEPENDENT_AMBULATORY_CARE_PROVIDER_SITE_OTHER): Payer: Self-pay | Admitting: *Deleted

## 2024-09-10 DIAGNOSIS — E782 Mixed hyperlipidemia: Secondary | ICD-10-CM | POA: Diagnosis not present

## 2024-09-10 DIAGNOSIS — E11649 Type 2 diabetes mellitus with hypoglycemia without coma: Secondary | ICD-10-CM | POA: Diagnosis not present

## 2024-09-10 DIAGNOSIS — I5020 Unspecified systolic (congestive) heart failure: Secondary | ICD-10-CM | POA: Diagnosis not present

## 2024-09-11 DIAGNOSIS — Z0001 Encounter for general adult medical examination with abnormal findings: Secondary | ICD-10-CM | POA: Diagnosis not present

## 2024-09-11 DIAGNOSIS — Z1331 Encounter for screening for depression: Secondary | ICD-10-CM | POA: Diagnosis not present

## 2024-09-11 DIAGNOSIS — Z1389 Encounter for screening for other disorder: Secondary | ICD-10-CM | POA: Diagnosis not present

## 2024-09-11 DIAGNOSIS — I10 Essential (primary) hypertension: Secondary | ICD-10-CM | POA: Diagnosis not present

## 2024-09-11 DIAGNOSIS — K2100 Gastro-esophageal reflux disease with esophagitis, without bleeding: Secondary | ICD-10-CM | POA: Diagnosis not present

## 2024-09-11 DIAGNOSIS — I5020 Unspecified systolic (congestive) heart failure: Secondary | ICD-10-CM | POA: Diagnosis not present

## 2024-09-11 DIAGNOSIS — G309 Alzheimer's disease, unspecified: Secondary | ICD-10-CM | POA: Diagnosis not present

## 2024-09-11 DIAGNOSIS — F900 Attention-deficit hyperactivity disorder, predominantly inattentive type: Secondary | ICD-10-CM | POA: Diagnosis not present

## 2024-09-11 DIAGNOSIS — M797 Fibromyalgia: Secondary | ICD-10-CM | POA: Diagnosis not present

## 2024-09-11 DIAGNOSIS — E1122 Type 2 diabetes mellitus with diabetic chronic kidney disease: Secondary | ICD-10-CM | POA: Diagnosis not present

## 2024-09-11 DIAGNOSIS — N1831 Chronic kidney disease, stage 3a: Secondary | ICD-10-CM | POA: Diagnosis not present

## 2024-09-11 DIAGNOSIS — Z6826 Body mass index (BMI) 26.0-26.9, adult: Secondary | ICD-10-CM | POA: Diagnosis not present

## 2024-09-14 ENCOUNTER — Encounter: Payer: Self-pay | Admitting: "Endocrinology

## 2024-09-14 ENCOUNTER — Ambulatory Visit: Admitting: "Endocrinology

## 2024-09-14 VITALS — BP 122/80 | HR 85 | Ht 62.0 in | Wt 144.0 lb

## 2024-09-14 DIAGNOSIS — E782 Mixed hyperlipidemia: Secondary | ICD-10-CM

## 2024-09-14 DIAGNOSIS — E11649 Type 2 diabetes mellitus with hypoglycemia without coma: Secondary | ICD-10-CM

## 2024-09-14 DIAGNOSIS — E559 Vitamin D deficiency, unspecified: Secondary | ICD-10-CM | POA: Diagnosis not present

## 2024-09-14 DIAGNOSIS — Z7984 Long term (current) use of oral hypoglycemic drugs: Secondary | ICD-10-CM

## 2024-09-14 MED ORDER — VITAMIN D (ERGOCALCIFEROL) 1.25 MG (50000 UNIT) PO CAPS: 50000.0000 [IU] | ORAL_CAPSULE | ORAL | 0 refills | Status: AC

## 2024-09-14 MED ORDER — ATORVASTATIN CALCIUM 10 MG PO TABS: 10.0000 mg | ORAL_TABLET | ORAL | 1 refills | Status: AC

## 2024-09-14 NOTE — Patient Instructions (Addendum)
 Stop Tradjenta /Linagliptin  5 mg every day Continue Metformin XR 500 mg once or twice a day as tolerated Ordered diabetes education - write food notes for high and low blood sugars    ________________    Goals of DM therapy:  Morning Fasting blood sugar: 80-140  Blood sugar before meals: 80-140 Bed time blood sugar: 100-150  A1C <7%, limited only by hypoglycemia  1.Diabetes medications and their side effects discussed, including hypoglycemia    2. Check blood glucose:  a) Always check blood sugars before driving. Please see below (under hypoglycemia) on how to manage b) Check a minimum of 3 times/day or more as needed when having symptoms of hypoglycemia.   c) Try to check blood glucose before sleeping/in the middle of the night to ensure that it is remaining stable and not dropping less than 100 d) Check blood glucose more often if sick  3. Diet: a) 3 meals per day schedule b: Restrict carbs to 60-70 grams (4 servings) per meal c) Colorful vegetables - 3 servings a day, and low sugar fruit 2 servings/day Plate control method: 1/4 plate protein, 1/4 starch, 1/2 green, yellow, or red vegetables d) Avoid carbohydrate snacks unless hypoglycemic episode, or increased physical activity  4. Regular exercise as tolerated, preferably 3 or more hours a week  5. Hypoglycemia: a)  Do not drive or operate machinery without first testing blood glucose to assure it is over 90 mg%, or if dizzy, lightheaded, not feeling normal, etc, or  if foot or leg is numb or weak. b)  If blood glucose less than 70, take four 5gm Glucose tabs or 15-30 gm Glucose gel.  Repeat every 15 min as needed until blood sugar is >100 mg/dl. If hypoglycemia persists then call 911.   6. Sick day management: a) Check blood glucose more often b) Continue usual therapy if blood sugars are elevated.   7. Contact the doctor immediately if blood glucose is frequently <60 mg/dl, or an episode of severe hypoglycemia occurs  (where someone had to give you glucose/  glucagon or if you passed out from a low blood glucose), or if blood glucose is persistently >350 mg/dl, for further management  8. A change in level of physical activity or exercise and a change in diet may also affect your blood sugar. Check blood sugars more often and call if needed.  Instructions: 1. Bring glucose meter, blood glucose records on every visit for review 2. Continue to follow up with primary care physician and other providers for medical care 3. Yearly eye  and foot exam 4. Please get blood work done prior to the next appointment

## 2024-09-14 NOTE — Progress Notes (Signed)
 Outpatient Endocrinology Note Erica Birmingham, Cervantes  09/14/24   Erica Cervantes [birth year] [id number]  Referring Provider: Lee, Jordan, NP Primary Care Provider: Toribio Jerel Erica Cervantes Reason for consultation: Subjective   Assessment & Plan  Diagnoses and all orders for this visit:  Uncontrolled type 2 diabetes mellitus with hypoglycemia without coma (HCC) -     Microalbumin / creatinine urine ratio -     Ambulatory referral to diabetic education  Long term (current) use of oral hypoglycemic drugs  Mixed hypercholesterolemia and hypertriglyceridemia  Vitamin D deficiency  Other orders -     atorvastatin  (LIPITOR) 10 MG tablet; Take 1 tablet (10 mg total) by mouth every other day. Take 1 tablet by mouth every Sunday and Wednesday -     Vitamin D, Ergocalciferol, (DRISDOL) 1.25 MG (50000 UNIT) CAPS capsule; Take 1 capsule (50,000 Units total) by mouth every 7 (seven) days.   08/2024 Vit D low at 17 Pt is taking 1000 units every day Ordered Vit D 50,000 units weekly for 12 week followed by OTC Vit D 1000 units every day  Diabetes Type II complicated by hypoglycemia, No results found for: GFR Hba1c goal less than 7, current Hba1c is  Lab Results  Component Value Date   HGBA1C 7.4 (H) 07/24/2024   Will recommend the following: Stop Tradjenta /Linagliptin  5 mg every day due to c/o lows Continue Metformin XR 500 mg once or twice a day as tolerated Discussed diet, given handouts to prevent over- and under- carb emails  Ordered diabetes education - write food notes for high and low blood sugars  Will send note to PCP per pt request   Mounjaro was dropping it low and ozempic was giving diarrhea  Jardiance caused purple painful knots on the vagina   09/04/24 LDL 71 Chol 192 Tg 284 HDL 64 VLDL 56.8 A1C 7.6% AST 27 Alt 29 GFR 75 Vit D 17.8   No known contraindications/side effects to any of above medications Glucagon discussed and prescribed with refills on  09/14/24  -Last LD and Tg are as follows: Lab Results  Component Value Date   LDLCALC 96 08/21/2024    Lab Results  Component Value Date   TRIG 204 (H) 08/21/2024   -On atorvastatin  10 mg twice a week  -Follow low fat diet and exercise   -Blood pressure goal <140/90 - Microalbumin/creatinine goal is < 30 -Last MA/Cr is as follows: No results found for: MICROALBUR, MALB24HUR -not on ACE/ARB  -diet changes including salt restriction -limit eating outside -counseled BP targets per standards of diabetes care -uncontrolled blood pressure can lead to retinopathy, nephropathy and cardiovascular and atherosclerotic heart disease  Reviewed and counseled on: -A1C target -Blood sugar targets -Complications of uncontrolled diabetes  -Checking blood sugar before meals and bedtime and bring log next visit -All medications with mechanism of action and side effects -Hypoglycemia management: rule of 15's, Glucagon Emergency Kit and medical alert ID -low-carb low-fat plate-method diet -At least 20 minutes of physical activity per day -Annual dilated retinal eye exam and foot exam -compliance and follow up needs -follow up as scheduled or earlier if problem gets worse  Call if blood sugar is less than 70 or consistently above 250    Take a 15 gm snack of carbohydrate at bedtime before you go to sleep if your blood sugar is less than 100.    If you are going to fast after midnight for a test or procedure, ask your physician for instructions on  how to reduce/decrease your insulin  dose.    Call if blood sugar is less than 70 or consistently above 250  -Treating a low sugar by rule of 15  (15 gms of sugar every 15 min until sugar is more than 70) If you feel your sugar is low, test your sugar to be sure If your sugar is low (less than 70), then take 15 grams of a fast acting Carbohydrate (3-4 glucose tablets or glucose gel or 4 ounces of juice or regular soda) Recheck your sugar 15 min  after treating low to make sure it is more than 70 If sugar is still less than 70, treat again with 15 grams of carbohydrate          Don't drive the hour of hypoglycemia  If unconscious/unable to eat or drink by mouth, use glucagon injection or nasal spray baqsimi and call 911. Can repeat again in 15 min if still unconscious.  Return in about 1 month (around 10/18/2024) for visit, labs today.   I have reviewed current medications, nurse's notes, allergies, vital signs, past medical and surgical history, family medical history, and social history for this encounter. Counseled patient on symptoms, examination findings, lab findings, imaging results, treatment decisions and monitoring and prognosis. The patient understood the recommendations and agrees with the treatment plan. All questions regarding treatment plan were fully answered.  Erica Birmingham, Cervantes  09/14/24    History of Present Illness Erica Cervantes is a 78 y.o. year old female who presents for evaluation of Type II diabetes mellitus.  Erica Cervantes was first diagnosed around 65.   Diabetes education +  Home diabetes regimen: Metformin XR 500 mg bid  Linagliptin  5 mg every day  COMPLICATIONS -  MI/Stroke -  retinopathy +  neuropathy -  nephropathy  BLOOD SUGAR DATA CGM interpretation: At today's visit, we reviewed her CGM downloads. The full report is scanned in the media. Reviewing the CGM trends, BG are high overnight but reported low overnight at times by patient, with variability to <70 and >300.  Given a new libre reader to pt, as seen issues with current one.   Physical Exam  BP 122/80   Pulse 85   Ht 5' 2 (1.575 m)   Wt 144 lb (65.3 kg)   SpO2 96%   BMI 26.34 kg/m    Constitutional: well developed, well nourished Head: normocephalic, atraumatic Eyes: sclera anicteric, no redness Neck: supple Lungs: normal respiratory effort Neurology: alert and oriented Skin: dry, no appreciable rashes Musculoskeletal: no  appreciable defects Psychiatric: normal mood and affect Diabetic Foot Exam - Simple   Simple Foot Form Diabetic Foot exam was performed with the following findings: Yes 09/14/2024  2:31 PM  Visual Inspection No deformities, no ulcerations, no other skin breakdown bilaterally: Yes Sensation Testing Intact to touch and monofilament testing bilaterally: Yes Pulse Check Posterior Tibialis and Dorsalis pulse intact bilaterally: Yes Comments      Current Medications Patient's Medications  New Prescriptions   VITAMIN D, ERGOCALCIFEROL, (DRISDOL) 1.25 MG (50000 UNIT) CAPS CAPSULE    Take 1 capsule (50,000 Units total) by mouth every 7 (seven) days.  Previous Medications   AMPHETAMINE -DEXTROAMPHETAMINE  (ADDERALL) 20 MG TABLET    Take 20-40 mg by mouth See admin instructions. Take 40 mg (2 tablets) by mouth in the morning and 20 mg (1 tablet) at 2 pm   ASPIRIN  EC 81 MG TABLET    Take 1 tablet (81 mg total) by mouth daily. Swallow whole.  BEMPEDOIC ACID  180 MG TABS    Take 1 tablet (180 mg total) by mouth daily.   BUSPIRONE  (BUSPAR ) 30 MG TABLET    Take 30 mg by mouth 3 (three) times daily.   CHOLECALCIFEROL (VITAMIN D3) 25 MCG (1000 UNIT) TABLET    Take 2,000 Units by mouth daily.   CYCLOSPORINE (RESTASIS) 0.05 % OPHTHALMIC EMULSION    Place 2 drops into both eyes 2 (two) times daily.   DICYCLOMINE HCL PO    Take 50 mg by mouth daily.   DONEPEZIL  (ARICEPT ) 5 MG TABLET    Take 5 mg by mouth at bedtime.   DULOXETINE  (CYMBALTA ) 60 MG CAPSULE    Take 120 mg by mouth daily at 12 noon.   FOLIC ACID  (FOLVITE ) 800 MCG TABLET    Take 800-2,400 mcg by mouth daily.   FUROSEMIDE  (LASIX ) 40 MG TABLET    Take 1 tablet (40 mg total) by mouth every Monday, Wednesday, and Friday.   GUAIFENESIN (MUCINEX MAXIMUM STRENGTH) 1200 MG TB12    Take 1,200 mg by mouth daily as needed (severe congestion).   LIDOCAINE  4 %    Place 1 patch onto the skin daily as needed (pain).   LINAGLIPTIN  (TRADJENTA ) 5 MG TABS TABLET     Take 1 tablet (5 mg total) by mouth daily.   METFORMIN (FORTAMET) 500 MG (OSM) 24 HR TABLET    Take 500 mg by mouth daily with breakfast.   METHOCARBAMOL  (ROBAXIN ) 500 MG TABLET    Take 1 tablet (500 mg total) by mouth every 8 (eight) hours as needed for muscle spasms.   METOPROLOL  SUCCINATE (TOPROL  XL) 25 MG 24 HR TABLET    Take 1 tablet (25 mg total) by mouth daily.   OMEPRAZOLE (PRILOSEC) 20 MG CAPSULE    Take 20 mg by mouth daily.   ONETOUCH VERIO TEST STRIP    1 each daily.   OXYCODONE  (ROXICODONE ) 5 MG IMMEDIATE RELEASE TABLET    Take 1 tablet (5 mg total) by mouth every 6 (six) hours as needed for severe pain (pain score 7-10).   SPIRONOLACTONE  (ALDACTONE ) 25 MG TABLET    Take 1 tablet (25 mg total) by mouth daily.   TRIAMCINOLONE  CREAM (KENALOG ) 0.1 %    Apply 1 application. topically 3 (three) times daily as needed for itching or rash.  Modified Medications   Modified Medication Previous Medication   ATORVASTATIN  (LIPITOR) 10 MG TABLET atorvastatin  (LIPITOR) 10 MG tablet      Take 1 tablet (10 mg total) by mouth every other day. Take 1 tablet by mouth every Sunday and Wednesday    Take 10 mg by mouth See admin instructions. Take 1 tablet by mouth every Sunday and Wednesday  Discontinued Medications   No medications on file    Allergies Allergies  Allergen Reactions   Sulfa Antibiotics Shortness Of Breath    Other Reaction(s): Not available   Ace Inhibitors Cough    Pt tolerates lisinopril    Codeine Itching and Swelling    Other Reaction(s): Not available   Ezetimibe     Myalgia  Other Reaction(s): Not available   Penicillins     Unknown reaction     Statins     Body aches  Other Reaction(s): Not available   Tramadol     Numbness  Other Reaction(s): Not available   Neosporin [Neomycin-Bacitracin Zn-Polymyx] Rash    Past Medical History Past Medical History:  Diagnosis Date   Chronic kidney disease  stage 4   Depression    Diabetes mellitus without  complication (HCC)    GERD (gastroesophageal reflux disease)    Hypertension    Neuromuscular disorder (HCC)    peripheral neuropathy    Past Surgical History Past Surgical History:  Procedure Laterality Date   ABDOMINAL HYSTERECTOMY  31   CHOLECYSTECTOMY     age 31   DILATION AND CURETTAGE OF UTERUS     x 4  1980   RIGHT/LEFT HEART CATH AND CORONARY ANGIOGRAPHY N/A 10/30/2022   Procedure: RIGHT/LEFT HEART CATH AND CORONARY ANGIOGRAPHY;  Surgeon: Cherrie Erica SAUNDERS, Cervantes;  Location: [facility];  Service: Cardiovascular;  Laterality: N/A;   TONSILLECTOMY     age 62   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   Procedure: TOTAL HIP ARTHROPLASTY ANTERIOR APPROACH;  Surgeon: Yvone Rush, Cervantes;  Location: [facility];  Service: Orthopedics;  Laterality: Right;    Family History family history includes Cancer in her father, mother, and another family member; Heart attack in her brother; Polycystic kidney disease in her brother.  Social History Social History   Socioeconomic History   Marital status: Single    Spouse name: Not on file   Number of children: Not on file   Years of education: Not on file   Highest education level: Not on file  Occupational History   Not on file  Tobacco Use   Smoking status: Never    Passive exposure: Never   Smokeless tobacco: Never  Vaping Use   Vaping status: Never Used  Substance and Sexual Activity   Alcohol  use: No   Drug use: No   Sexual activity: Not on file  Other Topics Concern   Not on file  Social History Narrative   Not on file   Social Drivers of Health   Financial Resource Strain: Not on file  Food Insecurity: No Food Insecurity (07/24/2024)   Hunger Vital Sign    Worried About Running Out of Food in the Last Year: Never true    Ran Out of Food in the Last Year: Never true  Transportation Needs: No Transportation Needs (07/24/2024)   PRAPARE - Administrator, Civil Service (Medical): No    Lack of  Transportation (Non-Medical): No  Physical Activity: Not on file  Stress: Not on file  Social Connections: Socially Isolated (07/24/2024)   Social Connection and Isolation Panel    Frequency of Communication with Friends and Family: Three times a week    Frequency of Social Gatherings with Friends and Family: Once a week    Attends Religious Services: Patient declined    Active Member of Clubs or Organizations: No    Attends Banker Meetings: Never    Marital Status: Divorced  Catering Manager Violence: Not At Risk (07/24/2024)   Humiliation, Afraid, Rape, and Kick questionnaire    Fear of Current or Ex-Partner: No    Emotionally Abused: No    Physically Abused: No    Sexually Abused: No    Lab Results  Component Value Date   HGBA1C 7.4 (H) 07/24/2024   HGBA1C 7.7 (H) 10/30/2022   HGBA1C 8.0 (H) 02/03/2022   Lab Results  Component Value Date   CHOL 190 08/21/2024   Lab Results  Component Value Date   HDL 59 08/21/2024   Lab Results  Component Value Date   LDLCALC 96 08/21/2024   Lab Results  Component Value Date   TRIG 204 (H) 08/21/2024   Lab Results  Component Value Date   CHOLHDL 3.2 08/21/2024   Lab Results  Component Value Date   CREATININE 1.02 (H) 08/07/2024   No results found for: GFR No results found for: MACKEY CURRENT    Component Value Date/Time   NA 140 08/07/2024 1410   NA 140 12/13/2023 1220   K 4.0 08/07/2024 1410   CL 103 08/07/2024 1410   CO2 25 08/07/2024 1410   GLUCOSE 154 (H) 08/07/2024 1410   BUN 28 (H) 08/07/2024 1410   BUN 16 12/13/2023 1220   CREATININE 1.02 (H) 08/07/2024 1410   CALCIUM  9.3 08/07/2024 1410   PROT 6.8 07/24/2024 1640   ALBUMIN 3.5 07/24/2024 1640   AST 35 07/24/2024 1640   ALT 75 (H) 07/24/2024 1640   ALKPHOS 120 07/24/2024 1640   BILITOT 0.7 07/24/2024 1640   GFRNONAA 56 (L) 08/07/2024 1410      Latest Ref Rng & Units 08/07/2024    2:10 PM 07/26/2024    7:49 AM 07/25/2024     3:19 AM  BMP  Glucose 70 - 99 mg/dL 845  829  689   BUN 8 - 23 mg/dL 28  34  49   Creatinine 0.44 - 1.00 mg/dL 8.97  8.82  8.73   Sodium 135 - 145 mmol/L 140  136  134   Potassium 3.5 - 5.1 mmol/L 4.0  4.1  3.7   Chloride 98 - 111 mmol/L 103  102  100   CO2 22 - 32 mmol/L 25  23  22    Calcium  8.9 - 10.3 mg/dL 9.3  89.9  9.4        Component Value Date/Time   WBC 10.1 08/18/2024 1057   RBC 4.45 08/18/2024 1057   HGB 13.7 08/18/2024 1057   HCT 43.2 08/18/2024 1057   PLT 349 08/18/2024 1057   MCV 97.1 08/18/2024 1057   MCH 30.8 08/18/2024 1057   MCHC 31.7 08/18/2024 1057   RDW 13.3 08/18/2024 1057   LYMPHSABS 1.9 08/18/2024 1057   MONOABS 0.8 08/18/2024 1057   EOSABS 0.2 08/18/2024 1057   BASOSABS 0.1 08/18/2024 1057     Parts of this note may have been dictated using voice recognition software. There may be variances in spelling and vocabulary which are unintentional. Not all errors are proofread. Please notify the dino if any discrepancies are noted or if the meaning of any statement is not clear.

## 2024-09-15 LAB — MICROALBUMIN / CREATININE URINE RATIO
Creatinine, Urine: 167 mg/dL (ref 20–275)
Microalb Creat Ratio: 41 mg/g{creat} — ABNORMAL HIGH (ref <30)
Microalb, Ur: 6.8 mg/dL

## 2024-09-18 ENCOUNTER — Inpatient Hospital Stay: Attending: Oncology

## 2024-09-18 VITALS — BP 145/78 | HR 64 | Temp 96.6°F | Resp 18

## 2024-09-18 DIAGNOSIS — D509 Iron deficiency anemia, unspecified: Secondary | ICD-10-CM

## 2024-09-18 DIAGNOSIS — E538 Deficiency of other specified B group vitamins: Secondary | ICD-10-CM | POA: Insufficient documentation

## 2024-09-18 MED ORDER — CYANOCOBALAMIN 1000 MCG/ML IJ SOLN
1000.0000 ug | Freq: Once | INTRAMUSCULAR | Status: AC
  Administered 2024-09-18: 1000 ug via INTRAMUSCULAR
  Filled 2024-09-18: qty 1

## 2024-09-18 NOTE — Progress Notes (Signed)
 Patient tolerated B12 injection with no complaints voiced.  Site clean and dry with no bruising or swelling noted at site.  See MAR for details.  Band aid applied.  Patient stable during and after injection.  Vss with discharge and left in satisfactory condition with no s/s of distress noted.

## 2024-09-19 DIAGNOSIS — D509 Iron deficiency anemia, unspecified: Secondary | ICD-10-CM | POA: Diagnosis not present

## 2024-09-19 DIAGNOSIS — K2100 Gastro-esophageal reflux disease with esophagitis, without bleeding: Secondary | ICD-10-CM | POA: Diagnosis not present

## 2024-09-19 DIAGNOSIS — E1122 Type 2 diabetes mellitus with diabetic chronic kidney disease: Secondary | ICD-10-CM | POA: Diagnosis not present

## 2024-09-19 DIAGNOSIS — F331 Major depressive disorder, recurrent, moderate: Secondary | ICD-10-CM | POA: Diagnosis not present

## 2024-09-19 DIAGNOSIS — E782 Mixed hyperlipidemia: Secondary | ICD-10-CM | POA: Diagnosis not present

## 2024-09-19 DIAGNOSIS — E559 Vitamin D deficiency, unspecified: Secondary | ICD-10-CM | POA: Diagnosis not present

## 2024-09-19 DIAGNOSIS — G629 Polyneuropathy, unspecified: Secondary | ICD-10-CM | POA: Diagnosis not present

## 2024-09-19 DIAGNOSIS — I5020 Unspecified systolic (congestive) heart failure: Secondary | ICD-10-CM | POA: Diagnosis not present

## 2024-09-19 DIAGNOSIS — F424 Excoriation (skin-picking) disorder: Secondary | ICD-10-CM | POA: Diagnosis not present

## 2024-09-19 DIAGNOSIS — I10 Essential (primary) hypertension: Secondary | ICD-10-CM | POA: Diagnosis not present

## 2024-09-19 DIAGNOSIS — Z96641 Presence of right artificial hip joint: Secondary | ICD-10-CM | POA: Diagnosis not present

## 2024-09-19 DIAGNOSIS — R413 Other amnesia: Secondary | ICD-10-CM | POA: Diagnosis not present

## 2024-09-21 ENCOUNTER — Other Ambulatory Visit (HOSPITAL_COMMUNITY): Payer: Self-pay

## 2024-09-27 ENCOUNTER — Ambulatory Visit (INDEPENDENT_AMBULATORY_CARE_PROVIDER_SITE_OTHER): Admitting: Gastroenterology

## 2024-10-09 ENCOUNTER — Encounter: Payer: Self-pay | Admitting: *Deleted

## 2024-10-20 ENCOUNTER — Inpatient Hospital Stay: Attending: Hematology

## 2024-10-20 VITALS — BP 120/71 | HR 68 | Resp 16

## 2024-10-20 DIAGNOSIS — E538 Deficiency of other specified B group vitamins: Secondary | ICD-10-CM | POA: Diagnosis present

## 2024-10-20 DIAGNOSIS — D509 Iron deficiency anemia, unspecified: Secondary | ICD-10-CM

## 2024-10-20 MED ORDER — CYANOCOBALAMIN 1000 MCG/ML IJ SOLN
1000.0000 ug | Freq: Once | INTRAMUSCULAR | Status: AC
  Administered 2024-10-20: 1000 ug via INTRAMUSCULAR
  Filled 2024-10-20: qty 1

## 2024-10-20 NOTE — Patient Instructions (Signed)
 Vitamin B12 Injection What is this medication? Vitamin B12 (VAHY tuh min B12) prevents and treats low vitamin B12 levels in your body. It is used in people who do not get enough vitamin B12 from their diet or when their digestive tract does not absorb enough. Vitamin B12 plays an important role in maintaining the health of your nervous system and red blood cells. This medicine may be used for other purposes; ask your health care provider or pharmacist if you have questions. COMMON BRAND NAME(S): B-12 Compliance Kit, B-12 Injection Kit, Cyomin, Dodex , LA-12, Nutri-Twelve, Physicians EZ Use B-12, Primabalt, Vitamin Deficiency Injectable System - B12 What should I tell my care team before I take this medication? They need to know if you have any of these conditions: Kidney disease Leber's disease Megaloblastic anemia An unusual or allergic reaction to cyanocobalamin , cobalt, other medications, foods, dyes, or preservatives Pregnant or trying to get pregnant Breast-feeding How should I use this medication? This medication is injected into a muscle or deeply under the skin. It is usually given in a clinic or care team's office. However, your care team may teach you how to inject yourself. Follow all instructions. Talk to your care team about the use of this medication in children. Special care may be needed. Overdosage: If you think you have taken too much of this medicine contact a poison control center or emergency room at once. NOTE: This medicine is only for you. Do not share this medicine with others. What if I miss a dose? If you are given your dose at a clinic or care team's office, call to reschedule your appointment. If you give your own injections, and you miss a dose, take it as soon as you can. If it is almost time for your next dose, take only that dose. Do not take double or extra doses. What may interact with this medication? Alcohol Colchicine This list may not describe all possible  interactions. Give your health care provider a list of all the medicines, herbs, non-prescription drugs, or dietary supplements you use. Also tell them if you smoke, drink alcohol, or use illegal drugs. Some items may interact with your medicine. What should I watch for while using this medication? Visit your care team regularly. You may need blood work done while you are taking this medication. You may need to follow a special diet. Talk to your care team. Limit your alcohol intake and avoid smoking to get the best benefit. What side effects may I notice from receiving this medication? Side effects that you should report to your care team as soon as possible: Allergic reactions--skin rash, itching, hives, swelling of the face, lips, tongue, or throat Swelling of the ankles, hands, or feet Trouble breathing Side effects that usually do not require medical attention (report to your care team if they continue or are bothersome): Diarrhea This list may not describe all possible side effects. Call your doctor for medical advice about side effects. You may report side effects to FDA at 1-800-FDA-1088. Where should I keep my medication? Keep out of the reach of children. Store at room temperature between 15 and 30 degrees C (59 and 85 degrees F). Protect from light. Throw away any unused medication after the expiration date. NOTE: This sheet is a summary. It may not cover all possible information. If you have questions about this medicine, talk to your doctor, pharmacist, or health care provider.  2024 Elsevier/Gold Standard (2021-06-10 00:00:00)

## 2024-10-20 NOTE — Progress Notes (Signed)
 Patient tolerated B12 injection with no complaints voiced.  Site clean and dry with no bruising or swelling noted at site.  See MAR for details.  Band aid applied.  Patient stable during and after injection.  Vss with discharge and left in satisfactory condition with no s/s of distress noted. All follow ups as scheduled.   Erica Cervantes

## 2024-10-24 ENCOUNTER — Ambulatory Visit: Admitting: "Endocrinology

## 2024-11-01 ENCOUNTER — Ambulatory Visit (INDEPENDENT_AMBULATORY_CARE_PROVIDER_SITE_OTHER): Admitting: Gastroenterology

## 2024-11-01 ENCOUNTER — Encounter (INDEPENDENT_AMBULATORY_CARE_PROVIDER_SITE_OTHER): Payer: Self-pay | Admitting: Gastroenterology

## 2024-11-01 VITALS — BP 127/79 | HR 76 | Temp 96.4°F | Ht 63.0 in | Wt 142.6 lb

## 2024-11-01 DIAGNOSIS — K5900 Constipation, unspecified: Secondary | ICD-10-CM | POA: Diagnosis not present

## 2024-11-01 DIAGNOSIS — K5904 Chronic idiopathic constipation: Secondary | ICD-10-CM | POA: Insufficient documentation

## 2024-11-01 DIAGNOSIS — D509 Iron deficiency anemia, unspecified: Secondary | ICD-10-CM

## 2024-11-01 NOTE — Progress Notes (Signed)
 Macklyn Glandon Faizan Venie Montesinos , M.D. Gastroenterology & Hepatology [facility] Gastroenterology [address] [place], [place] [address] Primary Care Physician: Toribio Jerel MATSU, MD [address] [place] [address]   History of Present Illness: Erica Cervantes is a 79 y.o. female with  CKD stage III, depression, DM, GERD, HTN, peripheral, neuropathy, CHF who presents for evaluation of Iron deficiency anemia   Patient was last seen by [facility] 01/2023 for iron deficiency anemia where her procedure was not performed due to significant cardiac disease.  Recent echo with improving EF  Patient was last seen by cardiology and hematology 08/2024 had received 2 iron infusions with improvement of anemia.  Patient heart function has significantly improved as well The patient denies having any nausea, vomiting, fever, chills, hematochezia, melena, hematemesis, abdominal distention, abdominal pain, diarrhea, jaundice, pruritus or weight loss.  Labs from 05/22/2024 hemoglobin 13.7 platelets 349 Ferritin 234 iron saturation 30%  NSAID use: none currently  Social hx:no etoh or tobacco  Fam hx: no CRC or liver disease   Last Colonoscopy: atleast 10 years ago, normal per patient. Last Endoscopy: never    ECHO 07/2024  IMPRESSIONS     1. Left ventricular ejection fraction, by estimation, is 50 to 55%. The  left ventricle has low normal function. The left ventricle has no regional  wall motion abnormalities. Left ventricular diastolic parameters are  consistent with Grade I diastolic  dysfunction (impaired relaxation).   2. Right ventricular systolic function is normal. The right ventricular  size is normal.   3. The mitral valve is normal in structure. Trivial mitral valve  regurgitation. No evidence of mitral stenosis.   4. The aortic valve is tricuspid. Aortic valve regurgitation is trivial.  No aortic stenosis is present.   5. The inferior vena cava is normal in size  with greater than 50%  respiratory variability, suggesting right atrial pressure of 3 mmHg.   Past Medical History: Past Medical History:  Diagnosis Date   Chronic kidney disease    stage 4   Depression    Diabetes mellitus without complication (HCC)    GERD (gastroesophageal reflux disease)    Hypertension    Neuromuscular disorder (HCC)    peripheral neuropathy    Past Surgical History: Past Surgical History:  Procedure Laterality Date   ABDOMINAL HYSTERECTOMY  10   CHOLECYSTECTOMY     age 85   DILATION AND CURETTAGE OF UTERUS     x 4  1980   RIGHT/LEFT HEART CATH AND CORONARY ANGIOGRAPHY N/A 10/30/2022   Procedure: RIGHT/LEFT HEART CATH AND CORONARY ANGIOGRAPHY;  Surgeon: Cherrie Toribio SAUNDERS, MD;  Location: [facility] INVASIVE CV LAB;  Service: Cardiovascular;  Laterality: N/A;   TONSILLECTOMY     age 9   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   Procedure: TOTAL HIP ARTHROPLASTY ANTERIOR APPROACH;  Surgeon: Yvone Rush, MD;  Location: [facility] ORS;  Service: Orthopedics;  Laterality: Right;    Family History: Family History  Problem Relation Age of Onset   Cancer Mother    Cancer Father    Heart attack Brother    Polycystic kidney disease Brother    Cancer Other     Social History:Tobacco Use History[1] Social History   Substance and Sexual Activity  Alcohol  Use No   Social History   Substance and Sexual Activity  Drug Use No    Allergies: Allergies[2]  Medications: Current Outpatient Medications  Medication Sig Dispense Refill   amphetamine -dextroamphetamine  (ADDERALL)  20 MG tablet Take 20-40 mg by mouth See admin instructions. Take 40 mg (2 tablets) by mouth in the morning and 20 mg (1 tablet) at 2 pm     aspirin  EC 81 MG tablet Take 1 tablet (81 mg total) by mouth daily. Swallow whole. 30 tablet 12   atorvastatin  (LIPITOR) 10 MG tablet Take 1 tablet (10 mg total) by mouth every other day. Take 1 tablet by mouth every Sunday and Wednesday 90 tablet 1    Bempedoic Acid  180 MG TABS Take 1 tablet (180 mg total) by mouth daily. 90 tablet 3   busPIRone  (BUSPAR ) 30 MG tablet Take 30 mg by mouth 3 (three) times daily.     cholecalciferol (VITAMIN D3) 25 MCG (1000 UNIT) tablet Take 2,000 Units by mouth daily.     cycloSPORINE (RESTASIS) 0.05 % ophthalmic emulsion Place 2 drops into both eyes 2 (two) times daily.     DICYCLOMINE HCL PO Take 50 mg by mouth daily.     donepezil  (ARICEPT ) 5 MG tablet Take 5 mg by mouth at bedtime.     DULoxetine  (CYMBALTA ) 60 MG capsule Take 120 mg by mouth daily at 12 noon.     folic acid  (FOLVITE ) 800 MCG tablet Take 800-2,400 mcg by mouth daily.     furosemide  (LASIX ) 40 MG tablet Take 1 tablet (40 mg total) by mouth every Monday, Wednesday, and Friday. 45 tablet 3   Guaifenesin (MUCINEX MAXIMUM STRENGTH) 1200 MG TB12 Take 1,200 mg by mouth daily as needed (severe congestion).     lidocaine  4 % Place 1 patch onto the skin daily as needed (pain). 14 patch 0   linagliptin  (TRADJENTA ) 5 MG TABS tablet Take 1 tablet (5 mg total) by mouth daily. 30 tablet 0   metformin (FORTAMET) 500 MG (OSM) 24 hr tablet Take 500 mg by mouth daily with breakfast.     methocarbamol  (ROBAXIN ) 500 MG tablet Take 1 tablet (500 mg total) by mouth every 8 (eight) hours as needed for muscle spasms. 30 tablet 0   metoprolol  succinate (TOPROL  XL) 25 MG 24 hr tablet Take 1 tablet (25 mg total) by mouth daily. 60 tablet 5   omeprazole (PRILOSEC) 20 MG capsule Take 20 mg by mouth daily.     ONETOUCH VERIO test strip 1 each daily.     oxyCODONE  (ROXICODONE ) 5 MG immediate release tablet Take 1 tablet (5 mg total) by mouth every 6 (six) hours as needed for severe pain (pain score 7-10). 20 tablet 0   spironolactone  (ALDACTONE ) 25 MG tablet Take 1 tablet (25 mg total) by mouth daily. 30 tablet 0   triamcinolone  cream (KENALOG ) 0.1 % Apply 1 application. topically 3 (three) times daily as needed for itching or rash.     Vitamin D , Ergocalciferol , (DRISDOL )  1.25 MG (50000 UNIT) CAPS capsule Take 1 capsule (50,000 Units total) by mouth every 7 (seven) days. 12 capsule 0   No current facility-administered medications for this visit.    Review of Systems: GENERAL: negative for malaise, night sweats HEENT: No changes in hearing or vision, no nose bleeds or other nasal problems. NECK: Negative for lumps, goiter, pain and significant neck swelling RESPIRATORY: Negative for cough, wheezing CARDIOVASCULAR: Negative for chest pain, leg swelling, palpitations, orthopnea GI: SEE HPI MUSCULOSKELETAL: Negative for joint pain or swelling, back pain, and muscle pain. SKIN: Negative for lesions, rash HEMATOLOGY Negative for prolonged bleeding, bruising easily, and swollen nodes. ENDOCRINE: Negative for cold or heat intolerance, polyuria, polydipsia and  goiter. NEURO: negative for tremor, gait imbalance, syncope and seizures. The remainder of the review of systems is noncontributory.   Physical Exam: BP 127/79   Pulse 76   Temp (!) 96.4 F (35.8 C)   Ht 5' 3 (1.6 m)   Wt 142 lb 9.6 oz (64.7 kg)   BMI 25.26 kg/m  GENERAL: The patient is AO x3, in no acute distress. HEENT: Head is normocephalic and atraumatic. EOMI are intact. Mouth is well hydrated and without lesions. NECK: Supple. No masses LUNGS: Clear to auscultation. No presence of rhonchi/wheezing/rales. Adequate chest expansion HEART: RRR, normal s1 and s2. ABDOMEN: Soft, nontender, no guarding, no peritoneal signs, and nondistended. BS +. No masses.  Imaging/Labs: as above     Latest Ref Rng & Units 08/18/2024   10:57 AM 07/26/2024    7:49 AM 07/25/2024    3:19 AM  CBC  WBC 4.0 - 10.5 K/uL 10.1  9.9  9.1   Hemoglobin 12.0 - 15.0 g/dL 86.2  86.6  87.1   Hematocrit 36.0 - 46.0 % 43.2  41.1  38.6   Platelets 150 - 400 K/uL 349  269  268    Lab Results  Component Value Date   IRON 79 08/18/2024   TIBC 262 08/18/2024   FERRITIN 234 08/18/2024    I personally reviewed and  interpreted the available labs, imaging and endoscopic files.  Impression and Plan:  Erica Cervantes is a 79 y.o. female with  CKD stage III, depression, DM, GERD, HTN, peripheral, neuropathy, CHF who presents for evaluation of Iron deficiency anemia   #Iron deficiency Anemia-resolved  Patient had received 2 iron infusions and her hemoglobin and iron levels have significantly improved.  Currently is not on any oral iron therapy.  Patient is currently asymptomatic does not have any GI complaints except intermittent constipation  Last colonoscopy over 10 years ago  I discussed with patient clinical indication risk-benefit and limitation of upper endoscopy and colonoscopy since at one point she had iron deficiency anemia which has resolved now  Patient would like to defer endoscopic intervention at this time given advanced age, cardiac comorbidities with risk of intraprocedural adverse effects from anesthesia  I discussed with patient that without upper endoscopy and colonoscopy I cannot completely rule out any underlying lesion such as polyps or malignancy/cancer  Patient does verbalizes understanding and will reach out back to us  if she has any overt bleeding, drop in hemoglobin/iron levels or any other symptoms  #Constipation  Patient has intermittent constipation, diet is low in fluid and fiber Follow a high fiber diet: Include foods such as dates, prunes, pears, and kiwi. Use Metamucil twice a day.  Can also take MiraLAX    All questions were answered.      Erica Laye Faizan Skarleth Delmonico, MD Gastroenterology and Hepatology [facility] Gastroenterology   This chart has been completed using [facility] Dictation software, and while attempts have been made to ensure accuracy , certain words and phrases may not be transcribed as intended      [1]  Social History Tobacco Use  Smoking Status Never   Passive exposure: Never  Smokeless Tobacco Never  [2]  Allergies Allergen  Reactions   Sulfa Antibiotics Shortness Of Breath    Other Reaction(s): Not available   Ace Inhibitors Cough    Pt tolerates lisinopril    Codeine Itching and Swelling    Other Reaction(s): Not available   Ezetimibe     Myalgia  Other Reaction(s): Not available  Penicillins     Unknown reaction     Statins     Body aches  Other Reaction(s): Not available   Tramadol     Numbness  Other Reaction(s): Not available   Neosporin [Neomycin-Bacitracin Zn-Polymyx] Rash

## 2024-11-20 ENCOUNTER — Inpatient Hospital Stay: Attending: Hematology

## 2024-12-12 ENCOUNTER — Inpatient Hospital Stay: Attending: Hematology

## 2024-12-19 ENCOUNTER — Inpatient Hospital Stay: Admitting: Oncology

## 2024-12-19 ENCOUNTER — Inpatient Hospital Stay
